# Patient Record
Sex: Female | Born: 1935 | Race: White | Hispanic: No | Smoking: Current every day smoker
Health system: Southern US, Community
[De-identification: ages and names within clinical notes are randomized; demographics above are authoritative.]

## PROBLEM LIST (undated history)

## (undated) DIAGNOSIS — I739 Peripheral vascular disease, unspecified: Secondary | ICD-10-CM

## (undated) DIAGNOSIS — Z8709 Personal history of other diseases of the respiratory system: Secondary | ICD-10-CM

## (undated) DIAGNOSIS — S069X9A Unspecified intracranial injury with loss of consciousness of unspecified duration, initial encounter: Secondary | ICD-10-CM

## (undated) DIAGNOSIS — J189 Pneumonia, unspecified organism: Secondary | ICD-10-CM

## (undated) DIAGNOSIS — Z950 Presence of cardiac pacemaker: Secondary | ICD-10-CM

## (undated) DIAGNOSIS — F419 Anxiety disorder, unspecified: Secondary | ICD-10-CM

## (undated) DIAGNOSIS — H9190 Unspecified hearing loss, unspecified ear: Secondary | ICD-10-CM

## (undated) DIAGNOSIS — I2510 Atherosclerotic heart disease of native coronary artery without angina pectoris: Secondary | ICD-10-CM

## (undated) DIAGNOSIS — N183 Chronic kidney disease, stage 3 unspecified: Secondary | ICD-10-CM

## (undated) DIAGNOSIS — K219 Gastro-esophageal reflux disease without esophagitis: Secondary | ICD-10-CM

## (undated) DIAGNOSIS — F329 Major depressive disorder, single episode, unspecified: Secondary | ICD-10-CM

## (undated) DIAGNOSIS — E1129 Type 2 diabetes mellitus with other diabetic kidney complication: Secondary | ICD-10-CM

## (undated) DIAGNOSIS — I779 Disorder of arteries and arterioles, unspecified: Secondary | ICD-10-CM

## (undated) DIAGNOSIS — Z8719 Personal history of other diseases of the digestive system: Secondary | ICD-10-CM

## (undated) DIAGNOSIS — I10 Essential (primary) hypertension: Secondary | ICD-10-CM

## (undated) DIAGNOSIS — J449 Chronic obstructive pulmonary disease, unspecified: Secondary | ICD-10-CM

## (undated) DIAGNOSIS — M1990 Unspecified osteoarthritis, unspecified site: Secondary | ICD-10-CM

## (undated) DIAGNOSIS — E785 Hyperlipidemia, unspecified: Secondary | ICD-10-CM

## (undated) DIAGNOSIS — F32A Depression, unspecified: Secondary | ICD-10-CM

## (undated) DIAGNOSIS — R0600 Dyspnea, unspecified: Secondary | ICD-10-CM

## (undated) DIAGNOSIS — I4819 Other persistent atrial fibrillation: Secondary | ICD-10-CM

## (undated) DIAGNOSIS — I5032 Chronic diastolic (congestive) heart failure: Secondary | ICD-10-CM

## (undated) DIAGNOSIS — K5900 Constipation, unspecified: Secondary | ICD-10-CM

## (undated) HISTORY — PX: OTHER SURGICAL HISTORY: SHX169

## (undated) HISTORY — PX: APPENDECTOMY: SHX54

## (undated) HISTORY — PX: JOINT REPLACEMENT: SHX530

## (undated) HISTORY — PX: CAROTID ENDARTERECTOMY: SUR193

## (undated) HISTORY — PX: KNEE SURGERY: SHX244

## (undated) HISTORY — PX: CORONARY ARTERY BYPASS GRAFT: SHX141

## (undated) HISTORY — DX: Other persistent atrial fibrillation: I48.19

## (undated) HISTORY — PX: CHOLECYSTECTOMY: SHX55

## (undated) HISTORY — PX: INSERT / REPLACE / REMOVE PACEMAKER: SUR710

## (undated) HISTORY — DX: Type 2 diabetes mellitus with other diabetic kidney complication: E11.29

## (undated) HISTORY — DX: Chronic diastolic (congestive) heart failure: I50.32

## (undated) HISTORY — PX: ABDOMINAL HYSTERECTOMY: SHX81

## (undated) HISTORY — PX: BREAST SURGERY: SHX581

## (undated) HISTORY — PX: HIP SURGERY: SHX245

## (undated) HISTORY — PX: CARDIAC CATHETERIZATION: SHX172

---

## 2003-03-03 DIAGNOSIS — S069X1A Unspecified intracranial injury with loss of consciousness of 30 minutes or less, initial encounter: Secondary | ICD-10-CM

## 2003-03-03 DIAGNOSIS — S069X9A Unspecified intracranial injury with loss of consciousness of unspecified duration, initial encounter: Secondary | ICD-10-CM

## 2003-03-03 HISTORY — PX: ORIF WRIST FRACTURE: SHX2133

## 2003-03-03 HISTORY — DX: Unspecified intracranial injury with loss of consciousness of 30 minutes or less, initial encounter: S06.9X1A

## 2003-03-03 HISTORY — PX: TOTAL HIP ARTHROPLASTY: SHX124

## 2003-03-03 HISTORY — DX: Unspecified intracranial injury with loss of consciousness of unspecified duration, initial encounter: S06.9X9A

## 2004-03-02 HISTORY — PX: OTHER SURGICAL HISTORY: SHX169

## 2004-03-02 HISTORY — PX: HIP ARTHROPLASTY: SHX981

## 2004-04-30 ENCOUNTER — Emergency Department: Payer: Self-pay | Admitting: Emergency Medicine

## 2013-03-02 HISTORY — PX: EYE SURGERY: SHX253

## 2014-03-15 DIAGNOSIS — Z79891 Long term (current) use of opiate analgesic: Secondary | ICD-10-CM | POA: Diagnosis not present

## 2014-03-15 DIAGNOSIS — I129 Hypertensive chronic kidney disease with stage 1 through stage 4 chronic kidney disease, or unspecified chronic kidney disease: Secondary | ICD-10-CM | POA: Diagnosis not present

## 2014-03-15 DIAGNOSIS — M47816 Spondylosis without myelopathy or radiculopathy, lumbar region: Secondary | ICD-10-CM | POA: Diagnosis not present

## 2014-03-15 DIAGNOSIS — Z1231 Encounter for screening mammogram for malignant neoplasm of breast: Secondary | ICD-10-CM | POA: Diagnosis not present

## 2014-03-15 DIAGNOSIS — E119 Type 2 diabetes mellitus without complications: Secondary | ICD-10-CM | POA: Diagnosis not present

## 2014-03-21 DIAGNOSIS — Z1231 Encounter for screening mammogram for malignant neoplasm of breast: Secondary | ICD-10-CM | POA: Diagnosis not present

## 2014-04-12 DIAGNOSIS — I480 Paroxysmal atrial fibrillation: Secondary | ICD-10-CM | POA: Diagnosis not present

## 2014-04-12 DIAGNOSIS — N183 Chronic kidney disease, stage 3 (moderate): Secondary | ICD-10-CM | POA: Diagnosis not present

## 2014-04-12 DIAGNOSIS — Z950 Presence of cardiac pacemaker: Secondary | ICD-10-CM | POA: Diagnosis not present

## 2014-06-14 DIAGNOSIS — N183 Chronic kidney disease, stage 3 (moderate): Secondary | ICD-10-CM | POA: Diagnosis not present

## 2014-06-14 DIAGNOSIS — I10 Essential (primary) hypertension: Secondary | ICD-10-CM | POA: Diagnosis not present

## 2014-06-14 DIAGNOSIS — R809 Proteinuria, unspecified: Secondary | ICD-10-CM | POA: Diagnosis not present

## 2014-06-25 DIAGNOSIS — M47816 Spondylosis without myelopathy or radiculopathy, lumbar region: Secondary | ICD-10-CM | POA: Diagnosis not present

## 2014-06-25 DIAGNOSIS — I10 Essential (primary) hypertension: Secondary | ICD-10-CM | POA: Diagnosis not present

## 2014-06-25 DIAGNOSIS — I739 Peripheral vascular disease, unspecified: Secondary | ICD-10-CM | POA: Diagnosis not present

## 2014-06-25 DIAGNOSIS — I2510 Atherosclerotic heart disease of native coronary artery without angina pectoris: Secondary | ICD-10-CM | POA: Diagnosis not present

## 2014-06-25 DIAGNOSIS — F419 Anxiety disorder, unspecified: Secondary | ICD-10-CM | POA: Diagnosis not present

## 2014-06-25 DIAGNOSIS — N183 Chronic kidney disease, stage 3 (moderate): Secondary | ICD-10-CM | POA: Diagnosis not present

## 2014-06-25 DIAGNOSIS — I129 Hypertensive chronic kidney disease with stage 1 through stage 4 chronic kidney disease, or unspecified chronic kidney disease: Secondary | ICD-10-CM | POA: Diagnosis not present

## 2014-06-25 DIAGNOSIS — E119 Type 2 diabetes mellitus without complications: Secondary | ICD-10-CM | POA: Diagnosis not present

## 2014-06-25 DIAGNOSIS — K219 Gastro-esophageal reflux disease without esophagitis: Secondary | ICD-10-CM | POA: Diagnosis not present

## 2014-06-25 DIAGNOSIS — I480 Paroxysmal atrial fibrillation: Secondary | ICD-10-CM | POA: Diagnosis not present

## 2014-06-25 DIAGNOSIS — Z79891 Long term (current) use of opiate analgesic: Secondary | ICD-10-CM | POA: Diagnosis not present

## 2014-06-25 DIAGNOSIS — E785 Hyperlipidemia, unspecified: Secondary | ICD-10-CM | POA: Diagnosis not present

## 2014-07-12 DIAGNOSIS — Z951 Presence of aortocoronary bypass graft: Secondary | ICD-10-CM | POA: Diagnosis not present

## 2014-07-12 DIAGNOSIS — I2510 Atherosclerotic heart disease of native coronary artery without angina pectoris: Secondary | ICD-10-CM | POA: Diagnosis not present

## 2014-07-12 DIAGNOSIS — I779 Disorder of arteries and arterioles, unspecified: Secondary | ICD-10-CM | POA: Diagnosis not present

## 2014-07-12 DIAGNOSIS — I480 Paroxysmal atrial fibrillation: Secondary | ICD-10-CM | POA: Diagnosis not present

## 2014-07-12 DIAGNOSIS — I10 Essential (primary) hypertension: Secondary | ICD-10-CM | POA: Diagnosis not present

## 2014-07-12 DIAGNOSIS — E785 Hyperlipidemia, unspecified: Secondary | ICD-10-CM | POA: Diagnosis not present

## 2014-07-26 DIAGNOSIS — I480 Paroxysmal atrial fibrillation: Secondary | ICD-10-CM | POA: Diagnosis not present

## 2014-07-26 DIAGNOSIS — Z950 Presence of cardiac pacemaker: Secondary | ICD-10-CM | POA: Diagnosis not present

## 2014-07-27 DIAGNOSIS — H1859 Other hereditary corneal dystrophies: Secondary | ICD-10-CM | POA: Diagnosis not present

## 2014-08-30 DIAGNOSIS — I10 Essential (primary) hypertension: Secondary | ICD-10-CM | POA: Diagnosis not present

## 2014-08-30 DIAGNOSIS — I480 Paroxysmal atrial fibrillation: Secondary | ICD-10-CM | POA: Diagnosis not present

## 2014-08-30 DIAGNOSIS — Z951 Presence of aortocoronary bypass graft: Secondary | ICD-10-CM | POA: Diagnosis not present

## 2014-08-30 DIAGNOSIS — I2510 Atherosclerotic heart disease of native coronary artery without angina pectoris: Secondary | ICD-10-CM | POA: Diagnosis not present

## 2014-08-30 DIAGNOSIS — Z950 Presence of cardiac pacemaker: Secondary | ICD-10-CM | POA: Diagnosis not present

## 2014-08-30 DIAGNOSIS — I779 Disorder of arteries and arterioles, unspecified: Secondary | ICD-10-CM | POA: Diagnosis not present

## 2014-08-30 DIAGNOSIS — E785 Hyperlipidemia, unspecified: Secondary | ICD-10-CM | POA: Diagnosis not present

## 2014-08-30 DIAGNOSIS — T887XXA Unspecified adverse effect of drug or medicament, initial encounter: Secondary | ICD-10-CM | POA: Diagnosis not present

## 2014-08-30 DIAGNOSIS — Z79899 Other long term (current) drug therapy: Secondary | ICD-10-CM | POA: Diagnosis not present

## 2014-09-24 DIAGNOSIS — M47816 Spondylosis without myelopathy or radiculopathy, lumbar region: Secondary | ICD-10-CM | POA: Diagnosis not present

## 2014-09-24 DIAGNOSIS — N3281 Overactive bladder: Secondary | ICD-10-CM | POA: Diagnosis not present

## 2014-09-24 DIAGNOSIS — Z79891 Long term (current) use of opiate analgesic: Secondary | ICD-10-CM | POA: Diagnosis not present

## 2014-09-24 DIAGNOSIS — R809 Proteinuria, unspecified: Secondary | ICD-10-CM | POA: Diagnosis not present

## 2014-09-24 DIAGNOSIS — I10 Essential (primary) hypertension: Secondary | ICD-10-CM | POA: Diagnosis not present

## 2014-09-24 DIAGNOSIS — I129 Hypertensive chronic kidney disease with stage 1 through stage 4 chronic kidney disease, or unspecified chronic kidney disease: Secondary | ICD-10-CM | POA: Diagnosis not present

## 2014-09-27 DIAGNOSIS — R809 Proteinuria, unspecified: Secondary | ICD-10-CM | POA: Diagnosis not present

## 2014-10-20 DIAGNOSIS — I10 Essential (primary) hypertension: Secondary | ICD-10-CM | POA: Diagnosis not present

## 2014-10-20 DIAGNOSIS — S52515A Nondisplaced fracture of left radial styloid process, initial encounter for closed fracture: Secondary | ICD-10-CM | POA: Diagnosis not present

## 2014-10-20 DIAGNOSIS — Z950 Presence of cardiac pacemaker: Secondary | ICD-10-CM | POA: Diagnosis not present

## 2014-10-20 DIAGNOSIS — Z951 Presence of aortocoronary bypass graft: Secondary | ICD-10-CM | POA: Diagnosis not present

## 2014-10-20 DIAGNOSIS — S52615A Nondisplaced fracture of left ulna styloid process, initial encounter for closed fracture: Secondary | ICD-10-CM | POA: Diagnosis not present

## 2014-10-20 DIAGNOSIS — E785 Hyperlipidemia, unspecified: Secondary | ICD-10-CM | POA: Diagnosis not present

## 2014-10-20 DIAGNOSIS — S52609A Unspecified fracture of lower end of unspecified ulna, initial encounter for closed fracture: Secondary | ICD-10-CM | POA: Diagnosis not present

## 2014-10-20 DIAGNOSIS — R531 Weakness: Secondary | ICD-10-CM | POA: Diagnosis not present

## 2014-10-20 DIAGNOSIS — S52602A Unspecified fracture of lower end of left ulna, initial encounter for closed fracture: Secondary | ICD-10-CM | POA: Diagnosis not present

## 2014-10-26 DIAGNOSIS — S52532A Colles' fracture of left radius, initial encounter for closed fracture: Secondary | ICD-10-CM | POA: Diagnosis not present

## 2014-10-30 ENCOUNTER — Encounter (HOSPITAL_COMMUNITY): Payer: Self-pay | Admitting: *Deleted

## 2014-10-30 MED ORDER — CEFAZOLIN SODIUM-DEXTROSE 2-3 GM-% IV SOLR
2.0000 g | INTRAVENOUS | Status: AC
  Administered 2014-10-31: 2 g via INTRAVENOUS
  Filled 2014-10-30: qty 50

## 2014-10-30 MED ORDER — CHLORHEXIDINE GLUCONATE 4 % EX LIQD: 60.0000 mL | Freq: Once | CUTANEOUS | Status: DC

## 2014-10-30 NOTE — Progress Notes (Signed)
Notified patient of time change and informed patient that she need to arrive at 1300.  Patient verbalized understanding and stated she would inform her daughter.

## 2014-10-30 NOTE — Progress Notes (Signed)
Patient's daughter [name], reported that patient was not given any instructions on stopping Eliquis.  I called Dr Lequita Asal office and was instructed that patient does not need to stop Eliquis , "having light sedation and needs to stay on medication for pacemaker."

## 2014-10-30 NOTE — Progress Notes (Signed)
Patient's daughter [name], reported that patient was not given any instructions on stopping Eliquis.  I called Dr Lequita Asal office and was instructed that patient does not need to stop Eliquis , "having light sedation and needs to stay on medication for pacemaker."  I left a message on Jackie's voice mail with the information from Dr Apolonio Schneiders concerning Eliquis.

## 2014-10-30 NOTE — Progress Notes (Signed)
Anesthesia Chart Review: SAME DAY WORK-UP.  Patient is a 79 year old female scheduled for ORIF left distal radial fracture on 10/31/14 by Dr. Caralyn Guile. Anesthesia requested as: Axillary block/IV sedation.  Patient is from [place], [place], but was brought to [place] for surgery to be near her daughter [name].   PCP is Dr. Tommy Rainwater. Cardiologist is Dr. Kellie Simmering ([place]), last visit 08/30/14. Both of [place], [place].  History includes smoking, CAD/MI s/p PCI to RCA '01 and CABG (LIMA to LAD, SVG to OM1 with modified MAZE procedure) 10/09/04, SSS s/p dual chamber Medtronic PPM 07/29/11, PAF, CHF, chronic chest pain, carotid artery disease s/p left CEA ~ '02 and right CEA '13, HTN, COPD, "borderline" DM, CKD, anxiety, GERD, hard of hearing, HLD, right THA ~ '05 complicated by infection s/p removal.   Meds include amlodipine, Eliquis (not to stop perioperatively per Dr. Caralyn Guile), fosinopril, Norco, Imdur, Ativan, Toprol XL. Multaq was discontinued by Dr. Bunnie Philips at her 08/30/14 appointment due to patient intolerance (diarrhea, insurance, and conversion to NSR after two weeks on Multaq).   07/12/14 EKG: V-paced rhythm. Left BBB pattern.   According to Dr. Miki Kins 08/30/14 last office note, he would consider admission for sotalol initiation in the future if she developed symptomatic afib.   04/04/13 Nuclear stress test [facility]): Probably normal Lexiscan Myoview study. Fixed anterior defect is most likely breast attenuation. Preserved systolic function, EF [id number]. Considering the significant hypotension and ST changes, balance ischemia is likely. Please correlate clinically. (Referred for LHC, see below.)   04/19/13 LHC [facility]):  1. 3V CAD. (50% ostial LM. Severe proximal LAD disease. After a very small D1, there is 90% stenosis. Distal filling by the LIMA graft. Proximal 50% CX stenosis at the origin of a very small OM1. At the origin of the OM2 there is 50% stenosis in the native  CX and an ostial 80% stenosis of the OM2. Competitive filling in the OM is noted via the venous graft. CX then proceeds as a moderate size vessel with mild luminal irregularities and generates a fairly large OM3. 60-70% ostial RCA and diffuse luminal irregularities in the mid vessel but no focal obstructive lesions beyond the ostium. Distal filling is normal. The previously deployed stent is not visualized but it is patent.)  2. Two out of two grafts patent. SVG to OM is widely patent with normal distal filling. LIMA to LAD is patent with normal distal flow. There is 50% stenosis in the native LAD just at the anastomosis site.  3. Patent RCA stent. 4. Moderate lesions in the ostium of the left main, ostium of the RCA, and ostium of the OM2. Comments: On the basis of the above findings, on-going medical therapy is likely optimal. The images will be forwarded to an interventionalist for review and consideration for potential stent deployment to the LM and ostium of the RCA.   05/04/13 Cardiac Cath with FFR [facility]): Right dominant. 50% ostial LM. Based on flow reserve of 0/97, the lesion was judged to not be significant. 60% ostial RCA. Based of low reserve of 0.89 lesion was judged to be insignificant. Recommendations: Medical therapy recommended per Dr. Baird Lyons and Dr. Matthew Saras.   03/31/13 Echo [facility]): No LV wall motion abnormalities. Preserved LV systolic function. LVEF 60-70%. Moderate concentric LVH. Normal RV size and function. Dilated LA. AV sclerosis. Mild TR. Normal right heart pressure. Compared to prior 06/15/11 study, LV diastolic function has worsened.   03/30/13 Carotid duplex (  Dr. Bunnie Philips): < 50% BICA stenosis.  Records received from Dr. Tommy Rainwater. She was last seen on 09/24/14. A1C 6.0. Cr 1.11. H/H 12.6/36.4.  Discussed above with anesthesiologist Dr. [place] Callery. Patient cannot have an axillary block while still on Eliquis. Patient would either need to undergo general anesthesia or  delay surgery in order to hold her Eliquis for three days prior to surgery date. I have communicated this with Dr. Angus Palms staff. She will notify him for recommendations. I am leaving for the day, but I gave her Dr. Everlene Other and Willeen Cass, FNP-BC phone number if Dr. Caralyn Guile needs to discuss further. She will need labs prior to surgery, and a repeat EKG to evaluate her underlying rhythm.  George Hugh [facility] Short Stay Center/Anesthesiology Phone [phone] 10/30/2014 2:27 PM

## 2014-10-30 NOTE — Progress Notes (Signed)
Wanda Brooks is a [place] resident, she suffered a fall and fractured left radius.  Wanda Brooks' daughter brought her to [facility] to stay with her. Patient will be d/ced to her daughter, Wanda Brooks home.  Wanda Brooks has a history of CABG, Afib< CHF, has a pacemaker.  Patient has a history of chest pressure for years and takes NTG and it goes away.  Patient 's cardiologist is Dr Bunnie Philips in [facility] and PCP is Dr Tommy Rainwater also in [place].  I faxed request to their offices requesting records.

## 2014-10-31 ENCOUNTER — Encounter (HOSPITAL_COMMUNITY): Payer: Self-pay | Admitting: General Practice

## 2014-10-31 ENCOUNTER — Encounter (HOSPITAL_COMMUNITY)
Admission: RE | Disposition: A | Discharge status: Home / Self Care | Payer: Self-pay | Source: Ambulatory Visit | Attending: Orthopedic Surgery

## 2014-10-31 ENCOUNTER — Ambulatory Visit (HOSPITAL_COMMUNITY)
Admission: RE | Admit: 2014-10-31 | Discharge: 2014-11-01 | Disposition: A | Discharge status: Home / Self Care | Payer: Medicare Other | Source: Ambulatory Visit | Attending: Orthopedic Surgery | Admitting: Orthopedic Surgery

## 2014-10-31 ENCOUNTER — Ambulatory Visit (HOSPITAL_COMMUNITY): Payer: Medicare Other | Admitting: Vascular Surgery

## 2014-10-31 DIAGNOSIS — I129 Hypertensive chronic kidney disease with stage 1 through stage 4 chronic kidney disease, or unspecified chronic kidney disease: Secondary | ICD-10-CM | POA: Diagnosis not present

## 2014-10-31 DIAGNOSIS — S52572A Other intraarticular fracture of lower end of left radius, initial encounter for closed fracture: Secondary | ICD-10-CM | POA: Insufficient documentation

## 2014-10-31 DIAGNOSIS — Z950 Presence of cardiac pacemaker: Secondary | ICD-10-CM | POA: Insufficient documentation

## 2014-10-31 DIAGNOSIS — F17210 Nicotine dependence, cigarettes, uncomplicated: Secondary | ICD-10-CM | POA: Diagnosis not present

## 2014-10-31 DIAGNOSIS — N189 Chronic kidney disease, unspecified: Secondary | ICD-10-CM | POA: Diagnosis not present

## 2014-10-31 DIAGNOSIS — I509 Heart failure, unspecified: Secondary | ICD-10-CM | POA: Diagnosis not present

## 2014-10-31 DIAGNOSIS — S52502A Unspecified fracture of the lower end of left radius, initial encounter for closed fracture: Secondary | ICD-10-CM | POA: Diagnosis present

## 2014-10-31 DIAGNOSIS — S52532A Colles' fracture of left radius, initial encounter for closed fracture: Secondary | ICD-10-CM | POA: Diagnosis not present

## 2014-10-31 DIAGNOSIS — Z7901 Long term (current) use of anticoagulants: Secondary | ICD-10-CM | POA: Insufficient documentation

## 2014-10-31 DIAGNOSIS — E785 Hyperlipidemia, unspecified: Secondary | ICD-10-CM | POA: Insufficient documentation

## 2014-10-31 DIAGNOSIS — W19XXXA Unspecified fall, initial encounter: Secondary | ICD-10-CM | POA: Insufficient documentation

## 2014-10-31 DIAGNOSIS — I2510 Atherosclerotic heart disease of native coronary artery without angina pectoris: Secondary | ICD-10-CM | POA: Diagnosis not present

## 2014-10-31 DIAGNOSIS — I252 Old myocardial infarction: Secondary | ICD-10-CM | POA: Insufficient documentation

## 2014-10-31 DIAGNOSIS — J449 Chronic obstructive pulmonary disease, unspecified: Secondary | ICD-10-CM | POA: Diagnosis not present

## 2014-10-31 DIAGNOSIS — E119 Type 2 diabetes mellitus without complications: Secondary | ICD-10-CM | POA: Insufficient documentation

## 2014-10-31 DIAGNOSIS — I4891 Unspecified atrial fibrillation: Secondary | ICD-10-CM | POA: Insufficient documentation

## 2014-10-31 HISTORY — DX: Unspecified hearing loss, unspecified ear: H91.90

## 2014-10-31 HISTORY — DX: Gastro-esophageal reflux disease without esophagitis: K21.9

## 2014-10-31 HISTORY — DX: Chronic obstructive pulmonary disease, unspecified: J44.9

## 2014-10-31 HISTORY — DX: Hyperlipidemia, unspecified: E78.5

## 2014-10-31 HISTORY — DX: Unspecified intracranial injury with loss of consciousness of unspecified duration, initial encounter: S06.9X9A

## 2014-10-31 HISTORY — DX: Constipation, unspecified: K59.00

## 2014-10-31 HISTORY — DX: Presence of cardiac pacemaker: Z95.0

## 2014-10-31 HISTORY — DX: Unspecified osteoarthritis, unspecified site: M19.90

## 2014-10-31 HISTORY — DX: Anxiety disorder, unspecified: F41.9

## 2014-10-31 HISTORY — DX: Essential (primary) hypertension: I10

## 2014-10-31 HISTORY — PX: OPEN REDUCTION INTERNAL FIXATION (ORIF) DISTAL RADIAL FRACTURE: SHX5989

## 2014-10-31 LAB — GLUCOSE, CAPILLARY: GLUCOSE-CAPILLARY: 110 mg/dL — AB (ref 65–99)

## 2014-10-31 LAB — BASIC METABOLIC PANEL
ANION GAP: 9 (ref 5–15)
BUN: 29 mg/dL — ABNORMAL HIGH (ref 6–20)
CALCIUM: 9.8 mg/dL (ref 8.9–10.3)
CO2: 25 mmol/L (ref 22–32)
Chloride: 106 mmol/L (ref 101–111)
Creatinine, Ser: 1.25 mg/dL — ABNORMAL HIGH (ref 0.44–1.00)
GFR, EST AFRICAN AMERICAN: 47 mL/min — AB (ref >60)
GFR, EST NON AFRICAN AMERICAN: 40 mL/min — AB (ref >60)
GLUCOSE: 116 mg/dL — AB (ref 65–99)
Potassium: 4.8 mmol/L (ref 3.5–5.1)
Sodium: 140 mmol/L (ref 135–145)

## 2014-10-31 LAB — CBC
HCT: 39.1 % (ref 36.0–46.0)
Hemoglobin: 13.3 g/dL (ref 12.0–15.0)
MCH: 31.6 pg (ref 26.0–34.0)
MCHC: 34 g/dL (ref 30.0–36.0)
MCV: 92.9 fL (ref 78.0–100.0)
PLATELETS: 236 10*3/uL (ref 150–400)
RBC: 4.21 MIL/uL (ref 3.87–5.11)
RDW: 13.5 % (ref 11.5–15.5)
WBC: 10.6 10*3/uL — AB (ref 4.0–10.5)

## 2014-10-31 LAB — PROTIME-INR
INR: 1.18 (ref 0.00–1.49)
Prothrombin Time: 15.2 seconds (ref 11.6–15.2)

## 2014-10-31 SURGERY — OPEN REDUCTION INTERNAL FIXATION (ORIF) DISTAL RADIUS FRACTURE
Anesthesia: General | Site: Arm Lower | Laterality: Left

## 2014-10-31 MED ORDER — PSYLLIUM 95 % PO PACK
1.0000 | PACK | Freq: Two times a day (BID) | ORAL | Status: DC
  Administered 2014-11-01: 1 via ORAL
  Filled 2014-10-31 (×4): qty 1

## 2014-10-31 MED ORDER — FENTANYL CITRATE (PF) 100 MCG/2ML IJ SOLN
INTRAMUSCULAR | Status: AC
  Administered 2014-10-31: 50 ug via INTRAVENOUS
  Filled 2014-10-31: qty 2

## 2014-10-31 MED ORDER — METHOCARBAMOL 1000 MG/10ML IJ SOLN
500.0000 mg | Freq: Four times a day (QID) | INTRAVENOUS | Status: DC | PRN
  Filled 2014-10-31: qty 5

## 2014-10-31 MED ORDER — PSYLLIUM 0.52 G PO CAPS: 0.5200 g | ORAL_CAPSULE | Freq: Two times a day (BID) | ORAL | Status: DC

## 2014-10-31 MED ORDER — SENNOSIDES-DOCUSATE SODIUM 8.6-50 MG PO TABS: 1.0000 | ORAL_TABLET | Freq: Every evening | ORAL | Status: DC | PRN

## 2014-10-31 MED ORDER — METHOCARBAMOL 500 MG PO TABS
500.0000 mg | ORAL_TABLET | Freq: Four times a day (QID) | ORAL | Status: DC | PRN
  Administered 2014-10-31: 500 mg via ORAL

## 2014-10-31 MED ORDER — MIDAZOLAM HCL 2 MG/2ML IJ SOLN
INTRAMUSCULAR | Status: AC
  Filled 2014-10-31: qty 4

## 2014-10-31 MED ORDER — CEFAZOLIN SODIUM 1-5 GM-% IV SOLN
1.0000 g | INTRAVENOUS | Status: AC
  Administered 2014-10-31: 1 g via INTRAVENOUS
  Filled 2014-10-31: qty 50

## 2014-10-31 MED ORDER — PROPOFOL 10 MG/ML IV BOLUS
INTRAVENOUS | Status: DC | PRN
  Administered 2014-10-31: 110 mg via INTRAVENOUS

## 2014-10-31 MED ORDER — DOCUSATE SODIUM 100 MG PO CAPS: 100.0000 mg | ORAL_CAPSULE | Freq: Two times a day (BID) | ORAL | Status: DC

## 2014-10-31 MED ORDER — DIPHENHYDRAMINE HCL 25 MG PO CAPS: 25.0000 mg | ORAL_CAPSULE | Freq: Four times a day (QID) | ORAL | Status: DC | PRN

## 2014-10-31 MED ORDER — METOPROLOL SUCCINATE ER 50 MG PO TB24
100.0000 mg | ORAL_TABLET | Freq: Every day | ORAL | Status: DC
  Administered 2014-11-01: 100 mg via ORAL
  Filled 2014-10-31: qty 2

## 2014-10-31 MED ORDER — METOPROLOL SUCCINATE ER 50 MG PO TB24
50.0000 mg | ORAL_TABLET | Freq: Every day | ORAL | Status: DC
  Administered 2014-10-31: 50 mg via ORAL
  Filled 2014-10-31: qty 1

## 2014-10-31 MED ORDER — OXYCODONE-ACETAMINOPHEN 10-325 MG PO TABS: 1.0000 | ORAL_TABLET | ORAL | Status: DC | PRN

## 2014-10-31 MED ORDER — CEFAZOLIN SODIUM 1-5 GM-% IV SOLN
1.0000 g | Freq: Three times a day (TID) | INTRAVENOUS | Status: DC
  Administered 2014-11-01 (×2): 1 g via INTRAVENOUS
  Filled 2014-10-31 (×4): qty 50

## 2014-10-31 MED ORDER — BUPIVACAINE HCL (PF) 0.25 % IJ SOLN
INTRAMUSCULAR | Status: AC
  Filled 2014-10-31: qty 30

## 2014-10-31 MED ORDER — ONDANSETRON HCL 4 MG/2ML IJ SOLN: 4.0000 mg | Freq: Four times a day (QID) | INTRAMUSCULAR | Status: DC | PRN

## 2014-10-31 MED ORDER — PROPOFOL 10 MG/ML IV BOLUS
INTRAVENOUS | Status: AC
  Filled 2014-10-31: qty 20

## 2014-10-31 MED ORDER — LISINOPRIL 40 MG PO TABS
40.0000 mg | ORAL_TABLET | Freq: Every day | ORAL | Status: DC
  Administered 2014-11-01: 40 mg via ORAL
  Filled 2014-10-31: qty 1

## 2014-10-31 MED ORDER — FENTANYL CITRATE (PF) 100 MCG/2ML IJ SOLN
25.0000 ug | INTRAMUSCULAR | Status: DC | PRN
  Administered 2014-10-31 (×3): 50 ug via INTRAVENOUS

## 2014-10-31 MED ORDER — FENTANYL CITRATE (PF) 100 MCG/2ML IJ SOLN
INTRAMUSCULAR | Status: DC | PRN
  Administered 2014-10-31 (×5): 50 ug via INTRAVENOUS

## 2014-10-31 MED ORDER — LORAZEPAM 0.5 MG PO TABS
0.5000 mg | ORAL_TABLET | Freq: Three times a day (TID) | ORAL | Status: DC | PRN
  Administered 2014-10-31 – 2014-11-01 (×2): 0.5 mg via ORAL
  Filled 2014-10-31 (×2): qty 1

## 2014-10-31 MED ORDER — METHOCARBAMOL 500 MG PO TABS
ORAL_TABLET | ORAL | Status: AC
  Administered 2014-10-31: 500 mg via ORAL
  Filled 2014-10-31: qty 1

## 2014-10-31 MED ORDER — ISOSORBIDE MONONITRATE ER 60 MG PO TB24
120.0000 mg | ORAL_TABLET | Freq: Every day | ORAL | Status: DC
  Administered 2014-11-01: 120 mg via ORAL
  Filled 2014-10-31: qty 2

## 2014-10-31 MED ORDER — METHOCARBAMOL 500 MG PO TABS: 500.0000 mg | ORAL_TABLET | Freq: Four times a day (QID) | ORAL | Status: DC

## 2014-10-31 MED ORDER — PROMETHAZINE HCL 25 MG/ML IJ SOLN: 6.2500 mg | INTRAMUSCULAR | Status: DC | PRN

## 2014-10-31 MED ORDER — LIDOCAINE HCL (CARDIAC) 20 MG/ML IV SOLN
INTRAVENOUS | Status: DC | PRN
  Administered 2014-10-31: 70 mg via INTRAVENOUS

## 2014-10-31 MED ORDER — ADULT MULTIVITAMIN W/MINERALS CH
1.0000 | ORAL_TABLET | Freq: Every day | ORAL | Status: DC
  Administered 2014-10-31 – 2014-11-01 (×2): 1 via ORAL
  Filled 2014-10-31 (×2): qty 1

## 2014-10-31 MED ORDER — ISOSORBIDE MONONITRATE ER 60 MG PO TB24: 120.0000 mg | ORAL_TABLET | Freq: Every day | ORAL | Status: DC

## 2014-10-31 MED ORDER — VITAMIN C 500 MG PO TABS: 500.0000 mg | ORAL_TABLET | Freq: Every day | ORAL | Status: DC

## 2014-10-31 MED ORDER — DOCUSATE SODIUM 100 MG PO CAPS
100.0000 mg | ORAL_CAPSULE | Freq: Two times a day (BID) | ORAL | Status: DC
Start: 2014-10-31 — End: 2015-10-28

## 2014-10-31 MED ORDER — MORPHINE SULFATE (PF) 2 MG/ML IV SOLN
1.0000 mg | INTRAVENOUS | Status: DC | PRN
  Administered 2014-10-31 – 2014-11-01 (×3): 1 mg via INTRAVENOUS
  Filled 2014-10-31 (×3): qty 1

## 2014-10-31 MED ORDER — LIDOCAINE HCL (CARDIAC) 20 MG/ML IV SOLN
INTRAVENOUS | Status: AC
  Filled 2014-10-31: qty 25

## 2014-10-31 MED ORDER — ONDANSETRON HCL 4 MG PO TABS: 4.0000 mg | ORAL_TABLET | Freq: Four times a day (QID) | ORAL | Status: DC | PRN

## 2014-10-31 MED ORDER — PHENYLEPHRINE 40 MCG/ML (10ML) SYRINGE FOR IV PUSH (FOR BLOOD PRESSURE SUPPORT)
PREFILLED_SYRINGE | INTRAVENOUS | Status: AC
  Filled 2014-10-31: qty 20

## 2014-10-31 MED ORDER — SUCCINYLCHOLINE CHLORIDE 20 MG/ML IJ SOLN
INTRAMUSCULAR | Status: AC
  Filled 2014-10-31: qty 1

## 2014-10-31 MED ORDER — VITAMIN C 500 MG PO TABS
1000.0000 mg | ORAL_TABLET | Freq: Every day | ORAL | Status: DC
  Administered 2014-10-31 – 2014-11-01 (×2): 1000 mg via ORAL
  Filled 2014-10-31 (×2): qty 2

## 2014-10-31 MED ORDER — AMLODIPINE BESYLATE 10 MG PO TABS
10.0000 mg | ORAL_TABLET | Freq: Every day | ORAL | Status: DC
  Administered 2014-11-01: 10 mg via ORAL
  Filled 2014-10-31: qty 1

## 2014-10-31 MED ORDER — DOCUSATE SODIUM 100 MG PO CAPS
100.0000 mg | ORAL_CAPSULE | Freq: Two times a day (BID) | ORAL | Status: DC
  Administered 2014-10-31 – 2014-11-01 (×2): 100 mg via ORAL
  Filled 2014-10-31 (×2): qty 1

## 2014-10-31 MED ORDER — LACTATED RINGERS IV SOLN
INTRAVENOUS | Status: DC
  Administered 2014-10-31: 14:00:00 via INTRAVENOUS

## 2014-10-31 MED ORDER — MIDAZOLAM HCL 5 MG/5ML IJ SOLN
INTRAMUSCULAR | Status: DC | PRN
  Administered 2014-10-31: 2 mg via INTRAVENOUS

## 2014-10-31 MED ORDER — HYDROCODONE-ACETAMINOPHEN 5-325 MG PO TABS
1.0000 | ORAL_TABLET | ORAL | Status: DC | PRN
  Administered 2014-10-31 – 2014-11-01 (×5): 2 via ORAL
  Filled 2014-10-31 (×5): qty 2

## 2014-10-31 MED ORDER — ONDANSETRON HCL 4 MG/2ML IJ SOLN
INTRAMUSCULAR | Status: AC
  Filled 2014-10-31: qty 10

## 2014-10-31 MED ORDER — OXYCODONE-ACETAMINOPHEN 5-325 MG PO TABS
ORAL_TABLET | ORAL | Status: AC
  Administered 2014-10-31: 2 via ORAL
  Filled 2014-10-31: qty 2

## 2014-10-31 MED ORDER — ATORVASTATIN CALCIUM 20 MG PO TABS
20.0000 mg | ORAL_TABLET | Freq: Every evening | ORAL | Status: DC
  Administered 2014-10-31 – 2014-11-01 (×2): 20 mg via ORAL
  Filled 2014-10-31: qty 2
  Filled 2014-10-31 (×2): qty 1

## 2014-10-31 MED ORDER — KCL IN DEXTROSE-NACL 20-5-0.45 MEQ/L-%-% IV SOLN
INTRAVENOUS | Status: DC
  Administered 2014-10-31: 20:00:00 via INTRAVENOUS
  Filled 2014-10-31: qty 1000

## 2014-10-31 MED ORDER — APIXABAN 5 MG PO TABS
5.0000 mg | ORAL_TABLET | Freq: Two times a day (BID) | ORAL | Status: DC
  Administered 2014-10-31 – 2014-11-01 (×2): 5 mg via ORAL
  Filled 2014-10-31 (×2): qty 1

## 2014-10-31 MED ORDER — 0.9 % SODIUM CHLORIDE (POUR BTL) OPTIME
TOPICAL | Status: DC | PRN
  Administered 2014-10-31: 1000 mL

## 2014-10-31 MED ORDER — FOSINOPRIL SODIUM 20 MG PO TABS: 40.0000 mg | ORAL_TABLET | Freq: Every day | ORAL | Status: DC

## 2014-10-31 MED ORDER — OXYCODONE-ACETAMINOPHEN 5-325 MG PO TABS
1.0000 | ORAL_TABLET | ORAL | Status: DC | PRN
  Administered 2014-10-31: 2 via ORAL
  Filled 2014-10-31: qty 2

## 2014-10-31 MED ORDER — FENTANYL CITRATE (PF) 250 MCG/5ML IJ SOLN
INTRAMUSCULAR | Status: AC
  Filled 2014-10-31: qty 5

## 2014-10-31 SURGICAL SUPPLY — 64 items
BANDAGE ELASTIC 3 VELCRO ST LF (GAUZE/BANDAGES/DRESSINGS) IMPLANT
BANDAGE ELASTIC 4 VELCRO ST LF (GAUZE/BANDAGES/DRESSINGS) ×6 IMPLANT
BIT DRILL 2.2 SS TIBIAL (BIT) ×3 IMPLANT
BLADE SURG ROTATE 9660 (MISCELLANEOUS) IMPLANT
BNDG ESMARK 4X9 LF (GAUZE/BANDAGES/DRESSINGS) ×3 IMPLANT
BNDG GAUZE ELAST 4 BULKY (GAUZE/BANDAGES/DRESSINGS) ×3 IMPLANT
CANISTER SUCTION 2500CC (MISCELLANEOUS) ×6 IMPLANT
CLOSURE WOUND 1/2 X4 (GAUZE/BANDAGES/DRESSINGS)
CORDS BIPOLAR (ELECTRODE) ×3 IMPLANT
COVER SURGICAL LIGHT HANDLE (MISCELLANEOUS) ×3 IMPLANT
CUFF TOURNIQUET SINGLE 18IN (TOURNIQUET CUFF) ×3 IMPLANT
CUFF TOURNIQUET SINGLE 24IN (TOURNIQUET CUFF) IMPLANT
DRAIN TLS ROUND 10FR (DRAIN) IMPLANT
DRAPE OEC MINIVIEW 54X84 (DRAPES) ×3 IMPLANT
DRAPE SURG 17X11 SM STRL (DRAPES) ×3 IMPLANT
DRSG ADAPTIC 3X8 NADH LF (GAUZE/BANDAGES/DRESSINGS) ×3 IMPLANT
ELECT REM PT RETURN 9FT ADLT (ELECTROSURGICAL)
ELECTRODE REM PT RTRN 9FT ADLT (ELECTROSURGICAL) IMPLANT
GAUZE SPONGE 4X4 12PLY STRL (GAUZE/BANDAGES/DRESSINGS) ×3 IMPLANT
GAUZE SPONGE 4X4 16PLY XRAY LF (GAUZE/BANDAGES/DRESSINGS) ×3 IMPLANT
GLOVE BIOGEL PI IND STRL 8.5 (GLOVE) ×1 IMPLANT
GLOVE BIOGEL PI INDICATOR 8.5 (GLOVE) ×2
GLOVE SURG ORTHO 8.0 STRL STRW (GLOVE) ×3 IMPLANT
GOWN STRL REUS W/ TWL LRG LVL3 (GOWN DISPOSABLE) ×1 IMPLANT
GOWN STRL REUS W/ TWL XL LVL3 (GOWN DISPOSABLE) ×1 IMPLANT
GOWN STRL REUS W/TWL LRG LVL3 (GOWN DISPOSABLE) ×2
GOWN STRL REUS W/TWL XL LVL3 (GOWN DISPOSABLE) ×2
K-WIRE 1.6 (WIRE) ×2
K-WIRE FX5X1.6XNS BN SS (WIRE) ×1
KIT BASIN OR (CUSTOM PROCEDURE TRAY) ×3 IMPLANT
KIT ROOM TURNOVER OR (KITS) ×3 IMPLANT
KWIRE FX5X1.6XNS BN SS (WIRE) ×1 IMPLANT
MANIFOLD NEPTUNE II (INSTRUMENTS) IMPLANT
NEEDLE HYPO 25X1 1.5 SAFETY (NEEDLE) IMPLANT
NS IRRIG 1000ML POUR BTL (IV SOLUTION) ×3 IMPLANT
PACK ORTHO EXTREMITY (CUSTOM PROCEDURE TRAY) ×3 IMPLANT
PAD ARMBOARD 7.5X6 YLW CONV (MISCELLANEOUS) ×6 IMPLANT
PAD CAST 4YDX4 CTTN HI CHSV (CAST SUPPLIES) ×1 IMPLANT
PADDING CAST COTTON 4X4 STRL (CAST SUPPLIES) ×2
PEG LOCKING SMOOTH 2.2X18 (Peg) ×12 IMPLANT
PEG LOCKING SMOOTH 2.2X20 (Screw) ×9 IMPLANT
PLATE STANDARD DVR LEFT (Plate) ×3 IMPLANT
PLATE STD DVR LT 24X51 (Plate) ×1 IMPLANT
SCREW LOCK 14X2.7X 3 LD TPR (Screw) ×4 IMPLANT
SCREW LOCKING 2.7X13MM (Screw) ×3 IMPLANT
SCREW LOCKING 2.7X14 (Screw) ×8 IMPLANT
SOAP 2 % CHG 4 OZ (WOUND CARE) ×3 IMPLANT
SPLINT FIBERGLASS 3X35 (CAST SUPPLIES) ×3 IMPLANT
SPONGE LAP 4X18 X RAY DECT (DISPOSABLE) IMPLANT
STRIP CLOSURE SKIN 1/2X4 (GAUZE/BANDAGES/DRESSINGS) IMPLANT
SUT ETHILON 4 0 PS 2 18 (SUTURE) ×3 IMPLANT
SUT MNCRL AB 4-0 PS2 18 (SUTURE) IMPLANT
SUT VIC AB 2-0 CT1 27 (SUTURE) ×2
SUT VIC AB 2-0 CT1 TAPERPNT 27 (SUTURE) ×1 IMPLANT
SUT VIC AB 2-0 FS1 27 (SUTURE) IMPLANT
SUT VICRYL 4-0 PS2 18IN ABS (SUTURE) ×3 IMPLANT
SYR CONTROL 10ML LL (SYRINGE) IMPLANT
SYSTEM CHEST DRAIN TLS 7FR (DRAIN) IMPLANT
TOWEL OR 17X24 6PK STRL BLUE (TOWEL DISPOSABLE) ×3 IMPLANT
TOWEL OR 17X26 10 PK STRL BLUE (TOWEL DISPOSABLE) ×3 IMPLANT
TUBE CONNECTING 12'X1/4 (SUCTIONS) ×1
TUBE CONNECTING 12X1/4 (SUCTIONS) ×2 IMPLANT
WATER STERILE IRR 1000ML POUR (IV SOLUTION) IMPLANT
YANKAUER SUCT BULB TIP NO VENT (SUCTIONS) IMPLANT

## 2014-10-31 NOTE — Op Note (Signed)
PREOPERATIVE DIAGNOSIS: Left wrist intra-articular distal radius  fracture, 3 or more fragments.   POSTOPERATIVE DIAGNOSIS: Left wrist intra-articular distal radius  fracture, 3 or more fragments.   ATTENDING PHYSICIAN: Linna Hoff IV, MD who scrubbed and present  entire procedure.   ASSISTANT SURGEON: None.   ANESTHESIA:General via LMA  SURGICAL IMPLANTS: BIOMET CROSS LOCK DVR STANDARD  SURGICAL PROCEDURE:  1. Open treatment of left wrist intra-articular distal radius  fracture, 3 or more fragments.  2. Left wrist brachioradialis tenotomy and release.  3. Radiographs, stress radiographs, left wrist.   SURGICAL INDICATIONS: Ms. Guyse is a right-hand-dominant female  sustained an intra-articular distal radius fracture after a fall. The  patient was seen and evaluated in the office based on degree of  displacement and the volar displacement, recommended that she undergo  the above procedure. Risks, benefits, and alternatives were discussed  in detail with the patient. Signed informed consent was obtained.  Risks include, but not limited to bleeding, infection, damage to nearby  nerves, arteries, or tendons, nonunion, malunion, hardware failure, loss  of motion of the elbow, wrist, and digits, and need for further surgical  intervention.   PROCEDURE: The patient was properly identified in the preop holding  area. A mark with a permanent marker was made on the left wrist to  indicate correct operative site. The patient was then brought back to the  operating room. The patient received preoperative antibiotics. General  anesthesia was induced. Left upper extremity was prepped and draped in  normal sterile fashion. Time-out was called. Correct site was  identified, and procedure then begun. Attention was then turned to the  left wrist. The limb was then elevated using Esmarch exsanguination and  tourniquet insufflated. A longitudinal incision was made directly over  the FCR  sheath. Dissection was then carried down through the skin and  subcutaneous tissue. The FCR sheath was then opened proximally and  distally. Careful dissection was done going through the floor of the  FCR sheath where the FPL was identified. An L-shaped pronator quadratus  flap was then elevated. The fracture site was then opened and the  patient did have several fracture fragments and fracture lines that were,  extending into the joint through intra-articular fragment.   The Brachioradialis was carefully released and elevated off the radial Styloid. Protection of the first dorsal compartment tendons was done.  Following this the DVR plate was applied with distal locking pegs and  Bicortical screws proximally.  Final stress radiography was then carried out. Stress radiographs were then  obtained under live fluoro showing no widening of the SL interval. I  did not see any carpal dissociation with good fixation, without any  evidence of penetration in the articular margin with the locking pegs.   Postop, the pronator quadratus was then closed with 2-0 Vicryl.  Tourniquet was then deflated. Hemostasis was then obtained. The  subcutaneous tissues closed with 4-0 Vicryl and skin closed with simple  nylon sutures. Adaptic dressing and sterile compressive bandage was  then applied. The patient was then placed in a well-padded sugar-tong  splint. Extubated and taken to recovery room in good condition  POST OPERATIVE RADIOGRAPHS: 3 views of the wrist do show  the volar plate fixation in place. There is good position in both  planes.   POSTOPERATIVE PLAN: The patient will be admitted for IV antibiotics and  pain control; discharged in the morning. Seen back in the office for  approximately 10-14 days for wound check,  suture removal, and then x-  rays, short-arm cast for total 4 weeks, and then begin a therapy regimen  around a 4-week mark. Radiographs at each visit.   Melrose Nakayama,  MD
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
## 2014-10-31 NOTE — Anesthesia Preprocedure Evaluation (Addendum)
Anesthesia Evaluation  Patient identified by MRN, date of birth, ID band Patient awake    Reviewed: Allergy & Precautions, NPO status , Patient's Chart, lab work & pertinent test results  Airway Mallampati: II  TM Distance: >3 FB Neck ROM: Full    Dental no notable dental hx.    Pulmonary COPDCurrent Smoker,  breath sounds clear to auscultation  Pulmonary exam normal       Cardiovascular hypertension, + CAD, + Past MI and +CHF Normal cardiovascular exam+ dysrhythmias Atrial Fibrillation + pacemaker Rhythm:Regular Rate:Normal     Neuro/Psych negative neurological ROS  negative psych ROS   GI/Hepatic negative GI ROS, Neg liver ROS,   Endo/Other  diabetes  Renal/GU Renal InsufficiencyRenal disease  negative genitourinary   Musculoskeletal negative musculoskeletal ROS (+)   Abdominal   Peds negative pediatric ROS (+)  Hematology negative hematology ROS (+)   Anesthesia Other Findings   Reproductive/Obstetrics negative OB ROS                            Anesthesia Physical Anesthesia Plan  ASA: III  Anesthesia Plan: General   Post-op Pain Management:    Induction: Intravenous  Airway Management Planned: LMA  Additional Equipment:   Intra-op Plan:   Post-operative Plan: Extubation in OR  Informed Consent: I have reviewed the patients History and Physical, chart, labs and discussed the procedure including the risks, benefits and alternatives for the proposed anesthesia with the patient or authorized representative who has indicated his/her understanding and acceptance.   Dental advisory given  Plan Discussed with: CRNA and Surgeon  Anesthesia Plan Comments: (GA and LMA as backup)       Anesthesia Quick Evaluation

## 2014-10-31 NOTE — Progress Notes (Signed)
Dr. Caralyn Guile made aware that patient has remained on Eliquis.  Patient's daughter states that she was told that patient needed to continue taking Eliquis prior to surgery.

## 2014-10-31 NOTE — Transfer of Care (Signed)
Immediate Anesthesia Transfer of Care Note  Patient: Wanda Brooks  Procedure(s) Performed: Procedure(s) with comments: OPEN REDUCTION INTERNAL FIXATION (ORIF) LEFT DISTAL RADIAL FRACTURE (Left) - ANESTHESIA: AXILLARY BLOCK/IV SEDATION  Patient Location: PACU  Anesthesia Type:General  Level of Consciousness: awake and patient cooperative  Airway & Oxygen Therapy: Patient Spontanous Breathing and Patient connected to nasal cannula oxygen  Post-op Assessment: Report given to RN, Post -op Vital signs reviewed and stable and Patient moving all extremities  Post vital signs: Reviewed and stable  Last Vitals:  Filed Vitals:   10/31/14 1339  BP: 187/48  Pulse: 62  Temp: 36.8 C  Resp: 16    Complications: No apparent anesthesia complications

## 2014-10-31 NOTE — Anesthesia Postprocedure Evaluation (Signed)
  Anesthesia Post-op Note  Patient: Wanda Brooks  Procedure(s) Performed: Procedure(s) with comments: OPEN REDUCTION INTERNAL FIXATION (ORIF) LEFT DISTAL RADIAL FRACTURE (Left) - ANESTHESIA: AXILLARY BLOCK/IV SEDATION  Patient Location: PACU  Anesthesia Type:General  Level of Consciousness: awake, alert , oriented and patient cooperative  Airway and Oxygen Therapy: Patient Spontanous Breathing and Patient connected to nasal cannula oxygen  Post-op Pain: none  Post-op Assessment: Post-op Vital signs reviewed, Patient's Cardiovascular Status Stable, Respiratory Function Stable, Patent Airway, No signs of Nausea or vomiting and Pain level controlled              Post-op Vital Signs: Reviewed and stable  Last Vitals:  Filed Vitals:   10/31/14 1830  BP: 162/61  Pulse: 59  Temp: 36.8 C  Resp: 15    Complications: No apparent anesthesia complications

## 2014-10-31 NOTE — Anesthesia Procedure Notes (Signed)
Procedure Name: LMA Insertion Date/Time: 10/31/2014 4:01 PM Performed by: Melina Copa, Avanell Banwart R Pre-anesthesia Checklist: Patient identified, Emergency Drugs available, Suction available, Patient being monitored and Timeout performed Patient Re-evaluated:Patient Re-evaluated prior to inductionOxygen Delivery Method: Circle system utilized Preoxygenation: Pre-oxygenation with 100% oxygen Intubation Type: IV induction Ventilation: Mask ventilation without difficulty LMA: LMA inserted LMA Size: 4.0 Number of attempts: 1 Placement Confirmation: positive ETCO2 Tube secured with: Tape Dental Injury: Teeth and Oropharynx as per pre-operative assessment

## 2014-11-01 ENCOUNTER — Encounter (HOSPITAL_COMMUNITY): Payer: Self-pay | Admitting: Orthopedic Surgery

## 2014-11-01 DIAGNOSIS — S52572A Other intraarticular fracture of lower end of left radius, initial encounter for closed fracture: Secondary | ICD-10-CM | POA: Diagnosis not present

## 2014-11-01 DIAGNOSIS — F17210 Nicotine dependence, cigarettes, uncomplicated: Secondary | ICD-10-CM | POA: Diagnosis not present

## 2014-11-01 DIAGNOSIS — Z950 Presence of cardiac pacemaker: Secondary | ICD-10-CM | POA: Diagnosis not present

## 2014-11-01 DIAGNOSIS — I2510 Atherosclerotic heart disease of native coronary artery without angina pectoris: Secondary | ICD-10-CM | POA: Diagnosis not present

## 2014-11-01 DIAGNOSIS — I252 Old myocardial infarction: Secondary | ICD-10-CM | POA: Diagnosis not present

## 2014-11-01 DIAGNOSIS — I4891 Unspecified atrial fibrillation: Secondary | ICD-10-CM | POA: Diagnosis not present

## 2014-11-01 NOTE — Progress Notes (Signed)
Called Dr. Apolonio Schneiders to clarify if pt would discharge today. I also addressed the patient BP (185/55 @ 1pm, 184/55@ 3pm) with Dr.Ortman on the phone. He gave the order to discharge patient. Order carried out this time.

## 2014-11-01 NOTE — Discharge Instructions (Signed)
KEEP BANDAGE CLEAN AND DRY CALL OFFICE FOR F/U APPT [phone] IN 13 DAYS DR Caralyn Guile CELL [phone] KEEP HAND ELEVATED ABOVE HEART OK TO APPLY ICE TO OPERATIVE AREA CONTACT OFFICE IF ANY WORSENING PAIN OR CONCERNS.  Information on my medicine - ELIQUIS (apixaban)  This medication education was reviewed with me or my healthcare representative as part of my discharge preparation.  The pharmacist that spoke with me during my hospital stay was:  Tad Moore, [facility]  Why was Eliquis prescribed for you? Eliquis was prescribed for you to reduce the risk of forming blood clots that can cause a stroke if you have a medical condition called atrial fibrillation (a type of irregular heartbeat) OR to reduce the risk of a blood clots forming after orthopedic surgery.  What do You need to know about Eliquis ? Take your Eliquis TWICE DAILY - one tablet in the morning and one tablet in the evening with or without food.  It would be best to take the doses about the same time each day.  If you have difficulty swallowing the tablet whole please discuss with your pharmacist how to take the medication safely.  Take Eliquis exactly as prescribed by your doctor and DO NOT stop taking Eliquis without talking to the doctor who prescribed the medication.  Stopping may increase your risk of developing a new clot or stroke.  Refill your prescription before you run out.  After discharge, you should have regular check-up appointments with your healthcare provider that is prescribing your Eliquis.  In the future your dose may need to be changed if your kidney function or weight changes by a significant amount or as you get older.  What do you do if you miss a dose? If you miss a dose, take it as soon as you remember on the same day and resume taking twice daily.  Do not take more than one dose of ELIQUIS at the same time.  Important Safety Information A possible side effect of Eliquis is bleeding. You should  call your healthcare provider right away if you experience any of the following: ? Bleeding from an injury or your nose that does not stop. ? Unusual colored urine (red or dark brown) or unusual colored stools (red or black). ? Unusual bruising for unknown reasons. ? A serious fall or if you hit your head (even if there is no bleeding).  Some medicines may interact with Eliquis and might increase your risk of bleeding or clotting while on Eliquis. To help avoid this, consult your healthcare provider or pharmacist prior to using any new prescription or non-prescription medications, including herbals, vitamins, non-steroidal anti-inflammatory drugs (NSAIDs) and supplements.  This website has more information on Eliquis (apixaban): [email or web address].

## 2014-11-01 NOTE — Progress Notes (Signed)
Gave patient discharge instructions and all questions answered. IV pulled out and patient tolerated well. Patient is ready to discharge.

## 2014-11-01 NOTE — Discharge Summary (Signed)
Physician Discharge Summary  Patient ID: Wanda Brooks MRN: [id number] DOB/AGE: 1935-08-21 79 y.o.  Admit date: 10/31/2014 Discharge date: 11/01/2014  Admission Diagnoses: LEFT DISTAL RADIUS FRACTURE Past Medical History  Diagnosis Date  . Myocardial infarction 2006  . Hypertension   . COPD (chronic obstructive pulmonary disease)   . Osteoarthritis   . Diabetes mellitus without complication     Borderline  . Head injury, closed, with brief LOC 2005  . CHF (congestive heart failure)   . Shortness of breath dyspnea   . Anxiety   . GERD (gastroesophageal reflux disease)   . Constipation   . HOH (hard of hearing)   . Hyperlipemia   . Chest pressure     "for years" per daughter  . Dysrhythmia     afib noted on 07/26/14 PPM interrogation; converted to SR after 2 weeks Multaq which was d/c'd due to GI side effects  . Presence of permanent cardiac pacemaker     dual chamber Medtronic PPM 07/29/11 [facility], [place], [place])  . Chronic kidney disease     CRF    Discharge Diagnoses:  Active Problems:   Distal radius fracture, left   Surgeries: Procedure(s): OPEN REDUCTION INTERNAL FIXATION (ORIF) LEFT DISTAL RADIAL FRACTURE on 10/31/2014    Consultants:  none  Discharged Condition: Improved  Hospital Course: Wanda Brooks is an 79 y.o. female who was admitted 10/31/2014 with a chief complaint of No chief complaint on file. , and found to have a diagnosis of LEFT DISTAL RADIUS FRACTURE.  They were brought to the operating room on 10/31/2014 and underwent Procedure(s): OPEN REDUCTION INTERNAL FIXATION (ORIF) LEFT DISTAL RADIAL FRACTURE.    They were given perioperative antibiotics: Anti-infectives    Start     Dose/Rate Route Frequency Ordered Stop   11/01/14 0400  ceFAZolin (ANCEF) IVPB 1 g/50 mL premix  Status:  Discontinued     1 g 100 mL/hr over 30 Minutes Intravenous Every 8 hours 10/31/14 1855 11/01/14 1458   10/31/14 1900  ceFAZolin (ANCEF) IVPB 1 g/50 mL premix     1 g 100  mL/hr over 30 Minutes Intravenous NOW 10/31/14 1855 10/31/14 2052   10/31/14 1530  ceFAZolin (ANCEF) IVPB 2 g/50 mL premix     2 g 100 mL/hr over 30 Minutes Intravenous To ShortStay Surgical 10/30/14 1421 10/31/14 1551    .  They were given sequential compression devices, early ambulation, and Other (comment) for DVT prophylaxis.  Recent vital signs: Patient Vitals for the past 24 hrs:  BP Temp Temp src Pulse Resp SpO2  11/01/14 1544 (!) 184/55 mmHg - - 60 - -  11/01/14 1300 (!) 185/55 mmHg 98.4 F (36.9 C) Oral 66 (!) 22 96 %  11/01/14 1116 - - - - - 95 %  11/01/14 1000 (!) 203/61 mmHg 99 F (37.2 C) Oral 61 (!) 24 96 %  11/01/14 0420 (!) 196/72 mmHg 98.2 F (36.8 C) Oral 61 17 96 %  10/31/14 2036 (!) 169/57 mmHg 98 F (36.7 C) Oral 63 18 96 %  10/31/14 1858 (!) 187/65 mmHg 97.7 F (36.5 C) - 65 16 99 %  .  Recent laboratory studies: No results found.  Discharge Medications:     Medication List    TAKE these medications        amLODipine 10 MG tablet  Commonly known as:  NORVASC  Take 10 mg by mouth daily.     atorvastatin 20 MG tablet  Commonly known as:  LIPITOR  Take  20 mg by mouth every evening.     docusate sodium 100 MG capsule  Commonly known as:  COLACE  Take 100 mg by mouth 2 (two) times daily.     docusate sodium 100 MG capsule  Commonly known as:  COLACE  Take 1 capsule (100 mg total) by mouth 2 (two) times daily.     ELIQUIS 5 MG Tabs tablet  Generic drug:  apixaban  Take 5 mg by mouth 2 (two) times daily.     fosinopril 40 MG tablet  Commonly known as:  MONOPRIL  Take 40 mg by mouth daily.     HYDROcodone-acetaminophen 5-325 MG per tablet  Commonly known as:  NORCO/VICODIN  Take 1 tablet by mouth 3 (three) times daily.     isosorbide mononitrate 120 MG 24 hr tablet  Commonly known as:  IMDUR  Take 120 mg by mouth daily.     LORazepam 0.5 MG tablet  Commonly known as:  ATIVAN  Take 0.5 mg by mouth 3 (three) times daily as needed.      METAMUCIL 0.52 G capsule  Generic drug:  psyllium  Take 0.52 g by mouth 2 (two) times daily.     methocarbamol 500 MG tablet  Commonly known as:  ROBAXIN  Take 1 tablet (500 mg total) by mouth 4 (four) times daily.     metoprolol succinate 100 MG 24 hr tablet  Commonly known as:  TOPROL-XL  Take 50-100 mg by mouth 2 (two) times daily. Take 1 tablet (100mg ) in the morning, and 1/2 tablet (50mg ) in the afternoon     oxyCODONE-acetaminophen 10-325 MG per tablet  Commonly known as:  PERCOCET  Take 1 tablet by mouth every 4 (four) hours as needed for pain.     vitamin C 500 MG tablet  Commonly known as:  ASCORBIC ACID  Take 1 tablet (500 mg total) by mouth daily.        Diagnostic Studies: No results found.  They benefited maximally from their hospital stay and there were no complications.     Disposition: Final discharge disposition not confirmed      Follow-up Information    Follow up with Linna Hoff, MD.   Specialty:  Orthopedic Surgery   Contact information:   [address] [place] [address] [id number]        Signed: Linna Hoff 11/01/2014, 6:34 PM

## 2014-11-09 DIAGNOSIS — S52532D Colles' fracture of left radius, subsequent encounter for closed fracture with routine healing: Secondary | ICD-10-CM | POA: Diagnosis not present

## 2014-11-09 DIAGNOSIS — Z4789 Encounter for other orthopedic aftercare: Secondary | ICD-10-CM | POA: Diagnosis not present

## 2014-11-23 DIAGNOSIS — S52532D Colles' fracture of left radius, subsequent encounter for closed fracture with routine healing: Secondary | ICD-10-CM | POA: Diagnosis not present

## 2014-11-23 DIAGNOSIS — Z4789 Encounter for other orthopedic aftercare: Secondary | ICD-10-CM | POA: Diagnosis not present

## 2014-11-26 DIAGNOSIS — S52532D Colles' fracture of left radius, subsequent encounter for closed fracture with routine healing: Secondary | ICD-10-CM | POA: Diagnosis not present

## 2014-12-03 DIAGNOSIS — S52532D Colles' fracture of left radius, subsequent encounter for closed fracture with routine healing: Secondary | ICD-10-CM | POA: Diagnosis not present

## 2014-12-07 DIAGNOSIS — S52532D Colles' fracture of left radius, subsequent encounter for closed fracture with routine healing: Secondary | ICD-10-CM | POA: Diagnosis not present

## 2014-12-10 DIAGNOSIS — S52532D Colles' fracture of left radius, subsequent encounter for closed fracture with routine healing: Secondary | ICD-10-CM | POA: Diagnosis not present

## 2014-12-14 DIAGNOSIS — S52532D Colles' fracture of left radius, subsequent encounter for closed fracture with routine healing: Secondary | ICD-10-CM | POA: Diagnosis not present

## 2014-12-14 DIAGNOSIS — Z4789 Encounter for other orthopedic aftercare: Secondary | ICD-10-CM | POA: Diagnosis not present

## 2014-12-27 DIAGNOSIS — Z45010 Encounter for checking and testing of cardiac pacemaker pulse generator [battery]: Secondary | ICD-10-CM | POA: Diagnosis not present

## 2014-12-27 DIAGNOSIS — Z950 Presence of cardiac pacemaker: Secondary | ICD-10-CM | POA: Diagnosis not present

## 2014-12-28 DIAGNOSIS — N183 Chronic kidney disease, stage 3 (moderate): Secondary | ICD-10-CM | POA: Diagnosis not present

## 2014-12-28 DIAGNOSIS — E785 Hyperlipidemia, unspecified: Secondary | ICD-10-CM | POA: Diagnosis not present

## 2014-12-28 DIAGNOSIS — I10 Essential (primary) hypertension: Secondary | ICD-10-CM | POA: Diagnosis not present

## 2014-12-28 DIAGNOSIS — Z23 Encounter for immunization: Secondary | ICD-10-CM | POA: Diagnosis not present

## 2014-12-28 DIAGNOSIS — E119 Type 2 diabetes mellitus without complications: Secondary | ICD-10-CM | POA: Diagnosis not present

## 2014-12-28 DIAGNOSIS — I129 Hypertensive chronic kidney disease with stage 1 through stage 4 chronic kidney disease, or unspecified chronic kidney disease: Secondary | ICD-10-CM | POA: Diagnosis not present

## 2014-12-28 DIAGNOSIS — Z79891 Long term (current) use of opiate analgesic: Secondary | ICD-10-CM | POA: Diagnosis not present

## 2014-12-28 DIAGNOSIS — R809 Proteinuria, unspecified: Secondary | ICD-10-CM | POA: Diagnosis not present

## 2014-12-28 DIAGNOSIS — E559 Vitamin D deficiency, unspecified: Secondary | ICD-10-CM | POA: Diagnosis not present

## 2014-12-28 DIAGNOSIS — J449 Chronic obstructive pulmonary disease, unspecified: Secondary | ICD-10-CM | POA: Diagnosis not present

## 2014-12-28 DIAGNOSIS — I2510 Atherosclerotic heart disease of native coronary artery without angina pectoris: Secondary | ICD-10-CM | POA: Diagnosis not present

## 2014-12-28 DIAGNOSIS — K219 Gastro-esophageal reflux disease without esophagitis: Secondary | ICD-10-CM | POA: Diagnosis not present

## 2015-01-22 DIAGNOSIS — R002 Palpitations: Secondary | ICD-10-CM | POA: Diagnosis not present

## 2015-01-22 DIAGNOSIS — I2510 Atherosclerotic heart disease of native coronary artery without angina pectoris: Secondary | ICD-10-CM | POA: Diagnosis not present

## 2015-01-22 DIAGNOSIS — I480 Paroxysmal atrial fibrillation: Secondary | ICD-10-CM | POA: Diagnosis not present

## 2015-01-22 DIAGNOSIS — Z951 Presence of aortocoronary bypass graft: Secondary | ICD-10-CM | POA: Diagnosis not present

## 2015-01-22 DIAGNOSIS — E785 Hyperlipidemia, unspecified: Secondary | ICD-10-CM | POA: Diagnosis not present

## 2015-01-22 DIAGNOSIS — I779 Disorder of arteries and arterioles, unspecified: Secondary | ICD-10-CM | POA: Diagnosis not present

## 2015-01-22 DIAGNOSIS — I10 Essential (primary) hypertension: Secondary | ICD-10-CM | POA: Diagnosis not present

## 2015-03-14 DIAGNOSIS — H1131 Conjunctival hemorrhage, right eye: Secondary | ICD-10-CM | POA: Diagnosis not present

## 2015-03-28 DIAGNOSIS — Z950 Presence of cardiac pacemaker: Secondary | ICD-10-CM | POA: Diagnosis not present

## 2015-03-28 DIAGNOSIS — I480 Paroxysmal atrial fibrillation: Secondary | ICD-10-CM | POA: Diagnosis not present

## 2015-04-05 DIAGNOSIS — E785 Hyperlipidemia, unspecified: Secondary | ICD-10-CM | POA: Diagnosis not present

## 2015-04-05 DIAGNOSIS — Z79891 Long term (current) use of opiate analgesic: Secondary | ICD-10-CM | POA: Diagnosis not present

## 2015-04-05 DIAGNOSIS — I129 Hypertensive chronic kidney disease with stage 1 through stage 4 chronic kidney disease, or unspecified chronic kidney disease: Secondary | ICD-10-CM | POA: Diagnosis not present

## 2015-04-05 DIAGNOSIS — I2510 Atherosclerotic heart disease of native coronary artery without angina pectoris: Secondary | ICD-10-CM | POA: Diagnosis not present

## 2015-04-05 DIAGNOSIS — F419 Anxiety disorder, unspecified: Secondary | ICD-10-CM | POA: Diagnosis not present

## 2015-04-05 DIAGNOSIS — I779 Disorder of arteries and arterioles, unspecified: Secondary | ICD-10-CM | POA: Diagnosis not present

## 2015-04-05 DIAGNOSIS — I10 Essential (primary) hypertension: Secondary | ICD-10-CM | POA: Diagnosis not present

## 2015-04-05 DIAGNOSIS — M47816 Spondylosis without myelopathy or radiculopathy, lumbar region: Secondary | ICD-10-CM | POA: Diagnosis not present

## 2015-04-05 DIAGNOSIS — E119 Type 2 diabetes mellitus without complications: Secondary | ICD-10-CM | POA: Diagnosis not present

## 2015-06-10 DIAGNOSIS — I10 Essential (primary) hypertension: Secondary | ICD-10-CM | POA: Diagnosis not present

## 2015-06-10 DIAGNOSIS — R801 Persistent proteinuria, unspecified: Secondary | ICD-10-CM | POA: Diagnosis not present

## 2015-06-10 DIAGNOSIS — N183 Chronic kidney disease, stage 3 (moderate): Secondary | ICD-10-CM | POA: Diagnosis not present

## 2015-06-27 DIAGNOSIS — Z950 Presence of cardiac pacemaker: Secondary | ICD-10-CM | POA: Diagnosis not present

## 2015-06-27 DIAGNOSIS — I480 Paroxysmal atrial fibrillation: Secondary | ICD-10-CM | POA: Diagnosis not present

## 2015-07-19 DIAGNOSIS — I10 Essential (primary) hypertension: Secondary | ICD-10-CM | POA: Diagnosis not present

## 2015-07-19 DIAGNOSIS — I129 Hypertensive chronic kidney disease with stage 1 through stage 4 chronic kidney disease, or unspecified chronic kidney disease: Secondary | ICD-10-CM | POA: Diagnosis not present

## 2015-07-19 DIAGNOSIS — E785 Hyperlipidemia, unspecified: Secondary | ICD-10-CM | POA: Diagnosis not present

## 2015-07-19 DIAGNOSIS — I739 Peripheral vascular disease, unspecified: Secondary | ICD-10-CM | POA: Diagnosis not present

## 2015-07-19 DIAGNOSIS — N183 Chronic kidney disease, stage 3 (moderate): Secondary | ICD-10-CM | POA: Diagnosis not present

## 2015-07-19 DIAGNOSIS — M47816 Spondylosis without myelopathy or radiculopathy, lumbar region: Secondary | ICD-10-CM | POA: Diagnosis not present

## 2015-07-19 DIAGNOSIS — Z79891 Long term (current) use of opiate analgesic: Secondary | ICD-10-CM | POA: Diagnosis not present

## 2015-07-19 DIAGNOSIS — J449 Chronic obstructive pulmonary disease, unspecified: Secondary | ICD-10-CM | POA: Diagnosis not present

## 2015-07-19 DIAGNOSIS — I6529 Occlusion and stenosis of unspecified carotid artery: Secondary | ICD-10-CM | POA: Diagnosis not present

## 2015-07-19 DIAGNOSIS — I2510 Atherosclerotic heart disease of native coronary artery without angina pectoris: Secondary | ICD-10-CM | POA: Diagnosis not present

## 2015-07-19 DIAGNOSIS — K219 Gastro-esophageal reflux disease without esophagitis: Secondary | ICD-10-CM | POA: Diagnosis not present

## 2015-07-19 DIAGNOSIS — E119 Type 2 diabetes mellitus without complications: Secondary | ICD-10-CM | POA: Diagnosis not present

## 2015-07-19 DIAGNOSIS — I779 Disorder of arteries and arterioles, unspecified: Secondary | ICD-10-CM | POA: Diagnosis not present

## 2015-07-30 DIAGNOSIS — I779 Disorder of arteries and arterioles, unspecified: Secondary | ICD-10-CM | POA: Diagnosis not present

## 2015-07-30 DIAGNOSIS — Z951 Presence of aortocoronary bypass graft: Secondary | ICD-10-CM | POA: Diagnosis not present

## 2015-07-30 DIAGNOSIS — I480 Paroxysmal atrial fibrillation: Secondary | ICD-10-CM | POA: Diagnosis not present

## 2015-07-30 DIAGNOSIS — Z950 Presence of cardiac pacemaker: Secondary | ICD-10-CM | POA: Diagnosis not present

## 2015-07-30 DIAGNOSIS — I2510 Atherosclerotic heart disease of native coronary artery without angina pectoris: Secondary | ICD-10-CM | POA: Diagnosis not present

## 2015-07-30 DIAGNOSIS — I10 Essential (primary) hypertension: Secondary | ICD-10-CM | POA: Diagnosis not present

## 2015-07-30 DIAGNOSIS — E785 Hyperlipidemia, unspecified: Secondary | ICD-10-CM | POA: Diagnosis not present

## 2015-09-14 DIAGNOSIS — J90 Pleural effusion, not elsewhere classified: Secondary | ICD-10-CM | POA: Diagnosis not present

## 2015-09-14 DIAGNOSIS — E876 Hypokalemia: Secondary | ICD-10-CM | POA: Diagnosis present

## 2015-09-14 DIAGNOSIS — T84020A Dislocation of internal right hip prosthesis, initial encounter: Secondary | ICD-10-CM | POA: Diagnosis not present

## 2015-09-14 DIAGNOSIS — Z951 Presence of aortocoronary bypass graft: Secondary | ICD-10-CM | POA: Diagnosis not present

## 2015-09-14 DIAGNOSIS — D72829 Elevated white blood cell count, unspecified: Secondary | ICD-10-CM | POA: Diagnosis present

## 2015-09-14 DIAGNOSIS — G629 Polyneuropathy, unspecified: Secondary | ICD-10-CM | POA: Diagnosis not present

## 2015-09-14 DIAGNOSIS — R740 Nonspecific elevation of levels of transaminase and lactic acid dehydrogenase [LDH]: Secondary | ICD-10-CM | POA: Diagnosis present

## 2015-09-14 DIAGNOSIS — J209 Acute bronchitis, unspecified: Secondary | ICD-10-CM | POA: Diagnosis present

## 2015-09-14 DIAGNOSIS — J440 Chronic obstructive pulmonary disease with acute lower respiratory infection: Secondary | ICD-10-CM | POA: Diagnosis present

## 2015-09-14 DIAGNOSIS — I5033 Acute on chronic diastolic (congestive) heart failure: Secondary | ICD-10-CM | POA: Diagnosis not present

## 2015-09-14 DIAGNOSIS — R270 Ataxia, unspecified: Secondary | ICD-10-CM | POA: Diagnosis not present

## 2015-09-14 DIAGNOSIS — N179 Acute kidney failure, unspecified: Secondary | ICD-10-CM | POA: Diagnosis not present

## 2015-09-14 DIAGNOSIS — I361 Nonrheumatic tricuspid (valve) insufficiency: Secondary | ICD-10-CM | POA: Diagnosis not present

## 2015-09-14 DIAGNOSIS — I130 Hypertensive heart and chronic kidney disease with heart failure and stage 1 through stage 4 chronic kidney disease, or unspecified chronic kidney disease: Secondary | ICD-10-CM | POA: Diagnosis present

## 2015-09-14 DIAGNOSIS — I452 Bifascicular block: Secondary | ICD-10-CM | POA: Diagnosis present

## 2015-09-14 DIAGNOSIS — J9601 Acute respiratory failure with hypoxia: Secondary | ICD-10-CM | POA: Diagnosis not present

## 2015-09-14 DIAGNOSIS — M25551 Pain in right hip: Secondary | ICD-10-CM | POA: Diagnosis not present

## 2015-09-14 DIAGNOSIS — I2510 Atherosclerotic heart disease of native coronary artery without angina pectoris: Secondary | ICD-10-CM | POA: Diagnosis not present

## 2015-09-14 DIAGNOSIS — N183 Chronic kidney disease, stage 3 (moderate): Secondary | ICD-10-CM | POA: Diagnosis present

## 2015-09-14 DIAGNOSIS — F419 Anxiety disorder, unspecified: Secondary | ICD-10-CM | POA: Diagnosis present

## 2015-09-14 DIAGNOSIS — J449 Chronic obstructive pulmonary disease, unspecified: Secondary | ICD-10-CM | POA: Diagnosis not present

## 2015-09-14 DIAGNOSIS — J189 Pneumonia, unspecified organism: Secondary | ICD-10-CM | POA: Diagnosis not present

## 2015-09-14 DIAGNOSIS — I495 Sick sinus syndrome: Secondary | ICD-10-CM | POA: Diagnosis not present

## 2015-09-14 DIAGNOSIS — T380X5A Adverse effect of glucocorticoids and synthetic analogues, initial encounter: Secondary | ICD-10-CM | POA: Diagnosis present

## 2015-09-14 DIAGNOSIS — J441 Chronic obstructive pulmonary disease with (acute) exacerbation: Secondary | ICD-10-CM | POA: Diagnosis not present

## 2015-09-14 DIAGNOSIS — Z471 Aftercare following joint replacement surgery: Secondary | ICD-10-CM | POA: Diagnosis not present

## 2015-09-14 DIAGNOSIS — S73004A Unspecified dislocation of right hip, initial encounter: Secondary | ICD-10-CM | POA: Diagnosis not present

## 2015-09-14 DIAGNOSIS — Z96641 Presence of right artificial hip joint: Secondary | ICD-10-CM | POA: Diagnosis not present

## 2015-09-14 DIAGNOSIS — E785 Hyperlipidemia, unspecified: Secondary | ICD-10-CM | POA: Diagnosis not present

## 2015-09-14 DIAGNOSIS — I517 Cardiomegaly: Secondary | ICD-10-CM | POA: Diagnosis not present

## 2015-09-14 DIAGNOSIS — I480 Paroxysmal atrial fibrillation: Secondary | ICD-10-CM | POA: Diagnosis not present

## 2015-09-14 DIAGNOSIS — Z955 Presence of coronary angioplasty implant and graft: Secondary | ICD-10-CM | POA: Diagnosis not present

## 2015-09-14 DIAGNOSIS — I340 Nonrheumatic mitral (valve) insufficiency: Secondary | ICD-10-CM | POA: Diagnosis not present

## 2015-09-14 DIAGNOSIS — I5189 Other ill-defined heart diseases: Secondary | ICD-10-CM | POA: Diagnosis not present

## 2015-09-14 DIAGNOSIS — Z950 Presence of cardiac pacemaker: Secondary | ICD-10-CM | POA: Diagnosis not present

## 2015-09-14 DIAGNOSIS — I509 Heart failure, unspecified: Secondary | ICD-10-CM | POA: Diagnosis not present

## 2015-09-14 DIAGNOSIS — R0602 Shortness of breath: Secondary | ICD-10-CM | POA: Diagnosis not present

## 2015-09-14 DIAGNOSIS — J9600 Acute respiratory failure, unspecified whether with hypoxia or hypercapnia: Secondary | ICD-10-CM | POA: Diagnosis not present

## 2015-09-14 DIAGNOSIS — R7303 Prediabetes: Secondary | ICD-10-CM | POA: Diagnosis present

## 2015-09-14 DIAGNOSIS — I10 Essential (primary) hypertension: Secondary | ICD-10-CM | POA: Diagnosis not present

## 2015-09-14 DIAGNOSIS — I272 Other secondary pulmonary hypertension: Secondary | ICD-10-CM | POA: Diagnosis present

## 2015-09-14 DIAGNOSIS — J40 Bronchitis, not specified as acute or chronic: Secondary | ICD-10-CM | POA: Diagnosis not present

## 2015-09-14 DIAGNOSIS — I5030 Unspecified diastolic (congestive) heart failure: Secondary | ICD-10-CM | POA: Diagnosis not present

## 2015-09-22 DIAGNOSIS — I130 Hypertensive heart and chronic kidney disease with heart failure and stage 1 through stage 4 chronic kidney disease, or unspecified chronic kidney disease: Secondary | ICD-10-CM | POA: Diagnosis not present

## 2015-09-22 DIAGNOSIS — I5031 Acute diastolic (congestive) heart failure: Secondary | ICD-10-CM | POA: Diagnosis not present

## 2015-09-22 DIAGNOSIS — N183 Chronic kidney disease, stage 3 (moderate): Secondary | ICD-10-CM | POA: Diagnosis not present

## 2015-09-22 DIAGNOSIS — T84020D Dislocation of internal right hip prosthesis, subsequent encounter: Secondary | ICD-10-CM | POA: Diagnosis not present

## 2015-09-22 DIAGNOSIS — J441 Chronic obstructive pulmonary disease with (acute) exacerbation: Secondary | ICD-10-CM | POA: Diagnosis not present

## 2015-09-22 DIAGNOSIS — I2510 Atherosclerotic heart disease of native coronary artery without angina pectoris: Secondary | ICD-10-CM | POA: Diagnosis not present

## 2015-09-26 DIAGNOSIS — J441 Chronic obstructive pulmonary disease with (acute) exacerbation: Secondary | ICD-10-CM | POA: Diagnosis not present

## 2015-09-26 DIAGNOSIS — I130 Hypertensive heart and chronic kidney disease with heart failure and stage 1 through stage 4 chronic kidney disease, or unspecified chronic kidney disease: Secondary | ICD-10-CM | POA: Diagnosis not present

## 2015-09-26 DIAGNOSIS — I2510 Atherosclerotic heart disease of native coronary artery without angina pectoris: Secondary | ICD-10-CM | POA: Diagnosis not present

## 2015-09-26 DIAGNOSIS — I5031 Acute diastolic (congestive) heart failure: Secondary | ICD-10-CM | POA: Diagnosis not present

## 2015-09-26 DIAGNOSIS — T84020D Dislocation of internal right hip prosthesis, subsequent encounter: Secondary | ICD-10-CM | POA: Diagnosis not present

## 2015-09-26 DIAGNOSIS — N183 Chronic kidney disease, stage 3 (moderate): Secondary | ICD-10-CM | POA: Diagnosis not present

## 2015-10-01 DIAGNOSIS — I129 Hypertensive chronic kidney disease with stage 1 through stage 4 chronic kidney disease, or unspecified chronic kidney disease: Secondary | ICD-10-CM | POA: Diagnosis not present

## 2015-10-01 DIAGNOSIS — J449 Chronic obstructive pulmonary disease, unspecified: Secondary | ICD-10-CM | POA: Diagnosis not present

## 2015-10-02 DIAGNOSIS — Z96641 Presence of right artificial hip joint: Secondary | ICD-10-CM | POA: Diagnosis not present

## 2015-10-02 DIAGNOSIS — M25551 Pain in right hip: Secondary | ICD-10-CM | POA: Diagnosis not present

## 2015-10-02 DIAGNOSIS — S73004A Unspecified dislocation of right hip, initial encounter: Secondary | ICD-10-CM | POA: Diagnosis not present

## 2015-10-03 DIAGNOSIS — Z7901 Long term (current) use of anticoagulants: Secondary | ICD-10-CM | POA: Diagnosis not present

## 2015-10-03 DIAGNOSIS — I480 Paroxysmal atrial fibrillation: Secondary | ICD-10-CM | POA: Diagnosis not present

## 2015-10-03 DIAGNOSIS — I110 Hypertensive heart disease with heart failure: Secondary | ICD-10-CM | POA: Diagnosis not present

## 2015-10-03 DIAGNOSIS — I2510 Atherosclerotic heart disease of native coronary artery without angina pectoris: Secondary | ICD-10-CM | POA: Diagnosis not present

## 2015-10-03 DIAGNOSIS — I5030 Unspecified diastolic (congestive) heart failure: Secondary | ICD-10-CM | POA: Diagnosis not present

## 2015-10-03 DIAGNOSIS — Z950 Presence of cardiac pacemaker: Secondary | ICD-10-CM | POA: Diagnosis not present

## 2015-10-04 DIAGNOSIS — N183 Chronic kidney disease, stage 3 (moderate): Secondary | ICD-10-CM | POA: Diagnosis not present

## 2015-10-04 DIAGNOSIS — I130 Hypertensive heart and chronic kidney disease with heart failure and stage 1 through stage 4 chronic kidney disease, or unspecified chronic kidney disease: Secondary | ICD-10-CM | POA: Diagnosis not present

## 2015-10-04 DIAGNOSIS — I5031 Acute diastolic (congestive) heart failure: Secondary | ICD-10-CM | POA: Diagnosis not present

## 2015-10-04 DIAGNOSIS — T84020D Dislocation of internal right hip prosthesis, subsequent encounter: Secondary | ICD-10-CM | POA: Diagnosis not present

## 2015-10-04 DIAGNOSIS — I2510 Atherosclerotic heart disease of native coronary artery without angina pectoris: Secondary | ICD-10-CM | POA: Diagnosis not present

## 2015-10-04 DIAGNOSIS — J441 Chronic obstructive pulmonary disease with (acute) exacerbation: Secondary | ICD-10-CM | POA: Diagnosis not present

## 2015-10-09 DIAGNOSIS — I2510 Atherosclerotic heart disease of native coronary artery without angina pectoris: Secondary | ICD-10-CM | POA: Diagnosis not present

## 2015-10-09 DIAGNOSIS — I5031 Acute diastolic (congestive) heart failure: Secondary | ICD-10-CM | POA: Diagnosis not present

## 2015-10-09 DIAGNOSIS — N183 Chronic kidney disease, stage 3 (moderate): Secondary | ICD-10-CM | POA: Diagnosis not present

## 2015-10-09 DIAGNOSIS — T84020D Dislocation of internal right hip prosthesis, subsequent encounter: Secondary | ICD-10-CM | POA: Diagnosis not present

## 2015-10-09 DIAGNOSIS — I130 Hypertensive heart and chronic kidney disease with heart failure and stage 1 through stage 4 chronic kidney disease, or unspecified chronic kidney disease: Secondary | ICD-10-CM | POA: Diagnosis not present

## 2015-10-09 DIAGNOSIS — J441 Chronic obstructive pulmonary disease with (acute) exacerbation: Secondary | ICD-10-CM | POA: Diagnosis not present

## 2015-10-11 DIAGNOSIS — I5031 Acute diastolic (congestive) heart failure: Secondary | ICD-10-CM | POA: Diagnosis not present

## 2015-10-11 DIAGNOSIS — I130 Hypertensive heart and chronic kidney disease with heart failure and stage 1 through stage 4 chronic kidney disease, or unspecified chronic kidney disease: Secondary | ICD-10-CM | POA: Diagnosis not present

## 2015-10-11 DIAGNOSIS — N183 Chronic kidney disease, stage 3 (moderate): Secondary | ICD-10-CM | POA: Diagnosis not present

## 2015-10-11 DIAGNOSIS — I2510 Atherosclerotic heart disease of native coronary artery without angina pectoris: Secondary | ICD-10-CM | POA: Diagnosis not present

## 2015-10-11 DIAGNOSIS — T84020D Dislocation of internal right hip prosthesis, subsequent encounter: Secondary | ICD-10-CM | POA: Diagnosis not present

## 2015-10-11 DIAGNOSIS — J441 Chronic obstructive pulmonary disease with (acute) exacerbation: Secondary | ICD-10-CM | POA: Diagnosis not present

## 2015-10-15 DIAGNOSIS — N183 Chronic kidney disease, stage 3 (moderate): Secondary | ICD-10-CM | POA: Diagnosis not present

## 2015-10-15 DIAGNOSIS — T84020D Dislocation of internal right hip prosthesis, subsequent encounter: Secondary | ICD-10-CM | POA: Diagnosis not present

## 2015-10-15 DIAGNOSIS — I5031 Acute diastolic (congestive) heart failure: Secondary | ICD-10-CM | POA: Diagnosis not present

## 2015-10-15 DIAGNOSIS — J441 Chronic obstructive pulmonary disease with (acute) exacerbation: Secondary | ICD-10-CM | POA: Diagnosis not present

## 2015-10-15 DIAGNOSIS — I130 Hypertensive heart and chronic kidney disease with heart failure and stage 1 through stage 4 chronic kidney disease, or unspecified chronic kidney disease: Secondary | ICD-10-CM | POA: Diagnosis not present

## 2015-10-15 DIAGNOSIS — I2510 Atherosclerotic heart disease of native coronary artery without angina pectoris: Secondary | ICD-10-CM | POA: Diagnosis not present

## 2015-10-17 DIAGNOSIS — I130 Hypertensive heart and chronic kidney disease with heart failure and stage 1 through stage 4 chronic kidney disease, or unspecified chronic kidney disease: Secondary | ICD-10-CM | POA: Diagnosis not present

## 2015-10-17 DIAGNOSIS — J441 Chronic obstructive pulmonary disease with (acute) exacerbation: Secondary | ICD-10-CM | POA: Diagnosis not present

## 2015-10-17 DIAGNOSIS — I2510 Atherosclerotic heart disease of native coronary artery without angina pectoris: Secondary | ICD-10-CM | POA: Diagnosis not present

## 2015-10-17 DIAGNOSIS — T84020D Dislocation of internal right hip prosthesis, subsequent encounter: Secondary | ICD-10-CM | POA: Diagnosis not present

## 2015-10-17 DIAGNOSIS — I5031 Acute diastolic (congestive) heart failure: Secondary | ICD-10-CM | POA: Diagnosis not present

## 2015-10-17 DIAGNOSIS — N183 Chronic kidney disease, stage 3 (moderate): Secondary | ICD-10-CM | POA: Diagnosis not present

## 2015-10-18 DIAGNOSIS — I5030 Unspecified diastolic (congestive) heart failure: Secondary | ICD-10-CM | POA: Diagnosis not present

## 2015-10-18 DIAGNOSIS — J449 Chronic obstructive pulmonary disease, unspecified: Secondary | ICD-10-CM | POA: Diagnosis not present

## 2015-10-18 DIAGNOSIS — S73004D Unspecified dislocation of right hip, subsequent encounter: Secondary | ICD-10-CM | POA: Diagnosis not present

## 2015-10-18 DIAGNOSIS — I272 Other secondary pulmonary hypertension: Secondary | ICD-10-CM | POA: Diagnosis not present

## 2015-10-18 DIAGNOSIS — R5383 Other fatigue: Secondary | ICD-10-CM | POA: Diagnosis not present

## 2015-10-18 DIAGNOSIS — I340 Nonrheumatic mitral (valve) insufficiency: Secondary | ICD-10-CM | POA: Diagnosis not present

## 2015-10-18 DIAGNOSIS — I129 Hypertensive chronic kidney disease with stage 1 through stage 4 chronic kidney disease, or unspecified chronic kidney disease: Secondary | ICD-10-CM | POA: Diagnosis not present

## 2015-10-18 DIAGNOSIS — I480 Paroxysmal atrial fibrillation: Secondary | ICD-10-CM | POA: Diagnosis not present

## 2015-10-22 DIAGNOSIS — I130 Hypertensive heart and chronic kidney disease with heart failure and stage 1 through stage 4 chronic kidney disease, or unspecified chronic kidney disease: Secondary | ICD-10-CM | POA: Diagnosis not present

## 2015-10-22 DIAGNOSIS — I2510 Atherosclerotic heart disease of native coronary artery without angina pectoris: Secondary | ICD-10-CM | POA: Diagnosis not present

## 2015-10-22 DIAGNOSIS — J441 Chronic obstructive pulmonary disease with (acute) exacerbation: Secondary | ICD-10-CM | POA: Diagnosis not present

## 2015-10-22 DIAGNOSIS — T84020D Dislocation of internal right hip prosthesis, subsequent encounter: Secondary | ICD-10-CM | POA: Diagnosis not present

## 2015-10-22 DIAGNOSIS — I5031 Acute diastolic (congestive) heart failure: Secondary | ICD-10-CM | POA: Diagnosis not present

## 2015-10-22 DIAGNOSIS — N183 Chronic kidney disease, stage 3 (moderate): Secondary | ICD-10-CM | POA: Diagnosis not present

## 2015-10-24 DIAGNOSIS — T84020D Dislocation of internal right hip prosthesis, subsequent encounter: Secondary | ICD-10-CM | POA: Diagnosis not present

## 2015-10-24 DIAGNOSIS — I2510 Atherosclerotic heart disease of native coronary artery without angina pectoris: Secondary | ICD-10-CM | POA: Diagnosis not present

## 2015-10-24 DIAGNOSIS — I130 Hypertensive heart and chronic kidney disease with heart failure and stage 1 through stage 4 chronic kidney disease, or unspecified chronic kidney disease: Secondary | ICD-10-CM | POA: Diagnosis not present

## 2015-10-24 DIAGNOSIS — J441 Chronic obstructive pulmonary disease with (acute) exacerbation: Secondary | ICD-10-CM | POA: Diagnosis not present

## 2015-10-24 DIAGNOSIS — N183 Chronic kidney disease, stage 3 (moderate): Secondary | ICD-10-CM | POA: Diagnosis not present

## 2015-10-24 DIAGNOSIS — I5031 Acute diastolic (congestive) heart failure: Secondary | ICD-10-CM | POA: Diagnosis not present

## 2015-10-28 ENCOUNTER — Emergency Department (HOSPITAL_COMMUNITY): Payer: Medicare Other

## 2015-10-28 ENCOUNTER — Encounter (HOSPITAL_COMMUNITY): Payer: Self-pay | Admitting: Emergency Medicine

## 2015-10-28 ENCOUNTER — Emergency Department (HOSPITAL_COMMUNITY)
Admission: EM | Admit: 2015-10-28 | Discharge: 2015-10-28 | Disposition: A | Discharge status: Home / Self Care | Payer: Medicare Other | Attending: Emergency Medicine | Admitting: Emergency Medicine

## 2015-10-28 DIAGNOSIS — J449 Chronic obstructive pulmonary disease, unspecified: Secondary | ICD-10-CM | POA: Insufficient documentation

## 2015-10-28 DIAGNOSIS — I509 Heart failure, unspecified: Secondary | ICD-10-CM | POA: Insufficient documentation

## 2015-10-28 DIAGNOSIS — X501XXA Overexertion from prolonged static or awkward postures, initial encounter: Secondary | ICD-10-CM | POA: Insufficient documentation

## 2015-10-28 DIAGNOSIS — N189 Chronic kidney disease, unspecified: Secondary | ICD-10-CM | POA: Insufficient documentation

## 2015-10-28 DIAGNOSIS — I130 Hypertensive heart and chronic kidney disease with heart failure and stage 1 through stage 4 chronic kidney disease, or unspecified chronic kidney disease: Secondary | ICD-10-CM | POA: Diagnosis not present

## 2015-10-28 DIAGNOSIS — S79911A Unspecified injury of right hip, initial encounter: Secondary | ICD-10-CM | POA: Diagnosis present

## 2015-10-28 DIAGNOSIS — T84020A Dislocation of internal right hip prosthesis, initial encounter: Secondary | ICD-10-CM | POA: Insufficient documentation

## 2015-10-28 DIAGNOSIS — Y9389 Activity, other specified: Secondary | ICD-10-CM | POA: Insufficient documentation

## 2015-10-28 DIAGNOSIS — Z96649 Presence of unspecified artificial hip joint: Secondary | ICD-10-CM

## 2015-10-28 DIAGNOSIS — Z79899 Other long term (current) drug therapy: Secondary | ICD-10-CM | POA: Diagnosis not present

## 2015-10-28 DIAGNOSIS — Z950 Presence of cardiac pacemaker: Secondary | ICD-10-CM | POA: Insufficient documentation

## 2015-10-28 DIAGNOSIS — Y999 Unspecified external cause status: Secondary | ICD-10-CM | POA: Insufficient documentation

## 2015-10-28 DIAGNOSIS — E1122 Type 2 diabetes mellitus with diabetic chronic kidney disease: Secondary | ICD-10-CM | POA: Insufficient documentation

## 2015-10-28 DIAGNOSIS — Y929 Unspecified place or not applicable: Secondary | ICD-10-CM | POA: Diagnosis not present

## 2015-10-28 DIAGNOSIS — F17200 Nicotine dependence, unspecified, uncomplicated: Secondary | ICD-10-CM | POA: Diagnosis not present

## 2015-10-28 DIAGNOSIS — R52 Pain, unspecified: Secondary | ICD-10-CM

## 2015-10-28 DIAGNOSIS — T84029A Dislocation of unspecified internal joint prosthesis, initial encounter: Secondary | ICD-10-CM

## 2015-10-28 DIAGNOSIS — Z96641 Presence of right artificial hip joint: Secondary | ICD-10-CM | POA: Diagnosis not present

## 2015-10-28 DIAGNOSIS — Y828 Other medical devices associated with adverse incidents: Secondary | ICD-10-CM | POA: Insufficient documentation

## 2015-10-28 DIAGNOSIS — Z471 Aftercare following joint replacement surgery: Secondary | ICD-10-CM | POA: Diagnosis not present

## 2015-10-28 MED ORDER — FENTANYL CITRATE (PF) 100 MCG/2ML IJ SOLN
25.0000 ug | Freq: Once | INTRAMUSCULAR | Status: AC
  Administered 2015-10-28: 25 ug via INTRAVENOUS
  Filled 2015-10-28: qty 2

## 2015-10-28 MED ORDER — PROPOFOL 10 MG/ML IV BOLUS
0.5000 mg/kg | Freq: Once | INTRAVENOUS | Status: AC
  Administered 2015-10-28: 30.2 mg via INTRAVENOUS
  Filled 2015-10-28: qty 20
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
## 2015-10-28 NOTE — ED Notes (Signed)
Bed: WA19 Expected date:  Expected time:  Means of arrival:  Comments: 

## 2015-10-28 NOTE — ED Triage Notes (Signed)
Patient had multiple right hip replacements and when patient went to sit down today right hip pop out of place.

## 2015-10-28 NOTE — ED Notes (Signed)
Patient transported to X-ray 

## 2015-10-28 NOTE — Progress Notes (Signed)
Wanda Brooks is currently engaged with skilled nursing for this pt .  This patient is part of our Cardiopulmonary Program.

## 2015-10-28 NOTE — Sedation Documentation (Signed)
Patient transferred to Xray

## 2015-10-28 NOTE — ED Notes (Signed)
MD at bedside. 

## 2015-10-29 DIAGNOSIS — J441 Chronic obstructive pulmonary disease with (acute) exacerbation: Secondary | ICD-10-CM | POA: Diagnosis not present

## 2015-10-29 DIAGNOSIS — I130 Hypertensive heart and chronic kidney disease with heart failure and stage 1 through stage 4 chronic kidney disease, or unspecified chronic kidney disease: Secondary | ICD-10-CM | POA: Diagnosis not present

## 2015-10-29 DIAGNOSIS — I5031 Acute diastolic (congestive) heart failure: Secondary | ICD-10-CM | POA: Diagnosis not present

## 2015-10-29 DIAGNOSIS — I2510 Atherosclerotic heart disease of native coronary artery without angina pectoris: Secondary | ICD-10-CM | POA: Diagnosis not present

## 2015-10-29 DIAGNOSIS — N183 Chronic kidney disease, stage 3 (moderate): Secondary | ICD-10-CM | POA: Diagnosis not present

## 2015-10-29 DIAGNOSIS — T84020D Dislocation of internal right hip prosthesis, subsequent encounter: Secondary | ICD-10-CM | POA: Diagnosis not present

## 2015-10-31 DIAGNOSIS — I130 Hypertensive heart and chronic kidney disease with heart failure and stage 1 through stage 4 chronic kidney disease, or unspecified chronic kidney disease: Secondary | ICD-10-CM | POA: Diagnosis not present

## 2015-10-31 DIAGNOSIS — I5031 Acute diastolic (congestive) heart failure: Secondary | ICD-10-CM | POA: Diagnosis not present

## 2015-10-31 DIAGNOSIS — N183 Chronic kidney disease, stage 3 (moderate): Secondary | ICD-10-CM | POA: Diagnosis not present

## 2015-10-31 DIAGNOSIS — J441 Chronic obstructive pulmonary disease with (acute) exacerbation: Secondary | ICD-10-CM | POA: Diagnosis not present

## 2015-10-31 DIAGNOSIS — I2510 Atherosclerotic heart disease of native coronary artery without angina pectoris: Secondary | ICD-10-CM | POA: Diagnosis not present

## 2015-10-31 DIAGNOSIS — T84020D Dislocation of internal right hip prosthesis, subsequent encounter: Secondary | ICD-10-CM | POA: Diagnosis not present

## 2015-11-04 ENCOUNTER — Emergency Department (HOSPITAL_COMMUNITY): Payer: Medicare Other

## 2015-11-04 ENCOUNTER — Encounter (HOSPITAL_COMMUNITY): Payer: Self-pay | Admitting: Emergency Medicine

## 2015-11-04 ENCOUNTER — Emergency Department (HOSPITAL_COMMUNITY)
Admission: EM | Admit: 2015-11-04 | Discharge: 2015-11-04 | Disposition: A | Discharge status: Home / Self Care | Payer: Medicare Other | Attending: Emergency Medicine | Admitting: Emergency Medicine

## 2015-11-04 DIAGNOSIS — Y999 Unspecified external cause status: Secondary | ICD-10-CM | POA: Insufficient documentation

## 2015-11-04 DIAGNOSIS — X501XXA Overexertion from prolonged static or awkward postures, initial encounter: Secondary | ICD-10-CM | POA: Diagnosis not present

## 2015-11-04 DIAGNOSIS — Y929 Unspecified place or not applicable: Secondary | ICD-10-CM | POA: Insufficient documentation

## 2015-11-04 DIAGNOSIS — J449 Chronic obstructive pulmonary disease, unspecified: Secondary | ICD-10-CM | POA: Insufficient documentation

## 2015-11-04 DIAGNOSIS — T84020A Dislocation of internal right hip prosthesis, initial encounter: Secondary | ICD-10-CM | POA: Diagnosis not present

## 2015-11-04 DIAGNOSIS — Z950 Presence of cardiac pacemaker: Secondary | ICD-10-CM | POA: Diagnosis not present

## 2015-11-04 DIAGNOSIS — N189 Chronic kidney disease, unspecified: Secondary | ICD-10-CM | POA: Diagnosis not present

## 2015-11-04 DIAGNOSIS — Z79899 Other long term (current) drug therapy: Secondary | ICD-10-CM | POA: Diagnosis not present

## 2015-11-04 DIAGNOSIS — Y9389 Activity, other specified: Secondary | ICD-10-CM | POA: Insufficient documentation

## 2015-11-04 DIAGNOSIS — Y793 Surgical instruments, materials and orthopedic devices (including sutures) associated with adverse incidents: Secondary | ICD-10-CM | POA: Insufficient documentation

## 2015-11-04 DIAGNOSIS — I509 Heart failure, unspecified: Secondary | ICD-10-CM | POA: Insufficient documentation

## 2015-11-04 DIAGNOSIS — F17200 Nicotine dependence, unspecified, uncomplicated: Secondary | ICD-10-CM | POA: Diagnosis not present

## 2015-11-04 DIAGNOSIS — S79911A Unspecified injury of right hip, initial encounter: Secondary | ICD-10-CM | POA: Diagnosis present

## 2015-11-04 DIAGNOSIS — I130 Hypertensive heart and chronic kidney disease with heart failure and stage 1 through stage 4 chronic kidney disease, or unspecified chronic kidney disease: Secondary | ICD-10-CM | POA: Insufficient documentation

## 2015-11-04 DIAGNOSIS — E1122 Type 2 diabetes mellitus with diabetic chronic kidney disease: Secondary | ICD-10-CM | POA: Diagnosis not present

## 2015-11-04 DIAGNOSIS — S73004A Unspecified dislocation of right hip, initial encounter: Secondary | ICD-10-CM

## 2015-11-04 LAB — BASIC METABOLIC PANEL
ANION GAP: 6 (ref 5–15)
BUN: 27 mg/dL — ABNORMAL HIGH (ref 6–20)
CALCIUM: 9.4 mg/dL (ref 8.9–10.3)
CO2: 24 mmol/L (ref 22–32)
Chloride: 110 mmol/L (ref 101–111)
Creatinine, Ser: 1.3 mg/dL — ABNORMAL HIGH (ref 0.44–1.00)
GFR, EST AFRICAN AMERICAN: 44 mL/min — AB (ref >60)
GFR, EST NON AFRICAN AMERICAN: 38 mL/min — AB (ref >60)
GLUCOSE: 120 mg/dL — AB (ref 65–99)
POTASSIUM: 3.8 mmol/L (ref 3.5–5.1)
Sodium: 140 mmol/L (ref 135–145)

## 2015-11-04 LAB — CBC WITH DIFFERENTIAL/PLATELET
BASOS ABS: 0.1 10*3/uL (ref 0.0–0.1)
BASOS PCT: 1 %
Eosinophils Absolute: 0.4 10*3/uL (ref 0.0–0.7)
Eosinophils Relative: 3 %
HEMATOCRIT: 39.5 % (ref 36.0–46.0)
Hemoglobin: 13.5 g/dL (ref 12.0–15.0)
LYMPHS PCT: 23 %
Lymphs Abs: 3.1 10*3/uL (ref 0.7–4.0)
MCH: 30.7 pg (ref 26.0–34.0)
MCHC: 34.2 g/dL (ref 30.0–36.0)
MCV: 89.8 fL (ref 78.0–100.0)
MONO ABS: 1 10*3/uL (ref 0.1–1.0)
Monocytes Relative: 7 %
NEUTROS ABS: 8.8 10*3/uL — AB (ref 1.7–7.7)
Neutrophils Relative %: 66 %
PLATELETS: 252 10*3/uL (ref 150–400)
RBC: 4.4 MIL/uL (ref 3.87–5.11)
RDW: 13.9 % (ref 11.5–15.5)
WBC: 13.4 10*3/uL — AB (ref 4.0–10.5)

## 2015-11-04 MED ORDER — PROPOFOL 10 MG/ML IV BOLUS
INTRAVENOUS | Status: AC | PRN
  Administered 2015-11-04: 40 mg via INTRAVENOUS

## 2015-11-04 MED ORDER — PROPOFOL 10 MG/ML IV BOLUS
80.0000 mg | Freq: Once | INTRAVENOUS | Status: AC
  Administered 2015-11-04: 40 mg via INTRAVENOUS
  Filled 2015-11-04: qty 20

## 2015-11-04 NOTE — ED Triage Notes (Addendum)
Pt complaint of RIGHT hip dislocation last night; hx of same. Pt has strong RIGHT pedal pulse; shortening noted. Denies injury.

## 2015-11-04 NOTE — ED Notes (Signed)
Patient transported to X-ray 
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
## 2015-11-04 NOTE — Sedation Documentation (Signed)
R Hip reduced by  Dr. Laneta Simmers w/o complication

## 2015-11-04 NOTE — ED Notes (Signed)
Bed: WA08 Expected date:  Expected time:  Means of arrival:  Comments: Triage 

## 2015-11-04 NOTE — ED Notes (Signed)
Delay in labs, pt transported to xray

## 2015-11-05 DIAGNOSIS — N183 Chronic kidney disease, stage 3 (moderate): Secondary | ICD-10-CM | POA: Diagnosis not present

## 2015-11-05 DIAGNOSIS — I5031 Acute diastolic (congestive) heart failure: Secondary | ICD-10-CM | POA: Diagnosis not present

## 2015-11-05 DIAGNOSIS — I130 Hypertensive heart and chronic kidney disease with heart failure and stage 1 through stage 4 chronic kidney disease, or unspecified chronic kidney disease: Secondary | ICD-10-CM | POA: Diagnosis not present

## 2015-11-05 DIAGNOSIS — J441 Chronic obstructive pulmonary disease with (acute) exacerbation: Secondary | ICD-10-CM | POA: Diagnosis not present

## 2015-11-05 DIAGNOSIS — I2510 Atherosclerotic heart disease of native coronary artery without angina pectoris: Secondary | ICD-10-CM | POA: Diagnosis not present

## 2015-11-05 DIAGNOSIS — T84020D Dislocation of internal right hip prosthesis, subsequent encounter: Secondary | ICD-10-CM | POA: Diagnosis not present

## 2015-11-06 DIAGNOSIS — Z96641 Presence of right artificial hip joint: Secondary | ICD-10-CM | POA: Diagnosis not present

## 2015-11-06 DIAGNOSIS — Z471 Aftercare following joint replacement surgery: Secondary | ICD-10-CM | POA: Diagnosis not present

## 2015-11-07 ENCOUNTER — Other Ambulatory Visit: Payer: Self-pay | Admitting: Orthopedic Surgery

## 2015-11-07 DIAGNOSIS — I5032 Chronic diastolic (congestive) heart failure: Secondary | ICD-10-CM | POA: Diagnosis not present

## 2015-11-07 DIAGNOSIS — Z96641 Presence of right artificial hip joint: Secondary | ICD-10-CM

## 2015-11-07 DIAGNOSIS — I10 Essential (primary) hypertension: Secondary | ICD-10-CM | POA: Diagnosis not present

## 2015-11-07 DIAGNOSIS — I779 Disorder of arteries and arterioles, unspecified: Secondary | ICD-10-CM | POA: Diagnosis not present

## 2015-11-07 DIAGNOSIS — Z950 Presence of cardiac pacemaker: Secondary | ICD-10-CM | POA: Diagnosis not present

## 2015-11-07 DIAGNOSIS — E785 Hyperlipidemia, unspecified: Secondary | ICD-10-CM | POA: Diagnosis not present

## 2015-11-07 DIAGNOSIS — I2510 Atherosclerotic heart disease of native coronary artery without angina pectoris: Secondary | ICD-10-CM | POA: Diagnosis not present

## 2015-11-07 DIAGNOSIS — I480 Paroxysmal atrial fibrillation: Secondary | ICD-10-CM | POA: Diagnosis not present

## 2015-11-07 DIAGNOSIS — Z951 Presence of aortocoronary bypass graft: Secondary | ICD-10-CM | POA: Diagnosis not present

## 2015-11-08 DIAGNOSIS — Z96641 Presence of right artificial hip joint: Secondary | ICD-10-CM | POA: Diagnosis not present

## 2015-11-08 DIAGNOSIS — Z23 Encounter for immunization: Secondary | ICD-10-CM | POA: Diagnosis not present

## 2015-11-08 DIAGNOSIS — J449 Chronic obstructive pulmonary disease, unspecified: Secondary | ICD-10-CM | POA: Diagnosis not present

## 2015-11-08 DIAGNOSIS — I2510 Atherosclerotic heart disease of native coronary artery without angina pectoris: Secondary | ICD-10-CM | POA: Diagnosis not present

## 2015-11-08 DIAGNOSIS — E785 Hyperlipidemia, unspecified: Secondary | ICD-10-CM | POA: Diagnosis not present

## 2015-11-08 DIAGNOSIS — Z79891 Long term (current) use of opiate analgesic: Secondary | ICD-10-CM | POA: Diagnosis not present

## 2015-11-08 DIAGNOSIS — I10 Essential (primary) hypertension: Secondary | ICD-10-CM | POA: Diagnosis not present

## 2015-11-08 DIAGNOSIS — F419 Anxiety disorder, unspecified: Secondary | ICD-10-CM | POA: Diagnosis not present

## 2015-11-08 DIAGNOSIS — N183 Chronic kidney disease, stage 3 (moderate): Secondary | ICD-10-CM | POA: Diagnosis not present

## 2015-11-08 DIAGNOSIS — I129 Hypertensive chronic kidney disease with stage 1 through stage 4 chronic kidney disease, or unspecified chronic kidney disease: Secondary | ICD-10-CM | POA: Diagnosis not present

## 2015-11-10 DIAGNOSIS — N183 Chronic kidney disease, stage 3 (moderate): Secondary | ICD-10-CM | POA: Diagnosis not present

## 2015-11-10 DIAGNOSIS — I5031 Acute diastolic (congestive) heart failure: Secondary | ICD-10-CM | POA: Diagnosis not present

## 2015-11-10 DIAGNOSIS — I2510 Atherosclerotic heart disease of native coronary artery without angina pectoris: Secondary | ICD-10-CM | POA: Diagnosis not present

## 2015-11-10 DIAGNOSIS — I130 Hypertensive heart and chronic kidney disease with heart failure and stage 1 through stage 4 chronic kidney disease, or unspecified chronic kidney disease: Secondary | ICD-10-CM | POA: Diagnosis not present

## 2015-11-10 DIAGNOSIS — J441 Chronic obstructive pulmonary disease with (acute) exacerbation: Secondary | ICD-10-CM | POA: Diagnosis not present

## 2015-11-10 DIAGNOSIS — T84020D Dislocation of internal right hip prosthesis, subsequent encounter: Secondary | ICD-10-CM | POA: Diagnosis not present

## 2015-11-11 DIAGNOSIS — I5031 Acute diastolic (congestive) heart failure: Secondary | ICD-10-CM | POA: Diagnosis not present

## 2015-11-11 DIAGNOSIS — J441 Chronic obstructive pulmonary disease with (acute) exacerbation: Secondary | ICD-10-CM | POA: Diagnosis not present

## 2015-11-11 DIAGNOSIS — I130 Hypertensive heart and chronic kidney disease with heart failure and stage 1 through stage 4 chronic kidney disease, or unspecified chronic kidney disease: Secondary | ICD-10-CM | POA: Diagnosis not present

## 2015-11-11 DIAGNOSIS — T84020D Dislocation of internal right hip prosthesis, subsequent encounter: Secondary | ICD-10-CM | POA: Diagnosis not present

## 2015-11-11 DIAGNOSIS — I2510 Atherosclerotic heart disease of native coronary artery without angina pectoris: Secondary | ICD-10-CM | POA: Diagnosis not present

## 2015-11-11 DIAGNOSIS — N183 Chronic kidney disease, stage 3 (moderate): Secondary | ICD-10-CM | POA: Diagnosis not present

## 2015-11-12 DIAGNOSIS — T84020D Dislocation of internal right hip prosthesis, subsequent encounter: Secondary | ICD-10-CM | POA: Diagnosis not present

## 2015-11-12 DIAGNOSIS — I5031 Acute diastolic (congestive) heart failure: Secondary | ICD-10-CM | POA: Diagnosis not present

## 2015-11-12 DIAGNOSIS — I2510 Atherosclerotic heart disease of native coronary artery without angina pectoris: Secondary | ICD-10-CM | POA: Diagnosis not present

## 2015-11-12 DIAGNOSIS — I130 Hypertensive heart and chronic kidney disease with heart failure and stage 1 through stage 4 chronic kidney disease, or unspecified chronic kidney disease: Secondary | ICD-10-CM | POA: Diagnosis not present

## 2015-11-12 DIAGNOSIS — J441 Chronic obstructive pulmonary disease with (acute) exacerbation: Secondary | ICD-10-CM | POA: Diagnosis not present

## 2015-11-12 DIAGNOSIS — N183 Chronic kidney disease, stage 3 (moderate): Secondary | ICD-10-CM | POA: Diagnosis not present

## 2015-11-14 DIAGNOSIS — I5031 Acute diastolic (congestive) heart failure: Secondary | ICD-10-CM | POA: Diagnosis not present

## 2015-11-14 DIAGNOSIS — I2510 Atherosclerotic heart disease of native coronary artery without angina pectoris: Secondary | ICD-10-CM | POA: Diagnosis not present

## 2015-11-14 DIAGNOSIS — N183 Chronic kidney disease, stage 3 (moderate): Secondary | ICD-10-CM | POA: Diagnosis not present

## 2015-11-14 DIAGNOSIS — J441 Chronic obstructive pulmonary disease with (acute) exacerbation: Secondary | ICD-10-CM | POA: Diagnosis not present

## 2015-11-14 DIAGNOSIS — T84020D Dislocation of internal right hip prosthesis, subsequent encounter: Secondary | ICD-10-CM | POA: Diagnosis not present

## 2015-11-14 DIAGNOSIS — I130 Hypertensive heart and chronic kidney disease with heart failure and stage 1 through stage 4 chronic kidney disease, or unspecified chronic kidney disease: Secondary | ICD-10-CM | POA: Diagnosis not present

## 2015-11-19 DIAGNOSIS — I5031 Acute diastolic (congestive) heart failure: Secondary | ICD-10-CM | POA: Diagnosis not present

## 2015-11-19 DIAGNOSIS — I2510 Atherosclerotic heart disease of native coronary artery without angina pectoris: Secondary | ICD-10-CM | POA: Diagnosis not present

## 2015-11-19 DIAGNOSIS — I130 Hypertensive heart and chronic kidney disease with heart failure and stage 1 through stage 4 chronic kidney disease, or unspecified chronic kidney disease: Secondary | ICD-10-CM | POA: Diagnosis not present

## 2015-11-19 DIAGNOSIS — T84020D Dislocation of internal right hip prosthesis, subsequent encounter: Secondary | ICD-10-CM | POA: Diagnosis not present

## 2015-11-19 DIAGNOSIS — J441 Chronic obstructive pulmonary disease with (acute) exacerbation: Secondary | ICD-10-CM | POA: Diagnosis not present

## 2015-11-19 DIAGNOSIS — N183 Chronic kidney disease, stage 3 (moderate): Secondary | ICD-10-CM | POA: Diagnosis not present

## 2015-11-20 ENCOUNTER — Ambulatory Visit: Admission: RE | Admit: 2015-11-20 | Payer: Medicare Other | Source: Ambulatory Visit

## 2015-11-20 ENCOUNTER — Ambulatory Visit
Admission: RE | Admit: 2015-11-20 | Discharge: 2015-11-20 | Disposition: A | Discharge status: Home / Self Care | Payer: Medicare Other | Source: Ambulatory Visit | Attending: Orthopedic Surgery | Admitting: Orthopedic Surgery

## 2015-11-20 DIAGNOSIS — Z96641 Presence of right artificial hip joint: Secondary | ICD-10-CM | POA: Diagnosis present

## 2015-11-20 DIAGNOSIS — M247 Protrusio acetabuli: Secondary | ICD-10-CM | POA: Insufficient documentation

## 2015-11-20 DIAGNOSIS — M8588 Other specified disorders of bone density and structure, other site: Secondary | ICD-10-CM | POA: Diagnosis not present

## 2015-11-20 DIAGNOSIS — R937 Abnormal findings on diagnostic imaging of other parts of musculoskeletal system: Secondary | ICD-10-CM | POA: Insufficient documentation

## 2015-11-20 DIAGNOSIS — Z9889 Other specified postprocedural states: Secondary | ICD-10-CM | POA: Diagnosis not present

## 2015-11-20 DIAGNOSIS — T84020A Dislocation of internal right hip prosthesis, initial encounter: Secondary | ICD-10-CM | POA: Diagnosis not present

## 2015-11-21 DIAGNOSIS — I2510 Atherosclerotic heart disease of native coronary artery without angina pectoris: Secondary | ICD-10-CM | POA: Diagnosis not present

## 2015-11-21 DIAGNOSIS — J441 Chronic obstructive pulmonary disease with (acute) exacerbation: Secondary | ICD-10-CM | POA: Diagnosis not present

## 2015-11-21 DIAGNOSIS — I130 Hypertensive heart and chronic kidney disease with heart failure and stage 1 through stage 4 chronic kidney disease, or unspecified chronic kidney disease: Secondary | ICD-10-CM | POA: Diagnosis not present

## 2015-11-21 DIAGNOSIS — N183 Chronic kidney disease, stage 3 (moderate): Secondary | ICD-10-CM | POA: Diagnosis not present

## 2015-11-21 DIAGNOSIS — I5031 Acute diastolic (congestive) heart failure: Secondary | ICD-10-CM | POA: Diagnosis not present

## 2015-11-21 DIAGNOSIS — T84020D Dislocation of internal right hip prosthesis, subsequent encounter: Secondary | ICD-10-CM | POA: Diagnosis not present

## 2015-11-26 ENCOUNTER — Encounter: Payer: Self-pay | Admitting: Emergency Medicine

## 2015-11-26 ENCOUNTER — Emergency Department: Payer: Medicare Other

## 2015-11-26 ENCOUNTER — Emergency Department
Admission: EM | Admit: 2015-11-26 | Discharge: 2015-11-26 | Disposition: A | Discharge status: Home / Self Care | Payer: Medicare Other | Attending: Emergency Medicine | Admitting: Emergency Medicine

## 2015-11-26 DIAGNOSIS — Z96641 Presence of right artificial hip joint: Secondary | ICD-10-CM | POA: Diagnosis not present

## 2015-11-26 DIAGNOSIS — Z950 Presence of cardiac pacemaker: Secondary | ICD-10-CM | POA: Insufficient documentation

## 2015-11-26 DIAGNOSIS — I2510 Atherosclerotic heart disease of native coronary artery without angina pectoris: Secondary | ICD-10-CM | POA: Diagnosis not present

## 2015-11-26 DIAGNOSIS — E1122 Type 2 diabetes mellitus with diabetic chronic kidney disease: Secondary | ICD-10-CM | POA: Diagnosis not present

## 2015-11-26 DIAGNOSIS — X501XXA Overexertion from prolonged static or awkward postures, initial encounter: Secondary | ICD-10-CM | POA: Insufficient documentation

## 2015-11-26 DIAGNOSIS — F17200 Nicotine dependence, unspecified, uncomplicated: Secondary | ICD-10-CM | POA: Diagnosis not present

## 2015-11-26 DIAGNOSIS — T84020A Dislocation of internal right hip prosthesis, initial encounter: Secondary | ICD-10-CM | POA: Diagnosis not present

## 2015-11-26 DIAGNOSIS — I5031 Acute diastolic (congestive) heart failure: Secondary | ICD-10-CM | POA: Diagnosis not present

## 2015-11-26 DIAGNOSIS — Y9389 Activity, other specified: Secondary | ICD-10-CM | POA: Diagnosis not present

## 2015-11-26 DIAGNOSIS — N189 Chronic kidney disease, unspecified: Secondary | ICD-10-CM | POA: Diagnosis not present

## 2015-11-26 DIAGNOSIS — J449 Chronic obstructive pulmonary disease, unspecified: Secondary | ICD-10-CM | POA: Insufficient documentation

## 2015-11-26 DIAGNOSIS — I252 Old myocardial infarction: Secondary | ICD-10-CM | POA: Insufficient documentation

## 2015-11-26 DIAGNOSIS — J441 Chronic obstructive pulmonary disease with (acute) exacerbation: Secondary | ICD-10-CM | POA: Diagnosis not present

## 2015-11-26 DIAGNOSIS — Y929 Unspecified place or not applicable: Secondary | ICD-10-CM | POA: Diagnosis not present

## 2015-11-26 DIAGNOSIS — I509 Heart failure, unspecified: Secondary | ICD-10-CM | POA: Insufficient documentation

## 2015-11-26 DIAGNOSIS — S73034A Other anterior dislocation of right hip, initial encounter: Secondary | ICD-10-CM | POA: Diagnosis not present

## 2015-11-26 DIAGNOSIS — S73004S Unspecified dislocation of right hip, sequela: Secondary | ICD-10-CM | POA: Diagnosis not present

## 2015-11-26 DIAGNOSIS — N183 Chronic kidney disease, stage 3 (moderate): Secondary | ICD-10-CM | POA: Diagnosis not present

## 2015-11-26 DIAGNOSIS — I130 Hypertensive heart and chronic kidney disease with heart failure and stage 1 through stage 4 chronic kidney disease, or unspecified chronic kidney disease: Secondary | ICD-10-CM | POA: Diagnosis not present

## 2015-11-26 DIAGNOSIS — Z79899 Other long term (current) drug therapy: Secondary | ICD-10-CM | POA: Diagnosis not present

## 2015-11-26 DIAGNOSIS — Z96649 Presence of unspecified artificial hip joint: Secondary | ICD-10-CM | POA: Diagnosis not present

## 2015-11-26 DIAGNOSIS — Y999 Unspecified external cause status: Secondary | ICD-10-CM | POA: Diagnosis not present

## 2015-11-26 DIAGNOSIS — Z4789 Encounter for other orthopedic aftercare: Secondary | ICD-10-CM | POA: Diagnosis not present

## 2015-11-26 DIAGNOSIS — S79911A Unspecified injury of right hip, initial encounter: Secondary | ICD-10-CM | POA: Diagnosis present

## 2015-11-26 DIAGNOSIS — R52 Pain, unspecified: Secondary | ICD-10-CM

## 2015-11-26 DIAGNOSIS — Z471 Aftercare following joint replacement surgery: Secondary | ICD-10-CM | POA: Diagnosis not present

## 2015-11-26 DIAGNOSIS — T84020D Dislocation of internal right hip prosthesis, subsequent encounter: Secondary | ICD-10-CM | POA: Diagnosis not present

## 2015-11-26 MED ORDER — ETOMIDATE 2 MG/ML IV SOLN
INTRAVENOUS | Status: AC | PRN
  Administered 2015-11-26: 6 mg via INTRAVENOUS

## 2015-11-26 MED ORDER — ETOMIDATE 2 MG/ML IV SOLN
INTRAVENOUS | Status: AC
  Filled 2015-11-26: qty 10

## 2015-11-26 NOTE — ED Triage Notes (Signed)
Pt arrived via with daughter, was taken out of [facility] by ED staff and placed into room 11. Possible dislocation to  right hip. Pt also complains of right knee pain, knee is currently in brace.

## 2015-11-26 NOTE — Sedation Documentation (Signed)
IV flushed, MD at bedside.

## 2015-11-26 NOTE — Sedation Documentation (Signed)
Pt alert, rates pain at a 2 on a 0-10 scale

## 2015-11-26 NOTE — ED Notes (Signed)
X RAY at bedside 

## 2015-11-26 NOTE — ED Notes (Signed)
Pt alert and oriented. Family at bedside. Call light within reach. No needs expressed.

## 2015-11-26 NOTE — ED Notes (Signed)
Pt alert and oriented.
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
## 2015-11-28 ENCOUNTER — Emergency Department: Payer: Medicare Other

## 2015-11-28 ENCOUNTER — Encounter: Payer: Self-pay | Admitting: Emergency Medicine

## 2015-11-28 ENCOUNTER — Emergency Department
Admission: EM | Admit: 2015-11-28 | Discharge: 2015-11-28 | Disposition: A | Discharge status: Home / Self Care | Payer: Medicare Other | Attending: Emergency Medicine | Admitting: Emergency Medicine

## 2015-11-28 DIAGNOSIS — I130 Hypertensive heart and chronic kidney disease with heart failure and stage 1 through stage 4 chronic kidney disease, or unspecified chronic kidney disease: Secondary | ICD-10-CM | POA: Diagnosis not present

## 2015-11-28 DIAGNOSIS — Z96643 Presence of artificial hip joint, bilateral: Secondary | ICD-10-CM | POA: Insufficient documentation

## 2015-11-28 DIAGNOSIS — J449 Chronic obstructive pulmonary disease, unspecified: Secondary | ICD-10-CM | POA: Insufficient documentation

## 2015-11-28 DIAGNOSIS — Y999 Unspecified external cause status: Secondary | ICD-10-CM | POA: Insufficient documentation

## 2015-11-28 DIAGNOSIS — S73011A Posterior subluxation of right hip, initial encounter: Secondary | ICD-10-CM | POA: Diagnosis not present

## 2015-11-28 DIAGNOSIS — Y9389 Activity, other specified: Secondary | ICD-10-CM | POA: Insufficient documentation

## 2015-11-28 DIAGNOSIS — X500XXA Overexertion from strenuous movement or load, initial encounter: Secondary | ICD-10-CM | POA: Insufficient documentation

## 2015-11-28 DIAGNOSIS — S73014A Posterior dislocation of right hip, initial encounter: Secondary | ICD-10-CM | POA: Insufficient documentation

## 2015-11-28 DIAGNOSIS — E1122 Type 2 diabetes mellitus with diabetic chronic kidney disease: Secondary | ICD-10-CM | POA: Insufficient documentation

## 2015-11-28 DIAGNOSIS — M25551 Pain in right hip: Secondary | ICD-10-CM | POA: Diagnosis not present

## 2015-11-28 DIAGNOSIS — F17200 Nicotine dependence, unspecified, uncomplicated: Secondary | ICD-10-CM | POA: Diagnosis not present

## 2015-11-28 DIAGNOSIS — I509 Heart failure, unspecified: Secondary | ICD-10-CM | POA: Diagnosis not present

## 2015-11-28 DIAGNOSIS — S79911A Unspecified injury of right hip, initial encounter: Secondary | ICD-10-CM | POA: Diagnosis present

## 2015-11-28 DIAGNOSIS — Z79899 Other long term (current) drug therapy: Secondary | ICD-10-CM | POA: Diagnosis not present

## 2015-11-28 DIAGNOSIS — Z96641 Presence of right artificial hip joint: Secondary | ICD-10-CM | POA: Diagnosis not present

## 2015-11-28 DIAGNOSIS — Y92009 Unspecified place in unspecified non-institutional (private) residence as the place of occurrence of the external cause: Secondary | ICD-10-CM | POA: Diagnosis not present

## 2015-11-28 DIAGNOSIS — Z4789 Encounter for other orthopedic aftercare: Secondary | ICD-10-CM | POA: Diagnosis not present

## 2015-11-28 DIAGNOSIS — N189 Chronic kidney disease, unspecified: Secondary | ICD-10-CM | POA: Diagnosis not present

## 2015-11-28 DIAGNOSIS — T84020A Dislocation of internal right hip prosthesis, initial encounter: Secondary | ICD-10-CM | POA: Diagnosis not present

## 2015-11-28 DIAGNOSIS — T84018A Broken internal joint prosthesis, other site, initial encounter: Secondary | ICD-10-CM | POA: Diagnosis not present

## 2015-11-28 MED ORDER — MORPHINE SULFATE (PF) 2 MG/ML IV SOLN
INTRAVENOUS | Status: AC
  Administered 2015-11-28: 2 mg via INTRAVENOUS
  Filled 2015-11-28: qty 1

## 2015-11-28 MED ORDER — MORPHINE SULFATE (PF) 2 MG/ML IV SOLN
2.0000 mg | Freq: Once | INTRAVENOUS | Status: AC
  Administered 2015-11-28: 2 mg via INTRAVENOUS

## 2015-11-28 MED ORDER — ETOMIDATE 2 MG/ML IV SOLN
INTRAVENOUS | Status: AC
  Administered 2015-11-28: 10 mg via INTRAVENOUS
  Filled 2015-11-28: qty 10

## 2015-11-28 MED ORDER — ETOMIDATE 2 MG/ML IV SOLN
10.0000 mg | Freq: Once | INTRAVENOUS | Status: AC
  Administered 2015-11-28: 10 mg via INTRAVENOUS

## 2015-11-28 NOTE — ED Triage Notes (Signed)
Pt to ED with right hip pain/dislocation today.  Pt here on Monday with same.  Family states patient getting out of bed when it happened.  States hx of multiple hip surgeries and hip replacement 2007.  Pt seen at ortho today around 1330.  Pt A&Ox4, speaking in complete and coherent sentences.
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
## 2015-11-28 NOTE — Sedation Documentation (Signed)
Reduction complete

## 2015-11-28 NOTE — ED Provider Notes (Signed)
IMPRESSION: Status post reduction of right femoral head prosthesis dislocation. Mild protrusio acetabuli again noted, with mild fragmentation at the medial aspect of the right ischium.  Hip reduction was satisfactory. She'll be placed in a knee immobilizer. She is stable for outpatient follow-up with her orthopedist.   Earleen Newport, MD 11/28/15 2158

## 2015-11-29 DIAGNOSIS — I2510 Atherosclerotic heart disease of native coronary artery without angina pectoris: Secondary | ICD-10-CM | POA: Diagnosis not present

## 2015-11-29 DIAGNOSIS — I130 Hypertensive heart and chronic kidney disease with heart failure and stage 1 through stage 4 chronic kidney disease, or unspecified chronic kidney disease: Secondary | ICD-10-CM | POA: Diagnosis not present

## 2015-11-29 DIAGNOSIS — T84020D Dislocation of internal right hip prosthesis, subsequent encounter: Secondary | ICD-10-CM | POA: Diagnosis not present

## 2015-11-29 DIAGNOSIS — J441 Chronic obstructive pulmonary disease with (acute) exacerbation: Secondary | ICD-10-CM | POA: Diagnosis not present

## 2015-11-29 DIAGNOSIS — N183 Chronic kidney disease, stage 3 (moderate): Secondary | ICD-10-CM | POA: Diagnosis not present

## 2015-11-29 DIAGNOSIS — I5031 Acute diastolic (congestive) heart failure: Secondary | ICD-10-CM | POA: Diagnosis not present

## 2015-11-29 MED ORDER — HYDROMORPHONE HCL 1 MG/ML IJ SOLN
INTRAMUSCULAR | Status: DC
Start: 2015-11-29 — End: 2015-11-29
  Filled 2015-11-29: qty 1

## 2015-11-29 MED ORDER — ONDANSETRON HCL 4 MG/2ML IJ SOLN
INTRAMUSCULAR | Status: AC
  Filled 2015-11-29: qty 2

## 2015-12-02 DIAGNOSIS — N183 Chronic kidney disease, stage 3 (moderate): Secondary | ICD-10-CM | POA: Diagnosis not present

## 2015-12-02 DIAGNOSIS — I5031 Acute diastolic (congestive) heart failure: Secondary | ICD-10-CM | POA: Diagnosis not present

## 2015-12-02 DIAGNOSIS — I2510 Atherosclerotic heart disease of native coronary artery without angina pectoris: Secondary | ICD-10-CM | POA: Diagnosis not present

## 2015-12-02 DIAGNOSIS — J441 Chronic obstructive pulmonary disease with (acute) exacerbation: Secondary | ICD-10-CM | POA: Diagnosis not present

## 2015-12-02 DIAGNOSIS — T84020D Dislocation of internal right hip prosthesis, subsequent encounter: Secondary | ICD-10-CM | POA: Diagnosis not present

## 2015-12-02 DIAGNOSIS — I130 Hypertensive heart and chronic kidney disease with heart failure and stage 1 through stage 4 chronic kidney disease, or unspecified chronic kidney disease: Secondary | ICD-10-CM | POA: Diagnosis not present

## 2015-12-03 DIAGNOSIS — J441 Chronic obstructive pulmonary disease with (acute) exacerbation: Secondary | ICD-10-CM | POA: Diagnosis not present

## 2015-12-03 DIAGNOSIS — N183 Chronic kidney disease, stage 3 (moderate): Secondary | ICD-10-CM | POA: Diagnosis not present

## 2015-12-03 DIAGNOSIS — I2510 Atherosclerotic heart disease of native coronary artery without angina pectoris: Secondary | ICD-10-CM | POA: Diagnosis not present

## 2015-12-03 DIAGNOSIS — T84020D Dislocation of internal right hip prosthesis, subsequent encounter: Secondary | ICD-10-CM | POA: Diagnosis not present

## 2015-12-03 DIAGNOSIS — I130 Hypertensive heart and chronic kidney disease with heart failure and stage 1 through stage 4 chronic kidney disease, or unspecified chronic kidney disease: Secondary | ICD-10-CM | POA: Diagnosis not present

## 2015-12-03 DIAGNOSIS — I5031 Acute diastolic (congestive) heart failure: Secondary | ICD-10-CM | POA: Diagnosis not present

## 2015-12-04 DIAGNOSIS — I130 Hypertensive heart and chronic kidney disease with heart failure and stage 1 through stage 4 chronic kidney disease, or unspecified chronic kidney disease: Secondary | ICD-10-CM | POA: Diagnosis not present

## 2015-12-05 DIAGNOSIS — I2510 Atherosclerotic heart disease of native coronary artery without angina pectoris: Secondary | ICD-10-CM | POA: Diagnosis not present

## 2015-12-05 DIAGNOSIS — J441 Chronic obstructive pulmonary disease with (acute) exacerbation: Secondary | ICD-10-CM | POA: Diagnosis not present

## 2015-12-05 DIAGNOSIS — I130 Hypertensive heart and chronic kidney disease with heart failure and stage 1 through stage 4 chronic kidney disease, or unspecified chronic kidney disease: Secondary | ICD-10-CM | POA: Diagnosis not present

## 2015-12-05 DIAGNOSIS — N183 Chronic kidney disease, stage 3 (moderate): Secondary | ICD-10-CM | POA: Diagnosis not present

## 2015-12-05 DIAGNOSIS — I5031 Acute diastolic (congestive) heart failure: Secondary | ICD-10-CM | POA: Diagnosis not present

## 2015-12-05 DIAGNOSIS — T84020D Dislocation of internal right hip prosthesis, subsequent encounter: Secondary | ICD-10-CM | POA: Diagnosis not present

## 2015-12-09 DIAGNOSIS — J441 Chronic obstructive pulmonary disease with (acute) exacerbation: Secondary | ICD-10-CM | POA: Diagnosis not present

## 2015-12-09 DIAGNOSIS — N183 Chronic kidney disease, stage 3 (moderate): Secondary | ICD-10-CM | POA: Diagnosis not present

## 2015-12-09 DIAGNOSIS — I5031 Acute diastolic (congestive) heart failure: Secondary | ICD-10-CM | POA: Diagnosis not present

## 2015-12-09 DIAGNOSIS — I2510 Atherosclerotic heart disease of native coronary artery without angina pectoris: Secondary | ICD-10-CM | POA: Diagnosis not present

## 2015-12-09 DIAGNOSIS — I130 Hypertensive heart and chronic kidney disease with heart failure and stage 1 through stage 4 chronic kidney disease, or unspecified chronic kidney disease: Secondary | ICD-10-CM | POA: Diagnosis not present

## 2015-12-09 DIAGNOSIS — T84020D Dislocation of internal right hip prosthesis, subsequent encounter: Secondary | ICD-10-CM | POA: Diagnosis not present

## 2015-12-10 DIAGNOSIS — N183 Chronic kidney disease, stage 3 (moderate): Secondary | ICD-10-CM | POA: Diagnosis not present

## 2015-12-10 DIAGNOSIS — I130 Hypertensive heart and chronic kidney disease with heart failure and stage 1 through stage 4 chronic kidney disease, or unspecified chronic kidney disease: Secondary | ICD-10-CM | POA: Diagnosis not present

## 2015-12-10 DIAGNOSIS — I5031 Acute diastolic (congestive) heart failure: Secondary | ICD-10-CM | POA: Diagnosis not present

## 2015-12-10 DIAGNOSIS — T84020D Dislocation of internal right hip prosthesis, subsequent encounter: Secondary | ICD-10-CM | POA: Diagnosis not present

## 2015-12-10 DIAGNOSIS — I2510 Atherosclerotic heart disease of native coronary artery without angina pectoris: Secondary | ICD-10-CM | POA: Diagnosis not present

## 2015-12-10 DIAGNOSIS — J441 Chronic obstructive pulmonary disease with (acute) exacerbation: Secondary | ICD-10-CM | POA: Diagnosis not present

## 2015-12-11 DIAGNOSIS — I130 Hypertensive heart and chronic kidney disease with heart failure and stage 1 through stage 4 chronic kidney disease, or unspecified chronic kidney disease: Secondary | ICD-10-CM | POA: Diagnosis not present

## 2015-12-11 DIAGNOSIS — J441 Chronic obstructive pulmonary disease with (acute) exacerbation: Secondary | ICD-10-CM | POA: Diagnosis not present

## 2015-12-11 DIAGNOSIS — N183 Chronic kidney disease, stage 3 (moderate): Secondary | ICD-10-CM | POA: Diagnosis not present

## 2015-12-11 DIAGNOSIS — T84020D Dislocation of internal right hip prosthesis, subsequent encounter: Secondary | ICD-10-CM | POA: Diagnosis not present

## 2015-12-11 DIAGNOSIS — I5031 Acute diastolic (congestive) heart failure: Secondary | ICD-10-CM | POA: Diagnosis not present

## 2015-12-11 DIAGNOSIS — I2510 Atherosclerotic heart disease of native coronary artery without angina pectoris: Secondary | ICD-10-CM | POA: Diagnosis not present

## 2015-12-12 DIAGNOSIS — T84020D Dislocation of internal right hip prosthesis, subsequent encounter: Secondary | ICD-10-CM | POA: Diagnosis not present

## 2015-12-12 DIAGNOSIS — I5031 Acute diastolic (congestive) heart failure: Secondary | ICD-10-CM | POA: Diagnosis not present

## 2015-12-12 DIAGNOSIS — I2510 Atherosclerotic heart disease of native coronary artery without angina pectoris: Secondary | ICD-10-CM | POA: Diagnosis not present

## 2015-12-12 DIAGNOSIS — I130 Hypertensive heart and chronic kidney disease with heart failure and stage 1 through stage 4 chronic kidney disease, or unspecified chronic kidney disease: Secondary | ICD-10-CM | POA: Diagnosis not present

## 2015-12-12 DIAGNOSIS — N183 Chronic kidney disease, stage 3 (moderate): Secondary | ICD-10-CM | POA: Diagnosis not present

## 2015-12-12 DIAGNOSIS — J441 Chronic obstructive pulmonary disease with (acute) exacerbation: Secondary | ICD-10-CM | POA: Diagnosis not present

## 2015-12-16 ENCOUNTER — Ambulatory Visit (INDEPENDENT_AMBULATORY_CARE_PROVIDER_SITE_OTHER): Payer: Medicare Other | Admitting: Internal Medicine

## 2015-12-16 ENCOUNTER — Encounter: Payer: Self-pay | Admitting: Internal Medicine

## 2015-12-16 VITALS — BP 150/62 | HR 66 | Temp 98.1°F | Ht 64.0 in | Wt 137.5 lb

## 2015-12-16 DIAGNOSIS — I25810 Atherosclerosis of coronary artery bypass graft(s) without angina pectoris: Secondary | ICD-10-CM

## 2015-12-16 DIAGNOSIS — T84020D Dislocation of internal right hip prosthesis, subsequent encounter: Secondary | ICD-10-CM | POA: Diagnosis not present

## 2015-12-16 DIAGNOSIS — N189 Chronic kidney disease, unspecified: Secondary | ICD-10-CM

## 2015-12-16 DIAGNOSIS — N183 Chronic kidney disease, stage 3 (moderate): Secondary | ICD-10-CM | POA: Diagnosis not present

## 2015-12-16 DIAGNOSIS — K5901 Slow transit constipation: Secondary | ICD-10-CM

## 2015-12-16 DIAGNOSIS — I482 Chronic atrial fibrillation, unspecified: Secondary | ICD-10-CM

## 2015-12-16 DIAGNOSIS — I509 Heart failure, unspecified: Secondary | ICD-10-CM

## 2015-12-16 DIAGNOSIS — E119 Type 2 diabetes mellitus without complications: Secondary | ICD-10-CM

## 2015-12-16 DIAGNOSIS — E7800 Pure hypercholesterolemia, unspecified: Secondary | ICD-10-CM

## 2015-12-16 DIAGNOSIS — I2510 Atherosclerotic heart disease of native coronary artery without angina pectoris: Secondary | ICD-10-CM | POA: Diagnosis not present

## 2015-12-16 DIAGNOSIS — K219 Gastro-esophageal reflux disease without esophagitis: Secondary | ICD-10-CM

## 2015-12-16 DIAGNOSIS — I10 Essential (primary) hypertension: Secondary | ICD-10-CM

## 2015-12-16 DIAGNOSIS — F329 Major depressive disorder, single episode, unspecified: Secondary | ICD-10-CM | POA: Insufficient documentation

## 2015-12-16 DIAGNOSIS — I5031 Acute diastolic (congestive) heart failure: Secondary | ICD-10-CM | POA: Diagnosis not present

## 2015-12-16 DIAGNOSIS — M25551 Pain in right hip: Secondary | ICD-10-CM

## 2015-12-16 DIAGNOSIS — I130 Hypertensive heart and chronic kidney disease with heart failure and stage 1 through stage 4 chronic kidney disease, or unspecified chronic kidney disease: Secondary | ICD-10-CM | POA: Diagnosis not present

## 2015-12-16 DIAGNOSIS — F32A Depression, unspecified: Secondary | ICD-10-CM | POA: Insufficient documentation

## 2015-12-16 DIAGNOSIS — E1122 Type 2 diabetes mellitus with diabetic chronic kidney disease: Secondary | ICD-10-CM | POA: Insufficient documentation

## 2015-12-16 DIAGNOSIS — F411 Generalized anxiety disorder: Secondary | ICD-10-CM

## 2015-12-16 DIAGNOSIS — F419 Anxiety disorder, unspecified: Secondary | ICD-10-CM

## 2015-12-16 DIAGNOSIS — J441 Chronic obstructive pulmonary disease with (acute) exacerbation: Secondary | ICD-10-CM | POA: Diagnosis not present

## 2015-12-16 DIAGNOSIS — J449 Chronic obstructive pulmonary disease, unspecified: Secondary | ICD-10-CM

## 2015-12-16 NOTE — Assessment & Plan Note (Signed)
Will request CMET, BNP, ECG and echo from previous provider Continue Amlodipine, Monopril, HCTZ and Metoprolol for now She plans to establish care with Dr. Fletcher Anon

## 2015-12-16 NOTE — Assessment & Plan Note (Signed)
Will request A1C from previous provider No microalbumin needed due to ACEI therapy Encouraged her to get yearly eye exams Will check immunizations status from previous PCP No medications at this time Foot exam today

## 2015-12-16 NOTE — Assessment & Plan Note (Signed)
Intermittent Try to avoid triggers Continue Maalox as needed

## 2015-12-16 NOTE — Assessment & Plan Note (Signed)
Will request Lipid profile from previous provider Continue Imdur and Lipitor at this time

## 2015-12-16 NOTE — Assessment & Plan Note (Signed)
Controlled with Metamucil and Colace

## 2015-12-16 NOTE — Assessment & Plan Note (Signed)
I would like to wean her off the Ativan and switch her to Zoloft Will discuss this at the next visit

## 2015-12-16 NOTE — Assessment & Plan Note (Signed)
Encouraged her to stop smoking No need for inhalers at this time

## 2015-12-16 NOTE — Assessment & Plan Note (Signed)
Will request lipid profile and CMET from previous provider Encouraged her to consume a low fat diet Continue Lipitor for now

## 2015-12-16 NOTE — Assessment & Plan Note (Signed)
She will continue to follow with Dr. Alvan Dame Surgery scheduled for 01/13/16

## 2015-12-16 NOTE — Assessment & Plan Note (Signed)
Controlled on Amlodipine and Monopril Will request CMET and ECG from previous provider

## 2015-12-16 NOTE — Progress Notes (Signed)
HPI  Pt presents to the clinic today to establish care and for management of the conditions listed below. She is transferring care from Dr. Tommy Rainwater, in [place], [place].  Anxiety: Triggered by death of a child, general life stress. She takes the Ativan 1-2 times a day. She has never taken anything else for her anxiety. She does feel down at times but denies depression, SI/HI.  CHF: She takes Amlodipine, Monopril, Metoprolol and HCTZ as prescribed. She intermittently has swelling in her legs, this improves with elevation. She denies shortness of breath. She will be establishing care with Dr. Fletcher Anon.  CKD: She is not sure what her last creatinine was. She last had her labs drawn in September 2017.  Constipation: Her bowels are moving normally with Metamucil and Colace.  COPD: She is still smoking < 1/2 ppd. She reports she is going to quit. She denies shortness of breath. She is not taking any inhalers.  DM 2: She does not check her sugars. She is not sure what her last A1C was, but reports it was drawn September 2017. She does not take any medication for her diabetes. She sees an eye doctor annually. She reports her immunizations are UTD. She does not check her feet daily.  Afib: She reports she has a pacemaker now. She denies palpitations. She takes Metoprolol and Eliquis as prescribed. She will be establishing care with Dr. Fletcher Anon.  GERD: She is not sure what triggers this. She will take some Maalox OTC when needed with good relief.   HTN: Her BP is well controlled on Amlodipine and Monopril. Her BP today is 150/62.  HLD with CAD: Her cholesterol was last checked in September 2017. She takes Imdur and Lipitor as prescribed. She denies chest pain. She tries to consume a low fat diet.  Right hip pain: s/p right THR x 2. She is working with Dr. Alvan Dame to have this replaced again secondary to multiple dislocations.  Flu: 11/2015 Tetanus: < 10 years ago Pneumovax: UTD Prevnar: unsure Zostovax;  unsure Mammogram: 2016 at [place]: unsure Colon Screening: She thinks it was in 2013 Vision Screening: 08/2014 Dentist: as needed  Past Medical History:  Diagnosis Date  . Anxiety   . Chest pressure    "for years" per daughter  . CHF (congestive heart failure) ([place])   . Chronic kidney disease    CRF  . Constipation   . COPD (chronic obstructive pulmonary disease) ([place])   . Diabetes mellitus without complication (HCC)    Borderline  . Dysrhythmia    afib noted on 07/26/14 PPM interrogation; converted to SR after 2 weeks Multaq which was d/c'd due to GI side effects  . GERD (gastroesophageal reflux disease)   . Head injury, closed, with brief LOC ([place]) 2005  . HOH (hard of hearing)   . Hyperlipemia   . Hypertension   . Myocardial infarction 2006  . Osteoarthritis   . Presence of permanent cardiac pacemaker    dual chamber Medtronic PPM 07/29/11 [facility], [place], [place])  . Shortness of breath dyspnea     Current Outpatient Prescriptions  Medication Sig Dispense Refill  . amLODipine (NORVASC) 10 MG tablet Take 10 mg by mouth daily with breakfast.   2  . apixaban (ELIQUIS) 2.5 MG TABS tablet Take 2.5 mg by mouth 2 (two) times daily.    Marland Kitchen atorvastatin (LIPITOR) 20 MG tablet Take 20 mg by mouth every evening.  6  . cholecalciferol (VITAMIN D) 1000 units tablet Take 1,000  Units by mouth daily.    Marland Kitchen docusate sodium (COLACE) 100 MG capsule Take 100 mg by mouth 2 (two) times daily.    . fosinopril (MONOPRIL) 40 MG tablet Take 40 mg by mouth 2 (two) times daily.   1  . hydrochlorothiazide (HYDRODIURIL) 25 MG tablet Take 25 mg by mouth daily.    Marland Kitchen HYDROcodone-acetaminophen (NORCO/VICODIN) 5-325 MG per tablet Take 1 tablet by mouth 3 (three) times daily as needed for moderate pain.   0  . isosorbide mononitrate (IMDUR) 120 MG 24 hr tablet Take 120 mg by mouth daily with breakfast.   11  . LORazepam (ATIVAN) 0.5 MG tablet Take 0.5 mg by mouth 3 (three) times daily as  needed for anxiety.   5  . metoprolol succinate (TOPROL-XL) 100 MG 24 hr tablet Take 50-100 mg by mouth 2 (two) times daily. Take 1 tablet (100mg ) in the morning, and 1/2 tablet (50mg ) in the afternoon  6  . psyllium (METAMUCIL) 0.52 G capsule Take 0.52 g by mouth daily with breakfast.     . psyllium (METAMUCIL) 58.6 % packet Take 1 packet by mouth at bedtime.    . vitamin C (ASCORBIC ACID) 500 MG tablet Take 1 tablet (500 mg total) by mouth daily. 50 tablet 0   No current facility-administered medications for this visit.     Allergies  Allergen Reactions  . Aspirin Other (See Comments)    Stomach burning  Can only take coated  . Daypro [Oxaprozin] Other (See Comments)    Dizziness Affects driving  . Multaq [Dronedarone] Diarrhea    Family History  Problem Relation Age of Onset  . Stroke Mother   . Hypertension Mother   . Hyperlipidemia Mother   . Heart disease Mother   . Hyperlipidemia Father   . Kidney disease Father   . Colon cancer Sister   . Hyperlipidemia Sister   . Heart disease Sister   . Hypertension Sister   . Kidney disease Sister   . Diabetes Sister   . Hyperlipidemia Brother   . Hypertension Brother   . Kidney disease Brother   . Diabetes Brother   . Hyperlipidemia Sister   . Heart disease Sister   . Hypertension Sister   . Diabetes Sister   . Hyperlipidemia Brother   . Heart disease Brother   . Hypertension Brother   . Kidney disease Brother   . Diabetes Brother   . Hyperlipidemia Brother   . Hypertension Brother     Social History   Social History  . Marital status: Single    Spouse name: N/A  . Number of children: N/A  . Years of education: N/A   Occupational History  . Not on file.   Social History Main Topics  . Smoking status: Current Every Day Smoker    Packs/day: 0.50    Years: 67.00  . Smokeless tobacco: Never Used  . Alcohol use No  . Drug use: No  . Sexual activity: Not on file   Other Topics Concern  . Not on file    Social History Narrative  . No narrative on file    ROS:  Constitutional: Denies fever, malaise, fatigue, headache or abrupt weight changes.  HEENT: Denies eye pain, eye redness, ear pain, ringing in the ears, wax buildup, runny nose, nasal congestion, bloody nose, or sore throat. Respiratory: Denies difficulty breathing, shortness of breath, cough or sputum production.   Cardiovascular: Pt reports intermittent swelling in her legs. Denies chest pain, chest tightness,  palpitations or swelling in the hands.  Gastrointestinal: Pt reports occasional reflux. Denies abdominal pain, bloating, constipation, diarrhea or blood in the stool.  GU: Denies frequency, urgency, pain with urination, blood in urine, odor or discharge. Musculoskeletal: Pt reports right hip pain. Denies decrease in range of motion, difficulty with gait, muscle pain or joint swelling.  Skin: Denies redness, rashes, lesions or ulcercations.  Neurological: Pt reports difficulty with balance. Denies dizziness, difficulty with memory, difficulty with speech or problems with coordination.  Psych: Pt reports anxiety. Denies depression, SI/HI.  No other specific complaints in a complete review of systems (except as listed in HPI above).  PE:  BP (!) 150/62   Pulse 66   Temp 98.1 F (36.7 C) (Oral)   Ht 5\' 4"  (1.626 m)   Wt 137 lb 8 oz (62.4 kg)   SpO2 97%   BMI 23.60 kg/m  Wt Readings from Last 3 Encounters:  12/16/15 137 lb 8 oz (62.4 kg)  11/28/15 133 lb (60.3 kg)  11/26/15 134 lb 14.7 oz (61.2 kg)    General: Appears her stated age, chronically ill appearing, in NAD. Skin: Dry and intact. HEENT: Head: normal shape and size; Eyes: sclera white, no icterus, conjunctiva pink, PERRLA and EOMs intact;  Neck: Neck supple, trachea midline. No masses, lumps or thyromegaly present.  Cardiovascular: Normal rate and rhythm. S1,S2 noted.  No murmur, rubs or gallops noted. No JVD or BLE edema. No carotid bruits  noted. Pulmonary/Chest: Normal effort and positive vesicular breath sounds. No respiratory distress. No wheezes, rales or ronchi noted.  Abdomen: Soft and nontender. Normal bowel sounds. Musculoskeletal: Walking with rollator for assistance with gait.  Neurological: Alert and oriented.  Psychiatric: Mood and affect normal. Behavior is normal. Judgment and thought content normal.     BMET    Component Value Date/Time   NA 140 11/04/2015 1600   K 3.8 11/04/2015 1600   CL 110 11/04/2015 1600   CO2 24 11/04/2015 1600   GLUCOSE 120 (H) 11/04/2015 1600   BUN 27 (H) 11/04/2015 1600   CREATININE 1.30 (H) 11/04/2015 1600   CALCIUM 9.4 11/04/2015 1600   GFRNONAA 38 (L) 11/04/2015 1600   GFRAA 44 (L) 11/04/2015 1600    Lipid Panel  No results found for: CHOL, TRIG, HDL, CHOLHDL, VLDL, LDLCALC  CBC    Component Value Date/Time   WBC 13.4 (H) 11/04/2015 1600   RBC 4.40 11/04/2015 1600   HGB 13.5 11/04/2015 1600   HCT 39.5 11/04/2015 1600   PLT 252 11/04/2015 1600   MCV 89.8 11/04/2015 1600   MCH 30.7 11/04/2015 1600   MCHC 34.2 11/04/2015 1600   RDW 13.9 11/04/2015 1600   LYMPHSABS 3.1 11/04/2015 1600   MONOABS 1.0 11/04/2015 1600   EOSABS 0.4 11/04/2015 1600   BASOSABS 0.1 11/04/2015 1600    Hgb A1C No results found for: HGBA1C   Assessment and Plan:

## 2015-12-16 NOTE — Assessment & Plan Note (Signed)
Will get CMET from previous provider

## 2015-12-16 NOTE — Assessment & Plan Note (Signed)
S/p pacemaker Continue Metoprolol and Eliquis She will establish care with Dr. Fletcher Anon

## 2015-12-16 NOTE — Patient Instructions (Signed)

## 2015-12-17 DIAGNOSIS — I2510 Atherosclerotic heart disease of native coronary artery without angina pectoris: Secondary | ICD-10-CM | POA: Diagnosis not present

## 2015-12-17 DIAGNOSIS — T84020D Dislocation of internal right hip prosthesis, subsequent encounter: Secondary | ICD-10-CM | POA: Diagnosis not present

## 2015-12-17 DIAGNOSIS — N183 Chronic kidney disease, stage 3 (moderate): Secondary | ICD-10-CM | POA: Diagnosis not present

## 2015-12-17 DIAGNOSIS — J441 Chronic obstructive pulmonary disease with (acute) exacerbation: Secondary | ICD-10-CM | POA: Diagnosis not present

## 2015-12-17 DIAGNOSIS — I5031 Acute diastolic (congestive) heart failure: Secondary | ICD-10-CM | POA: Diagnosis not present

## 2015-12-17 DIAGNOSIS — I130 Hypertensive heart and chronic kidney disease with heart failure and stage 1 through stage 4 chronic kidney disease, or unspecified chronic kidney disease: Secondary | ICD-10-CM | POA: Diagnosis not present

## 2015-12-19 DIAGNOSIS — N183 Chronic kidney disease, stage 3 (moderate): Secondary | ICD-10-CM | POA: Diagnosis not present

## 2015-12-19 DIAGNOSIS — I2510 Atherosclerotic heart disease of native coronary artery without angina pectoris: Secondary | ICD-10-CM | POA: Diagnosis not present

## 2015-12-19 DIAGNOSIS — I130 Hypertensive heart and chronic kidney disease with heart failure and stage 1 through stage 4 chronic kidney disease, or unspecified chronic kidney disease: Secondary | ICD-10-CM | POA: Diagnosis not present

## 2015-12-19 DIAGNOSIS — I5031 Acute diastolic (congestive) heart failure: Secondary | ICD-10-CM | POA: Diagnosis not present

## 2015-12-19 DIAGNOSIS — J441 Chronic obstructive pulmonary disease with (acute) exacerbation: Secondary | ICD-10-CM | POA: Diagnosis not present

## 2015-12-19 DIAGNOSIS — T84020D Dislocation of internal right hip prosthesis, subsequent encounter: Secondary | ICD-10-CM | POA: Diagnosis not present

## 2015-12-20 ENCOUNTER — Encounter: Payer: Self-pay | Admitting: Emergency Medicine

## 2015-12-20 ENCOUNTER — Emergency Department: Payer: Medicare Other

## 2015-12-20 ENCOUNTER — Emergency Department
Admission: EM | Admit: 2015-12-20 | Discharge: 2015-12-20 | Disposition: A | Discharge status: Home / Self Care | Payer: Medicare Other | Attending: Emergency Medicine | Admitting: Emergency Medicine

## 2015-12-20 DIAGNOSIS — I130 Hypertensive heart and chronic kidney disease with heart failure and stage 1 through stage 4 chronic kidney disease, or unspecified chronic kidney disease: Secondary | ICD-10-CM | POA: Insufficient documentation

## 2015-12-20 DIAGNOSIS — J449 Chronic obstructive pulmonary disease, unspecified: Secondary | ICD-10-CM | POA: Insufficient documentation

## 2015-12-20 DIAGNOSIS — T84020A Dislocation of internal right hip prosthesis, initial encounter: Secondary | ICD-10-CM | POA: Diagnosis not present

## 2015-12-20 DIAGNOSIS — T84020D Dislocation of internal right hip prosthesis, subsequent encounter: Secondary | ICD-10-CM | POA: Diagnosis not present

## 2015-12-20 DIAGNOSIS — Y999 Unspecified external cause status: Secondary | ICD-10-CM | POA: Diagnosis not present

## 2015-12-20 DIAGNOSIS — I2510 Atherosclerotic heart disease of native coronary artery without angina pectoris: Secondary | ICD-10-CM | POA: Diagnosis not present

## 2015-12-20 DIAGNOSIS — N189 Chronic kidney disease, unspecified: Secondary | ICD-10-CM | POA: Diagnosis not present

## 2015-12-20 DIAGNOSIS — N183 Chronic kidney disease, stage 3 (moderate): Secondary | ICD-10-CM | POA: Diagnosis not present

## 2015-12-20 DIAGNOSIS — S73004A Unspecified dislocation of right hip, initial encounter: Secondary | ICD-10-CM | POA: Diagnosis not present

## 2015-12-20 DIAGNOSIS — F17200 Nicotine dependence, unspecified, uncomplicated: Secondary | ICD-10-CM | POA: Insufficient documentation

## 2015-12-20 DIAGNOSIS — Y939 Activity, unspecified: Secondary | ICD-10-CM | POA: Insufficient documentation

## 2015-12-20 DIAGNOSIS — J441 Chronic obstructive pulmonary disease with (acute) exacerbation: Secondary | ICD-10-CM | POA: Diagnosis not present

## 2015-12-20 DIAGNOSIS — S79911A Unspecified injury of right hip, initial encounter: Secondary | ICD-10-CM | POA: Diagnosis present

## 2015-12-20 DIAGNOSIS — I509 Heart failure, unspecified: Secondary | ICD-10-CM | POA: Insufficient documentation

## 2015-12-20 DIAGNOSIS — Z79899 Other long term (current) drug therapy: Secondary | ICD-10-CM | POA: Diagnosis not present

## 2015-12-20 DIAGNOSIS — X58XXXA Exposure to other specified factors, initial encounter: Secondary | ICD-10-CM | POA: Insufficient documentation

## 2015-12-20 DIAGNOSIS — I5031 Acute diastolic (congestive) heart failure: Secondary | ICD-10-CM | POA: Diagnosis not present

## 2015-12-20 DIAGNOSIS — Y92481 Parking lot as the place of occurrence of the external cause: Secondary | ICD-10-CM | POA: Insufficient documentation

## 2015-12-20 MED ORDER — ETOMIDATE 2 MG/ML IV SOLN
4.0000 mg | Freq: Once | INTRAVENOUS | Status: AC
  Administered 2015-12-20: 4 mg via INTRAVENOUS
  Filled 2015-12-20: qty 10
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
## 2015-12-20 NOTE — ED Triage Notes (Signed)
Pt to rm 10 directly from parking lot, report dislocated R hip, has happened in past.  Shortening and internal rotation noted.  CSM intact.  Pt NAD at this time.

## 2015-12-20 NOTE — Sedation Documentation (Signed)
Per Dr. Owens Shark, hip reduced,, procedure end

## 2015-12-20 NOTE — ED Notes (Signed)
X-ray at bedside

## 2015-12-24 ENCOUNTER — Encounter: Payer: Self-pay | Admitting: Internal Medicine

## 2015-12-26 DIAGNOSIS — T84020D Dislocation of internal right hip prosthesis, subsequent encounter: Secondary | ICD-10-CM | POA: Diagnosis not present

## 2015-12-26 DIAGNOSIS — J441 Chronic obstructive pulmonary disease with (acute) exacerbation: Secondary | ICD-10-CM | POA: Diagnosis not present

## 2015-12-26 DIAGNOSIS — I130 Hypertensive heart and chronic kidney disease with heart failure and stage 1 through stage 4 chronic kidney disease, or unspecified chronic kidney disease: Secondary | ICD-10-CM | POA: Diagnosis not present

## 2015-12-26 DIAGNOSIS — N183 Chronic kidney disease, stage 3 (moderate): Secondary | ICD-10-CM | POA: Diagnosis not present

## 2015-12-26 DIAGNOSIS — I5031 Acute diastolic (congestive) heart failure: Secondary | ICD-10-CM | POA: Diagnosis not present

## 2015-12-26 DIAGNOSIS — I2510 Atherosclerotic heart disease of native coronary artery without angina pectoris: Secondary | ICD-10-CM | POA: Diagnosis not present

## 2015-12-30 ENCOUNTER — Inpatient Hospital Stay (HOSPITAL_COMMUNITY)
Admission: RE | Admit: 2015-12-30 | Discharge: 2016-01-03 | DRG: 291 | Disposition: A | Discharge status: Home Health | Payer: Medicare Other | Source: Other Acute Inpatient Hospital | Attending: Family Medicine | Admitting: Family Medicine

## 2015-12-30 ENCOUNTER — Encounter: Payer: Self-pay | Admitting: Internal Medicine

## 2015-12-30 ENCOUNTER — Inpatient Hospital Stay (HOSPITAL_COMMUNITY)
Admission: AD | Admit: 2015-12-30 | Payer: Self-pay | Source: Other Acute Inpatient Hospital | Admitting: Internal Medicine

## 2015-12-30 ENCOUNTER — Encounter (HOSPITAL_COMMUNITY): Payer: Self-pay | Admitting: Internal Medicine

## 2015-12-30 DIAGNOSIS — Z886 Allergy status to analgesic agent status: Secondary | ICD-10-CM | POA: Diagnosis not present

## 2015-12-30 DIAGNOSIS — H9190 Unspecified hearing loss, unspecified ear: Secondary | ICD-10-CM | POA: Diagnosis present

## 2015-12-30 DIAGNOSIS — Z862 Personal history of diseases of the blood and blood-forming organs and certain disorders involving the immune mechanism: Secondary | ICD-10-CM | POA: Diagnosis present

## 2015-12-30 DIAGNOSIS — M25559 Pain in unspecified hip: Secondary | ICD-10-CM | POA: Diagnosis not present

## 2015-12-30 DIAGNOSIS — Z888 Allergy status to other drugs, medicaments and biological substances status: Secondary | ICD-10-CM

## 2015-12-30 DIAGNOSIS — Z96649 Presence of unspecified artificial hip joint: Secondary | ICD-10-CM | POA: Diagnosis not present

## 2015-12-30 DIAGNOSIS — Z8249 Family history of ischemic heart disease and other diseases of the circulatory system: Secondary | ICD-10-CM | POA: Diagnosis not present

## 2015-12-30 DIAGNOSIS — R34 Anuria and oliguria: Secondary | ICD-10-CM

## 2015-12-30 DIAGNOSIS — E119 Type 2 diabetes mellitus without complications: Secondary | ICD-10-CM

## 2015-12-30 DIAGNOSIS — I482 Chronic atrial fibrillation, unspecified: Secondary | ICD-10-CM | POA: Diagnosis present

## 2015-12-30 DIAGNOSIS — I083 Combined rheumatic disorders of mitral, aortic and tricuspid valves: Secondary | ICD-10-CM | POA: Diagnosis present

## 2015-12-30 DIAGNOSIS — I130 Hypertensive heart and chronic kidney disease with heart failure and stage 1 through stage 4 chronic kidney disease, or unspecified chronic kidney disease: Secondary | ICD-10-CM | POA: Diagnosis present

## 2015-12-30 DIAGNOSIS — N183 Chronic kidney disease, stage 3 unspecified: Secondary | ICD-10-CM | POA: Diagnosis present

## 2015-12-30 DIAGNOSIS — S73004A Unspecified dislocation of right hip, initial encounter: Secondary | ICD-10-CM | POA: Insufficient documentation

## 2015-12-30 DIAGNOSIS — Z01810 Encounter for preprocedural cardiovascular examination: Secondary | ICD-10-CM | POA: Diagnosis not present

## 2015-12-30 DIAGNOSIS — N179 Acute kidney failure, unspecified: Secondary | ICD-10-CM | POA: Diagnosis not present

## 2015-12-30 DIAGNOSIS — I5033 Acute on chronic diastolic (congestive) heart failure: Secondary | ICD-10-CM | POA: Diagnosis present

## 2015-12-30 DIAGNOSIS — E7800 Pure hypercholesterolemia, unspecified: Secondary | ICD-10-CM | POA: Diagnosis present

## 2015-12-30 DIAGNOSIS — M24451 Recurrent dislocation, right hip: Secondary | ICD-10-CM | POA: Diagnosis present

## 2015-12-30 DIAGNOSIS — Z7901 Long term (current) use of anticoagulants: Secondary | ICD-10-CM | POA: Diagnosis not present

## 2015-12-30 DIAGNOSIS — Z833 Family history of diabetes mellitus: Secondary | ICD-10-CM | POA: Diagnosis not present

## 2015-12-30 DIAGNOSIS — S73006A Unspecified dislocation of unspecified hip, initial encounter: Secondary | ICD-10-CM | POA: Diagnosis not present

## 2015-12-30 DIAGNOSIS — R0602 Shortness of breath: Secondary | ICD-10-CM | POA: Diagnosis not present

## 2015-12-30 DIAGNOSIS — I252 Old myocardial infarction: Secondary | ICD-10-CM | POA: Diagnosis not present

## 2015-12-30 DIAGNOSIS — T84021A Dislocation of internal left hip prosthesis, initial encounter: Secondary | ICD-10-CM | POA: Diagnosis not present

## 2015-12-30 DIAGNOSIS — Z951 Presence of aortocoronary bypass graft: Secondary | ICD-10-CM | POA: Diagnosis not present

## 2015-12-30 DIAGNOSIS — M1990 Unspecified osteoarthritis, unspecified site: Secondary | ICD-10-CM | POA: Diagnosis present

## 2015-12-30 DIAGNOSIS — I25810 Atherosclerosis of coronary artery bypass graft(s) without angina pectoris: Secondary | ICD-10-CM | POA: Diagnosis present

## 2015-12-30 DIAGNOSIS — T84020A Dislocation of internal right hip prosthesis, initial encounter: Secondary | ICD-10-CM | POA: Diagnosis not present

## 2015-12-30 DIAGNOSIS — Z950 Presence of cardiac pacemaker: Secondary | ICD-10-CM

## 2015-12-30 DIAGNOSIS — I272 Pulmonary hypertension, unspecified: Secondary | ICD-10-CM | POA: Diagnosis present

## 2015-12-30 DIAGNOSIS — I10 Essential (primary) hypertension: Secondary | ICD-10-CM | POA: Diagnosis not present

## 2015-12-30 DIAGNOSIS — E1122 Type 2 diabetes mellitus with diabetic chronic kidney disease: Secondary | ICD-10-CM | POA: Diagnosis present

## 2015-12-30 DIAGNOSIS — M25551 Pain in right hip: Secondary | ICD-10-CM | POA: Diagnosis not present

## 2015-12-30 DIAGNOSIS — I509 Heart failure, unspecified: Secondary | ICD-10-CM

## 2015-12-30 DIAGNOSIS — F17210 Nicotine dependence, cigarettes, uncomplicated: Secondary | ICD-10-CM | POA: Diagnosis present

## 2015-12-30 DIAGNOSIS — Z841 Family history of disorders of kidney and ureter: Secondary | ICD-10-CM

## 2015-12-30 DIAGNOSIS — J449 Chronic obstructive pulmonary disease, unspecified: Secondary | ICD-10-CM | POA: Diagnosis not present

## 2015-12-30 DIAGNOSIS — S79911A Unspecified injury of right hip, initial encounter: Secondary | ICD-10-CM | POA: Diagnosis not present

## 2015-12-30 DIAGNOSIS — K219 Gastro-esophageal reflux disease without esophagitis: Secondary | ICD-10-CM | POA: Diagnosis present

## 2015-12-30 DIAGNOSIS — Z96641 Presence of right artificial hip joint: Secondary | ICD-10-CM | POA: Diagnosis present

## 2015-12-30 DIAGNOSIS — N189 Chronic kidney disease, unspecified: Secondary | ICD-10-CM | POA: Diagnosis present

## 2015-12-30 HISTORY — DX: Chronic kidney disease, stage 3 (moderate): N18.3

## 2015-12-30 HISTORY — DX: Chronic kidney disease, stage 3 unspecified: N18.30

## 2015-12-30 MED ORDER — DM-GUAIFENESIN ER 30-600 MG PO TB12
1.0000 | ORAL_TABLET | Freq: Two times a day (BID) | ORAL | Status: DC
  Administered 2015-12-31 – 2016-01-03 (×8): 1 via ORAL
  Filled 2015-12-30 (×8): qty 1

## 2015-12-30 MED ORDER — MORPHINE SULFATE (PF) 2 MG/ML IV SOLN: 1.0000 mg | INTRAVENOUS | Status: DC | PRN

## 2015-12-30 MED ORDER — VITAMIN D3 25 MCG (1000 UNIT) PO TABS
1000.0000 [IU] | ORAL_TABLET | Freq: Every day | ORAL | Status: DC
  Administered 2015-12-31 – 2016-01-03 (×4): 1000 [IU] via ORAL
  Filled 2015-12-30 (×4): qty 1

## 2015-12-30 MED ORDER — ZOLPIDEM TARTRATE 5 MG PO TABS: 5.0000 mg | ORAL_TABLET | Freq: Every evening | ORAL | Status: DC | PRN

## 2015-12-30 MED ORDER — ATORVASTATIN CALCIUM 10 MG PO TABS
20.0000 mg | ORAL_TABLET | Freq: Every evening | ORAL | Status: DC
  Administered 2015-12-31 – 2016-01-02 (×3): 20 mg via ORAL
  Filled 2015-12-30 (×3): qty 2

## 2015-12-30 MED ORDER — ALBUTEROL SULFATE (2.5 MG/3ML) 0.083% IN NEBU: 2.5000 mg | INHALATION_SOLUTION | RESPIRATORY_TRACT | Status: DC | PRN

## 2015-12-30 MED ORDER — LORAZEPAM 0.5 MG PO TABS
0.5000 mg | ORAL_TABLET | Freq: Three times a day (TID) | ORAL | Status: DC | PRN
  Administered 2015-12-31 – 2016-01-02 (×2): 0.5 mg via ORAL
  Filled 2015-12-30 (×2): qty 1

## 2015-12-30 MED ORDER — LISINOPRIL 20 MG PO TABS
40.0000 mg | ORAL_TABLET | Freq: Every day | ORAL | Status: DC
  Administered 2015-12-31 – 2016-01-01 (×3): 40 mg via ORAL
  Filled 2015-12-30 (×4): qty 2

## 2015-12-30 MED ORDER — ISOSORBIDE MONONITRATE ER 30 MG PO TB24
120.0000 mg | ORAL_TABLET | Freq: Every day | ORAL | Status: DC
  Administered 2015-12-31 – 2016-01-03 (×4): 120 mg via ORAL
  Filled 2015-12-30 (×4): qty 4

## 2015-12-30 MED ORDER — METOPROLOL SUCCINATE ER 100 MG PO TB24
100.0000 mg | ORAL_TABLET | Freq: Two times a day (BID) | ORAL | Status: DC
  Administered 2015-12-31 – 2016-01-03 (×8): 100 mg via ORAL
  Filled 2015-12-30 (×8): qty 1

## 2015-12-30 MED ORDER — HYDRALAZINE HCL 20 MG/ML IJ SOLN: 5.0000 mg | INTRAMUSCULAR | Status: DC | PRN

## 2015-12-30 MED ORDER — IPRATROPIUM-ALBUTEROL 0.5-2.5 (3) MG/3ML IN SOLN: 3.0000 mL | RESPIRATORY_TRACT | Status: DC

## 2015-12-30 MED ORDER — METHOCARBAMOL 500 MG PO TABS: 500.0000 mg | ORAL_TABLET | Freq: Three times a day (TID) | ORAL | Status: DC | PRN

## 2015-12-30 MED ORDER — DOCUSATE SODIUM 100 MG PO CAPS
100.0000 mg | ORAL_CAPSULE | Freq: Two times a day (BID) | ORAL | Status: DC
  Administered 2015-12-31 – 2016-01-03 (×8): 100 mg via ORAL
  Filled 2015-12-30 (×8): qty 1

## 2015-12-30 MED ORDER — FOSINOPRIL SODIUM 20 MG PO TABS: 40.0000 mg | ORAL_TABLET | Freq: Two times a day (BID) | ORAL | Status: DC

## 2015-12-30 MED ORDER — AMLODIPINE BESYLATE 10 MG PO TABS
10.0000 mg | ORAL_TABLET | Freq: Every day | ORAL | Status: DC
  Administered 2015-12-31 – 2016-01-03 (×5): 10 mg via ORAL
  Filled 2015-12-30 (×5): qty 1

## 2015-12-30 MED ORDER — PSYLLIUM 95 % PO PACK
0.5000 | PACK | Freq: Every day | ORAL | Status: DC
  Administered 2016-01-01 – 2016-01-03 (×3): 0.5 via ORAL
  Filled 2015-12-30 (×3): qty 1

## 2015-12-30 MED ORDER — ONDANSETRON HCL 4 MG/2ML IJ SOLN: 4.0000 mg | Freq: Three times a day (TID) | INTRAMUSCULAR | Status: DC | PRN

## 2015-12-30 MED ORDER — HYDROCODONE-ACETAMINOPHEN 5-325 MG PO TABS
1.0000 | ORAL_TABLET | ORAL | Status: DC | PRN
  Administered 2015-12-31 – 2016-01-03 (×5): 1 via ORAL
  Filled 2015-12-30 (×5): qty 1

## 2015-12-30 MED ORDER — FUROSEMIDE 10 MG/ML IJ SOLN
20.0000 mg | Freq: Two times a day (BID) | INTRAMUSCULAR | Status: DC
  Administered 2015-12-31 (×3): 20 mg via INTRAVENOUS
  Filled 2015-12-30 (×3): qty 2

## 2015-12-30 MED ORDER — PSYLLIUM 95 % PO PACK
1.0000 | PACK | Freq: Every day | ORAL | Status: DC
  Administered 2015-12-31 – 2016-01-01 (×2): 1 via ORAL
  Filled 2015-12-30: qty 1

## 2015-12-30 NOTE — Progress Notes (Signed)
Request for transfer by Dr. Alvan Dame. Pt at [facility] medical center. Needs transfer to [facility].Needs hip repair. Please notify [facility] and Dr. Alvan Dame when pt arrives. Call [phone].  Faye Ramsay, MD  [facility] Pager [phone]  If 7PM-7AM, please contact night-coverage [email or web address] Password [email or web address]
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
## 2015-12-31 ENCOUNTER — Inpatient Hospital Stay (HOSPITAL_COMMUNITY): Payer: Medicare Other

## 2015-12-31 ENCOUNTER — Encounter (HOSPITAL_COMMUNITY): Payer: Self-pay | Admitting: General Practice

## 2015-12-31 DIAGNOSIS — M24451 Recurrent dislocation, right hip: Secondary | ICD-10-CM | POA: Diagnosis not present

## 2015-12-31 DIAGNOSIS — Z01810 Encounter for preprocedural cardiovascular examination: Secondary | ICD-10-CM

## 2015-12-31 DIAGNOSIS — I5033 Acute on chronic diastolic (congestive) heart failure: Secondary | ICD-10-CM

## 2015-12-31 DIAGNOSIS — N189 Chronic kidney disease, unspecified: Secondary | ICD-10-CM | POA: Diagnosis present

## 2015-12-31 DIAGNOSIS — I482 Chronic atrial fibrillation: Secondary | ICD-10-CM

## 2015-12-31 DIAGNOSIS — I509 Heart failure, unspecified: Secondary | ICD-10-CM

## 2015-12-31 LAB — CBC
HEMATOCRIT: 38.6 % (ref 36.0–46.0)
Hemoglobin: 13.1 g/dL (ref 12.0–15.0)
MCH: 30.3 pg (ref 26.0–34.0)
MCHC: 33.9 g/dL (ref 30.0–36.0)
MCV: 89.4 fL (ref 78.0–100.0)
Platelets: 244 10*3/uL (ref 150–400)
RBC: 4.32 MIL/uL (ref 3.87–5.11)
RDW: 13.4 % (ref 11.5–15.5)
WBC: 13.1 10*3/uL — ABNORMAL HIGH (ref 4.0–10.5)

## 2015-12-31 LAB — ECHOCARDIOGRAM COMPLETE
HEIGHTINCHES: 65 in
Weight: 2208 oz

## 2015-12-31 LAB — TROPONIN I
TROPONIN I: 0.04 ng/mL — AB (ref <0.03)
Troponin I: 0.04 ng/mL (ref <0.03)

## 2015-12-31 LAB — BASIC METABOLIC PANEL
Anion gap: 8 (ref 5–15)
BUN: 32 mg/dL — AB (ref 6–20)
CALCIUM: 9 mg/dL (ref 8.9–10.3)
CO2: 27 mmol/L (ref 22–32)
CREATININE: 1.51 mg/dL — AB (ref 0.44–1.00)
Chloride: 105 mmol/L (ref 101–111)
GFR calc non Af Amer: 32 mL/min — ABNORMAL LOW (ref >60)
GFR, EST AFRICAN AMERICAN: 37 mL/min — AB (ref >60)
GLUCOSE: 112 mg/dL — AB (ref 65–99)
Potassium: 3.9 mmol/L (ref 3.5–5.1)
Sodium: 140 mmol/L (ref 135–145)

## 2015-12-31 LAB — BRAIN NATRIURETIC PEPTIDE: B Natriuretic Peptide: 493.1 pg/mL — ABNORMAL HIGH (ref 0.0–100.0)

## 2015-12-31 LAB — PROTIME-INR
INR: 1.05
Prothrombin Time: 13.7 seconds (ref 11.4–15.2)

## 2015-12-31 LAB — LIPID PANEL
CHOL/HDL RATIO: 3.9 ratio
CHOLESTEROL: 130 mg/dL (ref 0–200)
HDL: 33 mg/dL — AB (ref >40)
LDL Cholesterol: 65 mg/dL (ref 0–99)
TRIGLYCERIDES: 161 mg/dL — AB (ref <150)
VLDL: 32 mg/dL (ref 0–40)

## 2015-12-31 LAB — SURGICAL PCR SCREEN
MRSA, PCR: NEGATIVE
STAPHYLOCOCCUS AUREUS: NEGATIVE

## 2015-12-31 LAB — TYPE AND SCREEN
ABO/RH(D): A NEG
Antibody Screen: NEGATIVE

## 2015-12-31 LAB — ABO/RH: ABO/RH(D): A NEG

## 2015-12-31 LAB — APTT: APTT: 34 s (ref 24–36)

## 2015-12-31 MED ORDER — FAMOTIDINE 20 MG PO TABS
10.0000 mg | ORAL_TABLET | Freq: Four times a day (QID) | ORAL | Status: DC | PRN
  Administered 2015-12-31: 10 mg via ORAL
  Filled 2015-12-31: qty 1

## 2015-12-31 MED ORDER — SALINE SPRAY 0.65 % NA SOLN
1.0000 | NASAL | Status: DC | PRN
  Filled 2015-12-31: qty 44

## 2015-12-31 MED ORDER — APIXABAN 2.5 MG PO TABS
2.5000 mg | ORAL_TABLET | Freq: Two times a day (BID) | ORAL | Status: DC
  Administered 2015-12-31: 2.5 mg via ORAL
  Filled 2015-12-31: qty 1

## 2015-12-31 MED ORDER — ENSURE ENLIVE PO LIQD
237.0000 mL | ORAL | Status: DC
  Administered 2015-12-31 – 2016-01-02 (×3): 237 mL via ORAL

## 2015-12-31 MED ORDER — PHENOL 1.4 % MT LIQD
1.0000 | OROMUCOSAL | Status: DC | PRN
  Filled 2015-12-31: qty 177

## 2015-12-31 MED ORDER — GUAIFENESIN-DM 100-10 MG/5ML PO SYRP
5.0000 mL | ORAL_SOLUTION | ORAL | Status: DC | PRN
  Administered 2015-12-31: 5 mL via ORAL
  Filled 2015-12-31: qty 10

## 2015-12-31 MED ORDER — ORAL CARE MOUTH RINSE
15.0000 mL | Freq: Two times a day (BID) | OROMUCOSAL | Status: DC
  Administered 2015-12-31 – 2016-01-02 (×6): 15 mL via OROMUCOSAL

## 2015-12-31 MED ORDER — NICOTINE 21 MG/24HR TD PT24
21.0000 mg | MEDICATED_PATCH | Freq: Every day | TRANSDERMAL | Status: DC
  Administered 2015-12-31 – 2016-01-03 (×4): 21 mg via TRANSDERMAL
  Filled 2015-12-31 (×4): qty 1

## 2015-12-31 MED ORDER — ALUM & MAG HYDROXIDE-SIMETH 200-200-20 MG/5ML PO SUSP: 30.0000 mL | ORAL | Status: DC | PRN

## 2015-12-31 MED ORDER — IPRATROPIUM-ALBUTEROL 0.5-2.5 (3) MG/3ML IN SOLN: 3.0000 mL | RESPIRATORY_TRACT | Status: DC | PRN

## 2015-12-31 MED ORDER — POLYVINYL ALCOHOL 1.4 % OP SOLN
2.0000 [drp] | OPHTHALMIC | Status: DC | PRN
  Filled 2015-12-31: qty 15
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
## 2015-12-31 NOTE — Progress Notes (Signed)
CRITICAL VALUE ALERT  Critical value received:  Troponin 0.04  Date of notification:  12/31/15  Time of notification:  0147  Critical value read back:Yes.    Nurse who received alert:  Reynold Bowen, RN  MD notified (1st page): Baltazar Najjar  Time of first page:  0149  MD notified (2nd page):  Time of second page:  Responding MD: n/a  Time MD responded:  N/a  No new orders given

## 2015-12-31 NOTE — Plan of Care (Signed)
Problem: Pain Managment: Goal: General experience of comfort will improve Outcome: Progressing No pain without movement.  Will continue to monitor.  Problem: Skin Integrity: Goal: Risk for impaired skin integrity will decrease Outcome: Progressing Skin intact.   Problem: Nutrition: Goal: Adequate nutrition will be maintained Outcome: Not Progressing NPO at this time.

## 2015-12-31 NOTE — Progress Notes (Signed)
PT Cancellation/Screen Note  Patient Details Name: Wanda Brooks MRN: [id number] DOB: 1935/08/12   Cancelled Treatment:    Reason Eval/Treat Not Completed: Patient not medically ready. Will sign off. Pt is not medically appropriate for PT at this time. Ortho MD please reorder PT eval (indicating WB status and activity level) once appropriate. Thanks.    Weston Anna, MPT Pager: [phone]

## 2015-12-31 NOTE — Progress Notes (Signed)
Spoke with pt and pt's daughter at bedside. Daughter selected [facility] for HHRN/PT.  Referral given to in house rep.

## 2015-12-31 NOTE — Progress Notes (Signed)
OT Cancellation Note  Patient Details Name: Walta [place] MRN: [id number] DOB: 08-Aug-1935   Cancelled Treatment:    Reason Eval/Treat Not Completed: Medical issues which prohibited therapy.  Awaiting medical management.    Leland Staszewski 12/31/2015, 7:16 AM  Lesle Chris, OTR/L [phone] 12/31/2015

## 2015-12-31 NOTE — Progress Notes (Signed)
Initial Nutrition Assessment  DOCUMENTATION CODES:   Not applicable  INTERVENTION:  Provide Ensure Enlive po once daily, each supplement provides 350 kcal and 20 grams of protein.  Discussed how after surgery patient will have increased protein and calorie needs.   NUTRITION DIAGNOSIS:   Increased nutrient needs related to wound healing, other (see comment) (s/p surgery for hip dislocation) as evidenced by estimated needs.  GOAL:   Patient will meet greater than or equal to 90% of their needs  MONITOR:   PO intake, Supplement acceptance, Weight trends, Labs, I & O's  REASON FOR ASSESSMENT:   Consult Assessment of nutrition requirement/status  ASSESSMENT:   80 y.o. female with medical history significant of hypertension, hyperlipidemia, GERD, anxiety, pacemaker placement, CAD, poor hearing, CKD-III, dCHF, pulmonary hypertension, vavular regurgitation (MR, AR and TR), tobacco abuse, AIF on Eliquis, s/p of bilateral CAE, who presented with right hip pain. She had right hip dislocation for 8 time since July on 2017. She was scheduled for surgery on 01/13/16 but her pain was getting worse and she went to ED.    Patient's daughter present at time of assessment. Patient reports her appetite is "fair" and that she always eats at least 3 meals daily and occasionally snacks. She reports eating balanced meals with adequate protein. She monitors carbohydrate intake (has been told is borderline diabetic) and also follows low sodium diet at home. Endorses nausea and heartburn currently. Denies abdominal pain or difficulty chewing/swallowing. Patient reports her weight is stable between 137-138 lbs (she checks daily due to CHF). She used to be 145 lbs 10-12 years ago.   Medications reviewed and include: Vitamin D 1000 units daily, Colace, Lasix 20 mg Q12hrs, psyllium 0.5 packet daily at breakfast and 1 packed daily at bedtime.   Labs reviewed: BUN, Creatinine, Glucose 112.  Nutrition-Focused  physical exam completed. Findings are no fat depletion, mild muscle depletion, and no edema.   Discussed with RN. Surgery will be scheduled pending clearance from cardiac standpoint.  Diet Order:  Diet regular Room service appropriate? Yes; Fluid consistency: Thin Diet NPO time specified Except for: Sips with Meds  Skin:  Reviewed, no issues  Last BM:  Unknown  Height:   Ht Readings from Last 1 Encounters:  12/31/15 5\' 5"  (1.651 m)    Weight:   Wt Readings from Last 1 Encounters:  12/31/15 138 lb (62.6 kg)    Ideal Body Weight:  56.82 kg  BMI:  Body mass index is 22.96 kg/m.  Estimated Nutritional Needs:   Kcal:  1445-1670 (MSJ x 1.3-1.5)  Protein:  63-81 grams (1-1.3 grams/kg)  Fluid:  1.4-1.6 L/day  EDUCATION NEEDS:   Education needs addressed  Willey Blade, MS, RD, LDN Pager: [phone] After Hours Pager: [phone]

## 2015-12-31 NOTE — Progress Notes (Signed)
  Echocardiogram 2D Echocardiogram has been performed.  Tresa Res 12/31/2015, 1:55 PM

## 2015-12-31 NOTE — Progress Notes (Signed)
PT Cancellation Note  Patient Details Name: Dasiah Hooley MRN: [id number] DOB: 06-30-1935   Cancelled Treatment:    Reason Eval/Treat Not Completed: Patient not medically ready (hass dislocated hip, Ortho consulted. Bedrest. )   Claretha Cooper 12/31/2015, 7:17 AM Tresa Endo PT [phone]

## 2015-12-31 NOTE — Progress Notes (Signed)
Patient ID: [name], female   DOB: 1936-02-25, 80 y.o.   MRN: [id number]    PROGRESS NOTE    Wanda Brooks  LFY:[id number] DOB: Apr 04, 1935 DOA: 12/30/2015  PCP: [name], NP   Brief Narrative:  80 y.o. female with medical history significant of hypertension, hyperlipidemia, GERD, anxiety, pacemaker placement, CAD, poor hearing, CKD-III, dCHF, pulmonary hypertension, vavular regurgitation (MR, AR and TR), tobacco abuse, AIF on Eliquis, s/p of bilateral CAE, who presented with right hip pain. She had right hip dislocation for 8 time since July on 2017. She was scheduled for surgery on 01/13/16 but her pain was getting worse and she went to ED.   Assessment & Plan:   Recurrent dislocation of right hip - pt still in pain this AM but pain meds are helping  - Pain control: morphine prn and percocet - When necessary Zofran for nausea - Robaxin for muscle spasm - cardio consulted for clearance  - appreciate ortho team assistance   Leukocytosis - Likely due to stress-induced demargination. Patient does not have signs of infection. - CBC in AM  Acute on Chronic diastolic CHF (congestive heart failure) ([place]) - 2-D echo on 09/14/15 showed EF is 50-55 percent with grade 3 diastolic dysfunction.  - Patient responded to IV Lasix in ED, currently no shortness of breath - will continue present dose of Lasix 20 mg bid by IV - no need to cycle CE as pt with no chest pain  - appreciate cardiology team assistance  Atrial Fibrillation - CHA2DS2-VASc Score is 7, needs oral anticoagulation.  - Patient is on Eliquis at home. INR is 0.97 on admission. Heart rate is well controlled. - continue metoprolol - Hold eliquis for surgery   Essential hypertension - IV Lasix as above - Hold HCTZ since patient is on IV Lasix - Continue metoprolol, amlodipine, lisinopril, imdur  HLD, mixed - Last LDL was not recorded - Continue home medications: Lipitor  CAD - currently, No CP. - continue,  lipiotor, metoprolol and Imdur  Chronic obstructive pulmonary disease ([place]):  - DuoNeb nebulizer as needed can be used  - Mucinex for cough  CKD (chronic kidney disease), stage III - Baseline creatinine 1.3. - Follow-up renal function by the metal.  Tobacco abuse -Did counseling about importance of quitting smoking -Nicotine patch   DVT prophylaxis: SCD's Code Status: Full  Family Communication: Patient and daughter at bedside  Disposition Plan: To be determined   Consultants:   Ortho   Cardiology   Procedures:   None  Antimicrobials:   None   Subjective: No events overnight, still with right hip pain.   Objective: Vitals:   12/30/15 2230 12/31/15 0026 12/31/15 0643  BP: (!) 187/57 (!) 163/52 (!) 132/55  Pulse: 62 61 64  Resp: 18  18  Temp: 98.6 F (37 C)  98.7 F (37.1 C)  TempSrc: Oral  Oral  SpO2: 95% 95% 95%  Weight:  62.6 kg (138 lb)   Height:  5\' 5"  (1.651 m)     Intake/Output Summary (Last 24 hours) at 12/31/15 0950 Last data filed at 12/31/15 0900  Gross per 24 hour  Intake                0 ml  Output                0 ml  Net                0 ml   Filed Weights   12/31/15  0026  Weight: 62.6 kg (138 lb)    Examination:  General exam: Appears calm and comfortable  Respiratory system: Respiratory effort normal. No wheezing, diminished breath sounds at bases  Cardiovascular system: S1 & S2 heard, RRR. No JVD, 2/6 SEM Gastrointestinal system: Abdomen is nondistended, soft and nontender. No organomegaly or masses felt. Normal bowel sounds heard. Central nervous system: Alert and oriented. No focal neurological deficits. Extremities: Symmetric 5 x 5 power.  Data Reviewed: I have personally reviewed following labs and imaging studies  CBC:  Recent Labs Lab 12/31/15 0643  WBC 13.1*  HGB 13.1  HCT 38.6  MCV 89.4  PLT 801   Basic Metabolic Panel:  Recent Labs Lab 12/31/15 0643  NA 140  K 3.9  CL 105  CO2 27  GLUCOSE 112*   BUN 32*  CREATININE 1.51*  CALCIUM 9.0   Coagulation Profile:  Recent Labs Lab 12/31/15 0643  INR 1.05   Cardiac Enzymes:  Recent Labs Lab 12/31/15 0052 12/31/15 0643  TROPONINI 0.04* 0.04*   Lipid Profile:  Recent Labs  12/31/15 0643  CHOL 130  HDL 33*  LDLCALC 65  TRIG 161*  CHOLHDL 3.9   Recent Results (from the past 240 hour(s))  Surgical PCR screen     Status: None   Collection Time: 12/31/15 12:56 AM  Result Value Ref Range Status   MRSA, PCR NEGATIVE NEGATIVE Final   Staphylococcus aureus NEGATIVE NEGATIVE Final      Radiology Studies: No results found.    Scheduled Meds: . amLODipine  10 mg Oral Q breakfast  . atorvastatin  20 mg Oral QPM  . cholecalciferol  1,000 Units Oral Daily  . dextromethorphan-guaiFENesin  1 tablet Oral BID  . docusate sodium  100 mg Oral BID  . furosemide  20 mg Intravenous Q12H  . isosorbide mononitrate  120 mg Oral Q breakfast  . lisinopril  40 mg Oral Daily  . mouth rinse  15 mL Mouth Rinse BID  . metoprolol succinate  100 mg Oral BID  . nicotine  21 mg Transdermal Daily  . psyllium  0.5 packet Oral Q breakfast  . psyllium  1 packet Oral QHS   Continuous Infusions:    LOS: 1 day    Time spent: 20 minutes    Faye Ramsay, MD [facility] Pager [phone]  If 7PM-7AM, please contact night-coverage [email or web address] Password [email or web address] 12/31/2015, 9:50 AM

## 2015-12-31 NOTE — Progress Notes (Signed)
[facility] made aware that patient is at [facility] now.

## 2016-01-01 DIAGNOSIS — N183 Chronic kidney disease, stage 3 (moderate): Secondary | ICD-10-CM

## 2016-01-01 DIAGNOSIS — I482 Chronic atrial fibrillation: Secondary | ICD-10-CM | POA: Diagnosis not present

## 2016-01-01 DIAGNOSIS — M24451 Recurrent dislocation, right hip: Secondary | ICD-10-CM

## 2016-01-01 DIAGNOSIS — I509 Heart failure, unspecified: Secondary | ICD-10-CM

## 2016-01-01 DIAGNOSIS — J449 Chronic obstructive pulmonary disease, unspecified: Secondary | ICD-10-CM | POA: Diagnosis not present

## 2016-01-01 DIAGNOSIS — I5033 Acute on chronic diastolic (congestive) heart failure: Secondary | ICD-10-CM | POA: Diagnosis not present

## 2016-01-01 DIAGNOSIS — I10 Essential (primary) hypertension: Secondary | ICD-10-CM

## 2016-01-01 LAB — CBC
HCT: 40 % (ref 36.0–46.0)
Hemoglobin: 13.1 g/dL (ref 12.0–15.0)
MCH: 29.8 pg (ref 26.0–34.0)
MCHC: 32.8 g/dL (ref 30.0–36.0)
MCV: 91.1 fL (ref 78.0–100.0)
PLATELETS: 265 10*3/uL (ref 150–400)
RBC: 4.39 MIL/uL (ref 3.87–5.11)
RDW: 13.7 % (ref 11.5–15.5)
WBC: 12.2 10*3/uL — ABNORMAL HIGH (ref 4.0–10.5)

## 2016-01-01 LAB — BASIC METABOLIC PANEL
Anion gap: 10 (ref 5–15)
BUN: 48 mg/dL — AB (ref 6–20)
CHLORIDE: 104 mmol/L (ref 101–111)
CO2: 26 mmol/L (ref 22–32)
CREATININE: 1.72 mg/dL — AB (ref 0.44–1.00)
Calcium: 9.2 mg/dL (ref 8.9–10.3)
GFR calc Af Amer: 32 mL/min — ABNORMAL LOW (ref >60)
GFR, EST NON AFRICAN AMERICAN: 27 mL/min — AB (ref >60)
GLUCOSE: 120 mg/dL — AB (ref 65–99)
Potassium: 3.8 mmol/L (ref 3.5–5.1)
SODIUM: 140 mmol/L (ref 135–145)

## 2016-01-01 LAB — HEMOGLOBIN A1C
Hgb A1c MFr Bld: 5.9 % — ABNORMAL HIGH (ref 4.8–5.6)
MEAN PLASMA GLUCOSE: 123 mg/dL

## 2016-01-01 MED ORDER — APIXABAN 2.5 MG PO TABS
2.5000 mg | ORAL_TABLET | Freq: Two times a day (BID) | ORAL | Status: DC
  Administered 2016-01-01 – 2016-01-03 (×4): 2.5 mg via ORAL
  Filled 2016-01-01 (×4): qty 1

## 2016-01-01 NOTE — Care Management CC44 (Signed)
Condition Code 44 Documentation Completed  Patient Details  Name: Wanda Brooks MRN: [id number] Date of Birth: [birth year]   Condition Code 44 given:  Yes Patient signature on Condition Code 44 notice:  Yes Documentation of 2 MD's agreement:    Code 44 added to claim:       Purcell Mouton, RN 01/01/2016, 4:57 PM

## 2016-01-01 NOTE — Care Management Obs Status (Signed)
[place] NOTIFICATION   Patient Details  Name: Wanda Brooks MRN: [id number] Date of Birth: 1935/12/09   Medicare Observation Status Notification Given:  Yes    Purcell Mouton, RN 01/01/2016, 4:57 PM

## 2016-01-01 NOTE — Progress Notes (Signed)
OT Cancellation Note  Patient Details Name: Wanda Brooks MRN: [id number] DOB: 07-04-1935   Cancelled Treatment:    Reason Eval/Treat Not Completed: Other (comment).  Noted sx is planned. Please reorder OT after surgery. Thank you.  Shardee Dieu 01/01/2016, 3:36 PM  Lesle Chris, OTR/L [phone] 01/01/2016

## 2016-01-01 NOTE — Progress Notes (Signed)
    Subjective:  Denies CP or dyspnea   Objective:  Vitals:   12/31/15 1418 12/31/15 1945 01/01/16 0456 01/01/16 0955  BP: (!) 128/51 (!) 137/53 (!) 133/55 (!) 135/52  Pulse: 61 62 64 61  Resp: 20 18 18 16   Temp: 98.5 F (36.9 C) 98.7 F (37.1 C) 98.7 F (37.1 C) 98.5 F (36.9 C)  TempSrc: Oral Oral Oral Oral  SpO2: 96% 95% 96% 95%  Weight:      Height:        Intake/Output from previous day:  Intake/Output Summary (Last 24 hours) at 01/01/16 1035 Last data filed at 01/01/16 0800  Gross per 24 hour  Intake              480 ml  Output              325 ml  Net              155 ml    Physical Exam: Physical exam: Well-developed frail in no acute distress.  Skin is warm and dry.  HEENT is normal.  Neck is supple.  Chest is clear to auscultation with normal expansion.  Cardiovascular exam is regular rate and rhythm. 2/6 systolic murmur apex Abdominal exam nontender or distended. No masses palpated. Extremities dislocation of right hip. No edema. Diminished distal pulses neuro grossly intact    Lab Results: Basic Metabolic Panel:  Recent Labs  12/31/15 0643 01/01/16 0340  NA 140 140  K 3.9 3.8  CL 105 104  CO2 27 26  GLUCOSE 112* 120*  BUN 32* 48*  CREATININE 1.51* 1.72*  CALCIUM 9.0 9.2   CBC:  Recent Labs  12/31/15 0643 01/01/16 0340  WBC 13.1* 12.2*  HGB 13.1 13.1  HCT 38.6 40.0  MCV 89.4 91.1  PLT 244 265   Cardiac Enzymes:  Recent Labs  12/31/15 0052 12/31/15 0643  TROPONINI 0.04* 0.04*     Assessment/Plan:  1. Preoperative evaluation prior to hip surgery-patient has multiple medical problems including COPD and prior coronary artery bypass graft. She is not having chest pain but functional status is poor because of hip pain. However she did have a nuclear study in July at outside hospital that showed EF 62 with no ischemia. She may proceed with her surgery.  2 chronic diastolic congestive heart failure-the patient has grade 3  diastolic dysfunction on recent echocardiogram. She developed pulmonary edema with 1 L of fluid at outside emergency room which is now improved. We need to be careful with hydration postoperatively. BUN Cr increased today; hold lasix and recheck in AM.  3 prior pacemaker  4 permanent atrial fibrillation-continue beta blocker for rate control. Resume anticoagulation postoperatively.  5 coronary artery disease-continue statin. No aspirin given need for anticoagulation.  6 chronic stage III kidney disease-follow renal function closely.   7 tobacco abuse-patient counseled on discontinuing.  8 moderate mitral regurgitation-she will need follow-up echoes in the future.  9 Hypertension-continue present blood pressure medications.  Kirk Ruths 01/01/2016, 10:35 AM

## 2016-01-01 NOTE — Progress Notes (Signed)
PROGRESS NOTE    Wanda Brooks  [id number]:[id number] DOB: 02-23-1936 DOA: 12/30/2015 PCP: [name], NP    Brief Narrative:  80 y.o.femalewith medical history significant of hypertension, hyperlipidemia, GERD, anxiety, pacemaker placement, CAD, poor hearing, CKD-III, dCHF, pulmonary hypertension, vavularregurgitation (MR, AR and TR), tobacco abuse, AIF on Eliquis, s/p of bilateral CAE, who presented with right hip pain. She had right hip dislocation for 8 time since July on 2017. She was scheduled for surgery on 01/13/16 but her pain was getting worse and she went to ED.  Cardiology consulted for patient as with comorbities needed clearance for surgery.   Assessment & Plan:   Principal Problem:   Recurrent dislocation of right hip Active Problems:   Chronic atrial fibrillation (HCC)   Essential hypertension   Pure hypercholesterolemia   Right hip pain   Coronary artery disease involving coronary bypass graft of native heart without angina pectoris   Chronic obstructive pulmonary disease (HCC)   CKD (chronic kidney disease), stage III   Acute on chronic diastolic CHF (congestive heart failure) (HCC)   Chronic kidney disease   Recurrent dislocation of right hip - patient not in pain this morning  - Pain control: morphine prn and percocet - When necessary Zofran for nausea - Robaxin for muscle spasm - cardio consulted for clearance- patient cleared for orthopaedic surgery - appreciate ortho team assistance - plan for patient to go to OR on Monday, 01/06/16   Leukocytosis - Likely due to stress-induced demargination. Patient does not have signs of infection. - CBC in AM showed slight decrease in WBC - no signs of infection- continue to monitor  Acute on Chronic diastolic CHF (congestive heart failure) ([place]) - 2-D echo on 09/14/15 showed EF is 50-55 percent with grade 3 diastolic dysfunction.  - Patient responded to IV Lasix in ED, currently no shortness of breath - Lasix held  2/2 increase in creatinine today - appreciate cardiology team assistance  Atrial Fibrillation - CHA2DS2-VASc Scoreis 7, needs oral anticoagulation.  - Patient is on Eliquis at home. INR is 0.97 on admission. Heart rate is well controlled. - continue metoprolol - Will continue elliquis until Saturday then hold  Essential hypertension - IV Lasix as above - Hold HCTZ since patient is on IV Lasix - Continue metoprolol, amlodipine, lisinopril, imdur  HLD, mixed -Last LDL was not recorded - Continue home medications: Lipitor  CAD - currently, No CP. - continue, lipiotor, metoprolol and Imdur  Chronic obstructive pulmonary disease ([place]):  - DuoNeb nebulizer as needed can be used  - Mucinex for cough  CKD (chronic kidney disease), stage III - Baseline creatinine 1.3. - repeat BMP in am  Tobacco abuse -Did counseling about importance of quitting smoking -Nicotine patch   DVT prophylaxis: SCD's Code Status: Full  Family Communication: no family at bedside  Disposition Plan: Pending patient's status after surgery      Consultants:   Manson PasseyAlvan Dame  Cardiology  Procedures:   None  Antimicrobials:   none    Subjective: Patient seen with treatment team.  She is doing well.  She voices she would like her daughter to be updated on plan as her daughter is her primary medical liason.  Patient states her hip is currently dislocated and that she cannot move due to pain.  Discussed case with Dr. Alvan Dame- patient hip socket is extremely unstable.  Objective: Vitals:   12/31/15 1945 01/01/16 0456 01/01/16 0955 01/01/16 1512  BP: (!) 137/53 (!) 133/55 (!) 135/52 (!) 139/57  Pulse:  62 64 61 61  Resp: 18 18 16 16   Temp: 98.7 F (37.1 C) 98.7 F (37.1 C) 98.5 F (36.9 C) 98.7 F (37.1 C)  TempSrc: Oral Oral Oral Oral  SpO2: 95% 96% 95% 95%  Weight:      Height:        Intake/Output Summary (Last 24 hours) at 01/01/16 1528 Last data filed at 01/01/16 1200   Gross per 24 hour  Intake              720 ml  Output              325 ml  Net              395 ml   Filed Weights   12/31/15 0026  Weight: 62.6 kg (138 lb)    Examination:  General exam: Appears calm and comfortable  Respiratory system: Clear to auscultation. Respiratory effort normal. Cardiovascular system: S1 & S2 heard, RRR. No JVD, murmurs, rubs, gallops or clicks. No pedal edema. Gastrointestinal system: Abdomen is nondistended, soft and nontender. No organomegaly or masses felt. Normal bowel sounds heard. Central nervous system: Alert and oriented. No focal neurological deficits. Extremities: Symmetric 5 x 5 power but could not assess function of the right hip or power at the hip joint Skin: No rashes, lesions or ulcers Psychiatry: Judgement and insight appear normal. Mood & affect appropriate.     Data Reviewed: I have personally reviewed following labs and imaging studies  CBC:  Recent Labs Lab 12/31/15 0643 01/01/16 0340  WBC 13.1* 12.2*  HGB 13.1 13.1  HCT 38.6 40.0  MCV 89.4 91.1  PLT 244 161   Basic Metabolic Panel:  Recent Labs Lab 12/31/15 0643 01/01/16 0340  NA 140 140  K 3.9 3.8  CL 105 104  CO2 27 26  GLUCOSE 112* 120*  BUN 32* 48*  CREATININE 1.51* 1.72*  CALCIUM 9.0 9.2   GFR: Estimated Creatinine Clearance: 24.3 mL/min (by C-G formula based on SCr of 1.72 mg/dL (H)). Liver Function Tests: No results for input(s): AST, ALT, ALKPHOS, BILITOT, PROT, ALBUMIN in the last 168 hours. No results for input(s): LIPASE, AMYLASE in the last 168 hours. No results for input(s): AMMONIA in the last 168 hours. Coagulation Profile:  Recent Labs Lab 12/31/15 0643  INR 1.05   Cardiac Enzymes:  Recent Labs Lab 12/31/15 0052 12/31/15 0643  TROPONINI 0.04* 0.04*   BNP (last 3 results) No results for input(s): PROBNP in the last 8760 hours. HbA1C:  Recent Labs  12/31/15 0643  HGBA1C 5.9*   CBG: No results for input(s): GLUCAP in the  last 168 hours. Lipid Profile:  Recent Labs  12/31/15 0643  CHOL 130  HDL 33*  LDLCALC 65  TRIG 161*  CHOLHDL 3.9   Thyroid Function Tests: No results for input(s): TSH, T4TOTAL, FREET4, T3FREE, THYROIDAB in the last 72 hours. Anemia Panel: No results for input(s): VITAMINB12, FOLATE, FERRITIN, TIBC, IRON, RETICCTPCT in the last 72 hours. Sepsis Labs: No results for input(s): PROCALCITON, LATICACIDVEN in the last 168 hours.  Recent Results (from the past 240 hour(s))  Surgical PCR screen     Status: None   Collection Time: 12/31/15 12:56 AM  Result Value Ref Range Status   MRSA, PCR NEGATIVE NEGATIVE Final   Staphylococcus aureus NEGATIVE NEGATIVE Final    Comment:        The Xpert SA Assay (FDA approved for NASAL specimens in patients over 54 years of age), is  one component of a comprehensive surveillance program.  Test performance has been validated by [facility] for patients greater than or equal to 82 year old. It is not intended to diagnose infection nor to guide or monitor treatment.          Radiology Studies: No results found.      Scheduled Meds: . amLODipine  10 mg Oral Q breakfast  . apixaban  2.5 mg Oral BID  . atorvastatin  20 mg Oral QPM  . cholecalciferol  1,000 Units Oral Daily  . dextromethorphan-guaiFENesin  1 tablet Oral BID  . docusate sodium  100 mg Oral BID  . feeding supplement (ENSURE ENLIVE)  237 mL Oral Q24H  . isosorbide mononitrate  120 mg Oral Q breakfast  . lisinopril  40 mg Oral Daily  . mouth rinse  15 mL Mouth Rinse BID  . metoprolol succinate  100 mg Oral BID  . nicotine  21 mg Transdermal Daily  . psyllium  0.5 packet Oral Q breakfast  . psyllium  1 packet Oral QHS   Continuous Infusions:    LOS: 2 days    Time spent: 35 minutes    Newman Pies, MD [facility] Pager [phone]  If 7PM-7AM, please contact night-coverage [email or web address] Password [email or web address] 01/01/2016, 3:28 PM

## 2016-01-02 ENCOUNTER — Observation Stay (HOSPITAL_COMMUNITY): Payer: Medicare Other

## 2016-01-02 DIAGNOSIS — N183 Chronic kidney disease, stage 3 (moderate): Secondary | ICD-10-CM | POA: Diagnosis not present

## 2016-01-02 DIAGNOSIS — N189 Chronic kidney disease, unspecified: Secondary | ICD-10-CM

## 2016-01-02 DIAGNOSIS — M24451 Recurrent dislocation, right hip: Secondary | ICD-10-CM | POA: Diagnosis not present

## 2016-01-02 DIAGNOSIS — E7800 Pure hypercholesterolemia, unspecified: Secondary | ICD-10-CM

## 2016-01-02 DIAGNOSIS — I482 Chronic atrial fibrillation: Secondary | ICD-10-CM | POA: Diagnosis not present

## 2016-01-02 DIAGNOSIS — N179 Acute kidney failure, unspecified: Secondary | ICD-10-CM | POA: Diagnosis not present

## 2016-01-02 DIAGNOSIS — I5033 Acute on chronic diastolic (congestive) heart failure: Secondary | ICD-10-CM | POA: Diagnosis not present

## 2016-01-02 LAB — BASIC METABOLIC PANEL
ANION GAP: 8 (ref 5–15)
BUN: 63 mg/dL — ABNORMAL HIGH (ref 6–20)
CHLORIDE: 103 mmol/L (ref 101–111)
CO2: 27 mmol/L (ref 22–32)
Calcium: 8.9 mg/dL (ref 8.9–10.3)
Creatinine, Ser: 2.02 mg/dL — ABNORMAL HIGH (ref 0.44–1.00)
GFR calc non Af Amer: 22 mL/min — ABNORMAL LOW (ref >60)
GFR, EST AFRICAN AMERICAN: 26 mL/min — AB (ref >60)
Glucose, Bld: 146 mg/dL — ABNORMAL HIGH (ref 65–99)
POTASSIUM: 3.5 mmol/L (ref 3.5–5.1)
SODIUM: 138 mmol/L (ref 135–145)

## 2016-01-02 NOTE — Progress Notes (Signed)
PROGRESS NOTE    Wanda Brooks  [id number]:[id number] DOB: 08/13/1935 DOA: 12/30/2015 PCP: [name], NP    Brief Narrative:  80 y.o.femalewith medical history significant of hypertension, hyperlipidemia, GERD, anxiety, pacemaker placement, CAD, poor hearing, CKD-III, dCHF, pulmonary hypertension, vavularregurgitation (MR, AR and TR), tobacco abuse, AIF on Eliquis, s/p of bilateral CAE, who presented with right hip pain. She had right hip dislocation for 8 time since July on 2017. She was scheduled for surgery on 01/13/16 but her pain was getting worse and she went to ED.  Cardiology consulted for patient as with comorbities needed clearance for surgery.   Assessment & Plan:   Principal Problem:   Recurrent dislocation of right hip Active Problems:   Chronic atrial fibrillation (HCC)   Essential hypertension   Pure hypercholesterolemia   Right hip pain   Coronary artery disease involving coronary bypass graft of native heart without angina pectoris   Chronic obstructive pulmonary disease (HCC)   CKD (chronic kidney disease), stage III   Acute on chronic diastolic CHF (congestive heart failure) (HCC)   Chronic kidney disease   Acute CHF (congestive heart failure) (HCC)   Recurrent dislocation of right hip - patient not in pain this morning  - Pain control: morphine prn and percocet - When necessary Zofran for nausea - Robaxin for muscle spasm - cardio consulted for clearance- patient cleared for orthopaedic surgery - appreciate ortho team assistance - plan for patient to go to OR on Monday, 01/06/16   Leukocytosis - Likely due to stress-induced demargination. Patient does not have signs of infection. - CBC in AM showed slight decrease in WBC - no signs of infection- continue to monitor - vital signs WNL at this time  Acute on Chronic diastolic CHF (congestive heart failure) ([place]) - 2-D echo on 09/14/15 showed EF is 50-55 percent with grade 3 diastolic dysfunction.  -  Patient responded to IV Lasix in ED, currently no shortness of breath - Lasix held 2/2 increase in creatinine yesterday and today - appreciate cardiology team assistance   Atrial Fibrillation - CHA2DS2-VASc Scoreis 7, needs oral anticoagulation.  - Patient is on Eliquis at home. INR is 0.97 on admission. Heart rate is well controlled. - continue metoprolol - Will continue elliquis until Saturday then hold  Essential hypertension - Hold HCTZ and Lasix 2/2 increase in creatinine - Continue metoprolol, amlodipine, lisinopril, imdur  HLD, mixed -Last LDL was not recorded - Continue home medications: Lipitor  CAD - currently, No CP. - continue, lipiotor, metoprolol and Imdur  Chronic obstructive pulmonary disease ([place]):  - DuoNeb nebulizer as needed can be used  - Mucinex for cough  Acute on CKD (chronic kidney disease), stage III - Baseline creatinine 1.3 - repeat BMP in am - creatinine >2 today - will order strict I/O's - per record patient only had 256ml output yesterday - will order kidney ultrasound if output continues to be low  Tobacco abuse -Did counseling about importance of quitting smoking -Nicotine patch   DVT prophylaxis: SCD's Code Status: Full  Family Communication: patient's daughter is bedside  Disposition Plan: Pending patient's status after surgery      Consultants:   OrthoAlvan Dame  Cardiology  Procedures:   None  Antimicrobials:   none    Subjective: Patient sitting up in bed watching television.  No events overnight.  Patient's daughter is bedside. Discussed that lasix will be held today secondary to her increase in creatinine.  Patient denies pain, chills, fever.  Understands plan of care;  all questions answered.  Objective: Vitals:   01/01/16 1512 01/01/16 2101 01/02/16 0050 01/02/16 0405  BP: (!) 139/57 (!) 133/59 (!) 142/50 140/61  Pulse: 61 63 (!) 59 60  Resp: 16 17 16 16   Temp: 98.7 F (37.1 C) 98.4 F (36.9 C) 99  F (37.2 C) 98.4 F (36.9 C)  TempSrc: Oral Oral Oral Oral  SpO2: 95% 95% 95% 95%  Weight:      Height:        Intake/Output Summary (Last 24 hours) at 01/02/16 0854 Last data filed at 01/02/16 0708  Gross per 24 hour  Intake              480 ml  Output              350 ml  Net              130 ml   Filed Weights   12/31/15 0026  Weight: 62.6 kg (138 lb)    Examination:  General exam: Appears calm and comfortable  Respiratory system: Clear to auscultation. Respiratory effort normal. Cardiovascular system: S1 & S2 heard, RRR. No JVD, murmurs, rubs, gallops or clicks. No pedal edema. Gastrointestinal system: Abdomen is nondistended, soft and nontender. No organomegaly or masses felt. Normal bowel sounds heard. Central nervous system: Alert and oriented. No focal neurological deficits. Extremities: Symmetric 5 x 5 power but could not assess function of the right hip or power at the hip joint Skin: No rashes, lesions or ulcers Psychiatry: Judgement and insight appear normal. Mood & affect appropriate.     Data Reviewed: I have personally reviewed following labs and imaging studies  CBC:  Recent Labs Lab 12/31/15 0643 01/01/16 0340  WBC 13.1* 12.2*  HGB 13.1 13.1  HCT 38.6 40.0  MCV 89.4 91.1  PLT 244 638   Basic Metabolic Panel:  Recent Labs Lab 12/31/15 0643 01/01/16 0340 01/02/16 0329  NA 140 140 138  K 3.9 3.8 3.5  CL 105 104 103  CO2 27 26 27   GLUCOSE 112* 120* 146*  BUN 32* 48* 63*  CREATININE 1.51* 1.72* 2.02*  CALCIUM 9.0 9.2 8.9   GFR: Estimated Creatinine Clearance: 20.7 mL/min (by C-G formula based on SCr of 2.02 mg/dL (H)). Liver Function Tests: No results for input(s): AST, ALT, ALKPHOS, BILITOT, PROT, ALBUMIN in the last 168 hours. No results for input(s): LIPASE, AMYLASE in the last 168 hours. No results for input(s): AMMONIA in the last 168 hours. Coagulation Profile:  Recent Labs Lab 12/31/15 0643  INR 1.05   Cardiac  Enzymes:  Recent Labs Lab 12/31/15 0052 12/31/15 0643  TROPONINI 0.04* 0.04*   BNP (last 3 results) No results for input(s): PROBNP in the last 8760 hours. HbA1C:  Recent Labs  12/31/15 0643  HGBA1C 5.9*   CBG: No results for input(s): GLUCAP in the last 168 hours. Lipid Profile:  Recent Labs  12/31/15 0643  CHOL 130  HDL 33*  LDLCALC 65  TRIG 161*  CHOLHDL 3.9   Thyroid Function Tests: No results for input(s): TSH, T4TOTAL, FREET4, T3FREE, THYROIDAB in the last 72 hours. Anemia Panel: No results for input(s): VITAMINB12, FOLATE, FERRITIN, TIBC, IRON, RETICCTPCT in the last 72 hours. Sepsis Labs: No results for input(s): PROCALCITON, LATICACIDVEN in the last 168 hours.  Recent Results (from the past 240 hour(s))  Surgical PCR screen     Status: None   Collection Time: 12/31/15 12:56 AM  Result Value Ref Range Status   MRSA, PCR  NEGATIVE NEGATIVE Final   Staphylococcus aureus NEGATIVE NEGATIVE Final    Comment:        The Xpert SA Assay (FDA approved for NASAL specimens in patients over 24 years of age), is one component of a comprehensive surveillance program.  Test performance has been validated by [facility] for patients greater than or equal to 36 year old. It is not intended to diagnose infection nor to guide or monitor treatment.          Radiology Studies: No results found.      Scheduled Meds: . amLODipine  10 mg Oral Q breakfast  . apixaban  2.5 mg Oral BID  . atorvastatin  20 mg Oral QPM  . cholecalciferol  1,000 Units Oral Daily  . dextromethorphan-guaiFENesin  1 tablet Oral BID  . docusate sodium  100 mg Oral BID  . feeding supplement (ENSURE ENLIVE)  237 mL Oral Q24H  . isosorbide mononitrate  120 mg Oral Q breakfast  . lisinopril  40 mg Oral Daily  . mouth rinse  15 mL Mouth Rinse BID  . metoprolol succinate  100 mg Oral BID  . nicotine  21 mg Transdermal Daily  . psyllium  0.5 packet Oral Q breakfast  . psyllium  1  packet Oral QHS   Continuous Infusions:    LOS: 2 days    Time spent: 25 minutes    Newman Pies, MD [facility] Pager [phone]  If 7PM-7AM, please contact night-coverage [email or web address] Password [email or web address] 01/02/2016, 8:54 AM

## 2016-01-02 NOTE — Progress Notes (Signed)
Patient Name: Wanda Brooks Date of Encounter: 01/02/2016  Primary Cardiologist: Lives in [place]. Will establish care with Dr. Fletcher Anon in [place]. Will also need an EP doctor to follow her PM. Will need follow up arranged with our BLT office when she leaves after surgery  Hospital Problem List     Principal Problem:   Recurrent dislocation of right hip Active Problems:   Chronic atrial fibrillation ([place])   Essential hypertension   Pure hypercholesterolemia   Right hip pain   Coronary artery disease involving coronary bypass graft of native heart without angina pectoris   Chronic obstructive pulmonary disease (HCC)   CKD (chronic kidney disease), stage III   Acute on chronic diastolic CHF (congestive heart failure) ([place])   Chronic kidney disease   Acute CHF (congestive heart failure) ([place])     Subjective   Feeling well. Surgery planned for Monday. No chest pain or SOB.  Inpatient Medications    Scheduled Meds: . amLODipine  10 mg Oral Q breakfast  . apixaban  2.5 mg Oral BID  . atorvastatin  20 mg Oral QPM  . cholecalciferol  1,000 Units Oral Daily  . dextromethorphan-guaiFENesin  1 tablet Oral BID  . docusate sodium  100 mg Oral BID  . feeding supplement (ENSURE ENLIVE)  237 mL Oral Q24H  . isosorbide mononitrate  120 mg Oral Q breakfast  . lisinopril  40 mg Oral Daily  . mouth rinse  15 mL Mouth Rinse BID  . metoprolol succinate  100 mg Oral BID  . nicotine  21 mg Transdermal Daily  . psyllium  0.5 packet Oral Q breakfast  . psyllium  1 packet Oral QHS   Continuous Infusions:   PRN Meds: albuterol, alum & mag hydroxide-simeth, famotidine, guaiFENesin-dextromethorphan, hydrALAZINE, HYDROcodone-acetaminophen, ipratropium-albuterol, LORazepam, methocarbamol, morphine injection, ondansetron, phenol, polyvinyl alcohol, sodium chloride, zolpidem   Vital Signs    Vitals:   01/01/16 1512 01/01/16 2101 01/02/16 0050 01/02/16 0405  BP: (!) 139/57 (!) 133/59 (!) 142/50 140/61    Pulse: 61 63 (!) 59 60  Resp: 16 17 16 16   Temp: 98.7 F (37.1 C) 98.4 F (36.9 C) 99 F (37.2 C) 98.4 F (36.9 C)  TempSrc: Oral Oral Oral Oral  SpO2: 95% 95% 95% 95%  Weight:      Height:        Intake/Output Summary (Last 24 hours) at 01/02/16 0705 Last data filed at 01/02/16 0220  Gross per 24 hour  Intake              720 ml  Output              200 ml  Net              520 ml   Filed Weights   12/31/15 0026  Weight: 138 lb (62.6 kg)    Physical Exam   GEN: Well nourished, well developed, in no acute distress. Elderly and frail HEENT: Grossly normal.  Neck: Supple, no JVD, carotid bruits, or masses. Cardiac: RRR, +murmurs @ apex, rubs, or gallops. No clubbing, cyanosis, edema.  Radials/DP/PT 2+ and equal bilaterally.  Respiratory:  Respirations regular and unlabored, clear to auscultation bilaterally. GI: Soft, nontender, nondistended, BS + x 4. MS: no deformity or atrophy. Skin: warm and dry, no rash. Neuro:  Strength and sensation are intact. Psych: AAOx3.  Normal affect.  Labs    CBC  Recent Labs  12/31/15 0643 01/01/16 0340  WBC 13.1* 12.2*  HGB 13.1 13.1  HCT 38.6 40.0  MCV 89.4 91.1  PLT 244 196   Basic Metabolic Panel  Recent Labs  01/01/16 0340 01/02/16 0329  NA 140 138  K 3.8 3.5  CL 104 103  CO2 26 27  GLUCOSE 120* 146*  BUN 48* 63*  CREATININE 1.72* 2.02*  CALCIUM 9.2 8.9   Liver Function Tests No results for input(s): AST, ALT, ALKPHOS, BILITOT, PROT, ALBUMIN in the last 72 hours. No results for input(s): LIPASE, AMYLASE in the last 72 hours. Cardiac Enzymes  Recent Labs  12/31/15 0052 12/31/15 0643  TROPONINI 0.04* 0.04*   BNP Invalid input(s): POCBNP D-Dimer No results for input(s): DDIMER in the last 72 hours. Hemoglobin A1C  Recent Labs  12/31/15 0643  HGBA1C 5.9*   Fasting Lipid Panel  Recent Labs  12/31/15 0643  CHOL 130  HDL 33*  LDLCALC 65  TRIG 161*  CHOLHDL 3.9   Thyroid Function Tests No  results for input(s): TSH, T4TOTAL, T3FREE, THYROIDAB in the last 72 hours.  Invalid input(s): FREET3  Telemetry    V paced, looks like underlying flutter - Personally Reviewed  ECG    Ventricular-paced rhythm HR 63 - Personally Reviewed  Radiology    No results found.  Cardiac Studies   Echocardiogram July 2017 showed normal LV systolic function, grade 3 diastolic dysfunction, mild aortic insufficiency, moderate to severe mitral regurgitation, moderate left atrial enlargement, mild tricuspid regurgitation and moderately elevated pulmonary pressures.  Patient Profile     Wanda Brooks is a 80 y.o. female with a history of CAD s/p CABG (2006), permanent AF on Eliquis, carotid artery disease s/p previous CEA, DMT2, valvular heart disease, pulmonary HTN, previous PPM, and tobacco abuse who presented to [facility] on 12/30/15 with hip dislocation. Cardiology asked for pre-op clearance.  Assessment & Plan    1. Preoperative evaluation prior to hip surgery- patient has multiple medical problems including COPD and prior coronary artery bypass graft. She is not having chest pain but functional status is poor because of hip pain. However, she did have a nuclear study in July at outside hospital that showed EF 62 with no ischemia. She may proceed with her surgery.  2. Chronic diastolic congestive heart failure- the patient has grade 3 diastolic dysfunction on recent echocardiogram. She developed pulmonary edema with 1 L of fluid at outside emergency room which is now improved. We need to be careful with hydration postoperatively. BUN Cr increased to 48/1.72 yesterday and lasix held. BUN/Creat actually worse today 2.02/63. Would continue to hold diuretic  3. Prior pacemaker  4. Permanent atrial fibrillation-continue beta blocker for rate control. Resume anticoagulation postoperatively. Was on Eliquis for CHADSVASC of at least 7 (CHF, HTN, Age, DM, Vasc 60, F sex)  50. Coronary artery disease-  continue statin and BB. No aspirin given need for anticoagulation.  6. Chronic stage III kidney disease- follow renal function closely.   7. Tobacco abuse- patient counseled on discontinuing.  8. Moderate mitral regurgitation- mod-severe MR on 08/2015 echo. She will need follow-up echoes in the future.  9. Hypertension- continue present blood pressure medications.  Signed, Angelena Form, PA-C  01/02/2016, 7:05 AM   As above, patient seen and examined. She denies dyspnea or chest pain. As outlined previously she may proceed with her surgery without further cardiac evaluation. Her BUN and creatinine have increased. Hold diuretics and follow. If renal function continues to deteriorate she may need gentle hydration.  Kirk Ruths, MD

## 2016-01-03 ENCOUNTER — Other Ambulatory Visit (HOSPITAL_COMMUNITY): Payer: Medicare Other

## 2016-01-03 DIAGNOSIS — N183 Chronic kidney disease, stage 3 (moderate): Secondary | ICD-10-CM | POA: Diagnosis not present

## 2016-01-03 DIAGNOSIS — M25551 Pain in right hip: Secondary | ICD-10-CM

## 2016-01-03 DIAGNOSIS — I482 Chronic atrial fibrillation: Secondary | ICD-10-CM | POA: Diagnosis not present

## 2016-01-03 DIAGNOSIS — M24451 Recurrent dislocation, right hip: Secondary | ICD-10-CM | POA: Diagnosis not present

## 2016-01-03 DIAGNOSIS — I5033 Acute on chronic diastolic (congestive) heart failure: Secondary | ICD-10-CM | POA: Diagnosis not present

## 2016-01-03 LAB — URINALYSIS, ROUTINE W REFLEX MICROSCOPIC
Bilirubin Urine: NEGATIVE
Glucose, UA: NEGATIVE mg/dL
Hgb urine dipstick: NEGATIVE
KETONES UR: NEGATIVE mg/dL
LEUKOCYTES UA: NEGATIVE
NITRITE: NEGATIVE
PROTEIN: 100 mg/dL — AB
Specific Gravity, Urine: 1.02 (ref 1.005–1.030)
pH: 6 (ref 5.0–8.0)

## 2016-01-03 LAB — BASIC METABOLIC PANEL
ANION GAP: 8 (ref 5–15)
BUN: 60 mg/dL — ABNORMAL HIGH (ref 6–20)
CALCIUM: 9.1 mg/dL (ref 8.9–10.3)
CO2: 29 mmol/L (ref 22–32)
Chloride: 102 mmol/L (ref 101–111)
Creatinine, Ser: 1.63 mg/dL — ABNORMAL HIGH (ref 0.44–1.00)
GFR, EST AFRICAN AMERICAN: 34 mL/min — AB (ref >60)
GFR, EST NON AFRICAN AMERICAN: 29 mL/min — AB (ref >60)
Glucose, Bld: 125 mg/dL — ABNORMAL HIGH (ref 65–99)
POTASSIUM: 3.7 mmol/L (ref 3.5–5.1)
Sodium: 139 mmol/L (ref 135–145)

## 2016-01-03 LAB — URINE MICROSCOPIC-ADD ON

## 2016-01-03 MED ORDER — APIXABAN 2.5 MG PO TABS: 2.5000 mg | ORAL_TABLET | Freq: Two times a day (BID) | ORAL | Status: DC

## 2016-01-03 MED ORDER — POLYETHYLENE GLYCOL 3350 17 G PO PACK
17.0000 g | PACK | Freq: Every day | ORAL | Status: DC
  Administered 2016-01-03: 17 g via ORAL
  Filled 2016-01-03: qty 1

## 2016-01-03 MED ORDER — GLYCERIN (LAXATIVE) 2.1 G RE SUPP
1.0000 | Freq: Every day | RECTAL | Status: DC | PRN
  Filled 2016-01-03: qty 1

## 2016-01-03 MED ORDER — SENNA 8.6 MG PO TABS
1.0000 | ORAL_TABLET | Freq: Every day | ORAL | Status: DC
  Administered 2016-01-03: 8.6 mg via ORAL
  Filled 2016-01-03: qty 1
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
## 2016-01-03 NOTE — Progress Notes (Signed)
Completed D/C teaching with patient. Answered questions. Pt refused PTAR and will be going via family in stable condition.

## 2016-01-03 NOTE — Progress Notes (Addendum)
Patient Name: Wanda Brooks Date of Encounter: 01/03/2016  Primary Cardiologist: Lives in [place]. Will establish care with Dr. Fletcher Anon in [place]. Will also need an EP doctor to follow her PM. Will need follow up arranged with our BLT office when she leaves after surgery  Hospital Problem List     Principal Problem:   Recurrent dislocation of right hip Active Problems:   Chronic atrial fibrillation ([place])   Essential hypertension   Pure hypercholesterolemia   Right hip pain   Coronary artery disease involving coronary bypass graft of native heart without angina pectoris   Chronic obstructive pulmonary disease (HCC)   CKD (chronic kidney disease), stage III   Acute on chronic diastolic CHF (congestive heart failure) ([place])   Chronic kidney disease   Acute CHF (congestive heart failure) ([place])     Subjective   Feeling well. Surgery planned for Monday. No chest pain or SOB.  Inpatient Medications    Scheduled Meds: . amLODipine  10 mg Oral Q breakfast  . apixaban  2.5 mg Oral BID  . atorvastatin  20 mg Oral QPM  . cholecalciferol  1,000 Units Oral Daily  . dextromethorphan-guaiFENesin  1 tablet Oral BID  . docusate sodium  100 mg Oral BID  . feeding supplement (ENSURE ENLIVE)  237 mL Oral Q24H  . isosorbide mononitrate  120 mg Oral Q breakfast  . mouth rinse  15 mL Mouth Rinse BID  . metoprolol succinate  100 mg Oral BID  . nicotine  21 mg Transdermal Daily  . psyllium  0.5 packet Oral Q breakfast  . psyllium  1 packet Oral QHS   Continuous Infusions:   PRN Meds: albuterol, alum & mag hydroxide-simeth, famotidine, guaiFENesin-dextromethorphan, hydrALAZINE, HYDROcodone-acetaminophen, ipratropium-albuterol, LORazepam, methocarbamol, morphine injection, ondansetron, phenol, polyvinyl alcohol, sodium chloride, zolpidem   Vital Signs    Vitals:   01/02/16 0405 01/02/16 1418 01/02/16 2108 01/03/16 0611  BP: 140/61 (!) 139/50 (!) 145/57 (!) 138/57  Pulse: 60 60 64 62  Resp: 16 16  16 16   Temp: 98.4 F (36.9 C) 98.3 F (36.8 C) 98.2 F (36.8 C) 98 F (36.7 C)  TempSrc: Oral Oral Oral Oral  SpO2: 95% 94% 94% 94%  Weight:      Height:        Intake/Output Summary (Last 24 hours) at 01/03/16 0738 Last data filed at 01/02/16 2115  Gross per 24 hour  Intake              360 ml  Output              700 ml  Net             -340 ml   Filed Weights   12/31/15 0026  Weight: 138 lb (62.6 kg)    Physical Exam   GEN: Well nourished, well developed, in no acute distress. Elderly and frail HEENT: Grossly normal.  Neck: Supple, no JVD, carotid bruits, or masses. Cardiac: RRR, +murmurs @ apex, rubs, or gallops. No clubbing, cyanosis, edema.  Radials/DP/PT 2+ and equal bilaterally.  Respiratory:  Respirations regular and unlabored, clear to auscultation bilaterally. GI: Soft, nontender, nondistended, BS + x 4. MS: no deformity or atrophy. Skin: warm and dry, no rash. Neuro:  Strength and sensation are intact. Psych: AAOx3.  Normal affect.  Labs    CBC  Recent Labs  01/01/16 0340  WBC 12.2*  HGB 13.1  HCT 40.0  MCV 91.1  PLT 621   Basic Metabolic Panel  Recent Labs  01/02/16 0329 01/03/16 0341  NA 138 139  K 3.5 3.7  CL 103 102  CO2 27 29  GLUCOSE 146* 125*  BUN 63* 60*  CREATININE 2.02* 1.63*  CALCIUM 8.9 9.1     Telemetry    V paced, looks like underlying flutter - Personally Reviewed  ECG    Ventricular-paced rhythm HR 63 - Personally Reviewed  Radiology    US Renal  Result Date: 01/02/2016 CLINICAL DATA:  Low urine output. Chronic kidney disease stage III. History of hypertension. EXAM: RENAL / URINARY TRACT ULTRASOUND COMPLETE COMPARISON:  None. FINDINGS: Right Kidney: Length: 10.3 cm. Normal renal echogenicity. Probable intrarenal calculus measures 5 mm in the mid to lower pole region. No solid or cystic mass identified. No hydronephrosis. Left Kidney: Length: 10.4 cm. Echogenicity is normal. No hydronephrosis. Upper pole cyst  is 3.4 x 3.3 x 3.6 cm. Lower pole cyst is 2.0 x 2.0 x 2.0 cm. No solid mass. Bladder: Appears normal for degree of bladder distention. IMPRESSION: 1. No hydronephrosis. 2. Nonobstructing intrarenal calculus on the right measuring 5 mm. 3. Left renal cyst. Electronically Signed   By: Nolon Nations M.D.   On: 01/02/2016 15:24    Cardiac Studies   Echocardiogram July 2017 showed normal LV systolic function, grade 3 diastolic dysfunction, mild aortic insufficiency, moderate to severe mitral regurgitation, moderate left atrial enlargement, mild tricuspid regurgitation and moderately elevated pulmonary pressures.  Patient Profile     Wanda Brooks is a 80 y.o. female with a history of CAD s/p CABG (2006), permanent AF on Eliquis, carotid artery disease s/p previous CEA, DMT2, valvular heart disease, pulmonary HTN, previous PPM, and tobacco abuse who presented to [facility] on 12/30/15 with hip dislocation. Cardiology asked for pre-op clearance.  Assessment & Plan    1. Preoperative evaluation prior to hip surgery- patient has multiple medical problems including COPD and prior coronary artery bypass graft. She is not having chest pain but functional status is poor because of hip pain. However, she did have a nuclear study in July at outside hospital that showed EF 62 with no ischemia. She may proceed with her surgery which is [planned for monday  2. Chronic diastolic congestive heart failure- the patient has grade 3 diastolic dysfunction on recent echocardiogram. She developed pulmonary edema with 1 L of fluid at outside emergency room which is now improved. We need to be careful with hydration postoperatively. BUN Cr increased to 2.02/63 and diuretics held. Now BUN/Cr improved to 60/1.63. Looks like baseline creat ~1.3 ( 11/2015). Once she gets closer to her baseline, we can resume her home HCTZ 25mg  daily.   3. Prior pacemaker  4. Permanent atrial fibrillation-continue beta blocker for rate control. Resume  anticoagulation postoperatively. Was on Eliquis for CHADSVASC of at least 7 (CHF, HTN, Age, DM, Vasc 59, F sex)  69. Coronary artery disease- continue statin and BB. No aspirin given need for anticoagulation.  6. Chronic stage III kidney disease- follow renal function closely.   7. Tobacco abuse- patient counseled on discontinuing.  8. Moderate mitral regurgitation- mod-severe MR on 08/2015 echo. She will need follow-up echoes in the future.  9. Hypertension- continue present blood pressure medications.  Signed, Angelena Form, PA-C  01/03/2016, 7:38 AM  As above, patient seen and examined. She denies chest pain or dyspnea. BUN and creatinine mildly improved. Continue to hold diuretics. Okay to proceed with surgery on Monday from a cardiac standpoint. Would hold apixaban 3 days prior to surgery. Aaron Edelman  Stanford [place], MD

## 2016-01-03 NOTE — Discharge Summary (Signed)
Physician Discharge Summary  Wanda Brooks WCB:[id number] DOB: [birth year] DOA: 12/30/2015  PCP: [name], NP  Admit date: 12/30/2015 Discharge date: 01/03/2016  Admitted From: Home Disposition:  Home   Recommendations for Outpatient Follow-up:  1. Complete bedrest 2. Follow up with PCP in 1 week 3. Follow up with BLT office after surgery 4. Call [facility] Monday to get pre- op instructions 5. Hold Eliquis starting Saturday PM 6. NPO Monday after midnight  Home Health: Yes  Equipment/Devices: None   Discharge Condition: Guarded  CODE STATUS:Full code Diet recommendation: Heart Healthy / Carb Modified  Brief/Interim Summary: 80 y.o.femalewith medical history significant of hypertension, hyperlipidemia, GERD, anxiety, pacemaker placement, CAD, poor hearing, CKD-III, dCHF, pulmonary hypertension, vavularregurgitation (MR, AR and TR), tobacco abuse, AIF on Eliquis, s/p of bilateral CAE, who presentedwith right hip pain. She had right hip dislocation for 8 time since July on 2017. She was scheduled for surgery on 01/13/16 but her pain was getting worse and she went to ED.  Cardiology consulted for patient as with comorbities needed clearance for surgery. Cardiology cleared patient for her surgery.  Surgery scheduled for 01/07/16.  Patient unable to ambulate due to her dislocation.  At time of discharge SNF were not available unless patient's family paid out of pocket and thus patient was to discharge to patient's daughter's home.    Discharge Diagnoses:  Principal Problem:   Recurrent dislocation of right hip Active Problems:   Chronic atrial fibrillation (HCC)   Essential hypertension   Pure hypercholesterolemia   Right hip pain   Coronary artery disease involving coronary bypass graft of native heart without angina pectoris   Chronic obstructive pulmonary disease (HCC)   CKD (chronic kidney disease), stage III   Acute on chronic diastolic CHF (congestive heart failure)  (HCC)   Chronic kidney disease   Acute CHF (congestive heart failure) (HCC)    Discharge Instructions  Discharge Instructions    Bed rest    Complete by:  As directed    Call MD for:  difficulty breathing, headache or visual disturbances    Complete by:  As directed    Call MD for:  extreme fatigue    Complete by:  As directed    Call MD for:  persistant dizziness or light-headedness    Complete by:  As directed    Call MD for:  persistant nausea and vomiting    Complete by:  As directed    Call MD for:  severe uncontrolled pain    Complete by:  As directed    Call MD for:  temperature >100.4    Complete by:  As directed    Diet - low sodium heart healthy    Complete by:  As directed    Diet Carb Modified    Complete by:  As directed    Discharge instructions    Complete by:  As directed    Complete bedrest       Medication List    STOP taking these medications   hydrochlorothiazide 25 MG tablet Commonly known as:  HYDRODIURIL     TAKE these medications   amLODipine 10 MG tablet Commonly known as:  NORVASC Take 10 mg by mouth daily with breakfast.   apixaban 2.5 MG Tabs tablet Commonly known as:  ELIQUIS Take 1 tablet (2.5 mg total) by mouth 2 (two) times daily.   atorvastatin 20 MG tablet Commonly known as:  LIPITOR Take 20 mg by mouth every evening.   cholecalciferol 1000 units  tablet Commonly known as:  VITAMIN D Take 1,000 Units by mouth daily.   docusate sodium 100 MG capsule Commonly known as:  COLACE Take 100 mg by mouth 2 (two) times daily.   fosinopril 40 MG tablet Commonly known as:  MONOPRIL Take 40 mg by mouth 2 (two) times daily.   HYDROcodone-acetaminophen 5-325 MG tablet Commonly known as:  NORCO/VICODIN Take 1 tablet by mouth 3 (three) times daily as needed for moderate pain.   isosorbide mononitrate 120 MG 24 hr tablet Commonly known as:  IMDUR Take 120 mg by mouth daily with breakfast.   LORazepam 0.5 MG tablet Commonly known  as:  ATIVAN Take 0.5 mg by mouth 3 (three) times daily as needed for anxiety.   METAMUCIL 0.52 g capsule Generic drug:  psyllium Take 0.52 g by mouth daily with breakfast.   psyllium 58.6 % packet Commonly known as:  METAMUCIL Take 1 packet by mouth at bedtime.   metoprolol succinate 100 MG 24 hr tablet Commonly known as:  TOPROL-XL Take 100 mg by mouth 2 (two) times daily.   vitamin C 500 MG tablet Commonly known as:  ASCORBIC ACID Take 1 tablet (500 mg total) by mouth daily.       Allergies  Allergen Reactions  . Aspirin Other (See Comments)    Stomach burning  Can only take coated  . Daypro [Oxaprozin] Other (See Comments)    Dizziness Affects driving  . Multaq [Dronedarone] Diarrhea    Consultations:  Orthopaedics  Cardiology  Procedures/Studies: US Renal  Result Date: 01/02/2016 CLINICAL DATA:  Low urine output. Chronic kidney disease stage III. History of hypertension. EXAM: RENAL / URINARY TRACT ULTRASOUND COMPLETE COMPARISON:  None. FINDINGS: Right Kidney: Length: 10.3 cm. Normal renal echogenicity. Probable intrarenal calculus measures 5 mm in the mid to lower pole region. No solid or cystic mass identified. No hydronephrosis. Left Kidney: Length: 10.4 cm. Echogenicity is normal. No hydronephrosis. Upper pole cyst is 3.4 x 3.3 x 3.6 cm. Lower pole cyst is 2.0 x 2.0 x 2.0 cm. No solid mass. Bladder: Appears normal for degree of bladder distention. IMPRESSION: 1. No hydronephrosis. 2. Nonobstructing intrarenal calculus on the right measuring 5 mm. 3. Left renal cyst. Electronically Signed   By: Nolon Nations M.D.   On: 01/02/2016 15:24   Dg Pelvis Portable  Result Date: 12/20/2015 CLINICAL DATA:  80 year old female status post reduction of the right hip dislocation. EXAM: PORTABLE PELVIS 1-2 VIEWS COMPARISON:  Earlier radiograph 12/20/2015 FINDINGS: Interval reduction of the previously seen dislocated right hip arthroplasty now in anatomic alignment. No acute  fracture. 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 Osteopenia with degenerative changes of the lower lumbar spine. IMPRESSION: Interval reduction of the previously seen dislocated right hip arthroplasty, now in anatomic alignment. No acute fracture. Electronically Signed   By: Anner Crete M.D.   On: 12/20/2015 02:27   Dg Hip Unilat  With Pelvis 2-3 Views Right  Result Date: 12/20/2015 CLINICAL DATA:  80 year old female with right hip dislocation. EXAM: DG HIP (WITH OR WITHOUT PELVIS) 2-3V RIGHT COMPARISON:  Radiograph dated 11/28/2015 FINDINGS: There is a total right hip arthroplasty. There is superior dislocation of the femoral component with the femoral head positioned superior to the acetabular cup. No acute fracture identified. There are chronic changes of the medial wall of the right acetabulum with protrusio acetabula and fragmentation similar to prior study. The bones are osteopenic. The soft tissues appear unremarkable. IMPRESSION: Superiorly dislocated right hip arthroplasty.  No acute fracture. Electronically Signed   By: Anner Crete M.D.   On: 12/20/2015 02:04       Subjective: Patient seen and evaluated.  She is laying in bed and in no pain.  Patient's daughter is a Marine scientist at [employer] and voices she would be able to care for patient at home.  Will offer community resources so patient can get assistance her daughter's house.  Will plan to discharge with instructions on patient's elliquis as well as instructions for patient to contact [facility] for surgical plans on Monday.   Discharge Exam: Vitals:   01/03/16 0933 01/03/16 1354  BP: (!) 148/50 (!) 153/55  Pulse:    Resp: 16 16  Temp: 98.6 F (37 C) 98 F (36.7 C)   Vitals:   01/02/16 2108 01/03/16 0611 01/03/16 0933 01/03/16 1354  BP: (!) 145/57 (!) 138/57 (!) 148/50 (!) 153/55  Pulse: 64 62    Resp: 16 16 16 16   Temp: 98.2 F (36.8 C) 98 F (36.7 C) 98.6 F (37 C)  98 F (36.7 C)  TempSrc: Oral Oral Oral Oral  SpO2: 94% 94% 95% 97%  Weight:      Height:        General: Pt is alert, awake, not in acute distress Cardiovascular: RRR, S1/S2 +, no rubs, no gallops Respiratory: CTA bilaterally, no wheezing, no rhonchi Abdominal: Soft, NT, ND, bowel sounds + Extremities: no edema, no cyanosis    The results of significant diagnostics from this hospitalization (including imaging, microbiology, ancillary and laboratory) are listed below for reference.     Microbiology: Recent Results (from the past 240 hour(s))  Surgical PCR screen     Status: None   Collection Time: 12/31/15 12:56 AM  Result Value Ref Range Status   MRSA, PCR NEGATIVE NEGATIVE Final   Staphylococcus aureus NEGATIVE NEGATIVE Final    Comment:        The Xpert SA Assay (FDA approved for NASAL specimens in patients over 52 years of age), is one component of a comprehensive surveillance program.  Test performance has been validated by [facility] for patients greater than or equal to 11 year old. It is not intended to diagnose infection nor to guide or monitor treatment.      Labs: BNP (last 3 results)  Recent Labs  12/31/15 0643  BNP 270.3*   Basic Metabolic Panel:  Recent Labs Lab 12/31/15 0643 01/01/16 0340 01/02/16 0329 01/03/16 0341  NA 140 140 138 139  K 3.9 3.8 3.5 3.7  CL 105 104 103 102  CO2 27 26 27 29   GLUCOSE 112* 120* 146* 125*  BUN 32* 48* 63* 60*  CREATININE 1.51* 1.72* 2.02* 1.63*  CALCIUM 9.0 9.2 8.9 9.1   Liver Function Tests: No results for input(s): AST, ALT, ALKPHOS, BILITOT, PROT, ALBUMIN in the last 168 hours. No results for input(s): LIPASE, AMYLASE in the last 168 hours. No results for input(s): AMMONIA in the last 168 hours. CBC:  Recent Labs Lab 12/31/15 0643 01/01/16 0340  WBC 13.1* 12.2*  HGB 13.1 13.1  HCT 38.6 40.0  MCV 89.4 91.1  PLT 244 265   Cardiac Enzymes:  Recent Labs Lab 12/31/15 0052 12/31/15 0643   TROPONINI 0.04* 0.04*   BNP: Invalid input(s): POCBNP CBG: No results for input(s): GLUCAP in the last 168 hours. D-Dimer No results for input(s): DDIMER in the last 72 hours. Hgb A1c No results for input(s): HGBA1C in the last 72 hours. Lipid  Profile No results for input(s): CHOL, HDL, LDLCALC, TRIG, CHOLHDL, LDLDIRECT in the last 72 hours. Thyroid function studies No results for input(s): TSH, T4TOTAL, T3FREE, THYROIDAB in the last 72 hours.  Invalid input(s): FREET3 Anemia work up No results for input(s): VITAMINB12, FOLATE, FERRITIN, TIBC, IRON, RETICCTPCT in the last 72 hours. Urinalysis    Component Value Date/Time   COLORURINE YELLOW 01/03/2016 1028   APPEARANCEUR CLEAR 01/03/2016 1028   LABSPEC 1.020 01/03/2016 1028   PHURINE 6.0 01/03/2016 1028   GLUCOSEU NEGATIVE 01/03/2016 1028   HGBUR NEGATIVE 01/03/2016 1028   BILIRUBINUR NEGATIVE 01/03/2016 1028   KETONESUR NEGATIVE 01/03/2016 1028   PROTEINUR 100 (A) 01/03/2016 1028   NITRITE NEGATIVE 01/03/2016 1028   LEUKOCYTESUR NEGATIVE 01/03/2016 1028   Sepsis Labs Invalid input(s): PROCALCITONIN,  WBC,  LACTICIDVEN Microbiology Recent Results (from the past 240 hour(s))  Surgical PCR screen     Status: None   Collection Time: 12/31/15 12:56 AM  Result Value Ref Range Status   MRSA, PCR NEGATIVE NEGATIVE Final   Staphylococcus aureus NEGATIVE NEGATIVE Final    Comment:        The Xpert SA Assay (FDA approved for NASAL specimens in patients over 74 years of age), is one component of a comprehensive surveillance program.  Test performance has been validated by [facility] for patients greater than or equal to 50 year old. It is not intended to diagnose infection nor to guide or monitor treatment.      Time coordinating discharge: More than 30 minutes  SIGNED:   Newman Pies, MD  [facility] 01/03/2016, 3:50 PM Pager [phone] If 7PM-7AM, please contact  night-coverage [email or web address] Password [email or web address]

## 2016-01-03 NOTE — Progress Notes (Signed)
Spoke with daughter [name] concerning pt's discharge plan. [name] do not want [facility] at present time and selected to take pt home related to Medicare guidelines.

## 2016-01-04 LAB — URINE CULTURE

## 2016-01-06 ENCOUNTER — Encounter (HOSPITAL_COMMUNITY): Payer: Self-pay | Admitting: *Deleted

## 2016-01-06 ENCOUNTER — Telehealth: Payer: Self-pay | Admitting: *Deleted

## 2016-01-06 NOTE — Telephone Encounter (Signed)
Transition Care Management Follow-up Telephone Call  Per Discharge Summary: PCP: [name], NP  Admit date: 12/30/2015 Discharge date: 01/03/2016  Admitted From: Home Disposition:  Home   Recommendations for Outpatient Follow-up:  1. Complete bedrest 2. Follow up with PCP in 1 week 3. Follow up with BLT office after surgery 4. Call [facility] Monday to get pre- op instructions 5. Hold Eliquis starting Saturday PM 6. NPO Monday after midnight  Home Health: Yes  Equipment/Devices: None   Discharge Condition: Guarded  CODE STATUS:Full code Diet recommendation: Heart Healthy / Carb Modified  --   How have you been since you were released from the hospital? "Not good, they sent her home with a dislocated hip."   Do you understand why you were in the hospital? yes   Do you understand the discharge instructions? yes   Where were you discharged to? Home    Items Reviewed:  Medications reviewed: no, unable to access medications. In use by another provider at time of call.  Allergies reviewed: yes  Dietary changes reviewed: yes, heart healthy/carb mod  Referrals reviewed: no, none made   Functional Questionnaire:   Activities of Daily Living (ADLs):   She states they are independent in the following: none, pt is on total bedrest d/t dislocated hip and pending surgery States they require assistance with the following: ambulation, bathing and hygiene, feeding, continence, grooming, toileting and dressing   Any transportation issues/concerns?: no   Any patient concerns? Yes, pt's creatinine was elevated during admission, but trending down on discharge. Pt's daughter would like to know if she should hold fosinopril d/t pt's renal function.  Lab Results  Component Value Date   CREATININE 1.63 (H) 01/03/2016      Confirmed importance and date/time of follow-up visits scheduled no, pt is having hip surgery tomorrow (01/07/16). Pt's daughter is unsure  how long she will be in the hospital and feels that she may go to rehab on discharge, so did not schedule hospital follow-up appt at this time.  Confirmed with patient if condition begins to worsen call PCP or go to the ER.  Patient was given the office number and encouraged to call back with question or concerns.  : yes
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
## 2016-01-07 ENCOUNTER — Inpatient Hospital Stay (HOSPITAL_COMMUNITY)
Admission: RE | Admit: 2016-01-07 | Discharge: 2016-01-08 | DRG: 467 | Disposition: A | Discharge status: Home Health | Payer: Medicare Other | Source: Ambulatory Visit | Attending: Orthopedic Surgery | Admitting: Orthopedic Surgery

## 2016-01-07 ENCOUNTER — Inpatient Hospital Stay (HOSPITAL_COMMUNITY): Payer: Medicare Other | Admitting: Anesthesiology

## 2016-01-07 ENCOUNTER — Inpatient Hospital Stay (HOSPITAL_COMMUNITY): Payer: Medicare Other

## 2016-01-07 ENCOUNTER — Encounter (HOSPITAL_COMMUNITY): Payer: Self-pay | Admitting: *Deleted

## 2016-01-07 ENCOUNTER — Encounter (HOSPITAL_COMMUNITY)
Admission: RE | Disposition: A | Discharge status: Home Health | Payer: Self-pay | Source: Ambulatory Visit | Attending: Orthopedic Surgery

## 2016-01-07 DIAGNOSIS — Z471 Aftercare following joint replacement surgery: Secondary | ICD-10-CM | POA: Diagnosis not present

## 2016-01-07 DIAGNOSIS — Z01811 Encounter for preprocedural respiratory examination: Secondary | ICD-10-CM

## 2016-01-07 DIAGNOSIS — Z79899 Other long term (current) drug therapy: Secondary | ICD-10-CM | POA: Diagnosis not present

## 2016-01-07 DIAGNOSIS — Z833 Family history of diabetes mellitus: Secondary | ICD-10-CM | POA: Diagnosis not present

## 2016-01-07 DIAGNOSIS — Z841 Family history of disorders of kidney and ureter: Secondary | ICD-10-CM

## 2016-01-07 DIAGNOSIS — H9190 Unspecified hearing loss, unspecified ear: Secondary | ICD-10-CM | POA: Diagnosis present

## 2016-01-07 DIAGNOSIS — I5032 Chronic diastolic (congestive) heart failure: Secondary | ICD-10-CM | POA: Diagnosis present

## 2016-01-07 DIAGNOSIS — T84030A Mechanical loosening of internal right hip prosthetic joint, initial encounter: Secondary | ICD-10-CM | POA: Diagnosis present

## 2016-01-07 DIAGNOSIS — I482 Chronic atrial fibrillation: Secondary | ICD-10-CM | POA: Diagnosis present

## 2016-01-07 DIAGNOSIS — K219 Gastro-esophageal reflux disease without esophagitis: Secondary | ICD-10-CM | POA: Diagnosis present

## 2016-01-07 DIAGNOSIS — J449 Chronic obstructive pulmonary disease, unspecified: Secondary | ICD-10-CM | POA: Diagnosis present

## 2016-01-07 DIAGNOSIS — Z96649 Presence of unspecified artificial hip joint: Secondary | ICD-10-CM

## 2016-01-07 DIAGNOSIS — E7800 Pure hypercholesterolemia, unspecified: Secondary | ICD-10-CM | POA: Diagnosis present

## 2016-01-07 DIAGNOSIS — Z823 Family history of stroke: Secondary | ICD-10-CM | POA: Diagnosis not present

## 2016-01-07 DIAGNOSIS — T84020A Dislocation of internal right hip prosthesis, initial encounter: Secondary | ICD-10-CM | POA: Diagnosis not present

## 2016-01-07 DIAGNOSIS — I130 Hypertensive heart and chronic kidney disease with heart failure and stage 1 through stage 4 chronic kidney disease, or unspecified chronic kidney disease: Secondary | ICD-10-CM | POA: Diagnosis present

## 2016-01-07 DIAGNOSIS — Z8249 Family history of ischemic heart disease and other diseases of the circulatory system: Secondary | ICD-10-CM

## 2016-01-07 DIAGNOSIS — Z950 Presence of cardiac pacemaker: Secondary | ICD-10-CM | POA: Diagnosis not present

## 2016-01-07 DIAGNOSIS — I2510 Atherosclerotic heart disease of native coronary artery without angina pectoris: Secondary | ICD-10-CM | POA: Diagnosis present

## 2016-01-07 DIAGNOSIS — N183 Chronic kidney disease, stage 3 (moderate): Secondary | ICD-10-CM | POA: Diagnosis present

## 2016-01-07 DIAGNOSIS — Z951 Presence of aortocoronary bypass graft: Secondary | ICD-10-CM

## 2016-01-07 DIAGNOSIS — F411 Generalized anxiety disorder: Secondary | ICD-10-CM | POA: Diagnosis present

## 2016-01-07 DIAGNOSIS — T84090A Other mechanical complication of internal right hip prosthesis, initial encounter: Secondary | ICD-10-CM | POA: Diagnosis not present

## 2016-01-07 DIAGNOSIS — Z800 Family history of malignant neoplasm of digestive organs: Secondary | ICD-10-CM | POA: Diagnosis not present

## 2016-01-07 DIAGNOSIS — F17210 Nicotine dependence, cigarettes, uncomplicated: Secondary | ICD-10-CM | POA: Diagnosis present

## 2016-01-07 DIAGNOSIS — E1122 Type 2 diabetes mellitus with diabetic chronic kidney disease: Secondary | ICD-10-CM | POA: Diagnosis present

## 2016-01-07 DIAGNOSIS — Y792 Prosthetic and other implants, materials and accessory orthopedic devices associated with adverse incidents: Secondary | ICD-10-CM | POA: Diagnosis present

## 2016-01-07 DIAGNOSIS — I252 Old myocardial infarction: Secondary | ICD-10-CM | POA: Diagnosis not present

## 2016-01-07 DIAGNOSIS — I509 Heart failure, unspecified: Secondary | ICD-10-CM | POA: Diagnosis not present

## 2016-01-07 DIAGNOSIS — I517 Cardiomegaly: Secondary | ICD-10-CM | POA: Diagnosis not present

## 2016-01-07 DIAGNOSIS — T8451XA Infection and inflammatory reaction due to internal right hip prosthesis, initial encounter: Secondary | ICD-10-CM | POA: Diagnosis not present

## 2016-01-07 DIAGNOSIS — Z96641 Presence of right artificial hip joint: Secondary | ICD-10-CM | POA: Diagnosis not present

## 2016-01-07 HISTORY — DX: Depression, unspecified: F32.A

## 2016-01-07 HISTORY — PX: TOTAL HIP REVISION: SHX763

## 2016-01-07 HISTORY — DX: Atherosclerotic heart disease of native coronary artery without angina pectoris: I25.10

## 2016-01-07 HISTORY — DX: Personal history of other diseases of the respiratory system: Z87.09

## 2016-01-07 HISTORY — DX: Personal history of other diseases of the digestive system: Z87.19

## 2016-01-07 HISTORY — DX: Major depressive disorder, single episode, unspecified: F32.9

## 2016-01-07 LAB — BASIC METABOLIC PANEL
ANION GAP: 6 (ref 5–15)
BUN: 41 mg/dL — ABNORMAL HIGH (ref 6–20)
CALCIUM: 9.6 mg/dL (ref 8.9–10.3)
CO2: 25 mmol/L (ref 22–32)
CREATININE: 1.44 mg/dL — AB (ref 0.44–1.00)
Chloride: 108 mmol/L (ref 101–111)
GFR, EST AFRICAN AMERICAN: 39 mL/min — AB (ref >60)
GFR, EST NON AFRICAN AMERICAN: 34 mL/min — AB (ref >60)
Glucose, Bld: 128 mg/dL — ABNORMAL HIGH (ref 65–99)
Potassium: 5.3 mmol/L — ABNORMAL HIGH (ref 3.5–5.1)
SODIUM: 139 mmol/L (ref 135–145)

## 2016-01-07 LAB — GLUCOSE, CAPILLARY: GLUCOSE-CAPILLARY: 131 mg/dL — AB (ref 65–99)

## 2016-01-07 LAB — TYPE AND SCREEN
ABO/RH(D): A NEG
ANTIBODY SCREEN: NEGATIVE

## 2016-01-07 SURGERY — TOTAL HIP REVISION
Anesthesia: Spinal | Site: Hip | Laterality: Right

## 2016-01-07 MED ORDER — HYDROMORPHONE HCL 1 MG/ML IJ SOLN: 0.5000 mg | INTRAMUSCULAR | Status: DC | PRN

## 2016-01-07 MED ORDER — SODIUM CHLORIDE 0.9 % IR SOLN
Status: DC | PRN
  Administered 2016-01-07: 1000 mL

## 2016-01-07 MED ORDER — FERROUS SULFATE 325 (65 FE) MG PO TABS
325.0000 mg | ORAL_TABLET | Freq: Three times a day (TID) | ORAL | Status: DC
  Administered 2016-01-08 (×2): 325 mg via ORAL
  Filled 2016-01-07 (×2): qty 1

## 2016-01-07 MED ORDER — ONDANSETRON HCL 4 MG/2ML IJ SOLN
INTRAMUSCULAR | Status: DC | PRN
  Administered 2016-01-07: 4 mg via INTRAVENOUS

## 2016-01-07 MED ORDER — CEFAZOLIN SODIUM-DEXTROSE 2-4 GM/100ML-% IV SOLN
2.0000 g | Freq: Four times a day (QID) | INTRAVENOUS | Status: AC
  Administered 2016-01-07 (×2): 2 g via INTRAVENOUS
  Filled 2016-01-07 (×2): qty 100

## 2016-01-07 MED ORDER — BISACODYL 10 MG RE SUPP: 10.0000 mg | Freq: Every day | RECTAL | Status: DC | PRN

## 2016-01-07 MED ORDER — CEFAZOLIN SODIUM-DEXTROSE 2-4 GM/100ML-% IV SOLN
INTRAVENOUS | Status: AC
  Filled 2016-01-07: qty 100

## 2016-01-07 MED ORDER — ALUM & MAG HYDROXIDE-SIMETH 200-200-20 MG/5ML PO SUSP: 30.0000 mL | ORAL | Status: DC | PRN

## 2016-01-07 MED ORDER — CHLORHEXIDINE GLUCONATE 4 % EX LIQD: 60.0000 mL | Freq: Once | CUTANEOUS | Status: DC

## 2016-01-07 MED ORDER — EPHEDRINE 5 MG/ML INJ
INTRAVENOUS | Status: AC
  Filled 2016-01-07: qty 20

## 2016-01-07 MED ORDER — SODIUM CHLORIDE 0.9 % IV SOLN
INTRAVENOUS | Status: DC
  Administered 2016-01-07 (×2): via INTRAVENOUS

## 2016-01-07 MED ORDER — 0.9 % SODIUM CHLORIDE (POUR BTL) OPTIME
TOPICAL | Status: DC | PRN
  Administered 2016-01-07: 1000 mL

## 2016-01-07 MED ORDER — HYDROCODONE-ACETAMINOPHEN 7.5-325 MG PO TABS
1.0000 | ORAL_TABLET | ORAL | Status: DC
  Administered 2016-01-07 (×2): 2 via ORAL
  Administered 2016-01-07: 1 via ORAL
  Administered 2016-01-08 (×3): 2 via ORAL
  Filled 2016-01-07 (×4): qty 2
  Filled 2016-01-07: qty 1
  Filled 2016-01-07: qty 2

## 2016-01-07 MED ORDER — PROMETHAZINE HCL 25 MG/ML IJ SOLN: 6.2500 mg | INTRAMUSCULAR | Status: DC | PRN

## 2016-01-07 MED ORDER — HYDROMORPHONE HCL 1 MG/ML IJ SOLN
0.2500 mg | INTRAMUSCULAR | Status: DC | PRN
  Administered 2016-01-07: 0.5 mg via INTRAVENOUS

## 2016-01-07 MED ORDER — METOCLOPRAMIDE HCL 5 MG PO TABS
5.0000 mg | ORAL_TABLET | Freq: Three times a day (TID) | ORAL | Status: DC | PRN
Start: 2016-01-07 — End: 2016-01-08

## 2016-01-07 MED ORDER — METOCLOPRAMIDE HCL 5 MG/ML IJ SOLN: 5.0000 mg | Freq: Three times a day (TID) | INTRAMUSCULAR | Status: DC | PRN

## 2016-01-07 MED ORDER — PROPOFOL 10 MG/ML IV BOLUS
INTRAVENOUS | Status: AC
  Filled 2016-01-07: qty 20

## 2016-01-07 MED ORDER — MENTHOL 3 MG MT LOZG: 1.0000 | LOZENGE | OROMUCOSAL | Status: DC | PRN

## 2016-01-07 MED ORDER — DEXAMETHASONE SODIUM PHOSPHATE 10 MG/ML IJ SOLN
10.0000 mg | Freq: Once | INTRAMUSCULAR | Status: AC
  Administered 2016-01-07: 10 mg via INTRAVENOUS

## 2016-01-07 MED ORDER — HYDROMORPHONE HCL 1 MG/ML IJ SOLN
INTRAMUSCULAR | Status: AC
  Filled 2016-01-07: qty 1

## 2016-01-07 MED ORDER — PROPOFOL 500 MG/50ML IV EMUL
INTRAVENOUS | Status: DC | PRN
  Administered 2016-01-07: 25 ug/kg/min via INTRAVENOUS

## 2016-01-07 MED ORDER — METOPROLOL SUCCINATE ER 50 MG PO TB24
100.0000 mg | ORAL_TABLET | Freq: Two times a day (BID) | ORAL | Status: DC
  Administered 2016-01-07 – 2016-01-08 (×2): 100 mg via ORAL
  Filled 2016-01-07 (×2): qty 2

## 2016-01-07 MED ORDER — ATORVASTATIN CALCIUM 20 MG PO TABS
20.0000 mg | ORAL_TABLET | Freq: Every evening | ORAL | Status: DC
  Administered 2016-01-07: 20 mg via ORAL
  Filled 2016-01-07: qty 1

## 2016-01-07 MED ORDER — ISOSORBIDE MONONITRATE ER 30 MG PO TB24
120.0000 mg | ORAL_TABLET | Freq: Every day | ORAL | Status: DC
  Administered 2016-01-08: 120 mg via ORAL
  Filled 2016-01-07: qty 4

## 2016-01-07 MED ORDER — AMLODIPINE BESYLATE 10 MG PO TABS
10.0000 mg | ORAL_TABLET | Freq: Every day | ORAL | Status: DC
  Administered 2016-01-08: 10 mg via ORAL
  Filled 2016-01-07: qty 1

## 2016-01-07 MED ORDER — CEFAZOLIN SODIUM-DEXTROSE 2-4 GM/100ML-% IV SOLN
2.0000 g | INTRAVENOUS | Status: AC
  Administered 2016-01-07: 2 g via INTRAVENOUS
  Filled 2016-01-07: qty 100

## 2016-01-07 MED ORDER — DOCUSATE SODIUM 100 MG PO CAPS
100.0000 mg | ORAL_CAPSULE | Freq: Two times a day (BID) | ORAL | Status: DC
  Administered 2016-01-07 – 2016-01-08 (×2): 100 mg via ORAL
  Filled 2016-01-07 (×2): qty 1

## 2016-01-07 MED ORDER — PROPOFOL 10 MG/ML IV BOLUS
INTRAVENOUS | Status: DC | PRN
  Administered 2016-01-07 (×3): 20 mg via INTRAVENOUS

## 2016-01-07 MED ORDER — PHENYLEPHRINE 40 MCG/ML (10ML) SYRINGE FOR IV PUSH (FOR BLOOD PRESSURE SUPPORT)
PREFILLED_SYRINGE | INTRAVENOUS | Status: DC | PRN
  Administered 2016-01-07: 80 ug via INTRAVENOUS
  Administered 2016-01-07: 120 ug via INTRAVENOUS

## 2016-01-07 MED ORDER — FENTANYL CITRATE (PF) 100 MCG/2ML IJ SOLN
INTRAMUSCULAR | Status: AC
  Filled 2016-01-07: qty 2

## 2016-01-07 MED ORDER — DIPHENHYDRAMINE HCL 25 MG PO CAPS: 25.0000 mg | ORAL_CAPSULE | Freq: Four times a day (QID) | ORAL | Status: DC | PRN

## 2016-01-07 MED ORDER — LORAZEPAM 0.5 MG PO TABS: 0.5000 mg | ORAL_TABLET | Freq: Three times a day (TID) | ORAL | Status: DC | PRN

## 2016-01-07 MED ORDER — POLYETHYLENE GLYCOL 3350 17 G PO PACK
17.0000 g | PACK | Freq: Two times a day (BID) | ORAL | Status: DC
  Administered 2016-01-07 – 2016-01-08 (×2): 17 g via ORAL
  Filled 2016-01-07 (×2): qty 1

## 2016-01-07 MED ORDER — BUPIVACAINE IN DEXTROSE 0.75-8.25 % IT SOLN
INTRATHECAL | Status: DC | PRN
  Administered 2016-01-07: 1.8 mL via INTRATHECAL

## 2016-01-07 MED ORDER — ALBUMIN HUMAN 5 % IV SOLN
INTRAVENOUS | Status: AC
  Filled 2016-01-07: qty 250

## 2016-01-07 MED ORDER — MIDAZOLAM HCL 2 MG/2ML IJ SOLN
INTRAMUSCULAR | Status: DC | PRN
  Administered 2016-01-07: 0.5 mg via INTRAVENOUS

## 2016-01-07 MED ORDER — ALBUMIN HUMAN 5 % IV SOLN
INTRAVENOUS | Status: DC | PRN
  Administered 2016-01-07: 14:00:00 via INTRAVENOUS

## 2016-01-07 MED ORDER — MAGNESIUM CITRATE PO SOLN: 1.0000 | Freq: Once | ORAL | Status: DC | PRN

## 2016-01-07 MED ORDER — APIXABAN 2.5 MG PO TABS
2.5000 mg | ORAL_TABLET | Freq: Two times a day (BID) | ORAL | Status: DC
  Administered 2016-01-08: 2.5 mg via ORAL
  Filled 2016-01-07: qty 1

## 2016-01-07 MED ORDER — ONDANSETRON HCL 4 MG PO TABS: 4.0000 mg | ORAL_TABLET | Freq: Four times a day (QID) | ORAL | Status: DC | PRN

## 2016-01-07 MED ORDER — PHENOL 1.4 % MT LIQD
1.0000 | OROMUCOSAL | Status: DC | PRN
  Filled 2016-01-07: qty 177

## 2016-01-07 MED ORDER — FENTANYL CITRATE (PF) 100 MCG/2ML IJ SOLN
INTRAMUSCULAR | Status: DC | PRN
  Administered 2016-01-07 (×2): 25 ug via INTRAVENOUS

## 2016-01-07 MED ORDER — LORATADINE 10 MG PO TABS
10.0000 mg | ORAL_TABLET | Freq: Every day | ORAL | Status: DC
  Administered 2016-01-08: 10 mg via ORAL
  Filled 2016-01-07: qty 1

## 2016-01-07 MED ORDER — LACTATED RINGERS IV SOLN
INTRAVENOUS | Status: DC
  Administered 2016-01-07: 1000 mL via INTRAVENOUS

## 2016-01-07 MED ORDER — MIDAZOLAM HCL 2 MG/2ML IJ SOLN
INTRAMUSCULAR | Status: AC
  Filled 2016-01-07: qty 2

## 2016-01-07 MED ORDER — TRANEXAMIC ACID 1000 MG/10ML IV SOLN
1000.0000 mg | INTRAVENOUS | Status: AC
  Administered 2016-01-07: 1000 mg via INTRAVENOUS
  Filled 2016-01-07: qty 1100

## 2016-01-07 MED ORDER — EPHEDRINE SULFATE-NACL 50-0.9 MG/10ML-% IV SOSY
PREFILLED_SYRINGE | INTRAVENOUS | Status: DC | PRN
Start: 2016-01-07 — End: 2016-01-07
  Administered 2016-01-07: 10 mg via INTRAVENOUS

## 2016-01-07 MED ORDER — SODIUM CHLORIDE 0.9 % IV SOLN
INTRAVENOUS | Status: DC
  Administered 2016-01-07: 1000 mL via INTRAVENOUS
  Administered 2016-01-07: 12:00:00 via INTRAVENOUS

## 2016-01-07 MED ORDER — METHOCARBAMOL 1000 MG/10ML IJ SOLN
500.0000 mg | Freq: Four times a day (QID) | INTRAVENOUS | Status: DC | PRN
  Administered 2016-01-07: 500 mg via INTRAVENOUS
  Filled 2016-01-07: qty 550
  Filled 2016-01-07: qty 5

## 2016-01-07 MED ORDER — DEXAMETHASONE SODIUM PHOSPHATE 10 MG/ML IJ SOLN
10.0000 mg | Freq: Once | INTRAMUSCULAR | Status: AC
  Administered 2016-01-08: 10 mg via INTRAVENOUS
  Filled 2016-01-07: qty 1

## 2016-01-07 MED ORDER — METHOCARBAMOL 500 MG PO TABS: 500.0000 mg | ORAL_TABLET | Freq: Four times a day (QID) | ORAL | Status: DC | PRN

## 2016-01-07 MED ORDER — ONDANSETRON HCL 4 MG/2ML IJ SOLN: 4.0000 mg | Freq: Four times a day (QID) | INTRAMUSCULAR | Status: DC | PRN

## 2016-01-07 MED ORDER — PHENYLEPHRINE HCL 10 MG/ML IJ SOLN
INTRAVENOUS | Status: DC | PRN
  Administered 2016-01-07: 50 ug/min via INTRAVENOUS

## 2016-01-07 MED ORDER — LIDOCAINE 2% (20 MG/ML) 5 ML SYRINGE
INTRAMUSCULAR | Status: DC | PRN
  Administered 2016-01-07: 20 mg via INTRAVENOUS

## 2016-01-07 SURGICAL SUPPLY — 72 items
ACE LINER DUAL G7 54 HIP (Liner) ×1 IMPLANT
ACE LINER DUAL G7 54MM HIP (Liner) ×1 IMPLANT
ACE SHELL G7 68 (Shell) ×2 IMPLANT
BAG DECANTER FOR FLEXI CONT (MISCELLANEOUS) ×3 IMPLANT
BAG ZIPLOCK 12X15 (MISCELLANEOUS) ×3 IMPLANT
BEARING DUAL 28 54 HIP (Miscellaneous) ×2 IMPLANT
BIT DRILL RINGLOC QUICK CONN (BIT) ×1 IMPLANT
BLADE SAW SGTL 18X1.27X75 (BLADE) ×2 IMPLANT
BLADE SAW SGTL 18X1.27X75MM (BLADE) ×1
BRUSH FEMORAL CANAL (MISCELLANEOUS) IMPLANT
DERMABOND ADVANCED (GAUZE/BANDAGES/DRESSINGS) ×2
DERMABOND ADVANCED .7 DNX12 (GAUZE/BANDAGES/DRESSINGS) ×1 IMPLANT
DRAPE INCISE IOBAN 85X60 (DRAPES) ×3 IMPLANT
DRAPE ORTHO SPLIT 77X108 STRL (DRAPES) ×4
DRAPE POUCH INSTRU U-SHP 10X18 (DRAPES) ×3 IMPLANT
DRAPE SURG 17X11 SM STRL (DRAPES) ×3 IMPLANT
DRAPE SURG ORHT 6 SPLT 77X108 (DRAPES) ×2 IMPLANT
DRAPE U-SHAPE 47X51 STRL (DRAPES) ×3 IMPLANT
DRESSING AQUACEL AG SP 3.5X10 (GAUZE/BANDAGES/DRESSINGS) IMPLANT
DRILL BIT RINGLOC QUICK CONN (BIT) ×2
DRSG AQUACEL AG ADV 3.5X10 (GAUZE/BANDAGES/DRESSINGS) IMPLANT
DRSG AQUACEL AG ADV 3.5X14 (GAUZE/BANDAGES/DRESSINGS) ×3 IMPLANT
DRSG AQUACEL AG SP 3.5X10 (GAUZE/BANDAGES/DRESSINGS)
DURAPREP 26ML APPLICATOR (WOUND CARE) ×3 IMPLANT
ELECT BLADE TIP CTD 4 INCH (ELECTRODE) ×3 IMPLANT
ELECT REM PT RETURN 9FT ADLT (ELECTROSURGICAL) ×3
ELECTRODE REM PT RTRN 9FT ADLT (ELECTROSURGICAL) ×1 IMPLANT
FACESHIELD WRAPAROUND (MASK) ×12 IMPLANT
GAUZE SPONGE 2X2 8PLY STRL LF (GAUZE/BANDAGES/DRESSINGS) ×1 IMPLANT
GLOVE BIOGEL M 7.0 STRL (GLOVE) IMPLANT
GLOVE BIOGEL PI IND STRL 7.5 (GLOVE) ×1 IMPLANT
GLOVE BIOGEL PI IND STRL 8.5 (GLOVE) IMPLANT
GLOVE BIOGEL PI INDICATOR 7.5 (GLOVE) ×2
GLOVE BIOGEL PI INDICATOR 8.5 (GLOVE)
GLOVE ECLIPSE 8.0 STRL XLNG CF (GLOVE) ×6 IMPLANT
GOWN STRL REUS W/TWL LRG LVL3 (GOWN DISPOSABLE) ×3 IMPLANT
GOWN STRL REUS W/TWL XL LVL3 (GOWN DISPOSABLE) ×3 IMPLANT
GRAFT IC CHAMBER MED (Bone Implant) ×3 IMPLANT
HANDPIECE INTERPULSE COAX TIP (DISPOSABLE)
HEAD MOD COCR 28MM +6 NO SKIRT (Orthopedic Implant) ×3 IMPLANT
HIP ACETABULAR SCREW 6.5X20MM (Hips) ×3 IMPLANT
LINER ACE DUAL G7 54 HIP (Liner) ×1 IMPLANT
MANIFOLD NEPTUNE II (INSTRUMENTS) ×3 IMPLANT
MARKER SKIN DUAL TIP RULER LAB (MISCELLANEOUS) ×3 IMPLANT
NDL SAFETY ECLIPSE 18X1.5 (NEEDLE) ×1 IMPLANT
NEEDLE HYPO 18GX1.5 SHARP (NEEDLE) ×2
NS IRRIG 1000ML POUR BTL (IV SOLUTION) ×6 IMPLANT
PADDING CAST COTTON 6X4 STRL (CAST SUPPLIES) IMPLANT
POSITIONER SURGICAL ARM (MISCELLANEOUS) ×3 IMPLANT
PRESSURIZER FEMORAL UNIV (MISCELLANEOUS) IMPLANT
SCREW ACETABULAR G7 6.5X30 (Screw) ×2 IMPLANT
SCREW ACETABULAR G7 6.5X35MM (Hips) ×3 IMPLANT
SCREW ACETABULAR HIP 6.5X20MM (Hips) ×1 IMPLANT
SET HNDPC FAN SPRY TIP SCT (DISPOSABLE) IMPLANT
SHELL ACETAB G7 68 (Shell) ×1 IMPLANT
SPONGE GAUZE 2X2 STER 10/PKG (GAUZE/BANDAGES/DRESSINGS) ×2
SPONGE LAP 18X18 X RAY DECT (DISPOSABLE) IMPLANT
SPONGE LAP 4X18 X RAY DECT (DISPOSABLE) IMPLANT
STAPLER VISISTAT 35W (STAPLE) IMPLANT
SUCTION FRAZIER HANDLE 10FR (MISCELLANEOUS) ×2
SUCTION TUBE FRAZIER 10FR DISP (MISCELLANEOUS) ×1 IMPLANT
SUT MNCRL AB 3-0 PS2 18 (SUTURE) ×3 IMPLANT
SUT VIC AB 1 CT1 36 (SUTURE) ×3 IMPLANT
SUT VIC AB 2-0 CT1 27 (SUTURE) ×4
SUT VIC AB 2-0 CT1 TAPERPNT 27 (SUTURE) ×2 IMPLANT
SUT VLOC 180 0 24IN GS25 (SUTURE) ×3 IMPLANT
TOWEL OR 17X26 10 PK STRL BLUE (TOWEL DISPOSABLE) ×6 IMPLANT
TOWER CARTRIDGE SMART MIX (DISPOSABLE) IMPLANT
TRAY FOLEY W/METER SILVER 16FR (SET/KITS/TRAYS/PACK) IMPLANT
TUBE KAMVAC SUCTION (TUBING) IMPLANT
WATER STERILE IRR 1500ML POUR (IV SOLUTION) ×3 IMPLANT
YANKAUER SUCT BULB TIP 10FT TU (MISCELLANEOUS) ×3 IMPLANT

## 2016-01-07 NOTE — Anesthesia Procedure Notes (Signed)
Spinal  Patient location during procedure: OR End time: 01/07/2016 12:25 PM Staffing Resident/CRNA: Cynda Familia Performed: resident/CRNA  Preanesthetic Checklist Completed: patient identified, site marked, surgical consent, pre-op evaluation, timeout performed, IV checked, risks and benefits discussed and monitors and equipment checked Spinal Block Patient position: sitting Prep: ChloraPrep and site prepped and draped Patient monitoring: heart rate, continuous pulse ox and blood pressure Approach: midline Location: L3-4 Injection technique: single-shot Needle Needle type: Sprotte  Needle gauge: 24 G Assessment Sensory level: T4 Additional Notes Expiration date of tray noted and within date.   Patient tolerated procedure well. Spinal as per Domingo Madeira physically present to supervise procedure

## 2016-01-07 NOTE — Interval H&P Note (Signed)
History and Physical Interval Note:  01/07/2016 12:59 PM  Wanda Brooks  has presented today for surgery, with the diagnosis of FAILED RIGHT TOTAL HIP  The various methods of treatment have been discussed with the patient and family. After consideration of risks, benefits and other options for treatment, the patient has consented to  Procedure(s): RIGHT TOTAL HIP REVISION (Right) as a surgical intervention .  The patient's history has been reviewed, patient examined, no change in status, stable for surgery.  I have reviewed the patient's chart and labs.  Questions were answered to the patient's satisfaction.     [name]

## 2016-01-07 NOTE — Anesthesia Postprocedure Evaluation (Signed)
Anesthesia Post Note  Patient: Wanda Brooks  Procedure(s) Performed: Procedure(s) (LRB): RIGHT TOTAL HIP REVISION (Right)  Patient location during evaluation: PACU Anesthesia Type: Spinal Level of consciousness: oriented and awake and alert Pain management: pain level controlled Vital Signs Assessment: post-procedure vital signs reviewed and stable Respiratory status: spontaneous breathing, respiratory function stable and patient connected to nasal cannula oxygen Cardiovascular status: blood pressure returned to baseline and stable Postop Assessment: no headache and no backache Anesthetic complications: no    Last Vitals:  Vitals:   01/07/16 1427 01/07/16 1430  BP: (!) 152/58 (!) 151/60  Pulse: 65 62  Resp: 19 16  Temp: 36.5 C     Last Pain:  Vitals:   01/07/16 1445  TempSrc:   PainSc: 5                  Tenee Wish S

## 2016-01-07 NOTE — Anesthesia Preprocedure Evaluation (Signed)
Anesthesia Evaluation  Patient identified by MRN, date of birth, ID band Patient awake    Reviewed: Allergy & Precautions, NPO status , Patient's Chart, lab work & pertinent test results  Airway Mallampati: II  TM Distance: >3 FB Neck ROM: Full    Dental no notable dental hx.    Pulmonary neg pulmonary ROS, Current Smoker,    Pulmonary exam normal breath sounds clear to auscultation       Cardiovascular hypertension, + CAD, + Past MI, + Cardiac Stents and + CABG  Normal cardiovascular exam+ dysrhythmias + pacemaker  Rhythm:Regular Rate:Normal  EF 55%   Neuro/Psych negative neurological ROS  negative psych ROS   GI/Hepatic negative GI ROS, Neg liver ROS,   Endo/Other  diabetes  Renal/GU Renal InsufficiencyRenal disease  negative genitourinary   Musculoskeletal negative musculoskeletal ROS (+)   Abdominal   Peds negative pediatric ROS (+)  Hematology negative hematology ROS (+)   Anesthesia Other Findings   Reproductive/Obstetrics negative OB ROS                             Anesthesia Physical Anesthesia Plan  ASA: III  Anesthesia Plan: Spinal   Post-op Pain Management:    Induction: Intravenous  Airway Management Planned: Simple Face Mask  Additional Equipment:   Intra-op Plan:   Post-operative Plan:   Informed Consent: I have reviewed the patients History and Physical, chart, labs and discussed the procedure including the risks, benefits and alternatives for the proposed anesthesia with the patient or authorized representative who has indicated his/her understanding and acceptance.   Dental advisory given  Plan Discussed with: CRNA and Surgeon  Anesthesia Plan Comments:         Anesthesia Quick Evaluation

## 2016-01-07 NOTE — Transfer of Care (Signed)
Immediate Anesthesia Transfer of Care Note  Patient: Wanda Brooks  Procedure(s) Performed: Procedure(s): RIGHT TOTAL HIP REVISION (Right)  Patient Location: PACU  Anesthesia Type:Spinal  Level of Consciousness: awake and alert   Airway & Oxygen Therapy: Patient Spontanous Breathing and Patient connected to face mask oxygen  Post-op Assessment: Report given to RN and Post -op Vital signs reviewed and stable  Post vital signs: Reviewed  Last Vitals:  Vitals:   01/07/16 1427 01/07/16 1430  BP: (!) 152/58 (!) 151/60  Pulse: 65 62  Resp: (P) 19 16  Temp: (P) 36.5 C     Last Pain:  Vitals:   01/07/16 1132  TempSrc:   PainSc: 0-No pain      Patients Stated Pain Goal: 4 (97/58/83 2549)  Complications: No apparent anesthesia complications

## 2016-01-07 NOTE — Progress Notes (Signed)
Called [facility] in [place] will take patient to Radiology and then to OR with daughter to go with her.

## 2016-01-07 NOTE — Brief Op Note (Signed)
01/07/2016  2:09 PM  PATIENT:  Wanda Brooks  80 y.o. female  PRE-OPERATIVE DIAGNOSIS:  FAILED RIGHT TOTAL HIP  POST-OPERATIVE DIAGNOSIS:  FAILED RIGHT TOTAL HIP  PROCEDURE:  Procedure(s): RIGHT TOTAL HIP REVISION (Right)  SURGEON:  Surgeon(s) and Role:    * Paralee Cancel, MD - Primary  PHYSICIAN ASSISTANT: Molli Barrows, PA-C  ANESTHESIA:   spinal  EBL:  Total I/O In: -  Out: 400 [Urine:200; Blood:200]  BLOOD ADMINISTERED:none  DRAINS: none   LOCAL MEDICATIONS USED:  NONE  SPECIMEN:  No Specimen  DISPOSITION OF SPECIMEN:  N/A  COUNTS:  YES  TOURNIQUET:  * No tourniquets in log *  DICTATION: .Other Dictation: Dictation Number [id number]  PLAN OF CARE: Admit to inpatient   PATIENT DISPOSITION:  PACU - hemodynamically stable.   Delay start of Pharmacological VTE agent (>24hrs) due to surgical blood loss or risk of bleeding: no

## 2016-01-08 LAB — CBC
HCT: 30 % — ABNORMAL LOW (ref 36.0–46.0)
Hemoglobin: 10.2 g/dL — ABNORMAL LOW (ref 12.0–15.0)
MCH: 30.7 pg (ref 26.0–34.0)
MCHC: 34 g/dL (ref 30.0–36.0)
MCV: 90.4 fL (ref 78.0–100.0)
PLATELETS: 227 10*3/uL (ref 150–400)
RBC: 3.32 MIL/uL — AB (ref 3.87–5.11)
RDW: 13.7 % (ref 11.5–15.5)
WBC: 13.3 10*3/uL — ABNORMAL HIGH (ref 4.0–10.5)

## 2016-01-08 LAB — BASIC METABOLIC PANEL
Anion gap: 6 (ref 5–15)
BUN: 43 mg/dL — AB (ref 6–20)
CO2: 23 mmol/L (ref 22–32)
Calcium: 8.5 mg/dL — ABNORMAL LOW (ref 8.9–10.3)
Chloride: 107 mmol/L (ref 101–111)
Creatinine, Ser: 1.51 mg/dL — ABNORMAL HIGH (ref 0.44–1.00)
GFR calc Af Amer: 37 mL/min — ABNORMAL LOW (ref >60)
GFR, EST NON AFRICAN AMERICAN: 32 mL/min — AB (ref >60)
GLUCOSE: 150 mg/dL — AB (ref 65–99)
POTASSIUM: 4.9 mmol/L (ref 3.5–5.1)
Sodium: 136 mmol/L (ref 135–145)

## 2016-01-08 MED ORDER — METHOCARBAMOL 500 MG PO TABS: 500.0000 mg | ORAL_TABLET | Freq: Four times a day (QID) | ORAL | 0 refills | Status: DC | PRN

## 2016-01-08 MED ORDER — HYDROCODONE-ACETAMINOPHEN 7.5-325 MG PO TABS
1.0000 | ORAL_TABLET | ORAL | 0 refills | Status: DC | PRN
Start: 2016-01-08 — End: 2016-02-07

## 2016-01-08 MED ORDER — FERROUS SULFATE 325 (65 FE) MG PO TABS: 325.0000 mg | ORAL_TABLET | Freq: Three times a day (TID) | ORAL | 3 refills | Status: DC

## 2016-01-08 MED ORDER — POLYETHYLENE GLYCOL 3350 17 G PO PACK: 17.0000 g | PACK | Freq: Two times a day (BID) | ORAL | 0 refills | Status: DC

## 2016-01-08 NOTE — Progress Notes (Signed)
CSW consulted for SNF placement. PN reviewed. Pt plans to d/c to daughter's home when stable. RNCM will assist with d/c planning needs.  Werner Lean LCSW [phone]

## 2016-01-08 NOTE — Evaluation (Signed)
Occupational Therapy Evaluation Patient Details Name: Wanda Brooks MRN: [id number] DOB: 1936/02/17 Today's Date: 01/08/2016    History of Present Illness Pt s/p revision of R THR with hx of multiple dislocations as well as CAD, COPD, CHF, CABG and MI   Clinical Impression   This 80 year old female was admitted for the above. All education was completed.  No further OT is needed at this time    Follow Up Recommendations  Supervision/Assistance - 24 hour    Equipment Recommendations  3 in 1 bedside comode    Recommendations for Other Services       Precautions / Restrictions Precautions Precautions: Fall;Posterior Hip Precaution Booklet Issued: Yes (comment) Restrictions Weight Bearing Restrictions: Yes RLE Weight Bearing: Partial weight bearing RLE Partial Weight Bearing Percentage or Pounds: 50%      Mobility Bed Mobility by PT Overal bed mobility: Needs Assistance Bed Mobility: Supine to Sit     Supine to sit: Min assist     General bed mobility comments: oob  Transfers Overall transfer level: Needs assistance Equipment used: Rolling walker (2 wheeled) Transfers: Sit to/from Stand Sit to Stand: Min guard         General transfer comment: cues for LE placement    Balance                                            ADL Overall ADL's : Needs assistance/impaired     Grooming: Min guard;Standing;Wash/dry hands               Lower Body Dressing: Minimal assistance;Sit to/from stand;With adaptive equipment (pants only; overall max A--daughter will assist)   Toilet Transfer: Min guard;Ambulation;BSC;RW   Toileting- Water quality scientist and Hygiene: Min guard;Sit to/from stand         General ADL Comments: ambulated to bathroom with min guard. Pt was following THPs without cues.  Talked to her and daughter about sponge bathing initially as she is PWB and she has a tub.  Daughter will assist with adls, but pt does have a  reacher.  She practiced donning underwear with this     Vision     Perception     Praxis      Pertinent Vitals/Pain Pain Assessment: 0-10 Pain Score: 2  Pain Location: R hip Pain Descriptors / Indicators: Sore Pain Intervention(s): Limited activity within patient's tolerance;Monitored during session;Premedicated before session;Repositioned;Ice applied     Hand Dominance     Extremity/Trunk Assessment Upper Extremity Assessment Upper Extremity Assessment: Overall WFL for tasks assessed      Cervical / Trunk Assessment Cervical / Trunk Assessment: Kyphotic   Communication Communication Communication: HOH   Cognition Arousal/Alertness: Awake/alert Behavior During Therapy: WFL for tasks assessed/performed Overall Cognitive Status: Within Functional Limits for tasks assessed                     General Comments       Exercises      Shoulder Instructions      Home Living Family/patient expects to be discharged to:: Private residence Living Arrangements: Children Available Help at Discharge: Family Type of Home: House Home Access: Ramped entrance     Home Layout: One level     Bathroom Shower/Tub: Tub/shower unit (grab bars and seat)   Bathroom Toilet: Standard     Home Equipment: Environmental consultant - 2 wheels;Cane -  single point;Wheelchair - manual   Additional Comments: needs 3:1, had borrowed one before      Prior Functioning/Environment Level of Independence: Independent with assistive device(s)        Comments: Pt used RW and since last dislocation has been doing stand pvts only        OT Problem List:     OT Treatment/Interventions:      OT Goals(Current goals can be found in the care plan section) Acute Rehab OT Goals Patient Stated Goal: Walk again OT Goal Formulation: All assessment and education complete, DC therapy  OT Frequency:     Barriers to D/C:            Co-evaluation              End of Session    Activity  Tolerance: Patient tolerated treatment well Patient left: in chair;with call bell/phone within reach;with family/visitor present   Time: 1222-1247 OT Time Calculation (min): 25 min Charges:  OT General Charges $OT Visit: 1 Procedure OT Evaluation $OT Eval Low Complexity: 1 Procedure G-Codes:    Ronold Hardgrove January 23, 2016, 12:58 PM  Lesle Chris, OTR/L [phone] 01/23/16

## 2016-01-08 NOTE — Progress Notes (Signed)
Physical Therapy Treatment Patient Details Name: Wanda Brooks MRN: [id number] DOB: 1935-09-15 Today's Date: 01/08/2016    History of Present Illness Pt s/p revision of R THR with hx of multiple dislocations as well as CAD, COPD, CHF, CABG and MI    PT Comments    Pt very motivated and progressing well with mobility.  Dtr present and reviewed bed mobility, THP and car transfers.  Follow Up Recommendations  Home health PT     Equipment Recommendations  3in1 (PT)    Recommendations for Other Services OT consult     Precautions / Restrictions Precautions Precautions: Fall;Posterior Hip Precaution Booklet Issued: Yes (comment) Precaution Comments: Pt recalls 3/3 THp with min cues Restrictions Weight Bearing Restrictions: Yes RLE Weight Bearing: Partial weight bearing RLE Partial Weight Bearing Percentage or Pounds: 50%    Mobility  Bed Mobility Overal bed mobility: Needs Assistance Bed Mobility: Supine to Sit;Sit to Supine     Supine to sit: Min assist Sit to supine: Min assist   General bed mobility comments: cues for sequence, use of L LE to self assist and adherence to THP  Transfers Overall transfer level: Needs assistance Equipment used: Rolling walker (2 wheeled) Transfers: Sit to/from Stand Sit to Stand: Min guard         General transfer comment: cues for LE placement and use of UEs to self assist  Ambulation/Gait Ambulation/Gait assistance: Min guard Ambulation Distance (Feet): 50 Feet Assistive device: Rolling walker (2 wheeled) Gait Pattern/deviations: Step-to pattern;Decreased step length - right;Decreased step length - left;Shuffle;Trunk flexed Gait velocity: decr Gait velocity interpretation: Below normal speed for age/gender General Gait Details: cues for sequence, posture and position from [employer]            Wheelchair Mobility    Modified Rankin (Stroke Patients Only)       Balance                                    Cognition Arousal/Alertness: Awake/alert Behavior During Therapy: WFL for tasks assessed/performed Overall Cognitive Status: Within Functional Limits for tasks assessed                      Exercises      General Comments        Pertinent Vitals/Pain Pain Assessment: 0-10 Pain Score: 2  Pain Location: R hip Pain Descriptors / Indicators: Aching;Sore Pain Intervention(s): Limited activity within patient's tolerance;Monitored during session;Premedicated before session;Ice applied    Home Living                      Prior Function            PT Goals (current goals can now be found in the care plan section) Acute Rehab PT Goals Patient Stated Goal: Walk again PT Goal Formulation: With patient Time For Goal Achievement: 01/11/16 Potential to Achieve Goals: Good Progress towards PT goals: Progressing toward goals    Frequency    7X/week      PT Plan Current plan remains appropriate    Co-evaluation             End of Session Equipment Utilized During Treatment: Gait belt Activity Tolerance: Patient tolerated treatment well Patient left: in bed;with call bell/phone within reach;with family/visitor present     Time: 4098-1191 PT Time Calculation (min) (ACUTE ONLY): 31 min  Charges:  $Gait  Training: 8-22 mins $Therapeutic Activity: 8-22 mins                    G Codes:      Mahum Betten 01-11-16, 5:09 PM

## 2016-01-08 NOTE — Progress Notes (Signed)
     Subjective: 1 Day Post-Op Procedure(s) (LRB): RIGHT TOTAL HIP REVISION (Right)   Patient reports pain as mild, pain controlled well. No reported events throughout the night. Patient's daughter feels that she is doing well. Patient states that the hip feels significantly better as compared to prior to surgery. Discussed the PWB status, patient's daughter states that she has been walking with a walker already for many years.  Patient's plan is to be discharged to the daughter's house. Patient ready be discharged, if she does well with therapy.  Objective:   VITALS:   Vitals:   01/08/16 0137 01/08/16 0605  BP: (!) 159/45 (!) 153/64  Pulse: 60 75  Resp: 16 16  Temp: 98.6 F (37 C) 98.4 F (36.9 C)    Dorsiflexion/Plantar flexion intact Incision: dressing C/Brooks/I No cellulitis present Compartment soft  LABS  Recent Labs  01/08/16 0440  HGB 10.2*  HCT 30.0*  WBC 13.3*  PLT 227     Recent Labs  01/07/16 1027 01/08/16 0440  NA 139 136  K 5.3* 4.9  BUN 41* 43*  CREATININE 1.44* 1.51*  GLUCOSE 128* 150*     Assessment/Plan: 1 Day Post-Op Procedure(s) (LRB): RIGHT TOTAL HIP REVISION (Right) Foley cath Brooks/c'ed Advance diet Up with therapy Brooks/C IV fluids Discharge home Follow up in 2 weeks at [facility]. Follow up with OLIN,Wanda Brooks in 2 weeks.  Contact information:  [facility] [address] [place] Wanda Brooks   PAC  01/08/2016, 8:52 AM

## 2016-01-08 NOTE — Discharge Instructions (Signed)
INSTRUCTIONS AFTER JOINT REPLACEMENT  ° °o Remove items at home which could result in a fall. This includes throw rugs or furniture in walking pathways °o ICE to the affected joint every three hours while awake for 30 minutes at a time, for at least the first 3-5 days, and then as needed for pain and swelling.  Continue to use ice for pain and swelling. You may notice swelling that will progress down to the foot and ankle.  This is normal after surgery.  Elevate your leg when you are not up walking on it.   °o Continue to use the breathing machine you got in the hospital (incentive spirometer) which will help keep your temperature down.  It is common for your temperature to cycle up and down following surgery, especially at night when you are not up moving around and exerting yourself.  The breathing machine keeps your lungs expanded and your temperature down. ° ° °DIET:  As you were doing prior to hospitalization, we recommend a well-balanced diet. ° °DRESSING / WOUND CARE / SHOWERING ° °Keep the surgical dressing until follow up.  The dressing is water proof, so you can shower without any extra covering.  IF THE DRESSING FALLS OFF or the wound gets wet inside, change the dressing with sterile gauze.  Please use good hand washing techniques before changing the dressing.  Do not use any lotions or creams on the incision until instructed by your surgeon.   ° °ACTIVITY ° °o Increase activity slowly as tolerated, but follow the weight bearing instructions below.   °o No driving for 6 weeks or until further direction given by your physician.  You cannot drive while taking narcotics.  °o No lifting or carrying greater than 10 lbs. until further directed by your surgeon. °o Avoid periods of inactivity such as sitting longer than an hour when not asleep. This helps prevent blood clots.  °o You may return to work once you are authorized by your doctor.  ° ° ° °WEIGHT BEARING  ° °Partial weight bearing with assist device as  directed.  50% right lower extremity ° ° °EXERCISES ° °Results after joint replacement surgery are often greatly improved when you follow the exercise, range of motion and muscle strengthening exercises prescribed by your doctor. Safety measures are also important to protect the joint from further injury. Any time any of these exercises cause you to have increased pain or swelling, decrease what you are doing until you are comfortable again and then slowly increase them. If you have problems or questions, call your caregiver or physical therapist for advice.  ° °Rehabilitation is important following a joint replacement. After just a few days of immobilization, the muscles of the leg can become weakened and shrink (atrophy).  These exercises are designed to build up the tone and strength of the thigh and leg muscles and to improve motion. Often times heat used for twenty to thirty minutes before working out will loosen up your tissues and help with improving the range of motion but do not use heat for the first two weeks following surgery (sometimes heat can increase post-operative swelling).  ° °These exercises can be done on a training (exercise) mat, on the floor, on a table or on a bed. Use whatever works the best and is most comfortable for you.    Use music or television while you are exercising so that the exercises are a pleasant break in your day. This will make your life better   with the exercises acting as a break in your routine that you can look forward to.   Perform all exercises about fifteen times, three times per day or as directed.  You should exercise both the operative leg and the other leg as well.  Exercises include:    Quad Sets - Tighten up the muscle on the front of the thigh (Quad) and hold for 5-10 seconds.    Straight Leg Raises - With your knee straight (if you were given a brace, keep it on), lift the leg to 60 degrees, hold for 3 seconds, and slowly lower the leg.  Perform this  exercise against resistance later as your leg gets stronger.   Leg Slides: Lying on your back, slowly slide your foot toward your buttocks, bending your knee up off the floor (only go as far as is comfortable). Then slowly slide your foot back down until your leg is flat on the floor again.   Angel Wings: Lying on your back spread your legs to the side as far apart as you can without causing discomfort.   Hamstring Strength:  Lying on your back, push your heel against the floor with your leg straight by tightening up the muscles of your buttocks.  Repeat, but this time bend your knee to a comfortable angle, and push your heel against the floor.  You may put a pillow under the heel to make it more comfortable if necessary.   A rehabilitation program following joint replacement surgery can speed recovery and prevent re-injury in the future due to weakened muscles. Contact your doctor or a physical therapist for more information on knee rehabilitation.    CONSTIPATION  Constipation is defined medically as fewer than three stools per week and severe constipation as less than one stool per week.  Even if you have a regular bowel pattern at home, your normal regimen is likely to be disrupted due to multiple reasons following surgery.  Combination of anesthesia, postoperative narcotics, change in appetite and fluid intake all can affect your bowels.   YOU MUST use at least one of the following options; they are listed in order of increasing strength to get the job done.  They are all available over the counter, and you may need to use some, POSSIBLY even all of these options:    Drink plenty of fluids (prune juice may be helpful) and high fiber foods Colace 100 mg by mouth twice a day  Senokot for constipation as directed and as needed Dulcolax (bisacodyl), take with full glass of water  Miralax (polyethylene glycol) once or twice a day as needed.  If you have tried all these things and are unable to  have a bowel movement in the first 3-4 days after surgery call either your surgeon or your primary doctor.    If you experience loose stools or diarrhea, hold the medications until you stool forms back up.  If your symptoms do not get better within 1 week or if they get worse, check with your doctor.  If you experience "the worst abdominal pain ever" or develop nausea or vomiting, please contact the office immediately for further recommendations for treatment.   ITCHING:  If you experience itching with your medications, try taking only a single pain pill, or even half a pain pill at a time.  You can also use Benadryl over the counter for itching or also to help with sleep.   TED HOSE STOCKINGS:  Use stockings on both legs until  for at least 2 weeks or as directed by physician office. They may be removed at night for sleeping. ° °MEDICATIONS:  See your medication summary on the “After Visit Summary” that nursing will review with you.  You may have some home medications which will be placed on hold until you complete the course of blood thinner medication.  It is important for you to complete the blood thinner medication as prescribed. ° °PRECAUTIONS:  If you experience chest pain or shortness of breath - call 911 immediately for transfer to the hospital emergency department.  ° °If you develop a fever greater that 101 F, purulent drainage from wound, increased redness or drainage from wound, foul odor from the wound/dressing, or calf pain - CONTACT YOUR SURGEON.   °                                                °FOLLOW-UP APPOINTMENTS:  If you do not already have a post-op appointment, please call the office for an appointment to be seen by your surgeon.  Guidelines for how soon to be seen are listed in your “After Visit Summary”, but are typically between 1-4 weeks after surgery. ° °OTHER INSTRUCTIONS:  ° °Knee Replacement:  Do not place pillow under knee, focus on keeping the knee straight while resting.   ° °MAKE SURE YOU:  °• Understand these instructions.  °• Get help right away if you are not doing well or get worse.  ° ° °Thank you for letting us be a part of your medical care team.  It is a privilege we respect greatly.  We hope these instructions will help you stay on track for a fast and full recovery!  °  °

## 2016-01-08 NOTE — Care Management Note (Signed)
Case Management Note  Patient Details  Name: Cherri Yera MRN: [id number] Date of Birth: 1935/08/20  Subjective/Objective:                  RIGHT TOTAL HIP REVISION (Right)  Action/Plan: Discharge planning Expected Discharge Date:  01/08/16               Expected Discharge Plan:  [place]  In-House Referral:     Discharge planning Services  CM Consult  Post Acute Care Choice:  Home Health Choice offered to:  Patient  DME Arranged:  3-N-1 DME Agency:  [place]:  PT [place]:  [facility]  Status of Service:  Completed, signed off  If discussed at [employer] of Stay Meetings, dates discussed:    Additional Comments: Cm met with pt and pt's daughter, [name] who the pt will be going home with [address] [place] [address] [phone].  Family choose [facility].  Referral called to [facility] rep, Tim.  CM notified [facility] [employer] rep, Jeneen Rinks to please deliver the 3n1 to room prior to discharge.  NO other CM needs were communicated. Dellie Catholic, RN 01/08/2016, 2:42 PM

## 2016-01-08 NOTE — Op Note (Signed)
Wanda Brooks, Wanda Brooks NO.:  [id number]  MEDICAL RECORD NO.:  [id number]  LOCATION:  7408                         FACILITY:  [facility]  PHYSICIAN:  Pietro Cassis. Alvan Dame, M.D.  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  No signs of infection.  POSTOPERATIVE DIAGNOSES: 1. Failed right total hip arthroplasty with related instability and     acetabular failure. 2. 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  PROCEDURE:  Revision right total hip arthroplasty including an excisional and nonexcisional debridement of the right hip involving the entire surface of the hip joint from an 8-inch incision to particularly pericapsular tissues that was metal stained as well as nonviable tissue in the acetabular region.  Revision right total hip replacement with components utilized from [employer].  She had a previously placed [employer] stem.  We otherwise used a size 68 G7 OsseoTi acetabular shell multi- hole with a G7 dual mobility acetabular metal liner bearing size 54 to match the 68 cup, size 28 x 48 mm E1 antioxidant infused dual mobility bearing and a 28+ 6 modular Cobalt [employer]ium head.  In addition, I used 10 mL of cancellous bone graft and 3 acetabular screws.  SURGEON:  Pietro Cassis. Alvan Dame, M.D.  ASSISTANT:  Judith Part. Chabon, PA-C.  Note that, Mr. Margit Banda was present for the entirety of the case from preoperative positioning to perioperative management of operative extremity, general facilitation of the case, and primary wound closure.  ANESTHESIA:  Spinal.  SPECIMENS:  None.  COMPLICATIONS:  None apparent.  BLOOD LOSS:  200 mL.  DRAINS:  None.  INDICATIONS FOR PROCEDURE:   Ms. Wanda Brooks is a 80 year old female who is self-referred by her daughter for evaluation of her right hip.  She had a previous right total hip arthroplasty with subsequent revision surgery performed at an outside facility.  She subsequently developed multiple episodes of instability.  They were told by the outside surgeon that there was nothing more that could be done.  Her daughter sought second opinion.  She was seen and evaluated in the office and noted to have significant acetabular failure with medialization of the cup as well as retroversion.  This was associated with multiple episodes of dislocation.  She was seen and evaluated with her daughter and I had a lengthy discussion regarding surgical options including Girdlestone type procedure for further better evaluation of the acetabulum with CT scan in bone healing as opposed to the chance for possible primary revision surgery.  She understood the risks of infection, DVT, recurrent dislocation, the risks of infection, DVT.  Consent was obtained for benefit of improved function and pain relief.  PROCEDURE IN DETAIL:  The patient was brought to the operative theater. Once adequate anesthesia and preoperative antibiotics, 1 g of tranexamic acid and Decadron administered.  She was positioned into the left lateral decubitus position with the  right side up.  The right lower extremity was then prepped and draped in a sterile fashion.  Time-out was performed identifying the patient, planned procedure, and extremity. She was noted to have a lengthy Southern based approach to the hip.  I used a portion of this incision.  The skin scar was excised in this area.  Soft tissue plane was created.  I then dissected down to the iliotibial band and gluteal fascia.  Once we entered this layer, we encountered a significant amount of slightly stained fluid, no signs of infection, more inflammatory and metal appearance anything else.  Once I had got  into this layer, we did identify significant metallosis changes to the soft tissue and a denuded greater trochanter consistent with significant gluteus injury.  A portion of the case at this time was taken at debridement of this metal stained synovial lining exposing the joint and removing all nonviable tissue.  In doing so, we identified the hip was significantly unstable subluxating in and out as we were moving around easily.  Once I had the posterior aspect of the hip exposed, I did dislocate the hip formally and removed the femoral head from the trunnion.  I was then able to retract the femur anteriorly.  With this, I was able to further expose the acetabulum again debriding all nonviable tissue around it.  At this point, I identified that the cup was loose with mobility that had 4 screws in place.  She was noted to have a very thick anterior column, which I was able to rest a retractor on comfortably.  With the hip exposed, I then removed the acetabular liner and subsequently the 4 cancellous screws within the ilium.  At this point, the cup was removed easily.  At this point, inspection of the acetabulum was carried out.  I used a large curette and debrided all nonviable tissue within the acetabulum.  We did identify as noted radiographically significant medial wall damage, but there was no evidence of pelvic discontinuity.  Particularly noting that she had a very stout anterior column, she did have an intact posterior ischium, very thin posterior wall nearly absent up to the ilium, but there was no pelvic discontinuity.  Given these findings, I evaluated by inspection first with the use of reamers just for sizing.  The bone in this area was consistent without chronic metallosis change with not a lot of active punctate bleeding. At this point, I elected to began reaming with a 58 reamer and reamed a little bit into the anterior wall and column in order to get to some bleeding  bone.  I then reamed up sequentially to 67 from reamer where I was able to get purchase on the draining posterior column particularly the ischium and the posterior superior ilium region.  I also had great purchase anteriorly.  At this point, I drilled holes into the ilium to try to stimulate further bone healing response by stimulating bleeding bone.  I did use 10 mL of cancellous bone graft and placed it into the medial wall area. We then selected this 68 mm G7 OsseoTi shell and impacted it.  I impacted it with about 20 to 25 degrees of forward flexion and 40 degrees of abduction.  It had a good scratch fit.  I was able to move the whole pelvis with the cup attached to the implant rod.  With the cup in a good position and good initial scratch fit, I was able to get 3 screws  placed into the ilium with good secondary fixation. Based on the instability of the denuded greater trochanter, decision was to use a dual mobility type hip system.  Based on this, I went ahead and impacted the appropriate metal liner for this system, which was a 54 dual mobility acetabular liner, it was impacted with good visualized rim fit.  At this point, I did do a trial reduction first with 28+ 3 ball and the appropriate 28 x 54 dual mobility bearing.  With this, I did find the hip was stable; however, evaluating her leg lengths, she did feel that shortening was easily reduced and thus I elected to use the 28+ 6 metal ball.  At this point, based on the incision, I used the pulse lavage and at this point, did non-excisional debridement of this right hip joint. Following this, the final dual mobility construct was then made on the back table composed of the 28+ 6 metal ball placed into the 54 dual mobility bearing.  This was then impacted on the clean and dried trunnion and the hip reduced.  We again irrigated the hip.  Based on the soft tissue debridement of the posterior aspect hip, there was no capsule to  repair.  I reapproximated the iliotibial band and gluteal fascia represented a good lateral soft tissue envelope to this construct.  The remainder of the wound was closed with 2-0 Vicryl and running 3-0 Monocryl.  The hip was cleaned, dried, and dressed sterilely using surgical glue and Aquacel dressing.  She was then brought to the recovery room in stable condition.  Allow her to be partial weightbearing for 4 to 6 weeks to allow for bony healing.  Findings were reviewed with her daughter.     Pietro Cassis Alvan Dame, M.D.     MDO/MEDQ  D:  01/07/2016  T:  01/08/2016  Job:  [id number]

## 2016-01-08 NOTE — Evaluation (Signed)
Physical Therapy Evaluation Patient Details Name: Wanda Brooks MRN: [id number] DOB: Jan 27, 1936 Today's Date: 01/08/2016   History of Present Illness  Pt s/p revision of R THR with hx of multiple dislocations as well as CAD, COPD, CHF, CABG and MI  Clinical Impression  Pt s/p R THR revision presents with decreased R LE strength/ROM, post op pain, posterior THP and PWB on R LE limiting functional mobility.  Pt should progress to dc home with family assist and would benefit from follow up [place].    Follow Up Recommendations Home health PT    Equipment Recommendations  3in1 (PT)    Recommendations for Other Services OT consult     Precautions / Restrictions Precautions Precautions: Fall;Posterior Hip Precaution Booklet Issued: Yes (comment) Restrictions Weight Bearing Restrictions: Yes RLE Weight Bearing: Partial weight bearing RLE Partial Weight Bearing Percentage or Pounds: 50%      Mobility  Bed Mobility Overal bed mobility: Needs Assistance Bed Mobility: Supine to Sit     Supine to sit: Min assist     General bed mobility comments: cues for sequence, use of L LE to self assist and adherence to THP  Transfers Overall transfer level: Needs assistance Equipment used: Rolling walker (2 wheeled) Transfers: Sit to/from Stand Sit to Stand: Min assist         General transfer comment: cues for LE management, THP, and use of UEs to self assist  Ambulation/Gait Ambulation/Gait assistance: Min assist Ambulation Distance (Feet): 36 Feet Assistive device: Rolling walker (2 wheeled) Gait Pattern/deviations: Step-to pattern;Decreased step length - right;Decreased step length - left;Shuffle;Trunk flexed Gait velocity: decr Gait velocity interpretation: Below normal speed for age/gender General Gait Details: cues for sequence, posture and position from [employer]            Wheelchair Mobility    Modified Rankin (Stroke Patients Only)       Balance                                             Pertinent Vitals/Pain Pain Assessment: 0-10 Pain Score: 3  Pain Location: R hip Pain Descriptors / Indicators: Aching;Sore Pain Intervention(s): Limited activity within patient's tolerance;Monitored during session;Premedicated before session;Ice applied    Home Living Family/patient expects to be discharged to:: Private residence Living Arrangements: Children Available Help at Discharge: Family Type of Home: House Home Access: [place]: One [place]: Environmental consultant - 2 wheels;Cane - single point;Wheelchair - manual      Prior Function Level of Independence: Independent with assistive device(s)         Comments: Pt used RW and since last dislocation has been doing stand pvts only     Hand Dominance        Extremity/Trunk Assessment   Upper Extremity Assessment: Overall WFL for tasks assessed           Lower Extremity Assessment: RLE deficits/detail RLE Deficits / Details: Strength at hip 2+/5 with AAROM at hip to 80 flex and 30 abd    Cervical / Trunk Assessment: Kyphotic  Communication   Communication: HOH  Cognition Arousal/Alertness: Awake/alert Behavior During Therapy: WFL for tasks assessed/performed Overall Cognitive Status: Within Functional Limits for tasks assessed                      General Comments  Exercises Total Joint Exercises Ankle Circles/Pumps: AROM;Both;15 reps;Supine Quad Sets: AROM;Both;10 reps;Supine Heel Slides: AAROM;Right;Supine;15 reps Hip ABduction/ADduction: AAROM;Right;15 reps;Supine   Assessment/Plan    PT Assessment Patient needs continued PT services  PT Problem List Decreased strength;Decreased range of motion;Decreased activity tolerance;Decreased mobility;Pain;Decreased knowledge of use of DME;Decreased knowledge of precautions          PT Treatment Interventions DME instruction;Gait training;Functional mobility  training;Therapeutic activities;Therapeutic exercise;Patient/family education    PT Goals (Current goals can be found in the Care Plan section)  Acute Rehab PT Goals Patient Stated Goal: Walk again PT Goal Formulation: With patient Time For Goal Achievement: 01/11/16 Potential to Achieve Goals: Good    Frequency 7X/week   Barriers to discharge        Co-evaluation               End of Session Equipment Utilized During Treatment: Gait belt Activity Tolerance: Patient tolerated treatment well Patient left: in chair;with call bell/phone within reach;with chair alarm set;with family/visitor present Nurse Communication: Mobility status         Time: 1010-1043 PT Time Calculation (min) (ACUTE ONLY): 33 min   Charges:   PT Evaluation $PT Eval Low Complexity: 1 Procedure PT Treatments $Therapeutic Exercise: 8-22 mins   PT G Codes:        Keith Cancio Feb 01, 2016, 12:31 PM

## 2016-01-10 ENCOUNTER — Encounter: Payer: Self-pay | Admitting: *Deleted

## 2016-01-10 ENCOUNTER — Ambulatory Visit: Payer: Medicare Other | Admitting: Cardiovascular Disease

## 2016-01-13 DIAGNOSIS — J441 Chronic obstructive pulmonary disease with (acute) exacerbation: Secondary | ICD-10-CM | POA: Diagnosis not present

## 2016-01-13 DIAGNOSIS — N183 Chronic kidney disease, stage 3 (moderate): Secondary | ICD-10-CM | POA: Diagnosis not present

## 2016-01-13 DIAGNOSIS — T84020D Dislocation of internal right hip prosthesis, subsequent encounter: Secondary | ICD-10-CM | POA: Diagnosis not present

## 2016-01-13 DIAGNOSIS — I5031 Acute diastolic (congestive) heart failure: Secondary | ICD-10-CM | POA: Diagnosis not present

## 2016-01-13 DIAGNOSIS — I2510 Atherosclerotic heart disease of native coronary artery without angina pectoris: Secondary | ICD-10-CM | POA: Diagnosis not present

## 2016-01-13 DIAGNOSIS — I130 Hypertensive heart and chronic kidney disease with heart failure and stage 1 through stage 4 chronic kidney disease, or unspecified chronic kidney disease: Secondary | ICD-10-CM | POA: Diagnosis not present
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
## 2016-01-17 DIAGNOSIS — T84020D Dislocation of internal right hip prosthesis, subsequent encounter: Secondary | ICD-10-CM | POA: Diagnosis not present

## 2016-01-17 DIAGNOSIS — N183 Chronic kidney disease, stage 3 (moderate): Secondary | ICD-10-CM | POA: Diagnosis not present

## 2016-01-17 DIAGNOSIS — I5031 Acute diastolic (congestive) heart failure: Secondary | ICD-10-CM | POA: Diagnosis not present

## 2016-01-17 DIAGNOSIS — J441 Chronic obstructive pulmonary disease with (acute) exacerbation: Secondary | ICD-10-CM | POA: Diagnosis not present

## 2016-01-17 DIAGNOSIS — I2510 Atherosclerotic heart disease of native coronary artery without angina pectoris: Secondary | ICD-10-CM | POA: Diagnosis not present

## 2016-01-17 DIAGNOSIS — I130 Hypertensive heart and chronic kidney disease with heart failure and stage 1 through stage 4 chronic kidney disease, or unspecified chronic kidney disease: Secondary | ICD-10-CM | POA: Diagnosis not present

## 2016-01-18 DIAGNOSIS — J441 Chronic obstructive pulmonary disease with (acute) exacerbation: Secondary | ICD-10-CM | POA: Diagnosis not present

## 2016-01-18 DIAGNOSIS — N183 Chronic kidney disease, stage 3 (moderate): Secondary | ICD-10-CM | POA: Diagnosis not present

## 2016-01-18 DIAGNOSIS — I130 Hypertensive heart and chronic kidney disease with heart failure and stage 1 through stage 4 chronic kidney disease, or unspecified chronic kidney disease: Secondary | ICD-10-CM | POA: Diagnosis not present

## 2016-01-18 DIAGNOSIS — I5031 Acute diastolic (congestive) heart failure: Secondary | ICD-10-CM | POA: Diagnosis not present

## 2016-01-18 DIAGNOSIS — T84020D Dislocation of internal right hip prosthesis, subsequent encounter: Secondary | ICD-10-CM | POA: Diagnosis not present

## 2016-01-18 DIAGNOSIS — I2510 Atherosclerotic heart disease of native coronary artery without angina pectoris: Secondary | ICD-10-CM | POA: Diagnosis not present

## 2016-01-20 DIAGNOSIS — T84020D Dislocation of internal right hip prosthesis, subsequent encounter: Secondary | ICD-10-CM | POA: Diagnosis not present

## 2016-01-20 DIAGNOSIS — I482 Chronic atrial fibrillation: Secondary | ICD-10-CM | POA: Diagnosis not present

## 2016-01-20 DIAGNOSIS — N183 Chronic kidney disease, stage 3 (moderate): Secondary | ICD-10-CM | POA: Diagnosis not present

## 2016-01-20 DIAGNOSIS — E1151 Type 2 diabetes mellitus with diabetic peripheral angiopathy without gangrene: Secondary | ICD-10-CM | POA: Diagnosis not present

## 2016-01-20 DIAGNOSIS — I5033 Acute on chronic diastolic (congestive) heart failure: Secondary | ICD-10-CM | POA: Diagnosis not present

## 2016-01-20 DIAGNOSIS — I130 Hypertensive heart and chronic kidney disease with heart failure and stage 1 through stage 4 chronic kidney disease, or unspecified chronic kidney disease: Secondary | ICD-10-CM | POA: Diagnosis not present

## 2016-01-21 ENCOUNTER — Ambulatory Visit (INDEPENDENT_AMBULATORY_CARE_PROVIDER_SITE_OTHER): Payer: Medicare Other | Admitting: Cardiology

## 2016-01-21 ENCOUNTER — Encounter: Payer: Self-pay | Admitting: Cardiology

## 2016-01-21 VITALS — BP 170/71 | HR 66 | Ht 65.0 in | Wt 139.2 lb

## 2016-01-21 DIAGNOSIS — E1151 Type 2 diabetes mellitus with diabetic peripheral angiopathy without gangrene: Secondary | ICD-10-CM | POA: Diagnosis not present

## 2016-01-21 DIAGNOSIS — I10 Essential (primary) hypertension: Secondary | ICD-10-CM

## 2016-01-21 DIAGNOSIS — E785 Hyperlipidemia, unspecified: Secondary | ICD-10-CM

## 2016-01-21 DIAGNOSIS — I482 Chronic atrial fibrillation: Secondary | ICD-10-CM | POA: Diagnosis not present

## 2016-01-21 DIAGNOSIS — I5032 Chronic diastolic (congestive) heart failure: Secondary | ICD-10-CM

## 2016-01-21 DIAGNOSIS — Z950 Presence of cardiac pacemaker: Secondary | ICD-10-CM | POA: Diagnosis not present

## 2016-01-21 DIAGNOSIS — I4821 Permanent atrial fibrillation: Secondary | ICD-10-CM

## 2016-01-21 DIAGNOSIS — T84020D Dislocation of internal right hip prosthesis, subsequent encounter: Secondary | ICD-10-CM | POA: Diagnosis not present

## 2016-01-21 DIAGNOSIS — N183 Chronic kidney disease, stage 3 (moderate): Secondary | ICD-10-CM | POA: Diagnosis not present

## 2016-01-21 DIAGNOSIS — I25810 Atherosclerosis of coronary artery bypass graft(s) without angina pectoris: Secondary | ICD-10-CM | POA: Diagnosis not present

## 2016-01-21 DIAGNOSIS — I340 Nonrheumatic mitral (valve) insufficiency: Secondary | ICD-10-CM

## 2016-01-21 DIAGNOSIS — I5033 Acute on chronic diastolic (congestive) heart failure: Secondary | ICD-10-CM | POA: Diagnosis not present

## 2016-01-21 DIAGNOSIS — I130 Hypertensive heart and chronic kidney disease with heart failure and stage 1 through stage 4 chronic kidney disease, or unspecified chronic kidney disease: Secondary | ICD-10-CM | POA: Diagnosis not present

## 2016-01-21 NOTE — Patient Instructions (Signed)
Your physician has requested that you regularly monitor and record your blood pressure readings at home. Please use the same machine at the same time of day to check your readings and record them to bring to your follow-up visit.   Follow-Up: Your physician wants you to follow-up in: 6 months with Dr. Yvone Neu.  You will receive a reminder letter in the mail two months in advance. If you don't receive a letter, please call our office to schedule the follow-up appointment.  You have been referred to Dr. Caryl Comes to have your device checked.   Check with your primary care physician about a possible sleep study.  It was a pleasure seeing you today here in the office. Please do not hesitate to give [place] a call back if you have any further questions. [place], BSN
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
## 2016-01-22 ENCOUNTER — Encounter: Payer: Self-pay | Admitting: Internal Medicine

## 2016-01-22 ENCOUNTER — Ambulatory Visit (INDEPENDENT_AMBULATORY_CARE_PROVIDER_SITE_OTHER): Payer: Medicare Other | Admitting: Internal Medicine

## 2016-01-22 VITALS — BP 150/70 | HR 86 | Temp 97.7°F | Wt 139.0 lb

## 2016-01-22 DIAGNOSIS — I25810 Atherosclerosis of coronary artery bypass graft(s) without angina pectoris: Secondary | ICD-10-CM

## 2016-01-22 DIAGNOSIS — Z471 Aftercare following joint replacement surgery: Secondary | ICD-10-CM | POA: Diagnosis not present

## 2016-01-22 DIAGNOSIS — Z96641 Presence of right artificial hip joint: Secondary | ICD-10-CM | POA: Diagnosis not present
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
## 2016-01-22 NOTE — Patient Instructions (Signed)
Hip Pain  Your hip is the joint between your upper legs and your lower pelvis. The bones, cartilage, tendons, and muscles of your hip joint perform a lot of work each day supporting your body weight and allowing you to move around.  Hip pain can range from a minor ache to severe pain in one or both of your hips. Pain may be felt on the inside of the hip joint near the groin, or the outside near the buttocks and upper thigh. You may have swelling or stiffness as well.  Follow these instructions at home:  ? Take medicines only as directed by your health care provider.  ? Apply ice to the injured area:  ? Put ice in a plastic bag.  ? Place a towel between your skin and the bag.  ? Leave the ice on for 15?20 minutes at a time, 3?4 times a day.  ? Keep your leg raised (elevated) when possible to lessen swelling.  ? Avoid activities that cause pain.  ? Follow specific exercises as directed by your health care provider.  ? Sleep with a pillow between your legs on your most comfortable side.  ? Record how often you have hip pain, the location of the pain, and what it feels like.  Contact a health care provider if:  ? You are unable to put weight on your leg.  ? Your hip is red or swollen or very tender to touch.  ? Your pain or swelling continues or worsens after 1 week.  ? You have increasing difficulty walking.  ? You have a fever.  Get help right away if:  ? You have fallen.  ? You have a sudden increase in pain and swelling in your hip.  This information is not intended to replace advice given to you by your health care provider. Make sure you discuss any questions you have with your health care provider.  Document Released: 08/06/2009 Document Revised: 07/25/2015 Document Reviewed: 10/13/2012  Elsevier Interactive Patient Education ? 2017 [employer]

## 2016-01-24 DIAGNOSIS — I5033 Acute on chronic diastolic (congestive) heart failure: Secondary | ICD-10-CM | POA: Diagnosis not present

## 2016-01-24 DIAGNOSIS — E1151 Type 2 diabetes mellitus with diabetic peripheral angiopathy without gangrene: Secondary | ICD-10-CM | POA: Diagnosis not present

## 2016-01-24 DIAGNOSIS — I130 Hypertensive heart and chronic kidney disease with heart failure and stage 1 through stage 4 chronic kidney disease, or unspecified chronic kidney disease: Secondary | ICD-10-CM | POA: Diagnosis not present

## 2016-01-24 DIAGNOSIS — N183 Chronic kidney disease, stage 3 (moderate): Secondary | ICD-10-CM | POA: Diagnosis not present

## 2016-01-24 DIAGNOSIS — T84020D Dislocation of internal right hip prosthesis, subsequent encounter: Secondary | ICD-10-CM | POA: Diagnosis not present

## 2016-01-24 DIAGNOSIS — I482 Chronic atrial fibrillation: Secondary | ICD-10-CM | POA: Diagnosis not present

## 2016-01-28 DIAGNOSIS — I130 Hypertensive heart and chronic kidney disease with heart failure and stage 1 through stage 4 chronic kidney disease, or unspecified chronic kidney disease: Secondary | ICD-10-CM | POA: Diagnosis not present

## 2016-01-28 DIAGNOSIS — E1151 Type 2 diabetes mellitus with diabetic peripheral angiopathy without gangrene: Secondary | ICD-10-CM | POA: Diagnosis not present

## 2016-01-28 DIAGNOSIS — T84020D Dislocation of internal right hip prosthesis, subsequent encounter: Secondary | ICD-10-CM | POA: Diagnosis not present

## 2016-01-28 DIAGNOSIS — N183 Chronic kidney disease, stage 3 (moderate): Secondary | ICD-10-CM | POA: Diagnosis not present

## 2016-01-28 DIAGNOSIS — I482 Chronic atrial fibrillation: Secondary | ICD-10-CM | POA: Diagnosis not present

## 2016-01-28 DIAGNOSIS — I5033 Acute on chronic diastolic (congestive) heart failure: Secondary | ICD-10-CM | POA: Diagnosis not present

## 2016-01-30 DIAGNOSIS — E1151 Type 2 diabetes mellitus with diabetic peripheral angiopathy without gangrene: Secondary | ICD-10-CM | POA: Diagnosis not present

## 2016-01-30 DIAGNOSIS — N183 Chronic kidney disease, stage 3 (moderate): Secondary | ICD-10-CM | POA: Diagnosis not present

## 2016-01-30 DIAGNOSIS — I130 Hypertensive heart and chronic kidney disease with heart failure and stage 1 through stage 4 chronic kidney disease, or unspecified chronic kidney disease: Secondary | ICD-10-CM | POA: Diagnosis not present

## 2016-01-30 DIAGNOSIS — T84020D Dislocation of internal right hip prosthesis, subsequent encounter: Secondary | ICD-10-CM | POA: Diagnosis not present

## 2016-01-30 DIAGNOSIS — I5033 Acute on chronic diastolic (congestive) heart failure: Secondary | ICD-10-CM | POA: Diagnosis not present

## 2016-01-30 DIAGNOSIS — I482 Chronic atrial fibrillation: Secondary | ICD-10-CM | POA: Diagnosis not present

## 2016-02-04 DIAGNOSIS — N183 Chronic kidney disease, stage 3 (moderate): Secondary | ICD-10-CM | POA: Diagnosis not present

## 2016-02-04 DIAGNOSIS — E1151 Type 2 diabetes mellitus with diabetic peripheral angiopathy without gangrene: Secondary | ICD-10-CM | POA: Diagnosis not present

## 2016-02-04 DIAGNOSIS — I482 Chronic atrial fibrillation: Secondary | ICD-10-CM | POA: Diagnosis not present

## 2016-02-04 DIAGNOSIS — I130 Hypertensive heart and chronic kidney disease with heart failure and stage 1 through stage 4 chronic kidney disease, or unspecified chronic kidney disease: Secondary | ICD-10-CM | POA: Diagnosis not present

## 2016-02-04 DIAGNOSIS — I5033 Acute on chronic diastolic (congestive) heart failure: Secondary | ICD-10-CM | POA: Diagnosis not present

## 2016-02-04 DIAGNOSIS — T84020D Dislocation of internal right hip prosthesis, subsequent encounter: Secondary | ICD-10-CM | POA: Diagnosis not present

## 2016-02-05 DIAGNOSIS — I130 Hypertensive heart and chronic kidney disease with heart failure and stage 1 through stage 4 chronic kidney disease, or unspecified chronic kidney disease: Secondary | ICD-10-CM | POA: Diagnosis not present

## 2016-02-05 DIAGNOSIS — T84020D Dislocation of internal right hip prosthesis, subsequent encounter: Secondary | ICD-10-CM | POA: Diagnosis not present

## 2016-02-05 DIAGNOSIS — I5033 Acute on chronic diastolic (congestive) heart failure: Secondary | ICD-10-CM | POA: Diagnosis not present

## 2016-02-05 DIAGNOSIS — N183 Chronic kidney disease, stage 3 (moderate): Secondary | ICD-10-CM | POA: Diagnosis not present

## 2016-02-05 DIAGNOSIS — I482 Chronic atrial fibrillation: Secondary | ICD-10-CM | POA: Diagnosis not present

## 2016-02-05 DIAGNOSIS — E1151 Type 2 diabetes mellitus with diabetic peripheral angiopathy without gangrene: Secondary | ICD-10-CM | POA: Diagnosis not present

## 2016-02-06 DIAGNOSIS — I130 Hypertensive heart and chronic kidney disease with heart failure and stage 1 through stage 4 chronic kidney disease, or unspecified chronic kidney disease: Secondary | ICD-10-CM | POA: Diagnosis not present

## 2016-02-06 DIAGNOSIS — I482 Chronic atrial fibrillation: Secondary | ICD-10-CM | POA: Diagnosis not present

## 2016-02-06 DIAGNOSIS — T84020D Dislocation of internal right hip prosthesis, subsequent encounter: Secondary | ICD-10-CM | POA: Diagnosis not present

## 2016-02-06 DIAGNOSIS — E1151 Type 2 diabetes mellitus with diabetic peripheral angiopathy without gangrene: Secondary | ICD-10-CM | POA: Diagnosis not present

## 2016-02-06 DIAGNOSIS — N183 Chronic kidney disease, stage 3 (moderate): Secondary | ICD-10-CM | POA: Diagnosis not present

## 2016-02-06 DIAGNOSIS — I5033 Acute on chronic diastolic (congestive) heart failure: Secondary | ICD-10-CM | POA: Diagnosis not present

## 2016-02-07 ENCOUNTER — Other Ambulatory Visit: Payer: Self-pay

## 2016-02-07 MED ORDER — HYDROCODONE-ACETAMINOPHEN 7.5-325 MG PO TABS: 1.0000 | ORAL_TABLET | ORAL | 0 refills | Status: DC | PRN

## 2016-02-07 NOTE — Telephone Encounter (Signed)
RX printed and signed and placed in MYD box 

## 2016-02-07 NOTE — Telephone Encounter (Signed)
Wanda Brooks left v/m requesting rx hydrocodone apap. Call when ready for pick up. Last rx # [phone] on 01/08/16 by Danae Orleans PA. Last seen 01/22/16.

## 2016-02-10 NOTE — Telephone Encounter (Signed)
Daughter is aware---Rx left in front office for pick up

## 2016-02-11 DIAGNOSIS — E1151 Type 2 diabetes mellitus with diabetic peripheral angiopathy without gangrene: Secondary | ICD-10-CM | POA: Diagnosis not present

## 2016-02-11 DIAGNOSIS — T84020D Dislocation of internal right hip prosthesis, subsequent encounter: Secondary | ICD-10-CM | POA: Diagnosis not present

## 2016-02-11 DIAGNOSIS — I5033 Acute on chronic diastolic (congestive) heart failure: Secondary | ICD-10-CM | POA: Diagnosis not present

## 2016-02-11 DIAGNOSIS — N183 Chronic kidney disease, stage 3 (moderate): Secondary | ICD-10-CM | POA: Diagnosis not present

## 2016-02-11 DIAGNOSIS — I130 Hypertensive heart and chronic kidney disease with heart failure and stage 1 through stage 4 chronic kidney disease, or unspecified chronic kidney disease: Secondary | ICD-10-CM | POA: Diagnosis not present

## 2016-02-11 DIAGNOSIS — I482 Chronic atrial fibrillation: Secondary | ICD-10-CM | POA: Diagnosis not present

## 2016-02-13 DIAGNOSIS — N183 Chronic kidney disease, stage 3 (moderate): Secondary | ICD-10-CM | POA: Diagnosis not present

## 2016-02-13 DIAGNOSIS — I130 Hypertensive heart and chronic kidney disease with heart failure and stage 1 through stage 4 chronic kidney disease, or unspecified chronic kidney disease: Secondary | ICD-10-CM | POA: Diagnosis not present

## 2016-02-13 DIAGNOSIS — T84020D Dislocation of internal right hip prosthesis, subsequent encounter: Secondary | ICD-10-CM | POA: Diagnosis not present

## 2016-02-13 DIAGNOSIS — I482 Chronic atrial fibrillation: Secondary | ICD-10-CM | POA: Diagnosis not present

## 2016-02-13 DIAGNOSIS — E1151 Type 2 diabetes mellitus with diabetic peripheral angiopathy without gangrene: Secondary | ICD-10-CM | POA: Diagnosis not present

## 2016-02-13 DIAGNOSIS — I5033 Acute on chronic diastolic (congestive) heart failure: Secondary | ICD-10-CM | POA: Diagnosis not present

## 2016-02-17 DIAGNOSIS — N183 Chronic kidney disease, stage 3 (moderate): Secondary | ICD-10-CM | POA: Diagnosis not present

## 2016-02-17 DIAGNOSIS — I482 Chronic atrial fibrillation: Secondary | ICD-10-CM | POA: Diagnosis not present

## 2016-02-17 DIAGNOSIS — E1151 Type 2 diabetes mellitus with diabetic peripheral angiopathy without gangrene: Secondary | ICD-10-CM | POA: Diagnosis not present

## 2016-02-17 DIAGNOSIS — I130 Hypertensive heart and chronic kidney disease with heart failure and stage 1 through stage 4 chronic kidney disease, or unspecified chronic kidney disease: Secondary | ICD-10-CM | POA: Diagnosis not present

## 2016-02-17 DIAGNOSIS — T84020D Dislocation of internal right hip prosthesis, subsequent encounter: Secondary | ICD-10-CM | POA: Diagnosis not present

## 2016-02-17 DIAGNOSIS — I5033 Acute on chronic diastolic (congestive) heart failure: Secondary | ICD-10-CM | POA: Diagnosis not present

## 2016-02-19 DIAGNOSIS — Z471 Aftercare following joint replacement surgery: Secondary | ICD-10-CM | POA: Diagnosis not present

## 2016-02-19 DIAGNOSIS — Z96641 Presence of right artificial hip joint: Secondary | ICD-10-CM | POA: Diagnosis not present

## 2016-02-20 DIAGNOSIS — I482 Chronic atrial fibrillation: Secondary | ICD-10-CM | POA: Diagnosis not present

## 2016-02-20 DIAGNOSIS — N183 Chronic kidney disease, stage 3 (moderate): Secondary | ICD-10-CM | POA: Diagnosis not present

## 2016-02-20 DIAGNOSIS — I130 Hypertensive heart and chronic kidney disease with heart failure and stage 1 through stage 4 chronic kidney disease, or unspecified chronic kidney disease: Secondary | ICD-10-CM | POA: Diagnosis not present

## 2016-02-20 DIAGNOSIS — E1151 Type 2 diabetes mellitus with diabetic peripheral angiopathy without gangrene: Secondary | ICD-10-CM | POA: Diagnosis not present

## 2016-02-20 DIAGNOSIS — T84020D Dislocation of internal right hip prosthesis, subsequent encounter: Secondary | ICD-10-CM | POA: Diagnosis not present

## 2016-02-20 DIAGNOSIS — I5033 Acute on chronic diastolic (congestive) heart failure: Secondary | ICD-10-CM | POA: Diagnosis not present

## 2016-02-28 DIAGNOSIS — E1151 Type 2 diabetes mellitus with diabetic peripheral angiopathy without gangrene: Secondary | ICD-10-CM | POA: Diagnosis not present

## 2016-02-28 DIAGNOSIS — N183 Chronic kidney disease, stage 3 (moderate): Secondary | ICD-10-CM | POA: Diagnosis not present

## 2016-02-28 DIAGNOSIS — I482 Chronic atrial fibrillation: Secondary | ICD-10-CM | POA: Diagnosis not present

## 2016-02-28 DIAGNOSIS — I5033 Acute on chronic diastolic (congestive) heart failure: Secondary | ICD-10-CM | POA: Diagnosis not present

## 2016-02-28 DIAGNOSIS — I130 Hypertensive heart and chronic kidney disease with heart failure and stage 1 through stage 4 chronic kidney disease, or unspecified chronic kidney disease: Secondary | ICD-10-CM | POA: Diagnosis not present

## 2016-02-28 DIAGNOSIS — T84020D Dislocation of internal right hip prosthesis, subsequent encounter: Secondary | ICD-10-CM | POA: Diagnosis not present

## 2016-03-03 DIAGNOSIS — I482 Chronic atrial fibrillation: Secondary | ICD-10-CM | POA: Diagnosis not present

## 2016-03-03 DIAGNOSIS — I130 Hypertensive heart and chronic kidney disease with heart failure and stage 1 through stage 4 chronic kidney disease, or unspecified chronic kidney disease: Secondary | ICD-10-CM | POA: Diagnosis not present

## 2016-03-03 DIAGNOSIS — E1151 Type 2 diabetes mellitus with diabetic peripheral angiopathy without gangrene: Secondary | ICD-10-CM | POA: Diagnosis not present

## 2016-03-03 DIAGNOSIS — N183 Chronic kidney disease, stage 3 (moderate): Secondary | ICD-10-CM | POA: Diagnosis not present

## 2016-03-03 DIAGNOSIS — I5033 Acute on chronic diastolic (congestive) heart failure: Secondary | ICD-10-CM | POA: Diagnosis not present

## 2016-03-03 DIAGNOSIS — T84020D Dislocation of internal right hip prosthesis, subsequent encounter: Secondary | ICD-10-CM | POA: Diagnosis not present

## 2016-03-05 ENCOUNTER — Encounter: Payer: Self-pay | Admitting: Internal Medicine

## 2016-03-05 ENCOUNTER — Institutional Professional Consult (permissible substitution): Payer: Medicare Other | Admitting: Internal Medicine

## 2016-03-05 ENCOUNTER — Ambulatory Visit (INDEPENDENT_AMBULATORY_CARE_PROVIDER_SITE_OTHER): Payer: Medicare Other | Admitting: Internal Medicine

## 2016-03-05 VITALS — BP 140/62 | HR 65 | Ht 65.0 in | Wt 143.8 lb

## 2016-03-05 DIAGNOSIS — I25810 Atherosclerosis of coronary artery bypass graft(s) without angina pectoris: Secondary | ICD-10-CM | POA: Diagnosis not present

## 2016-03-05 DIAGNOSIS — I482 Chronic atrial fibrillation, unspecified: Secondary | ICD-10-CM

## 2016-03-05 DIAGNOSIS — I509 Heart failure, unspecified: Secondary | ICD-10-CM

## 2016-03-05 MED ORDER — APIXABAN 5 MG PO TABS: 5.0000 mg | ORAL_TABLET | Freq: Two times a day (BID) | ORAL | Status: DC

## 2016-03-05 NOTE — Progress Notes (Signed)
Sure. Thank you!
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
## 2016-03-05 NOTE — Patient Instructions (Addendum)
Medication Instructions: - Your physician has recommended you make the following change in your medication:  1) Increase Eliquis to 5 mg- take one tablet by mouth twice daily  Labwork: - none ordered  Procedures/Testing: - none ordered  Follow-Up: - Your physician recommends that you schedule a follow-up appointment in: 3 months with Dr. Caryl Comes.   Any Additional Special Instructions Will Be Listed Below (If Applicable).     If you need a refill on your cardiac medications before your next appointment, please call your pharmacy.

## 2016-03-06 DIAGNOSIS — I130 Hypertensive heart and chronic kidney disease with heart failure and stage 1 through stage 4 chronic kidney disease, or unspecified chronic kidney disease: Secondary | ICD-10-CM | POA: Diagnosis not present

## 2016-03-06 DIAGNOSIS — T84020D Dislocation of internal right hip prosthesis, subsequent encounter: Secondary | ICD-10-CM | POA: Diagnosis not present

## 2016-03-06 DIAGNOSIS — I5033 Acute on chronic diastolic (congestive) heart failure: Secondary | ICD-10-CM | POA: Diagnosis not present

## 2016-03-06 DIAGNOSIS — E1151 Type 2 diabetes mellitus with diabetic peripheral angiopathy without gangrene: Secondary | ICD-10-CM | POA: Diagnosis not present

## 2016-03-06 DIAGNOSIS — I482 Chronic atrial fibrillation: Secondary | ICD-10-CM | POA: Diagnosis not present

## 2016-03-06 DIAGNOSIS — N183 Chronic kidney disease, stage 3 (moderate): Secondary | ICD-10-CM | POA: Diagnosis not present

## 2016-03-10 ENCOUNTER — Ambulatory Visit: Payer: Medicare Other | Admitting: Internal Medicine

## 2016-03-10 DIAGNOSIS — N183 Chronic kidney disease, stage 3 (moderate): Secondary | ICD-10-CM | POA: Diagnosis not present

## 2016-03-10 DIAGNOSIS — I130 Hypertensive heart and chronic kidney disease with heart failure and stage 1 through stage 4 chronic kidney disease, or unspecified chronic kidney disease: Secondary | ICD-10-CM | POA: Diagnosis not present

## 2016-03-10 DIAGNOSIS — T84020D Dislocation of internal right hip prosthesis, subsequent encounter: Secondary | ICD-10-CM | POA: Diagnosis not present

## 2016-03-10 DIAGNOSIS — I482 Chronic atrial fibrillation: Secondary | ICD-10-CM | POA: Diagnosis not present

## 2016-03-10 DIAGNOSIS — I5033 Acute on chronic diastolic (congestive) heart failure: Secondary | ICD-10-CM | POA: Diagnosis not present

## 2016-03-10 DIAGNOSIS — E1151 Type 2 diabetes mellitus with diabetic peripheral angiopathy without gangrene: Secondary | ICD-10-CM | POA: Diagnosis not present

## 2016-03-12 DIAGNOSIS — I5033 Acute on chronic diastolic (congestive) heart failure: Secondary | ICD-10-CM | POA: Diagnosis not present

## 2016-03-12 DIAGNOSIS — I482 Chronic atrial fibrillation: Secondary | ICD-10-CM | POA: Diagnosis not present

## 2016-03-12 DIAGNOSIS — I130 Hypertensive heart and chronic kidney disease with heart failure and stage 1 through stage 4 chronic kidney disease, or unspecified chronic kidney disease: Secondary | ICD-10-CM | POA: Diagnosis not present

## 2016-03-12 DIAGNOSIS — E1151 Type 2 diabetes mellitus with diabetic peripheral angiopathy without gangrene: Secondary | ICD-10-CM | POA: Diagnosis not present

## 2016-03-12 DIAGNOSIS — N183 Chronic kidney disease, stage 3 (moderate): Secondary | ICD-10-CM | POA: Diagnosis not present

## 2016-03-12 DIAGNOSIS — T84020D Dislocation of internal right hip prosthesis, subsequent encounter: Secondary | ICD-10-CM | POA: Diagnosis not present

## 2016-03-13 ENCOUNTER — Telehealth: Payer: Self-pay | Admitting: Internal Medicine

## 2016-03-13 ENCOUNTER — Other Ambulatory Visit: Payer: Self-pay | Admitting: *Deleted

## 2016-03-13 MED ORDER — APIXABAN 5 MG PO TABS: 5.0000 mg | ORAL_TABLET | Freq: Two times a day (BID) | ORAL | 3 refills | Status: DC

## 2016-03-13 NOTE — Telephone Encounter (Signed)
*  STAT* If patient is at the pharmacy, call can be transferred to refill team.   1. Which medications need to be refilled? (please list name of each medication and dose if known) apixaban (ELIQUIS) 5 MG TABS tablet x2 day  2. Which pharmacy/location (including street and city if local pharmacy) is medication to be sent to? [facility]  3. Do they need a 30 day or 90 day supply? 30 day

## 2016-03-13 NOTE — Telephone Encounter (Signed)
Eliquis 5 mg tablet #60 R#3.

## 2016-03-24 ENCOUNTER — Other Ambulatory Visit: Payer: Self-pay | Admitting: Internal Medicine

## 2016-04-02 ENCOUNTER — Other Ambulatory Visit: Payer: Self-pay

## 2016-04-02 DIAGNOSIS — Z471 Aftercare following joint replacement surgery: Secondary | ICD-10-CM | POA: Diagnosis not present

## 2016-04-02 DIAGNOSIS — Z96641 Presence of right artificial hip joint: Secondary | ICD-10-CM | POA: Diagnosis not present

## 2016-04-02 NOTE — Telephone Encounter (Signed)
V/M left requesting rx for hydrocodone apap(last printed# 60 on 02/07/16) and lorazepam(listed on current med list as historical). Last seen 01/22/16.

## 2016-04-03 MED ORDER — LORAZEPAM 0.5 MG PO TABS: 0.5000 mg | ORAL_TABLET | Freq: Two times a day (BID) | ORAL | 0 refills | Status: DC | PRN

## 2016-04-03 MED ORDER — HYDROCODONE-ACETAMINOPHEN 7.5-325 MG PO TABS: 1.0000 | ORAL_TABLET | ORAL | 0 refills | Status: DC | PRN

## 2016-04-03 NOTE — Telephone Encounter (Signed)
RX for [employer] and signed. Ok to phone in [place]

## 2016-05-21 LAB — CUP PACEART INCLINIC DEVICE CHECK
MDC IDC PG IMPLANT DT: 20130529
MDC IDC SESS DTM: 20180322114207

## 2016-06-02 ENCOUNTER — Other Ambulatory Visit: Payer: Self-pay

## 2016-06-02 MED ORDER — ISOSORBIDE MONONITRATE ER 120 MG PO TB24: 120.0000 mg | ORAL_TABLET | Freq: Every day | ORAL | 0 refills | Status: DC

## 2016-06-02 NOTE — Telephone Encounter (Signed)
V/m left with no name or contact # was left requesting refill imdur to [facility].pt seen 12/16/15 and to continue taking imdur. CPX scheduled with Avie Echevaria NP on 06/15/16. Refill done per protocol.

## 2016-06-15 ENCOUNTER — Ambulatory Visit (INDEPENDENT_AMBULATORY_CARE_PROVIDER_SITE_OTHER): Payer: Medicare Other | Admitting: Internal Medicine

## 2016-06-15 ENCOUNTER — Encounter: Payer: Self-pay | Admitting: Internal Medicine

## 2016-06-15 VITALS — BP 144/74 | HR 66 | Temp 98.0°F | Ht 65.0 in | Wt 154.0 lb

## 2016-06-15 DIAGNOSIS — M169 Osteoarthritis of hip, unspecified: Secondary | ICD-10-CM | POA: Diagnosis not present

## 2016-06-15 DIAGNOSIS — Z23 Encounter for immunization: Secondary | ICD-10-CM

## 2016-06-15 DIAGNOSIS — E119 Type 2 diabetes mellitus without complications: Secondary | ICD-10-CM

## 2016-06-15 DIAGNOSIS — E7800 Pure hypercholesterolemia, unspecified: Secondary | ICD-10-CM | POA: Diagnosis not present

## 2016-06-15 DIAGNOSIS — Z0001 Encounter for general adult medical examination with abnormal findings: Secondary | ICD-10-CM | POA: Diagnosis not present

## 2016-06-15 DIAGNOSIS — I5032 Chronic diastolic (congestive) heart failure: Secondary | ICD-10-CM | POA: Diagnosis not present

## 2016-06-15 DIAGNOSIS — I482 Chronic atrial fibrillation, unspecified: Secondary | ICD-10-CM

## 2016-06-15 DIAGNOSIS — Z1239 Encounter for other screening for malignant neoplasm of breast: Secondary | ICD-10-CM

## 2016-06-15 DIAGNOSIS — Z0000 Encounter for general adult medical examination without abnormal findings: Secondary | ICD-10-CM

## 2016-06-15 DIAGNOSIS — I10 Essential (primary) hypertension: Secondary | ICD-10-CM | POA: Diagnosis not present

## 2016-06-15 DIAGNOSIS — K219 Gastro-esophageal reflux disease without esophagitis: Secondary | ICD-10-CM

## 2016-06-15 DIAGNOSIS — N183 Chronic kidney disease, stage 3 unspecified: Secondary | ICD-10-CM

## 2016-06-15 DIAGNOSIS — F419 Anxiety disorder, unspecified: Secondary | ICD-10-CM

## 2016-06-15 DIAGNOSIS — J449 Chronic obstructive pulmonary disease, unspecified: Secondary | ICD-10-CM | POA: Diagnosis not present

## 2016-06-15 DIAGNOSIS — F329 Major depressive disorder, single episode, unspecified: Secondary | ICD-10-CM

## 2016-06-15 DIAGNOSIS — N3945 Continuous leakage: Secondary | ICD-10-CM

## 2016-06-15 DIAGNOSIS — Z13820 Encounter for screening for osteoporosis: Secondary | ICD-10-CM

## 2016-06-15 DIAGNOSIS — I25810 Atherosclerosis of coronary artery bypass graft(s) without angina pectoris: Secondary | ICD-10-CM

## 2016-06-15 DIAGNOSIS — Z780 Asymptomatic menopausal state: Secondary | ICD-10-CM

## 2016-06-15 MED ORDER — HYDROCODONE-ACETAMINOPHEN 7.5-325 MG PO TABS: 1.0000 | ORAL_TABLET | ORAL | 0 refills | Status: DC | PRN

## 2016-06-15 MED ORDER — TRIAMCINOLONE ACETONIDE 0.1 % EX CREA: 1.0000 "application " | TOPICAL_CREAM | Freq: Two times a day (BID) | CUTANEOUS | 0 refills | Status: DC

## 2016-06-15 MED ORDER — OXYBUTYNIN CHLORIDE ER 5 MG PO TB24: 5.0000 mg | ORAL_TABLET | Freq: Every day | ORAL | 5 refills | Status: DC

## 2016-06-15 NOTE — Patient Instructions (Signed)
Health Maintenance for Postmenopausal Women Menopause is a normal process in which your reproductive ability comes to an end. This process happens gradually over a span of months to years, usually between the ages of 33 and 38. Menopause is complete when you have missed 12 consecutive menstrual periods. It is important to talk with your health care provider about some of the most common conditions that affect postmenopausal women, such as heart disease, cancer, and bone loss (osteoporosis). Adopting a healthy lifestyle and getting preventive care can help to promote your health and wellness. Those actions can also lower your chances of developing some of these common conditions. What should I know about menopause? During menopause, you may experience a number of symptoms, such as:  Moderate-to-severe hot flashes.  Night sweats.  Decrease in sex drive.  Mood swings.  Headaches.  Tiredness.  Irritability.  Memory problems.  Insomnia. Choosing to treat or not to treat menopausal changes is an individual decision that you make with your health care provider. What should I know about hormone replacement therapy and supplements? Hormone therapy products are effective for treating symptoms that are associated with menopause, such as hot flashes and night sweats. Hormone replacement carries certain risks, especially as you become older. If you are thinking about using estrogen or estrogen with progestin treatments, discuss the benefits and risks with your health care provider. What should I know about heart disease and stroke? Heart disease, heart attack, and stroke become more likely as you age. This may be due, in part, to the hormonal changes that your body experiences during menopause. These can affect how your body processes dietary fats, triglycerides, and cholesterol. Heart attack and stroke are both medical emergencies. There are many things that you can do to help prevent heart disease  and stroke:  Have your blood pressure checked at least every 1-2 years. High blood pressure causes heart disease and increases the risk of stroke.  If you are 48-61 years old, ask your health care provider if you should take aspirin to prevent a heart attack or a stroke.  Do not use any tobacco products, including cigarettes, chewing tobacco, or electronic cigarettes. If you need help quitting, ask your health care provider.  It is important to eat a healthy diet and maintain a healthy weight.  Be sure to include plenty of vegetables, fruits, low-fat dairy products, and lean protein.  Avoid eating foods that are high in solid fats, added sugars, or salt (sodium).  Get regular exercise. This is one of the most important things that you can do for your health.  Try to exercise for at least 150 minutes each week. The type of exercise that you do should increase your heart rate and make you sweat. This is known as moderate-intensity exercise.  Try to do strengthening exercises at least twice each week. Do these in addition to the moderate-intensity exercise.  Know your numbers.Ask your health care provider to check your cholesterol and your blood glucose. Continue to have your blood tested as directed by your health care provider. What should I know about cancer screening? There are several types of cancer. Take the following steps to reduce your risk and to catch any cancer development as early as possible. Breast Cancer  Practice breast self-awareness.  This means understanding how your breasts normally appear and feel.  It also means doing regular breast self-exams. Let your health care provider know about any changes, no matter how small.  If you are 40 or older,  have a clinician do a breast exam (clinical breast exam or CBE) every year. Depending on your age, family history, and medical history, it may be recommended that you also have a yearly breast X-ray (mammogram).  If you  have a family history of breast cancer, talk with your health care provider about genetic screening.  If you are at high risk for breast cancer, talk with your health care provider about having an MRI and a mammogram every year.  Breast cancer (BRCA) gene test is recommended for women who have family members with BRCA-related cancers. Results of the assessment will determine the need for genetic counseling and BRCA1 and for BRCA2 testing. BRCA-related cancers include these types:  Breast. This occurs in males or females.  Ovarian.  Tubal. This may also be called fallopian tube cancer.  Cancer of the abdominal or pelvic lining (peritoneal cancer).  Prostate.  Pancreatic. Cervical, Uterine, and Ovarian Cancer  Your health care provider may recommend that you be screened regularly for cancer of the pelvic organs. These include your ovaries, uterus, and vagina. This screening involves a pelvic exam, which includes checking for microscopic changes to the surface of your cervix (Pap test).  For women ages 21-65, health care providers may recommend a pelvic exam and a Pap test every three years. For women ages 23-65, they may recommend the Pap test and pelvic exam, combined with testing for human papilloma virus (HPV), every five years. Some types of HPV increase your risk of cervical cancer. Testing for HPV may also be done on women of any age who have unclear Pap test results.  Other health care providers may not recommend any screening for nonpregnant women who are considered low risk for pelvic cancer and have no symptoms. Ask your health care provider if a screening pelvic exam is right for you.  If you have had past treatment for cervical cancer or a condition that could lead to cancer, you need Pap tests and screening for cancer for at least 20 years after your treatment. If Pap tests have been discontinued for you, your risk factors (such as having a new sexual partner) need to be reassessed  to determine if you should start having screenings again. Some women have medical problems that increase the chance of getting cervical cancer. In these cases, your health care provider may recommend that you have screening and Pap tests more often.  If you have a family history of uterine cancer or ovarian cancer, talk with your health care provider about genetic screening.  If you have vaginal bleeding after reaching menopause, tell your health care provider.  There are currently no reliable tests available to screen for ovarian cancer. Lung Cancer  Lung cancer screening is recommended for adults 99-83 years old who are at high risk for lung cancer because of a history of smoking. A yearly low-dose CT scan of the lungs is recommended if you:  Currently smoke.  Have a history of at least 30 pack-years of smoking and you currently smoke or have quit within the past 15 years. A pack-year is smoking an average of one pack of cigarettes per day for one year. Yearly screening should:  Continue until it has been 15 years since you quit.  Stop if you develop a health problem that would prevent you from having lung cancer treatment. Colorectal Cancer  This type of cancer can be detected and can often be prevented.  Routine colorectal cancer screening usually begins at age 72 and continues  through age 75.  If you have risk factors for colon cancer, your health care provider may recommend that you be screened at an earlier age.  If you have a family history of colorectal cancer, talk with your health care provider about genetic screening.  Your health care provider may also recommend using home test kits to check for hidden blood in your stool.  A small camera at the end of a tube can be used to examine your colon directly (sigmoidoscopy or colonoscopy). This is done to check for the earliest forms of colorectal cancer.  Direct examination of the colon should be repeated every 5-10 years until  age 75. However, if early forms of precancerous polyps or small growths are found or if you have a family history or genetic risk for colorectal cancer, you may need to be screened more often. Skin Cancer  Check your skin from head to toe regularly.  Monitor any moles. Be sure to tell your health care provider:  About any new moles or changes in moles, especially if there is a change in a mole's shape or color.  If you have a mole that is larger than the size of a pencil eraser.  If any of your family members has a history of skin cancer, especially at a young age, talk with your health care provider about genetic screening.  Always use sunscreen. Apply sunscreen liberally and repeatedly throughout the day.  Whenever you are outside, protect yourself by wearing long sleeves, pants, a wide-brimmed hat, and sunglasses. What should I know about osteoporosis? Osteoporosis is a condition in which bone destruction happens more quickly than new bone creation. After menopause, you may be at an increased risk for osteoporosis. To help prevent osteoporosis or the bone fractures that can happen because of osteoporosis, the following is recommended:  If you are 19-50 years old, get at least 1,000 mg of calcium and at least 600 mg of vitamin D per day.  If you are older than age 50 but younger than age 70, get at least 1,200 mg of calcium and at least 600 mg of vitamin D per day.  If you are older than age 70, get at least 1,200 mg of calcium and at least 800 mg of vitamin D per day. Smoking and excessive alcohol intake increase the risk of osteoporosis. Eat foods that are rich in calcium and vitamin D, and do weight-bearing exercises several times each week as directed by your health care provider. What should I know about how menopause affects my mental health? Depression may occur at any age, but it is more common as you become older. Common symptoms of depression include:  Low or sad  mood.  Changes in sleep patterns.  Changes in appetite or eating patterns.  Feeling an overall lack of motivation or enjoyment of activities that you previously enjoyed.  Frequent crying spells. Talk with your health care provider if you think that you are experiencing depression. What should I know about immunizations? It is important that you get and maintain your immunizations. These include:  Tetanus, diphtheria, and pertussis (Tdap) booster vaccine.  Influenza every year before the flu season begins.  Pneumonia vaccine.  Shingles vaccine. Your health care provider may also recommend other immunizations. This information is not intended to replace advice given to you by your health care provider. Make sure you discuss any questions you have with your health care provider. Document Released: 04/10/2005 Document Revised: 09/06/2015 Document Reviewed: 11/20/2014 [employer] Interactive Patient   Education  2017 [employer]  
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
## 2016-06-16 DIAGNOSIS — R32 Unspecified urinary incontinence: Secondary | ICD-10-CM | POA: Insufficient documentation

## 2016-06-16 DIAGNOSIS — M169 Osteoarthritis of hip, unspecified: Secondary | ICD-10-CM | POA: Insufficient documentation

## 2016-06-16 LAB — COMPREHENSIVE METABOLIC PANEL
ALK PHOS: 54 U/L (ref 39–117)
ALT: 15 U/L (ref 0–35)
AST: 19 U/L (ref 0–37)
Albumin: 4.2 g/dL (ref 3.5–5.2)
BILIRUBIN TOTAL: 0.3 mg/dL (ref 0.2–1.2)
BUN: 26 mg/dL — ABNORMAL HIGH (ref 6–23)
CALCIUM: 9.3 mg/dL (ref 8.4–10.5)
CO2: 28 meq/L (ref 19–32)
CREATININE: 1.48 mg/dL — AB (ref 0.40–1.20)
Chloride: 105 mEq/L (ref 96–112)
GFR: 36.19 mL/min — AB (ref >60.00)
GLUCOSE: 107 mg/dL — AB (ref 70–99)
Potassium: 4.6 mEq/L (ref 3.5–5.1)
Sodium: 139 mEq/L (ref 135–145)
TOTAL PROTEIN: 6.8 g/dL (ref 6.0–8.3)

## 2016-06-16 LAB — CBC
HCT: 38.1 % (ref 36.0–46.0)
HEMOGLOBIN: 13 g/dL (ref 12.0–15.0)
MCHC: 34 g/dL (ref 30.0–36.0)
MCV: 93.7 fl (ref 78.0–100.0)
PLATELETS: 244 10*3/uL (ref 150.0–400.0)
RBC: 4.07 Mil/uL (ref 3.87–5.11)
RDW: 14.1 % (ref 11.5–15.5)
WBC: 11.3 10*3/uL — AB (ref 4.0–10.5)

## 2016-06-16 LAB — LIPID PANEL
Cholesterol: 147 mg/dL (ref 0–200)
HDL: 30.3 mg/dL — AB (ref >39.00)
Total CHOL/HDL Ratio: 5

## 2016-06-16 LAB — LDL CHOLESTEROL, DIRECT: LDL DIRECT: 51 mg/dL

## 2016-06-16 LAB — HEMOGLOBIN A1C: Hgb A1c MFr Bld: 6.1 % (ref 4.6–6.5)

## 2016-06-16 NOTE — Assessment & Plan Note (Signed)
CMET and Lipid profile today Encouraged her to consume a low fat diet Continue Lipitor for now

## 2016-06-16 NOTE — Assessment & Plan Note (Signed)
Mildly elevated today Continue Amlodipine and Monopril Will monitor CMET today

## 2016-06-16 NOTE — Assessment & Plan Note (Signed)
Encouraged smoking cessation-she declines at this time Will monitor

## 2016-06-16 NOTE — Assessment & Plan Note (Signed)
s/p THR x 4 She will continue to follow with Dr. Alvan Dame

## 2016-06-16 NOTE — Assessment & Plan Note (Signed)
Euvolemic on exam Continue Amlodipine, Monopril, Metoprolol and HCTZ as prescribed CMET today

## 2016-06-16 NOTE — Assessment & Plan Note (Signed)
No angina She will continue to consume a low fat diet Continue Lipitor, Metoprolol and Eliquis She will continue to follow with Dr. Glena Norfolk and Lipid profile today

## 2016-06-16 NOTE — Assessment & Plan Note (Signed)
Diet controlled Encouraged her to consume a low carb, low fat diet A1C and Lipid profile today Microalbumin not needed secondary to ACEI therapy Foot exam today Advised her to make an appt for a diabetic eye exam- daughter will schedule this Flu and pneumovax UTD, prevnar given today

## 2016-06-16 NOTE — Assessment & Plan Note (Signed)
Intermittent Continue Tums prn Will monitor

## 2016-06-16 NOTE — Assessment & Plan Note (Signed)
Chronic but stable Continue Ativan as prescribed NCCSRS reviewed- no other prescribers, will follow UDS

## 2016-06-16 NOTE — Assessment & Plan Note (Signed)
Has pacemaker Continue Metoprolol and Eliquis She will continue to follow with Dr. Fletcher Anon CBC today

## 2016-06-16 NOTE — Assessment & Plan Note (Signed)
Will try Ditropan

## 2016-06-16 NOTE — Assessment & Plan Note (Signed)
Continue Monopril CMET today

## 2016-06-23 ENCOUNTER — Ambulatory Visit (INDEPENDENT_AMBULATORY_CARE_PROVIDER_SITE_OTHER): Payer: Medicare Other | Admitting: Internal Medicine

## 2016-06-23 ENCOUNTER — Encounter: Payer: Medicare Other | Admitting: Internal Medicine

## 2016-06-23 VITALS — BP 180/80 | HR 73 | Ht 65.0 in | Wt 154.0 lb

## 2016-06-23 DIAGNOSIS — I25810 Atherosclerosis of coronary artery bypass graft(s) without angina pectoris: Secondary | ICD-10-CM

## 2016-06-23 DIAGNOSIS — I481 Persistent atrial fibrillation: Secondary | ICD-10-CM

## 2016-06-23 DIAGNOSIS — R001 Bradycardia, unspecified: Secondary | ICD-10-CM | POA: Diagnosis not present

## 2016-06-23 DIAGNOSIS — Z950 Presence of cardiac pacemaker: Secondary | ICD-10-CM

## 2016-06-23 DIAGNOSIS — I4819 Other persistent atrial fibrillation: Secondary | ICD-10-CM

## 2016-06-23 MED ORDER — HYDROCHLOROTHIAZIDE 12.5 MG PO CAPS: 12.5000 mg | ORAL_CAPSULE | Freq: Every day | ORAL | 3 refills | Status: DC

## 2016-06-23 NOTE — Patient Instructions (Signed)
Medication Instructions: - Your physician has recommended you make the following change in your medication:  1) Start hydrochlorothiazide (HCTZ) 12.5 mg- take one tablet by mouth once daily  Labwork: - Your physician recommends that you return for lab work in: 2 weeks (BMP)  Procedures/Testing: - none ordered  Follow-Up: - Remote monitoring is used to monitor your Pacemaker of ICD from home. This monitoring reduces the number of office visits required to check your device to one time per year. It allows [place] to keep an eye on the functioning of your device to ensure it is working properly. You are scheduled for a device check from home on 09/22/16. You may send your transmission at any time that day. If you have a wireless device, the transmission will be sent automatically. After your physician reviews your transmission, you will receive a postcard with your next transmission date.  - Your physician wants you to follow-up in: 6 months with Dr. Caryl Comes. You will receive a reminder letter in the mail two months in advance. If you don't receive a letter, please call our office to schedule the follow-up appointment.    Any Additional Special Instructions Will Be Listed Below (If Applicable).     If you need a refill on your cardiac medications before your next appointment, please call your pharmacy.
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
## 2016-06-27 ENCOUNTER — Emergency Department: Payer: Medicare Other

## 2016-06-27 ENCOUNTER — Inpatient Hospital Stay
Admission: EM | Admit: 2016-06-27 | Discharge: 2016-06-28 | DRG: 871 | Disposition: A | Discharge status: Home / Self Care | Payer: Medicare Other | Attending: Internal Medicine | Admitting: Internal Medicine

## 2016-06-27 DIAGNOSIS — J810 Acute pulmonary edema: Secondary | ICD-10-CM

## 2016-06-27 DIAGNOSIS — I2510 Atherosclerotic heart disease of native coronary artery without angina pectoris: Secondary | ICD-10-CM | POA: Diagnosis present

## 2016-06-27 DIAGNOSIS — K219 Gastro-esophageal reflux disease without esophagitis: Secondary | ICD-10-CM | POA: Diagnosis present

## 2016-06-27 DIAGNOSIS — J9602 Acute respiratory failure with hypercapnia: Secondary | ICD-10-CM | POA: Diagnosis present

## 2016-06-27 DIAGNOSIS — J9601 Acute respiratory failure with hypoxia: Secondary | ICD-10-CM | POA: Diagnosis present

## 2016-06-27 DIAGNOSIS — Z8249 Family history of ischemic heart disease and other diseases of the circulatory system: Secondary | ICD-10-CM | POA: Diagnosis not present

## 2016-06-27 DIAGNOSIS — F17210 Nicotine dependence, cigarettes, uncomplicated: Secondary | ICD-10-CM | POA: Diagnosis present

## 2016-06-27 DIAGNOSIS — I482 Chronic atrial fibrillation: Secondary | ICD-10-CM | POA: Diagnosis present

## 2016-06-27 DIAGNOSIS — Z841 Family history of disorders of kidney and ureter: Secondary | ICD-10-CM

## 2016-06-27 DIAGNOSIS — Z7901 Long term (current) use of anticoagulants: Secondary | ICD-10-CM

## 2016-06-27 DIAGNOSIS — I130 Hypertensive heart and chronic kidney disease with heart failure and stage 1 through stage 4 chronic kidney disease, or unspecified chronic kidney disease: Secondary | ICD-10-CM | POA: Diagnosis present

## 2016-06-27 DIAGNOSIS — Z823 Family history of stroke: Secondary | ICD-10-CM

## 2016-06-27 DIAGNOSIS — Z96641 Presence of right artificial hip joint: Secondary | ICD-10-CM | POA: Diagnosis present

## 2016-06-27 DIAGNOSIS — Z950 Presence of cardiac pacemaker: Secondary | ICD-10-CM | POA: Diagnosis not present

## 2016-06-27 DIAGNOSIS — Z79899 Other long term (current) drug therapy: Secondary | ICD-10-CM | POA: Diagnosis not present

## 2016-06-27 DIAGNOSIS — E1122 Type 2 diabetes mellitus with diabetic chronic kidney disease: Secondary | ICD-10-CM | POA: Diagnosis present

## 2016-06-27 DIAGNOSIS — J189 Pneumonia, unspecified organism: Secondary | ICD-10-CM | POA: Diagnosis present

## 2016-06-27 DIAGNOSIS — J440 Chronic obstructive pulmonary disease with acute lower respiratory infection: Secondary | ICD-10-CM | POA: Diagnosis present

## 2016-06-27 DIAGNOSIS — N183 Chronic kidney disease, stage 3 (moderate): Secondary | ICD-10-CM | POA: Diagnosis present

## 2016-06-27 DIAGNOSIS — I5033 Acute on chronic diastolic (congestive) heart failure: Secondary | ICD-10-CM | POA: Diagnosis present

## 2016-06-27 DIAGNOSIS — Z951 Presence of aortocoronary bypass graft: Secondary | ICD-10-CM | POA: Diagnosis not present

## 2016-06-27 DIAGNOSIS — J811 Chronic pulmonary edema: Secondary | ICD-10-CM | POA: Diagnosis not present

## 2016-06-27 DIAGNOSIS — Z833 Family history of diabetes mellitus: Secondary | ICD-10-CM | POA: Diagnosis not present

## 2016-06-27 DIAGNOSIS — J9600 Acute respiratory failure, unspecified whether with hypoxia or hypercapnia: Secondary | ICD-10-CM | POA: Diagnosis not present

## 2016-06-27 DIAGNOSIS — R0602 Shortness of breath: Secondary | ICD-10-CM | POA: Diagnosis not present

## 2016-06-27 DIAGNOSIS — A419 Sepsis, unspecified organism: Principal | ICD-10-CM | POA: Diagnosis present

## 2016-06-27 DIAGNOSIS — J181 Lobar pneumonia, unspecified organism: Secondary | ICD-10-CM

## 2016-06-27 DIAGNOSIS — Z800 Family history of malignant neoplasm of digestive organs: Secondary | ICD-10-CM

## 2016-06-27 DIAGNOSIS — I509 Heart failure, unspecified: Secondary | ICD-10-CM | POA: Diagnosis not present

## 2016-06-27 DIAGNOSIS — J449 Chronic obstructive pulmonary disease, unspecified: Secondary | ICD-10-CM | POA: Diagnosis not present

## 2016-06-27 DIAGNOSIS — I4891 Unspecified atrial fibrillation: Secondary | ICD-10-CM | POA: Diagnosis not present

## 2016-06-27 LAB — BLOOD GAS, VENOUS
Acid-base deficit: 15 mmol/L — ABNORMAL HIGH (ref 0.0–2.0)
BICARBONATE: 16.4 mmol/L — AB (ref 20.0–28.0)
FIO2: 0.4
O2 Saturation: 75.2 %
PCO2 VEN: 62 mmHg — AB (ref 44.0–60.0)
PH VEN: 7.03 — AB (ref 7.250–7.430)
PO2 VEN: 60 mmHg — AB (ref 32.0–45.0)
Patient temperature: 37

## 2016-06-27 LAB — MRSA PCR SCREENING: MRSA BY PCR: NEGATIVE

## 2016-06-27 LAB — URINALYSIS, COMPLETE (UACMP) WITH MICROSCOPIC
Bilirubin Urine: NEGATIVE
GLUCOSE, UA: NEGATIVE mg/dL
KETONES UR: NEGATIVE mg/dL
Nitrite: NEGATIVE
PROTEIN: 100 mg/dL — AB
Specific Gravity, Urine: 1.008 (ref 1.005–1.030)
pH: 6 (ref 5.0–8.0)

## 2016-06-27 LAB — LACTIC ACID, PLASMA
LACTIC ACID, VENOUS: 3.7 mmol/L — AB (ref 0.5–1.9)
LACTIC ACID, VENOUS: 1.9 mmol/L (ref 0.5–1.9)

## 2016-06-27 LAB — CBC
HEMATOCRIT: 43 % (ref 35.0–47.0)
HEMOGLOBIN: 14.2 g/dL (ref 12.0–16.0)
MCH: 31.6 pg (ref 26.0–34.0)
MCHC: 33.1 g/dL (ref 32.0–36.0)
MCV: 95.5 fL (ref 80.0–100.0)
Platelets: 240 10*3/uL (ref 150–440)
RBC: 4.5 MIL/uL (ref 3.80–5.20)
RDW: 14.1 % (ref 11.5–14.5)
WBC: 21.8 10*3/uL — ABNORMAL HIGH (ref 3.6–11.0)

## 2016-06-27 LAB — BASIC METABOLIC PANEL
ANION GAP: 12 (ref 5–15)
BUN: 27 mg/dL — ABNORMAL HIGH (ref 6–20)
CALCIUM: 9.3 mg/dL (ref 8.9–10.3)
CHLORIDE: 107 mmol/L (ref 101–111)
CO2: 20 mmol/L — AB (ref 22–32)
Creatinine, Ser: 1.46 mg/dL — ABNORMAL HIGH (ref 0.44–1.00)
GFR calc non Af Amer: 33 mL/min — ABNORMAL LOW (ref >60)
GFR, EST AFRICAN AMERICAN: 39 mL/min — AB (ref >60)
GLUCOSE: 228 mg/dL — AB (ref 65–99)
POTASSIUM: 4.3 mmol/L (ref 3.5–5.1)
Sodium: 139 mmol/L (ref 135–145)

## 2016-06-27 LAB — BRAIN NATRIURETIC PEPTIDE: B Natriuretic Peptide: 1203 pg/mL — ABNORMAL HIGH (ref 0.0–100.0)

## 2016-06-27 LAB — TROPONIN I
TROPONIN I: 0.18 ng/mL — AB (ref <0.03)
Troponin I: 0.04 ng/mL (ref <0.03)
Troponin I: 0.14 ng/mL (ref <0.03)
Troponin I: 0.21 ng/mL (ref <0.03)

## 2016-06-27 LAB — HEPATIC FUNCTION PANEL
ALBUMIN: 4.3 g/dL (ref 3.5–5.0)
ALT: 24 U/L (ref 14–54)
AST: 33 U/L (ref 15–41)
Alkaline Phosphatase: 63 U/L (ref 38–126)
BILIRUBIN TOTAL: 0.6 mg/dL (ref 0.3–1.2)
Total Protein: 8 g/dL (ref 6.5–8.1)

## 2016-06-27 LAB — LIPASE, BLOOD: Lipase: 37 U/L (ref 11–51)

## 2016-06-27 LAB — GLUCOSE, CAPILLARY
GLUCOSE-CAPILLARY: 124 mg/dL — AB (ref 65–99)
GLUCOSE-CAPILLARY: 102 mg/dL — AB (ref 65–99)
Glucose-Capillary: 117 mg/dL — ABNORMAL HIGH (ref 65–99)

## 2016-06-27 MED ORDER — APIXABAN 5 MG PO TABS
5.0000 mg | ORAL_TABLET | Freq: Two times a day (BID) | ORAL | Status: DC
  Administered 2016-06-27 – 2016-06-28 (×3): 5 mg via ORAL
  Filled 2016-06-27 (×3): qty 1

## 2016-06-27 MED ORDER — DEXTROSE 5 % IV SOLN: 1.0000 g | Freq: Once | INTRAVENOUS | Status: DC

## 2016-06-27 MED ORDER — ATORVASTATIN CALCIUM 20 MG PO TABS
20.0000 mg | ORAL_TABLET | Freq: Every evening | ORAL | Status: DC
  Administered 2016-06-27: 20 mg via ORAL
  Filled 2016-06-27: qty 1

## 2016-06-27 MED ORDER — CEFTRIAXONE SODIUM-DEXTROSE 1-3.74 GM-% IV SOLR
1.0000 g | INTRAVENOUS | Status: DC
  Filled 2016-06-27: qty 50

## 2016-06-27 MED ORDER — OXYBUTYNIN CHLORIDE ER 5 MG PO TB24
5.0000 mg | ORAL_TABLET | Freq: Every day | ORAL | Status: DC
  Administered 2016-06-27: 5 mg via ORAL
  Filled 2016-06-27: qty 1

## 2016-06-27 MED ORDER — AMLODIPINE BESYLATE 10 MG PO TABS
10.0000 mg | ORAL_TABLET | Freq: Every day | ORAL | Status: DC
  Administered 2016-06-28: 10 mg via ORAL
  Filled 2016-06-27: qty 1

## 2016-06-27 MED ORDER — LORATADINE 10 MG PO TABS
10.0000 mg | ORAL_TABLET | Freq: Every day | ORAL | Status: DC
  Administered 2016-06-27 – 2016-06-28 (×2): 10 mg via ORAL
  Filled 2016-06-27 (×2): qty 1

## 2016-06-27 MED ORDER — PSYLLIUM 95 % PO PACK
1.0000 | PACK | Freq: Every day | ORAL | Status: DC
  Administered 2016-06-28: 1 via ORAL
  Filled 2016-06-27: qty 1

## 2016-06-27 MED ORDER — ALBUTEROL SULFATE (2.5 MG/3ML) 0.083% IN NEBU: 2.5000 mg | INHALATION_SOLUTION | Freq: Four times a day (QID) | RESPIRATORY_TRACT | Status: DC | PRN

## 2016-06-27 MED ORDER — CEFTRIAXONE SODIUM-DEXTROSE 1-3.74 GM-% IV SOLR
1.0000 g | Freq: Once | INTRAVENOUS | Status: AC
  Administered 2016-06-27: 1 g via INTRAVENOUS
  Filled 2016-06-27: qty 50

## 2016-06-27 MED ORDER — POLYETHYLENE GLYCOL 3350 17 G PO PACK: 17.0000 g | PACK | Freq: Every day | ORAL | Status: DC | PRN

## 2016-06-27 MED ORDER — DEXTROSE 5 % IV SOLN
1.0000 g | INTRAVENOUS | Status: DC
  Administered 2016-06-28: 1 g via INTRAVENOUS
  Filled 2016-06-27: qty 10

## 2016-06-27 MED ORDER — DOCUSATE SODIUM 100 MG PO CAPS: 100.0000 mg | ORAL_CAPSULE | Freq: Two times a day (BID) | ORAL | Status: DC

## 2016-06-27 MED ORDER — HYDROCODONE-ACETAMINOPHEN 7.5-325 MG PO TABS
1.0000 | ORAL_TABLET | ORAL | Status: DC | PRN
  Administered 2016-06-27: 1 via ORAL
  Filled 2016-06-27: qty 1

## 2016-06-27 MED ORDER — PSYLLIUM 0.52 G PO CAPS: 0.5200 g | ORAL_CAPSULE | Freq: Every day | ORAL | Status: DC

## 2016-06-27 MED ORDER — ACETAMINOPHEN 325 MG PO TABS: 650.0000 mg | ORAL_TABLET | Freq: Four times a day (QID) | ORAL | Status: DC | PRN

## 2016-06-27 MED ORDER — ONDANSETRON HCL 4 MG PO TABS: 4.0000 mg | ORAL_TABLET | Freq: Four times a day (QID) | ORAL | Status: DC | PRN

## 2016-06-27 MED ORDER — DOCUSATE SODIUM 100 MG PO CAPS
100.0000 mg | ORAL_CAPSULE | Freq: Two times a day (BID) | ORAL | Status: DC
  Administered 2016-06-27 – 2016-06-28 (×2): 100 mg via ORAL
  Filled 2016-06-27 (×2): qty 1

## 2016-06-27 MED ORDER — ISOSORBIDE MONONITRATE ER 60 MG PO TB24
120.0000 mg | ORAL_TABLET | Freq: Every day | ORAL | Status: DC
  Administered 2016-06-28: 120 mg via ORAL
  Filled 2016-06-27: qty 2

## 2016-06-27 MED ORDER — ACETAMINOPHEN 650 MG RE SUPP: 650.0000 mg | Freq: Four times a day (QID) | RECTAL | Status: DC | PRN

## 2016-06-27 MED ORDER — DEXTROSE 5 % IV SOLN
500.0000 mg | Freq: Once | INTRAVENOUS | Status: AC
  Administered 2016-06-27: 500 mg via INTRAVENOUS
  Filled 2016-06-27: qty 500

## 2016-06-27 MED ORDER — DEXTROSE 5 % IV SOLN
500.0000 mg | INTRAVENOUS | Status: DC
  Administered 2016-06-28: 500 mg via INTRAVENOUS
  Filled 2016-06-27: qty 500

## 2016-06-27 MED ORDER — INSULIN ASPART 100 UNIT/ML ~~LOC~~ SOLN
0.0000 [IU] | Freq: Three times a day (TID) | SUBCUTANEOUS | Status: DC
  Administered 2016-06-27: 2 [IU] via SUBCUTANEOUS
  Administered 2016-06-28: 1 [IU] via SUBCUTANEOUS
  Filled 2016-06-27: qty 2
  Filled 2016-06-27: qty 1

## 2016-06-27 MED ORDER — LORAZEPAM 0.5 MG PO TABS: 0.5000 mg | ORAL_TABLET | Freq: Two times a day (BID) | ORAL | Status: DC | PRN

## 2016-06-27 MED ORDER — FUROSEMIDE 10 MG/ML IJ SOLN
20.0000 mg | Freq: Two times a day (BID) | INTRAMUSCULAR | Status: DC
  Administered 2016-06-27 – 2016-06-28 (×2): 20 mg via INTRAVENOUS
  Filled 2016-06-27 (×2): qty 2

## 2016-06-27 MED ORDER — FUROSEMIDE 10 MG/ML IJ SOLN
40.0000 mg | Freq: Once | INTRAMUSCULAR | Status: AC
  Administered 2016-06-27: 40 mg via INTRAVENOUS
  Filled 2016-06-27: qty 4

## 2016-06-27 MED ORDER — ONDANSETRON HCL 4 MG/2ML IJ SOLN
INTRAMUSCULAR | Status: AC
  Administered 2016-06-27: 4 mg via INTRAVENOUS
  Filled 2016-06-27: qty 2

## 2016-06-27 MED ORDER — METOPROLOL SUCCINATE ER 100 MG PO TB24
100.0000 mg | ORAL_TABLET | Freq: Two times a day (BID) | ORAL | Status: DC
  Administered 2016-06-27 – 2016-06-28 (×3): 100 mg via ORAL
  Filled 2016-06-27 (×3): qty 1

## 2016-06-27 MED ORDER — ONDANSETRON HCL 4 MG/2ML IJ SOLN
4.0000 mg | Freq: Four times a day (QID) | INTRAMUSCULAR | Status: DC | PRN
  Administered 2016-06-27: 4 mg via INTRAVENOUS

## 2016-06-27 MED ORDER — VITAMIN D 1000 UNITS PO TABS
1000.0000 [IU] | ORAL_TABLET | Freq: Every day | ORAL | Status: DC
  Administered 2016-06-27 – 2016-06-28 (×2): 1000 [IU] via ORAL
  Filled 2016-06-27 (×2): qty 1

## 2016-06-27 NOTE — Progress Notes (Signed)
80 yo wf admitted to room 253 via stretcher from ED with Sepsis.  A&O x3, no distress on 3LO2 per [place].  Cardiac monitor placed on pt and verified with Candace CNA, pt denies chest pain.  Lungs diminished with scattered wheezes bil.  SL lt fa/ rt wrist flushes well.  Skin assessed with Casey Burkitt.  Oriented to room and surroundings, POC reviewed with pt and daughter.  Denies need at this time. CB in reach, SR up x 2, bed alarm on.

## 2016-06-27 NOTE — ED Notes (Signed)
Patient reported urge to urinate. Patient placed on bedpan. Pt unable to urinate. Incontinence brief placed on patient. RN will continue to monitor
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
## 2016-06-27 NOTE — ED Triage Notes (Signed)
Patient pulled from car in respiratory distress. Pt's breathing labored, with grunting and accessory muscle use. Pt's oxygen saturation at 77% on RA. MD at bedside.

## 2016-06-27 NOTE — ED Notes (Signed)
X-ray at bedside

## 2016-06-27 NOTE — ED Notes (Signed)
After RN gave report to CCU RN, RT notified RN that she took pt off Bi-pap, pt is on 4 liters [place] and is tolerating with sats holding at 96%. Pt is not in any distress.  Dr. Posey Pronto paged to see if pt can go to regular floor
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
## 2016-06-27 NOTE — ED Notes (Signed)
Pt had 1 episode of diarrhea.

## 2016-06-27 NOTE — Plan of Care (Signed)
Problem: Safety: Goal: Ability to remain free from injury will improve Outcome: Progressing Fall precautions in place, non skid socks when oob  Problem: Pain Managment: Goal: General experience of comfort will improve Outcome: Progressing Prn medications  Problem: Physical Regulation: Goal: Will remain free from infection Outcome: Not Progressing Currently on IV antibiotics  Problem: Activity: Goal: Risk for activity intolerance will decrease Outcome: Not Progressing Orders put in for PT evaluation  Problem: Fluid Volume: Goal: Hemodynamic stability will improve Outcome: Progressing No IVF indicated  Problem: Physical Regulation: Goal: Signs and symptoms of infection will decrease Outcome: Progressing On antibiotics

## 2016-06-27 NOTE — Progress Notes (Signed)
Dr. Fritzi Mandes made aware of pt's troponin of .21.  No new orders received.

## 2016-06-27 NOTE — ED Notes (Signed)
ED Provider at bedside. 

## 2016-06-28 LAB — CBC
HCT: 40.2 % (ref 35.0–47.0)
Hemoglobin: 13.7 g/dL (ref 12.0–16.0)
MCH: 31.5 pg (ref 26.0–34.0)
MCHC: 34.1 g/dL (ref 32.0–36.0)
MCV: 92.4 fL (ref 80.0–100.0)
PLATELETS: 226 10*3/uL (ref 150–440)
RBC: 4.35 MIL/uL (ref 3.80–5.20)
RDW: 14.4 % (ref 11.5–14.5)
WBC: 14.9 10*3/uL — AB (ref 3.6–11.0)

## 2016-06-28 LAB — GLUCOSE, CAPILLARY
GLUCOSE-CAPILLARY: 83 mg/dL (ref 65–99)
Glucose-Capillary: 132 mg/dL — ABNORMAL HIGH (ref 65–99)

## 2016-06-28 MED ORDER — FUROSEMIDE 20 MG PO TABS: 20.0000 mg | ORAL_TABLET | Freq: Every day | ORAL | 0 refills | Status: DC

## 2016-06-28 MED ORDER — CEFUROXIME AXETIL 500 MG PO TABS: 500.0000 mg | ORAL_TABLET | Freq: Two times a day (BID) | ORAL | Status: DC

## 2016-06-28 MED ORDER — AZITHROMYCIN 250 MG PO TABS: ORAL_TABLET | ORAL | 0 refills | Status: DC

## 2016-06-28 MED ORDER — FUROSEMIDE 20 MG PO TABS
20.0000 mg | ORAL_TABLET | Freq: Every day | ORAL | Status: DC
  Administered 2016-06-28: 20 mg via ORAL
  Filled 2016-06-28: qty 1

## 2016-06-28 MED ORDER — CEFUROXIME AXETIL 500 MG PO TABS: 500.0000 mg | ORAL_TABLET | Freq: Two times a day (BID) | ORAL | 0 refills | Status: DC

## 2016-06-28 MED ORDER — AZITHROMYCIN 250 MG PO TABS: 250.0000 mg | ORAL_TABLET | Freq: Every day | ORAL | Status: DC
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
## 2016-06-28 NOTE — Care Management Note (Signed)
Case Management Note  Patient Details  Name: Wanda Brooks MRN: [id number] Date of Birth: 1935-07-23  Subjective/Objective:      Wanda Brooks has refused offer of home health services. Dr Fritzi Mandes is aware. Daughter reports that Wanda Brooks already has a RW at home. Daughter will transport home today.               Action/Plan:   Expected Discharge Date:  06/28/16               Expected Discharge Plan:     In-House Referral:     Discharge planning Services     Post Acute Care Choice:    Choice offered to:     DME Arranged:    DME Agency:     HH Arranged:    HH Agency:     Status of Service:     If discussed at [employer], dates discussed:    Additional Comments:  Ayisha Pol A, RN 06/28/2016, 12:47 PM

## 2016-06-28 NOTE — Progress Notes (Signed)
Pt discharged to home via wc.  Instructions  given to pt.  Questions answered.  No distress.  
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
## 2016-06-28 NOTE — Plan of Care (Signed)
Problem: Education: Goal: Knowledge of [facility] Education information/materials will improve Outcome: Progressing Care plan reviewed with pt  Problem: Safety: Goal: Ability to remain free from injury will improve Outcome: Progressing Fall precautions in place, non skid socks when oob  Problem: Pain Managment: Goal: General experience of comfort will improve Outcome: Progressing Prn medications  Problem: Physical Regulation: Goal: Will remain free from infection Outcome: Not Progressing Remains on IV antibiotics  Problem: Activity: Goal: Risk for activity intolerance will decrease Outcome: Not Progressing PT evaluation today  Problem: Fluid Volume: Goal: Hemodynamic stability will improve Outcome: Progressing No IVF indicated  Problem: Physical Regulation: Goal: Diagnostic test results will improve Outcome: Progressing Lactic acid down to 1.9

## 2016-06-30 ENCOUNTER — Other Ambulatory Visit: Payer: Self-pay | Admitting: Internal Medicine

## 2016-06-30 ENCOUNTER — Encounter: Payer: Self-pay | Admitting: Internal Medicine

## 2016-06-30 ENCOUNTER — Ambulatory Visit (INDEPENDENT_AMBULATORY_CARE_PROVIDER_SITE_OTHER): Payer: Medicare Other | Admitting: Internal Medicine

## 2016-06-30 VITALS — BP 148/88 | HR 84 | Temp 97.8°F | Wt 148.8 lb

## 2016-06-30 DIAGNOSIS — I5043 Acute on chronic combined systolic (congestive) and diastolic (congestive) heart failure: Secondary | ICD-10-CM | POA: Diagnosis not present

## 2016-06-30 DIAGNOSIS — J189 Pneumonia, unspecified organism: Secondary | ICD-10-CM | POA: Diagnosis not present

## 2016-06-30 LAB — BASIC METABOLIC PANEL
BUN: 54 mg/dL — ABNORMAL HIGH (ref 6–23)
CO2: 27 mEq/L (ref 19–32)
Calcium: 9.8 mg/dL (ref 8.4–10.5)
Chloride: 105 mEq/L (ref 96–112)
Creatinine, Ser: 1.84 mg/dL — ABNORMAL HIGH (ref 0.40–1.20)
GFR: 28.15 mL/min — AB (ref >60.00)
GLUCOSE: 176 mg/dL — AB (ref 70–99)
Potassium: 3.9 mEq/L (ref 3.5–5.1)
SODIUM: 141 meq/L (ref 135–145)

## 2016-06-30 LAB — BRAIN NATRIURETIC PEPTIDE: Pro B Natriuretic peptide (BNP): 139 pg/mL — ABNORMAL HIGH (ref 0.0–100.0)

## 2016-06-30 LAB — CBC
HEMATOCRIT: 41.3 % (ref 36.0–46.0)
HEMOGLOBIN: 13.9 g/dL (ref 12.0–15.0)
MCHC: 33.5 g/dL (ref 30.0–36.0)
MCV: 93.1 fl (ref 78.0–100.0)
Platelets: 254 10*3/uL (ref 150.0–400.0)
RBC: 4.44 Mil/uL (ref 3.87–5.11)
RDW: 14.2 % (ref 11.5–15.5)
WBC: 10.1 10*3/uL (ref 4.0–10.5)

## 2016-06-30 NOTE — Patient Instructions (Signed)
Heart Failure °Heart failure means your heart has trouble pumping blood. This makes it hard for your body to work well. Heart failure is usually a long-term (chronic) condition. You must take good care of yourself and follow your doctor's treatment plan. °Follow these instructions at home: °· Take your heart medicine as told by your doctor. °? Do not stop taking medicine unless your doctor tells you to. °? Do not skip any dose of medicine. °? Refill your medicines before they run out. °? Take other medicines only as told by your doctor or pharmacist. °· Stay active if told by your doctor. The elderly and people with severe heart failure should talk with a doctor about physical activity. °· Eat heart-healthy foods. Choose foods that are without trans fat and are low in saturated fat, cholesterol, and salt (sodium). This includes fresh or frozen fruits and vegetables, fish, lean meats, fat-free or low-fat dairy foods, whole grains, and high-fiber foods. Lentils and dried peas and beans (legumes) are also good choices. °· Limit salt if told by your doctor. °· Cook in a healthy way. Roast, grill, broil, bake, poach, steam, or stir-fry foods. °· Limit fluids as told by your doctor. °· Weigh yourself every morning. Do this after you pee (urinate) and before you eat breakfast. Write down your weight to give to your doctor. °· Take your blood pressure and write it down if your doctor tells you to. °· Ask your doctor how to check your pulse. Check your pulse as told. °· Lose weight if told by your doctor. °· Stop smoking or chewing tobacco. Do not use gum or patches that help you quit without your doctor's approval. °· Schedule and go to doctor visits as told. °· Nonpregnant women should have no more than 1 drink a day. Men should have no more than 2 drinks a day. Talk to your doctor about drinking alcohol. °· Stop illegal drug use. °· Stay current with shots (immunizations). °· Manage your health conditions as told by your  doctor. °· Learn to manage your stress. °· Rest when you are tired. °· If it is really hot outside: °? Avoid intense activities. °? Use air conditioning or fans, or get in a cooler place. °? Avoid caffeine and alcohol. °? Wear loose-fitting, lightweight, and light-colored clothing. °· If it is really cold outside: °? Avoid intense activities. °? Layer your clothing. °? Wear mittens or gloves, a hat, and a scarf when going outside. °? Avoid alcohol. °· Learn about heart failure and get support as needed. °· Get help to maintain or improve your quality of life and your ability to care for yourself as needed. °Contact a doctor if: °· You gain weight quickly. °· You are more short of breath than usual. °· You cannot do your normal activities. °· You tire easily. °· You cough more than normal, especially with activity. °· You have any or more puffiness (swelling) in areas such as your hands, feet, ankles, or belly (abdomen). °· You cannot sleep because it is hard to breathe. °· You feel like your heart is beating fast (palpitations). °· You get dizzy or light-headed when you stand up. °Get help right away if: °· You have trouble breathing. °· There is a change in mental status, such as becoming less alert or not being able to focus. °· You have chest pain or discomfort. °· You faint. °This information is not intended to replace advice given to you by your health care provider. Make sure you   discuss any questions you have with your health care provider. °Document Released: 11/26/2007 Document Revised: 07/25/2015 Document Reviewed: 04/04/2012 °Elsevier Interactive Patient Education © 2017 Elsevier Inc. ° °
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
## 2016-07-02 LAB — CULTURE, BLOOD (ROUTINE X 2)
CULTURE: NO GROWTH
CULTURE: NO GROWTH
SPECIAL REQUESTS: ADEQUATE

## 2016-07-06 ENCOUNTER — Other Ambulatory Visit: Payer: Self-pay | Admitting: Internal Medicine

## 2016-07-06 NOTE — Telephone Encounter (Signed)
This needs to go to Dr. Caryl Comes

## 2016-07-07 ENCOUNTER — Other Ambulatory Visit: Payer: Medicare Other

## 2016-07-20 ENCOUNTER — Telehealth: Payer: Self-pay

## 2016-07-20 MED ORDER — HYDROCODONE-ACETAMINOPHEN 7.5-325 MG PO TABS: 1.0000 | ORAL_TABLET | ORAL | 0 refills | Status: DC | PRN

## 2016-07-20 NOTE — Telephone Encounter (Signed)
Pt left v/m requesting rx hydrocodone apap. Call when ready for pick up. Last printed # 60 on 06/15/16; last seen 06/30/16. Also at cb pts daughter wants to know why isosorbide was not filled earlier this month. (appears sent to Dr Caryl Comes.)

## 2016-07-20 NOTE — Telephone Encounter (Signed)
Cardiology should be filling Isosorbide. RX for [employer] and signed and placed in MYD box

## 2016-07-21 ENCOUNTER — Other Ambulatory Visit: Payer: Self-pay | Admitting: Internal Medicine

## 2016-07-21 NOTE — Telephone Encounter (Signed)
Daughter aware.

## 2016-07-21 NOTE — Telephone Encounter (Signed)
Called daughter [name]... No answer and unable to lmovm... Will try again later... Rx for isosorbide is to be filled through cardiology and Rx left in front office for pick up

## 2016-07-21 NOTE — Telephone Encounter (Signed)
Pt last saw Dr Caryl Comes on 06/23/16, last labs 06/30/16 Creat 1.84, age 81, weight 67.5kg, based on specified criteria Eliquis 5mg  BID is appropriate dosage.  Will refill rx.

## 2016-08-06 ENCOUNTER — Other Ambulatory Visit: Payer: Self-pay | Admitting: *Deleted

## 2016-08-06 NOTE — Telephone Encounter (Signed)
Pts daughter left vm stating pts drug plan has changed and she is required to change pharmacies. Last amlodipine Rx 2016 and was prescribed by pts previous dr. Derek Jack advised RBaity out of office, but states it can await her return as she has meds left. pls advise

## 2016-08-07 MED ORDER — ISOSORBIDE MONONITRATE ER 120 MG PO TB24: 120.0000 mg | ORAL_TABLET | Freq: Every day | ORAL | 1 refills | Status: DC

## 2016-08-07 MED ORDER — AMLODIPINE BESYLATE 10 MG PO TABS: 10.0000 mg | ORAL_TABLET | Freq: Every day | ORAL | 1 refills | Status: DC

## 2016-08-11 ENCOUNTER — Encounter: Payer: Medicare Other | Admitting: Internal Medicine

## 2016-08-26 ENCOUNTER — Other Ambulatory Visit: Payer: Self-pay

## 2016-08-26 NOTE — Telephone Encounter (Signed)
Wanda Brooks pt's daughter left VM requesting the following meds.  [facility] in [place]   Norco was last filled on 07/20/16 #60 ATIVAN 04/03/16 #60  Last OV 06/30/16

## 2016-08-27 MED ORDER — LORAZEPAM 0.5 MG PO TABS: 0.5000 mg | ORAL_TABLET | Freq: Two times a day (BID) | ORAL | 0 refills | Status: DC | PRN

## 2016-08-27 MED ORDER — ATORVASTATIN CALCIUM 20 MG PO TABS: 20.0000 mg | ORAL_TABLET | Freq: Every evening | ORAL | 6 refills | Status: DC

## 2016-08-27 MED ORDER — HYDROCODONE-ACETAMINOPHEN 7.5-325 MG PO TABS
1.0000 | ORAL_TABLET | ORAL | 0 refills | Status: DC | PRN
Start: 2016-08-27 — End: 2016-10-15

## 2016-08-27 NOTE — Telephone Encounter (Signed)
lipitor sent electronically Ok to phone in [place] for [employer] and signed and placed in MYD box

## 2016-08-28 LAB — CUP PACEART INCLINIC DEVICE CHECK
Battery Impedance: 1199 Ohm
Battery Voltage: 2.76 V
Brady Statistic AP VP Percent: 99 %
Brady Statistic AP VS Percent: 0 %
Brady Statistic AS VP Percent: 1 %
Brady Statistic AS VS Percent: 0 %
Implantable Lead Implant Date: 20130529
Implantable Lead Location: 753860
Implantable Lead Model: 4074
Implantable Lead Model: 4574
Lead Channel Impedance Value: 409 Ohm
Lead Channel Impedance Value: 423 Ohm
Lead Channel Pacing Threshold Amplitude: 0.25 V
Lead Channel Sensing Intrinsic Amplitude: 0.7 mV
Lead Channel Sensing Intrinsic Amplitude: 8 mV
MDC IDC LEAD IMPLANT DT: 20130529
MDC IDC LEAD LOCATION: 753859
MDC IDC MSMT BATTERY REMAINING LONGEVITY: 34 mo
MDC IDC MSMT LEADCHNL RA PACING THRESHOLD PULSEWIDTH: 0.4 ms
MDC IDC MSMT LEADCHNL RV PACING THRESHOLD AMPLITUDE: 1.5 V
MDC IDC MSMT LEADCHNL RV PACING THRESHOLD PULSEWIDTH: 1.25 ms
MDC IDC PG IMPLANT DT: 20130529
MDC IDC SESS DTM: 20180424155945
MDC IDC SET LEADCHNL RA PACING AMPLITUDE: 1.5 V
MDC IDC SET LEADCHNL RV PACING AMPLITUDE: 2.5 V
MDC IDC SET LEADCHNL RV PACING PULSEWIDTH: 1.25 ms
MDC IDC SET LEADCHNL RV SENSING SENSITIVITY: 2 mV

## 2016-08-28 NOTE — Telephone Encounter (Signed)
Rx left in front office for pick up and daughter is aware Rx called into pharmacy

## 2016-09-21 ENCOUNTER — Telehealth: Payer: Self-pay

## 2016-09-21 NOTE — Telephone Encounter (Signed)
Daughter called saying she has a new pharmacy and has no refills. She needs refills on metoprolol, HCTZ, and isosorbide, atorvastatin sent to [facility].

## 2016-09-22 ENCOUNTER — Ambulatory Visit (INDEPENDENT_AMBULATORY_CARE_PROVIDER_SITE_OTHER): Payer: Medicare Other | Admitting: *Deleted

## 2016-09-22 ENCOUNTER — Telehealth: Payer: Self-pay | Admitting: Cardiology

## 2016-09-22 DIAGNOSIS — R001 Bradycardia, unspecified: Secondary | ICD-10-CM

## 2016-09-22 MED ORDER — ATORVASTATIN CALCIUM 20 MG PO TABS: 20.0000 mg | ORAL_TABLET | Freq: Every evening | ORAL | 2 refills | Status: DC

## 2016-09-22 MED ORDER — ISOSORBIDE MONONITRATE ER 120 MG PO TB24: 120.0000 mg | ORAL_TABLET | Freq: Every day | ORAL | 2 refills | Status: DC

## 2016-09-22 MED ORDER — HYDROCHLOROTHIAZIDE 12.5 MG PO CAPS: 12.5000 mg | ORAL_CAPSULE | Freq: Every day | ORAL | 0 refills | Status: DC

## 2016-09-22 MED ORDER — METOPROLOL SUCCINATE ER 100 MG PO TB24: 100.0000 mg | ORAL_TABLET | Freq: Two times a day (BID) | ORAL | 2 refills | Status: DC

## 2016-09-22 NOTE — Telephone Encounter (Signed)
Rx sent through e-scribe  

## 2016-09-22 NOTE — Telephone Encounter (Signed)
Spoke with pt and reminded pt of remote transmission that is due today. Pt verbalized understanding.   

## 2016-09-23 NOTE — Progress Notes (Signed)
Remote pacemaker transmission.   

## 2016-09-24 ENCOUNTER — Encounter: Payer: Self-pay | Admitting: Cardiology

## 2016-09-25 DIAGNOSIS — M48061 Spinal stenosis, lumbar region without neurogenic claudication: Secondary | ICD-10-CM | POA: Diagnosis not present

## 2016-09-25 DIAGNOSIS — M79642 Pain in left hand: Secondary | ICD-10-CM | POA: Diagnosis not present

## 2016-09-25 DIAGNOSIS — Z471 Aftercare following joint replacement surgery: Secondary | ICD-10-CM | POA: Diagnosis not present

## 2016-09-25 DIAGNOSIS — Z96641 Presence of right artificial hip joint: Secondary | ICD-10-CM | POA: Diagnosis not present

## 2016-09-25 DIAGNOSIS — S62357A Nondisplaced fracture of shaft of fifth metacarpal bone, left hand, initial encounter for closed fracture: Secondary | ICD-10-CM | POA: Diagnosis not present

## 2016-10-09 ENCOUNTER — Emergency Department: Payer: Medicare Other

## 2016-10-09 ENCOUNTER — Observation Stay
Admission: EM | Admit: 2016-10-09 | Discharge: 2016-10-12 | Disposition: A | Discharge status: Nursing Home (Includes ICF) | Payer: Medicare Other | Attending: Internal Medicine | Admitting: Internal Medicine

## 2016-10-09 ENCOUNTER — Encounter: Payer: Self-pay | Admitting: Emergency Medicine

## 2016-10-09 ENCOUNTER — Other Ambulatory Visit: Payer: Self-pay

## 2016-10-09 DIAGNOSIS — R059 Cough, unspecified: Secondary | ICD-10-CM

## 2016-10-09 DIAGNOSIS — E785 Hyperlipidemia, unspecified: Secondary | ICD-10-CM | POA: Diagnosis not present

## 2016-10-09 DIAGNOSIS — W19XXXA Unspecified fall, initial encounter: Secondary | ICD-10-CM | POA: Insufficient documentation

## 2016-10-09 DIAGNOSIS — R05 Cough: Secondary | ICD-10-CM

## 2016-10-09 DIAGNOSIS — F419 Anxiety disorder, unspecified: Secondary | ICD-10-CM | POA: Diagnosis not present

## 2016-10-09 DIAGNOSIS — Z79899 Other long term (current) drug therapy: Secondary | ICD-10-CM | POA: Diagnosis not present

## 2016-10-09 DIAGNOSIS — I5032 Chronic diastolic (congestive) heart failure: Secondary | ICD-10-CM | POA: Insufficient documentation

## 2016-10-09 DIAGNOSIS — D649 Anemia, unspecified: Secondary | ICD-10-CM | POA: Diagnosis not present

## 2016-10-09 DIAGNOSIS — Z96641 Presence of right artificial hip joint: Secondary | ICD-10-CM | POA: Insufficient documentation

## 2016-10-09 DIAGNOSIS — S7001XA Contusion of right hip, initial encounter: Secondary | ICD-10-CM

## 2016-10-09 DIAGNOSIS — M8580 Other specified disorders of bone density and structure, unspecified site: Secondary | ICD-10-CM | POA: Insufficient documentation

## 2016-10-09 DIAGNOSIS — I482 Chronic atrial fibrillation: Secondary | ICD-10-CM | POA: Insufficient documentation

## 2016-10-09 DIAGNOSIS — N183 Chronic kidney disease, stage 3 (moderate): Secondary | ICD-10-CM | POA: Diagnosis not present

## 2016-10-09 DIAGNOSIS — I10 Essential (primary) hypertension: Secondary | ICD-10-CM | POA: Diagnosis not present

## 2016-10-09 DIAGNOSIS — Z7901 Long term (current) use of anticoagulants: Secondary | ICD-10-CM

## 2016-10-09 DIAGNOSIS — I481 Persistent atrial fibrillation: Secondary | ICD-10-CM | POA: Insufficient documentation

## 2016-10-09 DIAGNOSIS — I472 Ventricular tachycardia: Secondary | ICD-10-CM | POA: Insufficient documentation

## 2016-10-09 DIAGNOSIS — L0291 Cutaneous abscess, unspecified: Secondary | ICD-10-CM

## 2016-10-09 DIAGNOSIS — I480 Paroxysmal atrial fibrillation: Secondary | ICD-10-CM | POA: Insufficient documentation

## 2016-10-09 DIAGNOSIS — I2510 Atherosclerotic heart disease of native coronary artery without angina pectoris: Secondary | ICD-10-CM | POA: Diagnosis not present

## 2016-10-09 DIAGNOSIS — E1122 Type 2 diabetes mellitus with diabetic chronic kidney disease: Secondary | ICD-10-CM | POA: Insufficient documentation

## 2016-10-09 DIAGNOSIS — D72829 Elevated white blood cell count, unspecified: Secondary | ICD-10-CM

## 2016-10-09 DIAGNOSIS — Z951 Presence of aortocoronary bypass graft: Secondary | ICD-10-CM | POA: Diagnosis not present

## 2016-10-09 DIAGNOSIS — I130 Hypertensive heart and chronic kidney disease with heart failure and stage 1 through stage 4 chronic kidney disease, or unspecified chronic kidney disease: Secondary | ICD-10-CM | POA: Diagnosis not present

## 2016-10-09 DIAGNOSIS — F17210 Nicotine dependence, cigarettes, uncomplicated: Secondary | ICD-10-CM | POA: Diagnosis not present

## 2016-10-09 DIAGNOSIS — M25451 Effusion, right hip: Secondary | ICD-10-CM

## 2016-10-09 DIAGNOSIS — J449 Chronic obstructive pulmonary disease, unspecified: Secondary | ICD-10-CM | POA: Diagnosis not present

## 2016-10-09 DIAGNOSIS — R0789 Other chest pain: Secondary | ICD-10-CM | POA: Diagnosis not present

## 2016-10-09 DIAGNOSIS — G8929 Other chronic pain: Secondary | ICD-10-CM | POA: Diagnosis not present

## 2016-10-09 DIAGNOSIS — M25551 Pain in right hip: Secondary | ICD-10-CM | POA: Diagnosis not present

## 2016-10-09 DIAGNOSIS — M2500 Hemarthrosis, unspecified joint: Secondary | ICD-10-CM | POA: Diagnosis present

## 2016-10-09 DIAGNOSIS — R748 Abnormal levels of other serum enzymes: Secondary | ICD-10-CM | POA: Diagnosis not present

## 2016-10-09 DIAGNOSIS — R9431 Abnormal electrocardiogram [ECG] [EKG]: Secondary | ICD-10-CM | POA: Diagnosis not present

## 2016-10-09 DIAGNOSIS — Z950 Presence of cardiac pacemaker: Secondary | ICD-10-CM | POA: Insufficient documentation

## 2016-10-09 HISTORY — DX: Disorder of arteries and arterioles, unspecified: I77.9

## 2016-10-09 HISTORY — DX: Peripheral vascular disease, unspecified: I73.9

## 2016-10-09 LAB — CBC WITH DIFFERENTIAL/PLATELET
BASOS ABS: 0.1 10*3/uL (ref 0–0.1)
BASOS PCT: 1 %
Eosinophils Absolute: 0.3 10*3/uL (ref 0–0.7)
Eosinophils Relative: 1 %
HEMATOCRIT: 40.6 % (ref 35.0–47.0)
Hemoglobin: 13.7 g/dL (ref 12.0–16.0)
LYMPHS PCT: 16 %
Lymphs Abs: 3.7 10*3/uL — ABNORMAL HIGH (ref 1.0–3.6)
MCH: 31.8 pg (ref 26.0–34.0)
MCHC: 33.7 g/dL (ref 32.0–36.0)
MCV: 94.5 fL (ref 80.0–100.0)
MONO ABS: 1.7 10*3/uL — AB (ref 0.2–0.9)
Monocytes Relative: 7 %
NEUTROS ABS: 17.9 10*3/uL — AB (ref 1.4–6.5)
NEUTROS PCT: 75 %
Platelets: 218 10*3/uL (ref 150–440)
RBC: 4.3 MIL/uL (ref 3.80–5.20)
RDW: 14.2 % (ref 11.5–14.5)
WBC: 23.6 10*3/uL — AB (ref 3.6–11.0)

## 2016-10-09 LAB — COMPREHENSIVE METABOLIC PANEL
ALT: 53 U/L (ref 14–54)
AST: 31 U/L (ref 15–41)
Albumin: 4.3 g/dL (ref 3.5–5.0)
Alkaline Phosphatase: 47 U/L (ref 38–126)
Anion gap: 10 (ref 5–15)
BUN: 51 mg/dL — AB (ref 6–20)
CHLORIDE: 105 mmol/L (ref 101–111)
CO2: 25 mmol/L (ref 22–32)
Calcium: 9.7 mg/dL (ref 8.9–10.3)
Creatinine, Ser: 1.6 mg/dL — ABNORMAL HIGH (ref 0.44–1.00)
GFR, EST AFRICAN AMERICAN: 34 mL/min — AB (ref >60)
GFR, EST NON AFRICAN AMERICAN: 30 mL/min — AB (ref >60)
Glucose, Bld: 129 mg/dL — ABNORMAL HIGH (ref 65–99)
POTASSIUM: 4 mmol/L (ref 3.5–5.1)
SODIUM: 140 mmol/L (ref 135–145)
Total Bilirubin: 0.8 mg/dL (ref 0.3–1.2)
Total Protein: 7.4 g/dL (ref 6.5–8.1)

## 2016-10-09 LAB — URINALYSIS, COMPLETE (UACMP) WITH MICROSCOPIC
Bilirubin Urine: NEGATIVE
Glucose, UA: NEGATIVE mg/dL
Hgb urine dipstick: NEGATIVE
Ketones, ur: NEGATIVE mg/dL
Nitrite: NEGATIVE
Protein, ur: 100 mg/dL — AB
Specific Gravity, Urine: 1.018 (ref 1.005–1.030)
pH: 5 (ref 5.0–8.0)

## 2016-10-09 LAB — TYPE AND SCREEN
ABO/RH(D): A NEG
Antibody Screen: NEGATIVE

## 2016-10-09 LAB — PROTIME-INR
INR: 1.11
Prothrombin Time: 14.3 seconds (ref 11.4–15.2)

## 2016-10-09 LAB — APTT: APTT: 28 s (ref 24–36)

## 2016-10-09 LAB — TROPONIN I: Troponin I: 0.05 ng/mL (ref <0.03)

## 2016-10-09 MED ORDER — ONDANSETRON HCL 4 MG/2ML IJ SOLN
4.0000 mg | INTRAMUSCULAR | Status: AC
  Administered 2016-10-09: 4 mg via INTRAVENOUS
  Filled 2016-10-09: qty 2

## 2016-10-09 MED ORDER — MORPHINE SULFATE (PF) 4 MG/ML IV SOLN
4.0000 mg | Freq: Once | INTRAVENOUS | Status: AC
  Administered 2016-10-09: 4 mg via INTRAVENOUS
  Filled 2016-10-09: qty 1

## 2016-10-09 MED ORDER — ALUM & MAG HYDROXIDE-SIMETH 200-200-20 MG/5ML PO SUSP
30.0000 mL | Freq: Once | ORAL | Status: AC
  Administered 2016-10-09: 30 mL via ORAL
  Filled 2016-10-09: qty 30

## 2016-10-09 NOTE — ED Notes (Signed)
Pt taken to xray 

## 2016-10-09 NOTE — ED Notes (Signed)
Pt returns to lobby from xray and was given a warm blanket

## 2016-10-09 NOTE — ED Notes (Signed)
Pt c/o sudden-onset chest pain. Pt states "it feels like a band tightening across my chest." +diaphoresis. Pt appears lethargic, cannot focus on conversation. VS BP 115/50, P68 (paced). EDP and IP MD notified.

## 2016-10-09 NOTE — ED Notes (Signed)
Pt placed on 2L n/c for O2 sat 89% on RA.
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
## 2016-10-09 NOTE — ED Triage Notes (Signed)
Right hip pain and swelling.  Initially pain started yesterday, but pain worsened and swelling started today.  Patient has had four surgeries on right hip, last in November 2017. unable to weight bear on right leg.

## 2016-10-09 NOTE — ED Notes (Signed)
pts daughter notified this RN that she just gave her mom one norco tablet 7.5-325mg  while waiting in the lobby. Pt with NAD noted, sitting in a wheelchair and given a warm blanket. Daughter advised that her wait shouldn't be too much longer.

## 2016-10-09 NOTE — ED Notes (Signed)
Pt reports chest pain has resolved at this time and is somewhat more alert. IP MD Marcille Blanco at bedside.

## 2016-10-10 ENCOUNTER — Observation Stay: Payer: Medicare Other

## 2016-10-10 DIAGNOSIS — G8929 Other chronic pain: Secondary | ICD-10-CM | POA: Diagnosis not present

## 2016-10-10 DIAGNOSIS — M009 Pyogenic arthritis, unspecified: Secondary | ICD-10-CM | POA: Diagnosis not present

## 2016-10-10 DIAGNOSIS — R0789 Other chest pain: Secondary | ICD-10-CM | POA: Diagnosis not present

## 2016-10-10 DIAGNOSIS — M2500 Hemarthrosis, unspecified joint: Secondary | ICD-10-CM | POA: Diagnosis not present

## 2016-10-10 DIAGNOSIS — N183 Chronic kidney disease, stage 3 (moderate): Secondary | ICD-10-CM | POA: Diagnosis not present

## 2016-10-10 DIAGNOSIS — M25551 Pain in right hip: Secondary | ICD-10-CM | POA: Diagnosis not present

## 2016-10-10 DIAGNOSIS — R05 Cough: Secondary | ICD-10-CM | POA: Diagnosis not present

## 2016-10-10 DIAGNOSIS — Z96643 Presence of artificial hip joint, bilateral: Secondary | ICD-10-CM | POA: Diagnosis not present

## 2016-10-10 LAB — COMPREHENSIVE METABOLIC PANEL
ALK PHOS: 32 U/L — AB (ref 38–126)
ALT: 36 U/L (ref 14–54)
AST: 19 U/L (ref 15–41)
Albumin: 2.9 g/dL — ABNORMAL LOW (ref 3.5–5.0)
Anion gap: 6 (ref 5–15)
BUN: 47 mg/dL — ABNORMAL HIGH (ref 6–20)
CALCIUM: 7.8 mg/dL — AB (ref 8.9–10.3)
CO2: 23 mmol/L (ref 22–32)
Chloride: 108 mmol/L (ref 101–111)
Creatinine, Ser: 1.63 mg/dL — ABNORMAL HIGH (ref 0.44–1.00)
GFR calc non Af Amer: 29 mL/min — ABNORMAL LOW (ref >60)
GFR, EST AFRICAN AMERICAN: 34 mL/min — AB (ref >60)
GLUCOSE: 145 mg/dL — AB (ref 65–99)
Potassium: 4 mmol/L (ref 3.5–5.1)
SODIUM: 137 mmol/L (ref 135–145)
Total Bilirubin: 0.9 mg/dL (ref 0.3–1.2)
Total Protein: 5.3 g/dL — ABNORMAL LOW (ref 6.5–8.1)

## 2016-10-10 LAB — HEPARIN LEVEL (UNFRACTIONATED)

## 2016-10-10 LAB — TROPONIN I
Troponin I: 0.06 ng/mL (ref <0.03)
Troponin I: 0.07 ng/mL (ref <0.03)
Troponin I: 0.07 ng/mL (ref <0.03)

## 2016-10-10 LAB — MAGNESIUM: Magnesium: 1.8 mg/dL (ref 1.7–2.4)

## 2016-10-10 MED ORDER — PSYLLIUM 95 % PO PACK
1.0000 | PACK | Freq: Every day | ORAL | Status: DC
  Administered 2016-10-10 – 2016-10-11 (×3): 1 via ORAL
  Filled 2016-10-10 (×4): qty 1

## 2016-10-10 MED ORDER — LORATADINE 10 MG PO TABS
10.0000 mg | ORAL_TABLET | Freq: Every day | ORAL | Status: DC
  Administered 2016-10-10 – 2016-10-12 (×3): 10 mg via ORAL
  Filled 2016-10-10 (×3): qty 1

## 2016-10-10 MED ORDER — ONDANSETRON HCL 4 MG PO TABS: 4.0000 mg | ORAL_TABLET | Freq: Four times a day (QID) | ORAL | Status: DC | PRN

## 2016-10-10 MED ORDER — APIXABAN 5 MG PO TABS
5.0000 mg | ORAL_TABLET | Freq: Two times a day (BID) | ORAL | Status: DC
  Administered 2016-10-10 (×2): 5 mg via ORAL
  Filled 2016-10-10 (×2): qty 1

## 2016-10-10 MED ORDER — ONDANSETRON HCL 4 MG/2ML IJ SOLN: 4.0000 mg | Freq: Four times a day (QID) | INTRAMUSCULAR | Status: DC | PRN

## 2016-10-10 MED ORDER — OXYCODONE-ACETAMINOPHEN 5-325 MG PO TABS
1.0000 | ORAL_TABLET | Freq: Four times a day (QID) | ORAL | Status: DC | PRN
  Administered 2016-10-11: 1 via ORAL
  Filled 2016-10-10: qty 1

## 2016-10-10 MED ORDER — PSYLLIUM 95 % PO PACK
1.0000 | PACK | Freq: Every day | ORAL | Status: DC
  Administered 2016-10-10 – 2016-10-11 (×2): 1 via ORAL
  Filled 2016-10-10 (×3): qty 1

## 2016-10-10 MED ORDER — ISOSORBIDE MONONITRATE ER 30 MG PO TB24
120.0000 mg | ORAL_TABLET | Freq: Every day | ORAL | Status: DC
  Administered 2016-10-10 – 2016-10-12 (×3): 120 mg via ORAL
  Filled 2016-10-10 (×3): qty 4

## 2016-10-10 MED ORDER — HEPARIN (PORCINE) IN NACL 100-0.45 UNIT/ML-% IJ SOLN
14.0000 [IU]/kg/h | INTRAMUSCULAR | Status: DC
  Administered 2016-10-10: 14 [IU]/kg/h via INTRAVENOUS
  Filled 2016-10-10: qty 250

## 2016-10-10 MED ORDER — MORPHINE SULFATE (PF) 2 MG/ML IV SOLN
2.0000 mg | INTRAVENOUS | Status: DC | PRN
Start: 2016-10-10 — End: 2016-10-12
  Administered 2016-10-12: 2 mg via INTRAVENOUS
  Filled 2016-10-10: qty 1

## 2016-10-10 MED ORDER — ACETAMINOPHEN 325 MG PO TABS
650.0000 mg | ORAL_TABLET | Freq: Four times a day (QID) | ORAL | Status: DC | PRN
  Administered 2016-10-10: 650 mg via ORAL
  Filled 2016-10-10: qty 2

## 2016-10-10 MED ORDER — PSYLLIUM 0.52 G PO CAPS: 0.5200 g | ORAL_CAPSULE | ORAL | Status: DC

## 2016-10-10 MED ORDER — METOPROLOL SUCCINATE ER 50 MG PO TB24
100.0000 mg | ORAL_TABLET | Freq: Two times a day (BID) | ORAL | Status: DC
  Administered 2016-10-10 – 2016-10-12 (×5): 100 mg via ORAL
  Filled 2016-10-10 (×6): qty 2

## 2016-10-10 MED ORDER — DOCUSATE SODIUM 100 MG PO CAPS
100.0000 mg | ORAL_CAPSULE | Freq: Two times a day (BID) | ORAL | Status: DC
  Administered 2016-10-10 – 2016-10-12 (×6): 100 mg via ORAL
  Filled 2016-10-10 (×6): qty 1

## 2016-10-10 MED ORDER — TRIAMCINOLONE ACETONIDE 0.1 % EX CREA
1.0000 "application " | TOPICAL_CREAM | Freq: Two times a day (BID) | CUTANEOUS | Status: DC
  Administered 2016-10-10 – 2016-10-12 (×4): 1 via TOPICAL
  Filled 2016-10-10: qty 15

## 2016-10-10 MED ORDER — OXYBUTYNIN CHLORIDE ER 5 MG PO TB24
5.0000 mg | ORAL_TABLET | Freq: Every day | ORAL | Status: DC
  Administered 2016-10-10 – 2016-10-11 (×3): 5 mg via ORAL
  Filled 2016-10-10 (×3): qty 1

## 2016-10-10 MED ORDER — ACETAMINOPHEN 650 MG RE SUPP: 650.0000 mg | Freq: Four times a day (QID) | RECTAL | Status: DC | PRN

## 2016-10-10 MED ORDER — HYDROCHLOROTHIAZIDE 12.5 MG PO CAPS
12.5000 mg | ORAL_CAPSULE | Freq: Every day | ORAL | Status: DC
  Administered 2016-10-10 – 2016-10-12 (×3): 12.5 mg via ORAL
  Filled 2016-10-10 (×3): qty 1

## 2016-10-10 MED ORDER — VITAMIN D 1000 UNITS PO TABS
1000.0000 [IU] | ORAL_TABLET | Freq: Every day | ORAL | Status: DC
  Administered 2016-10-10 – 2016-10-12 (×3): 1000 [IU] via ORAL
  Filled 2016-10-10 (×3): qty 1

## 2016-10-10 MED ORDER — HYDROCODONE-ACETAMINOPHEN 7.5-325 MG PO TABS
1.0000 | ORAL_TABLET | ORAL | Status: DC | PRN
  Administered 2016-10-10 – 2016-10-11 (×2): 1 via ORAL
  Filled 2016-10-10 (×3): qty 1

## 2016-10-10 MED ORDER — ATORVASTATIN CALCIUM 20 MG PO TABS
20.0000 mg | ORAL_TABLET | Freq: Every evening | ORAL | Status: DC
  Administered 2016-10-10 – 2016-10-11 (×2): 20 mg via ORAL
  Filled 2016-10-10 (×2): qty 1

## 2016-10-10 MED ORDER — LORAZEPAM 0.5 MG PO TABS
0.5000 mg | ORAL_TABLET | Freq: Two times a day (BID) | ORAL | Status: DC | PRN
  Administered 2016-10-10: 0.5 mg via ORAL
  Filled 2016-10-10: qty 1

## 2016-10-10 MED ORDER — SODIUM CHLORIDE 0.9 % IV SOLN
INTRAVENOUS | Status: DC
  Administered 2016-10-10 – 2016-10-11 (×5): via INTRAVENOUS

## 2016-10-10 MED ORDER — AMLODIPINE BESYLATE 10 MG PO TABS
10.0000 mg | ORAL_TABLET | Freq: Every day | ORAL | Status: DC
  Administered 2016-10-10 – 2016-10-12 (×3): 10 mg via ORAL
  Filled 2016-10-10 (×3): qty 1

## 2016-10-10 NOTE — Progress Notes (Signed)
Pt is alert and oriented. Medicated for pain with good results. Iv infusing without difficulty. Using bedpan to void. Pillows in between legs to prevent internally rotating hip. Daughter at bedside.

## 2016-10-10 NOTE — Progress Notes (Signed)
Dr notified of 3 beat run of V Tach. Ordered Chem 8 and mag labs. Pt was unsymptomatic at time of episode.

## 2016-10-10 NOTE — Progress Notes (Signed)
Notified MD about elevated troponin of 0.07. Dr. Michela Pitcher to notify her if troponin increases to 0.10 or above, or if pt has chest pain.

## 2016-10-10 NOTE — Progress Notes (Signed)
Troponin of 0.06 Dr. Jannifer Franklin notified. No new orders at this time.
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
## 2016-10-10 NOTE — Care Management Obs Status (Signed)
[facility] NOTIFICATION   Patient Details  Name: Wanda Brooks MRN: [id number] Date of Birth: 20-Dec-1935   Medicare Observation Status Notification Given:  Yes    Burnette Valenti A, RN 10/10/2016, 2:58 PM
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
## 2016-10-10 NOTE — Progress Notes (Signed)
Alert and oriented x4. VSS. Pt had one episode of incontinence. Pt was bathed and changed at that time. Daughter said she would be back tonight to stay with pt overnight.
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
## 2016-10-10 NOTE — Progress Notes (Addendum)
[facility] [place] NAME: Wanda Brooks    MR#:  [id number]  DATE OF BIRTH:  06-06-1935  SUBJECTIVE:  CHIEF COMPLAINT:  Patient's hip pain is manageable. Chronically she has right hip pain 3 out of 10 today her pain is at 4 out of 10. Daughter at bedside  REVIEW OF SYSTEMS:  CONSTITUTIONAL: No fever, fatigue or weakness.  EYES: No blurred or double vision.  EARS, NOSE, AND THROAT: No tinnitus or ear pain.  RESPIRATORY: No cough, shortness of breath, wheezing or hemoptysis.  CARDIOVASCULAR: No chest pain, orthopnea, edema.  GASTROINTESTINAL: No nausea, vomiting, diarrhea or abdominal pain.  GENITOURINARY: No dysuria, hematuria.  ENDOCRINE: No polyuria, nocturia,  HEMATOLOGY: No anemia, easy bruising or bleeding SKIN: No rash or lesion. MUSCULOSKELETAL: Acute on chronic right hip pain NEUROLOGIC: No tingling, numbness, weakness.  PSYCHIATRY: No anxiety or depression.   DRUG ALLERGIES:   Allergies  Allergen Reactions  . Aspirin Other (See Comments)    Stomach burning  Can only take coated  . Daypro [Oxaprozin] Other (See Comments)    Dizziness Affects driving  . Multaq [Dronedarone] Diarrhea    VITALS:  Blood pressure (!) 142/41, pulse (!) 59, temperature 99 F (37.2 C), temperature source Oral, resp. rate 17, height 5\' 5"  (1.651 m), weight 70.3 kg (155 lb), SpO2 97 %.  PHYSICAL EXAMINATION:  GENERAL:  81 y.o.-year-old patient lying in the bed with no acute distress.  EYES: Pupils equal, round, reactive to light and accommodation. No scleral icterus. Extraocular muscles intact.  HEENT: Head atraumatic, normocephalic. Oropharynx and nasopharynx clear.  NECK:  Supple, no jugular venous distention. No thyroid enlargement, no tenderness.  LUNGS: Normal breath sounds bilaterally, no wheezing, rales,rhonchi or crepitation. No use of accessory muscles of respiration.  CARDIOVASCULAR: S1, S2 normal. No murmurs, rubs, or gallops.   ABDOMEN: Soft, nontender, nondistended. Bowel sounds present. No organomegaly or mass.  EXTREMITIES: Right hip is tender , well-healed surgical scars No pedal edema, cyanosis, or clubbing.  NEUROLOGIC: Cranial nerves II through XII are intact. Muscle strength 5/5 in all extremities except right hip joint with 3 out of 5. Sensation intact. Gait not checked.  PSYCHIATRIC: The patient is alert and oriented x 3.  SKIN: No obvious rash, lesion, or ulcer.    LABORATORY PANEL:   CBC  Recent Labs Lab 10/09/16 2112  WBC 23.6*  HGB 13.7  HCT 40.6  PLT 218   ------------------------------------------------------------------------------------------------------------------  Chemistries   Recent Labs Lab 10/09/16 2112  NA 140  K 4.0  CL 105  CO2 25  GLUCOSE 129*  BUN 51*  CREATININE 1.60*  CALCIUM 9.7  AST 31  ALT 53  ALKPHOS 47  BILITOT 0.8   ------------------------------------------------------------------------------------------------------------------  Cardiac Enzymes  Recent Labs Lab 10/10/16 0855  TROPONINI 0.07*   ------------------------------------------------------------------------------------------------------------------  RADIOLOGY:  Ct Hip Right Wo Contrast  Result Date: 10/09/2016 CLINICAL DATA:  Right hip pain and swelling since yesterday. Previous 4 surgeries on the right hip, last in November, 2017. EXAM: CT OF THE RIGHT HIP WITHOUT CONTRAST TECHNIQUE: Multidetector CT imaging of the right hip was performed according to the standard protocol. Multiplanar CT image reconstructions were also generated. 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 No significant change in the configuration of the protrusio deformity or definite fracture lucencies identified though due to streak artifacts from the patient's hip arthroplasty, subtle  fractures can  be obscured as a result. The prosthetic femoral head is centered within the acetabular component without evidence of asymmetric lining wear. No loosening of the fixation screws of the acetabular component is apparent. Bony demineralization is seen along the posterior aspect of the proximal femora along the femoral uncemented component. Chronic linear lucency along the medial aspect of the proximal femur without cortical breakthrough is also again noted series 2, image 81. Of note is a new moderate to large right-sided joint effusion with hyperdense material within possibly representing a hemarthrosis. An infected joint is not excluded. There is a soft tissue subcutaneous collection overlying the lateral aspect of the right hip, also new in appearance and may reflect continuation of the joint fluid or potentially an overlying hematoma. This subcutaneous fluid collection measures 5.3 x 4.7 x 9.2 cm. Ligaments Suboptimally assessed by CT. Muscles and Tendons Mild gluteal atrophy.  No intramuscular mass, hemorrhage or abscess. Soft tissues As above. IMPRESSION: 1. Moderate to large right hip joint effusion with heterogeneous material seen within either representing an infected joint or hemarthrosis. There is an ovoid subcutaneous heterogeneous fluid component overlying the lateral aspect of the right hip measuring 5.3 x 4.7 x 9.2 cm. Differential possibilities would include continuation of the joint effusion from the right hip or separate subcutaneous hematoma or potentially abscess. Percutaneous sampling of the joint and fluid collection may prove useful for further management. 2. Chronic protrusio the right acetabulum with bony demineralization of the acetabulum and proximal femur. No new fracture, hardware failure or dislocation is apparent. Electronically Signed   By: Ashley Royalty M.D.   On: 10/09/2016 20:40   Dg Chest Port 1 View  Result Date: 10/10/2016 CLINICAL DATA:  Cough.  History of COPD.  EXAM: PORTABLE CHEST 1 VIEW COMPARISON:  Chest radiograph June 27, 2016 FINDINGS: Cardiac silhouette is mildly enlarged and unchanged. Calcified aortic knob. Mild chronic interstitial changes without pleural effusion or focal consolidation. Pulmonary vasculature is normal. No pneumothorax. Status post median sternotomy for CABG. Dual lead LEFT cardiac pacemaker in situ. Surgical material in the neck, present on prior examination. Inferior vena cava filter present. IMPRESSION: Mild cardiomegaly and chronic interstitial changes. Electronically Signed   By: Elon Alas M.D.   On: 10/10/2016 02:49   Dg Hip Unilat W Or Wo Pelvis 2-3 Views Right  Result Date: 10/09/2016 CLINICAL DATA:  Right hip pain for the past 1-2 days, worse this morning. No known injury. Previous hip replacement in 2017. EXAM: DG HIP (WITH OR WITHOUT PELVIS) 2-3V RIGHT COMPARISON:  01/07/2016. FINDINGS: Right total hip prosthesis with tree to acetabuli is unchanged in appearance. No fracture or dislocation. No evidence of interval prosthetic loosening. Diffuse osteopenia. Lower lumbar spine degenerative changes. IMPRESSION: Acute abnormality. Electronically Signed   By: Claudie Revering M.D.   On: 10/09/2016 16:59    EKG:   Orders placed or performed during the hospital encounter of 10/09/16  . ED EKG  . ED EKG    ASSESSMENT AND PLAN:     This is a 81 year old female admitted for hip pain.  1. Acute on chronic right Hip pain: Right hip hemarthrosis.  Clinically stable  Discussed with orthopedics dr.Poggi has recommended fluoroscopy-guided aspiration by interventional radiology -call placed [phone] with Dr. Barbie Banner , will schedule for Monday afternoon, hold ELIQUIS and start heparin drip ED physician has discussed with [facility] orthopedic physician  at the time of admission to [facility], they did not recommend to transfer the patient to [facility]   2. Atypical  chest pain:  The patient had a brief  episode of chest pain that was more left upper quadrant of the abdomen radiating into her chest. Currently chest pain-free Troponins 0.05 - 0.06-0.07, no significant trend if necessary will put a cardiology consult to [facility]   Monitor on telemetry.  3. Essential hypertension: improved  continue amlodipine, metoprolol and HCTZ. Labetalol as needed.  4. CKD: Stage III; hydrate with intravenous fluid. Avoid nephrotoxic agents  5. Atrial fibrillation: Rate controlled; hemoglobin is stable. HOLD  Eliquis as risk of thromboembolism may be greater then risk of bleeding , start patient on heparin drip until she gets hip aspiration on Monday, then resume ELIQUIS  6. CAD: Continue Imdur  7. Hyperlipidemia: Continue statin therapy  8. DVT prophylaxis: Full dose anticoagulation  9. GI prophylaxis: None   All the records are reviewed and case discussed with Care Management/Social Workerr. Management plans discussed with the patient, family and they are in agreement.  CODE STATUS: fc  TOTAL TIME TAKING CARE OF THIS PATIENT: 35 minutes.   POSSIBLE D/C IN 2 DAYS, DEPENDING ON CLINICAL CONDITION.  Note: This dictation was prepared with Dragon dictation along with smaller phrase technology. Any transcriptional errors that result from this process are unintentional.   Nicholes Mango M.D on 10/10/2016 at 1:53 PM  Between 7am to 6pm - Pager - [phone] After 6pm go to [email or web address] - password [email or web address] [facility]  Office  [phone]  CC: Primary care physician; Jearld Fenton, NP

## 2016-10-11 DIAGNOSIS — N183 Chronic kidney disease, stage 3 (moderate): Secondary | ICD-10-CM | POA: Diagnosis not present

## 2016-10-11 DIAGNOSIS — M009 Pyogenic arthritis, unspecified: Secondary | ICD-10-CM | POA: Diagnosis not present

## 2016-10-11 DIAGNOSIS — M2500 Hemarthrosis, unspecified joint: Secondary | ICD-10-CM | POA: Diagnosis not present

## 2016-10-11 DIAGNOSIS — Z96643 Presence of artificial hip joint, bilateral: Secondary | ICD-10-CM | POA: Diagnosis not present

## 2016-10-11 DIAGNOSIS — G8929 Other chronic pain: Secondary | ICD-10-CM | POA: Diagnosis not present

## 2016-10-11 DIAGNOSIS — R0789 Other chest pain: Secondary | ICD-10-CM | POA: Diagnosis not present

## 2016-10-11 DIAGNOSIS — M25551 Pain in right hip: Secondary | ICD-10-CM | POA: Diagnosis not present

## 2016-10-11 LAB — APTT
APTT: 112 s — AB (ref 24–36)
APTT: 69 s — AB (ref 24–36)
APTT: 150 s — AB (ref 24–36)

## 2016-10-11 LAB — CBC
HCT: 29 % — ABNORMAL LOW (ref 35.0–47.0)
HEMOGLOBIN: 9.7 g/dL — AB (ref 12.0–16.0)
MCH: 31.3 pg (ref 26.0–34.0)
MCHC: 33.5 g/dL (ref 32.0–36.0)
MCV: 93.2 fL (ref 80.0–100.0)
Platelets: 154 10*3/uL (ref 150–440)
RBC: 3.11 MIL/uL — ABNORMAL LOW (ref 3.80–5.20)
RDW: 14.2 % (ref 11.5–14.5)
WBC: 15.3 10*3/uL — AB (ref 3.6–11.0)

## 2016-10-11 LAB — HEPARIN LEVEL (UNFRACTIONATED)
HEPARIN UNFRACTIONATED: 2.3 [IU]/mL — AB (ref 0.30–0.70)
Heparin Unfractionated: >3.6 IU/mL — ABNORMAL HIGH (ref 0.30–0.70)

## 2016-10-11 MED ORDER — HEPARIN (PORCINE) IN NACL 100-0.45 UNIT/ML-% IJ SOLN
900.0000 [IU]/h | INTRAMUSCULAR | Status: DC
  Administered 2016-10-11: 900 [IU]/h via INTRAVENOUS

## 2016-10-11 MED ORDER — HEPARIN (PORCINE) IN NACL 100-0.45 UNIT/ML-% IJ SOLN
950.0000 [IU]/h | INTRAMUSCULAR | Status: DC
  Administered 2016-10-11: 850 [IU]/h via INTRAVENOUS
  Administered 2016-10-11: 800 [IU]/h via INTRAVENOUS
  Filled 2016-10-11: qty 250
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
## 2016-10-11 NOTE — Progress Notes (Signed)
[facility] [place] NAME: Wanda Brooks    MR#:  [id number]  DATE OF BIRTH:  Jan 01, 1936  SUBJECTIVE:  CHIEF COMPLAINT:  Patient's hip pain is manageable. Spiked temp 101 yesterday but afebrile today. Chronically she has right hip pain 3 out of 10 today .Daughter at bedside Agreeable for hip aspiration tomorrow  REVIEW OF SYSTEMS:  CONSTITUTIONAL: No fever today,no  fatigue or weakness.  EYES: No blurred or double vision.  EARS, NOSE, AND THROAT: No tinnitus or ear pain.  RESPIRATORY: No cough, shortness of breath, wheezing or hemoptysis.  CARDIOVASCULAR: No chest pain, orthopnea, edema.  GASTROINTESTINAL: No nausea, vomiting, diarrhea or abdominal pain.  GENITOURINARY: No dysuria, hematuria.  ENDOCRINE: No polyuria, nocturia,  HEMATOLOGY: No anemia, easy bruising or bleeding SKIN: No rash or lesion. MUSCULOSKELETAL: Acute on chronic right hip pain NEUROLOGIC: No tingling, numbness, weakness.  PSYCHIATRY: No anxiety or depression.   DRUG ALLERGIES:   Allergies  Allergen Reactions  . Aspirin Other (See Comments)    Stomach burning  Can only take coated  . Daypro [Oxaprozin] Other (See Comments)    Dizziness Affects driving  . Multaq [Dronedarone] Diarrhea    VITALS:  Blood pressure (!) 148/36, pulse 63, temperature 99.8 F (37.7 C), resp. rate 18, height 5\' 5"  (1.651 m), weight 76.4 kg (168 lb 8 oz), SpO2 97 %.  PHYSICAL EXAMINATION:  GENERAL:  81 y.o.-year-old patient lying in the bed with no acute distress.  EYES: Pupils equal, round, reactive to light and accommodation. No scleral icterus. Extraocular muscles intact.  HEENT: Head atraumatic, normocephalic. Oropharynx and nasopharynx clear.  NECK:  Supple, no jugular venous distention. No thyroid enlargement, no tenderness.  LUNGS: Normal breath sounds bilaterally, no wheezing, rales,rhonchi or crepitation. No use of accessory muscles of respiration.  CARDIOVASCULAR:  S1, S2 normal. No murmurs, rubs, or gallops.  ABDOMEN: Soft, nontender, nondistended. Bowel sounds present. No organomegaly or mass.  EXTREMITIES: Right hip is tender , well-healed surgical scars  No pedal edema, cyanosis, or clubbing.  NEUROLOGIC: Cranial nerves II through XII are intact. Muscle strength 5/5 in all extremities except right hip joint with 3 out of 5. Sensation intact. Gait not checked.  PSYCHIATRIC: The patient is alert and oriented x 3.  SKIN: No obvious rash, lesion, or ulcer.    LABORATORY PANEL:   CBC  Recent Labs Lab 10/11/16 0753  WBC 15.3*  HGB 9.7*  HCT 29.0*  PLT 154   ------------------------------------------------------------------------------------------------------------------  Chemistries   Recent Labs Lab 10/10/16 1818  NA 137  K 4.0  CL 108  CO2 23  GLUCOSE 145*  BUN 47*  CREATININE 1.63*  CALCIUM 7.8*  MG 1.8  AST 19  ALT 36  ALKPHOS 32*  BILITOT 0.9   ------------------------------------------------------------------------------------------------------------------  Cardiac Enzymes  Recent Labs Lab 10/10/16 1452  TROPONINI 0.07*   ------------------------------------------------------------------------------------------------------------------  RADIOLOGY:  Ct Hip Right Wo Contrast  Result Date: 10/09/2016 CLINICAL DATA:  Right hip pain and swelling since yesterday. Previous 4 surgeries on the right hip, last in November, 2017. EXAM: CT OF THE RIGHT HIP WITHOUT CONTRAST TECHNIQUE: Multidetector CT imaging of the right hip was performed according to the standard protocol. Multiplanar CT image reconstructions were also generated. 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 No significant change in the configuration of the protrusio deformity or definite fracture lucencies identified though due to  streak artifacts  from the patient's hip arthroplasty, subtle fractures can be obscured as a result. The prosthetic femoral head is centered within the acetabular component without evidence of asymmetric lining wear. No loosening of the fixation screws of the acetabular component is apparent. Bony demineralization is seen along the posterior aspect of the proximal femora along the femoral uncemented component. Chronic linear lucency along the medial aspect of the proximal femur without cortical breakthrough is also again noted series 2, image 81. Of note is a new moderate to large right-sided joint effusion with hyperdense material within possibly representing a hemarthrosis. An infected joint is not excluded. There is a soft tissue subcutaneous collection overlying the lateral aspect of the right hip, also new in appearance and may reflect continuation of the joint fluid or potentially an overlying hematoma. This subcutaneous fluid collection measures 5.3 x 4.7 x 9.2 cm. Ligaments Suboptimally assessed by CT. Muscles and Tendons Mild gluteal atrophy.  No intramuscular mass, hemorrhage or abscess. Soft tissues As above. IMPRESSION: 1. Moderate to large right hip joint effusion with heterogeneous material seen within either representing an infected joint or hemarthrosis. There is an ovoid subcutaneous heterogeneous fluid component overlying the lateral aspect of the right hip measuring 5.3 x 4.7 x 9.2 cm. Differential possibilities would include continuation of the joint effusion from the right hip or separate subcutaneous hematoma or potentially abscess. Percutaneous sampling of the joint and fluid collection may prove useful for further management. 2. Chronic protrusio the right acetabulum with bony demineralization of the acetabulum and proximal femur. No new fracture, hardware failure or dislocation is apparent. Electronically Signed   By: Ashley Royalty M.D.   On: 10/09/2016 20:40   Dg Chest Port 1 View  Result  Date: 10/10/2016 CLINICAL DATA:  Cough.  History of COPD. EXAM: PORTABLE CHEST 1 VIEW COMPARISON:  Chest radiograph June 27, 2016 FINDINGS: Cardiac silhouette is mildly enlarged and unchanged. Calcified aortic knob. Mild chronic interstitial changes without pleural effusion or focal consolidation. Pulmonary vasculature is normal. No pneumothorax. Status post median sternotomy for CABG. Dual lead LEFT cardiac pacemaker in situ. Surgical material in the neck, present on prior examination. Inferior vena cava filter present. IMPRESSION: Mild cardiomegaly and chronic interstitial changes. Electronically Signed   By: Elon Alas M.D.   On: 10/10/2016 02:49   Dg Hip Unilat W Or Wo Pelvis 2-3 Views Right  Result Date: 10/09/2016 CLINICAL DATA:  Right hip pain for the past 1-2 days, worse this morning. No known injury. Previous hip replacement in 2017. EXAM: DG HIP (WITH OR WITHOUT PELVIS) 2-3V RIGHT COMPARISON:  01/07/2016. FINDINGS: Right total hip prosthesis with tree to acetabuli is unchanged in appearance. No fracture or dislocation. No evidence of interval prosthetic loosening. Diffuse osteopenia. Lower lumbar spine degenerative changes. IMPRESSION: Acute abnormality. Electronically Signed   By: Claudie Revering M.D.   On: 10/09/2016 16:59    EKG:   Orders placed or performed during the hospital encounter of 10/09/16  . ED EKG  . ED EKG    ASSESSMENT AND PLAN:     This is a 81 year old female admitted for hip pain.  1. Acute on chronic right Hip pain: Right hip hemarthrosis.  Clinically stable  Discussed with orthopedics dr.Poggi has recommended fluoroscopy-guided aspiration scheduled by interventional radiology - [phone] with Dr. Barbie Banner , will schedule for tomorrow  afternoon, hold ELIQUIS and on heparin drip.We will send it for culture after aspiration NPO after MN Patient was febrile yesterday afternoon, but afebrile now. We will get blood  cultures if patient is febrile again  and start her on broad-spectrum IV antibiotics for possible septic hip Dr.Poggi is recommending to transfer  the patient to a  [employer] trained hip reconstructive surgeon if there is an infection as this potentially would be an extremely complicated procedure, given her multiple prior operations. Patient's daughter is requesting [facility] and dr.Frank Alusio's service  2. Atypical chest pain:  The patient had a brief episode of chest pain that was more left upper quadrant of the abdomen radiating into her chest. Currently chest pain-free Troponins 0.05 - 0.06-0.07, no significant trend  will put a cardiology consult to [facility] per patient's request   Monitor on telemetry.  3. Essential hypertension: improved  continue amlodipine, metoprolol and HCTZ. Labetalol as needed.  4. CKD: Stage III; hydrate with intravenous fluid. Avoid nephrotoxic agents  5. Atrial fibrillation: Rate controlled; hemoglobin is stable. HOLD  Eliquis as risk of thromboembolism may be greater then risk of bleeding , start patient on heparin drip until she gets hip aspiration on Monday, then resume ELIQUIS  6. CAD: Continue Imdur  7. Hyperlipidemia: Continue statin therapy  8. DVT prophylaxis: Full dose anticoagulation  9. GI prophylaxis: None   All the records are reviewed and case discussed with Care Management/Social Workerr. Management plans discussed with the patient, family and they are in agreement.  CODE STATUS: fc  TOTAL TIME TAKING CARE OF THIS PATIENT: 35 minutes.   POSSIBLE D/C IN 2 DAYS, DEPENDING ON CLINICAL CONDITION.  Note: This dictation was prepared with Dragon dictation along with smaller phrase technology. Any transcriptional errors that result from this process are unintentional.   Nicholes Mango M.D on 10/11/2016 at 11:54 AM  Between 7am to 6pm - Pager - [phone] After 6pm go to [email or web address] - password [email or web address] [facility]  Office   [phone]  CC: Primary care physician; Jearld Fenton, NP

## 2016-10-11 NOTE — Progress Notes (Signed)
Subjective: The patient notes that her hip feels somewhat better today. She has no other new complaints.   Objective: Vital signs in last 24 hours: Temp:  [99.3 F (37.4 C)-101 F (38.3 C)] 99.8 F (37.7 C) (08/12 0743) Pulse Rate:  [61-63] 63 (08/12 0743) Resp:  [18] 18 (08/11 1547) BP: (99-148)/(35-85) 148/36 (08/12 0743) SpO2:  [95 %-97 %] 97 % (08/12 0743) Weight:  [76.4 kg (168 lb 8 oz)] 76.4 kg (168 lb 8 oz) (08/12 0600)  Intake/Output from previous day: 08/11 0701 - 08/12 0700 In: 1889.7 [P.O.:480; I.V.:1409.7] Out: 200 [Urine:200] Intake/Output this shift: Total I/O In: 240 [P.O.:240] Out: 150 [Urine:150]   Recent Labs  10/09/16 2112 10/11/16 0753  HGB 13.7 9.7*    Recent Labs  10/09/16 2112 10/11/16 0753  WBC 23.6* 15.3*  RBC 4.30 3.11*  HCT 40.6 29.0*  PLT 218 154    Recent Labs  10/09/16 2112 10/10/16 1818  NA 140 137  K 4.0 4.0  CL 105 108  CO2 25 23  BUN 51* 47*  CREATININE 1.60* 1.63*  GLUCOSE 129* 145*  CALCIUM 9.7 7.8*    Recent Labs  10/09/16 2112  INR 1.11    Physical Exam: On examination, moderate swelling persists around the lateral aspect of the right hip. There is no active drainage from the wound, which remains well-healed, but there is some warmth and erythema along the incision site. She has mild tenderness to palpation over the lateral aspect of the hip. She is neurovascular intact to the right lower extremity and foot.  Assessment: Possible infection status post revision right total hip arthroplasty.  Plan: I have discussed the plan with the patient's daughter, who is now in agreement with proceeding with the hip aspiration as planned for tomorrow. The interventional radiologist does not want to do any invasive procedure until she has been off eloquence for 48 hours. In addition, the patient's daughter has requested that we try to send the patient back to [facility] so that she can be under the care of her hip surgeon,  Dr. Paralee Cancel.   I have left a message with Dr. Alvan Dame and am awaiting a phone call back to try to discuss the transfer of this patient.  Meanwhile, we shall proceed with the planned hip aspiration for tomorrow unless Dr. Alvan Dame calls back and requests that the patient be transferred to [facility] sooner. Please try to avoid starting IV antibiotics until we have obtained a culture from the hip aspiration so long as she remains hemodynamically stable. If she starts to demonstrate signs of sepsis, then it will become necessary to start antibiotics.   Wanda Brooks 10/11/2016, 1:52 PM

## 2016-10-11 NOTE — Progress Notes (Signed)
PM dose of Metoprolol- XL 100mg  PO, held due to patient's VS: HR 62, BP 99/85
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
## 2016-10-12 ENCOUNTER — Inpatient Hospital Stay (HOSPITAL_COMMUNITY)
Admission: AD | Admit: 2016-10-12 | Discharge: 2016-10-14 | DRG: 554 | Disposition: A | Discharge status: Home Health | Payer: Medicare Other | Source: Other Acute Inpatient Hospital | Attending: Internal Medicine | Admitting: Internal Medicine

## 2016-10-12 ENCOUNTER — Observation Stay: Payer: Medicare Other

## 2016-10-12 ENCOUNTER — Encounter: Payer: Self-pay | Admitting: Nurse Practitioner

## 2016-10-12 ENCOUNTER — Encounter (HOSPITAL_COMMUNITY): Payer: Self-pay | Admitting: *Deleted

## 2016-10-12 DIAGNOSIS — F17210 Nicotine dependence, cigarettes, uncomplicated: Secondary | ICD-10-CM | POA: Diagnosis present

## 2016-10-12 DIAGNOSIS — M24451 Recurrent dislocation, right hip: Secondary | ICD-10-CM | POA: Diagnosis present

## 2016-10-12 DIAGNOSIS — I2510 Atherosclerotic heart disease of native coronary artery without angina pectoris: Secondary | ICD-10-CM | POA: Diagnosis present

## 2016-10-12 DIAGNOSIS — Z7901 Long term (current) use of anticoagulants: Secondary | ICD-10-CM | POA: Diagnosis not present

## 2016-10-12 DIAGNOSIS — J449 Chronic obstructive pulmonary disease, unspecified: Secondary | ICD-10-CM | POA: Diagnosis not present

## 2016-10-12 DIAGNOSIS — I482 Chronic atrial fibrillation, unspecified: Secondary | ICD-10-CM | POA: Diagnosis present

## 2016-10-12 DIAGNOSIS — R0789 Other chest pain: Secondary | ICD-10-CM | POA: Diagnosis present

## 2016-10-12 DIAGNOSIS — E119 Type 2 diabetes mellitus without complications: Secondary | ICD-10-CM

## 2016-10-12 DIAGNOSIS — N183 Chronic kidney disease, stage 3 unspecified: Secondary | ICD-10-CM | POA: Diagnosis present

## 2016-10-12 DIAGNOSIS — I10 Essential (primary) hypertension: Secondary | ICD-10-CM | POA: Diagnosis not present

## 2016-10-12 DIAGNOSIS — E1122 Type 2 diabetes mellitus with diabetic chronic kidney disease: Secondary | ICD-10-CM | POA: Diagnosis present

## 2016-10-12 DIAGNOSIS — M25551 Pain in right hip: Secondary | ICD-10-CM | POA: Diagnosis not present

## 2016-10-12 DIAGNOSIS — Z96643 Presence of artificial hip joint, bilateral: Secondary | ICD-10-CM | POA: Diagnosis not present

## 2016-10-12 DIAGNOSIS — Z886 Allergy status to analgesic agent status: Secondary | ICD-10-CM

## 2016-10-12 DIAGNOSIS — Z888 Allergy status to other drugs, medicaments and biological substances status: Secondary | ICD-10-CM | POA: Diagnosis not present

## 2016-10-12 DIAGNOSIS — Z96649 Presence of unspecified artificial hip joint: Secondary | ICD-10-CM | POA: Diagnosis not present

## 2016-10-12 DIAGNOSIS — Z96641 Presence of right artificial hip joint: Secondary | ICD-10-CM | POA: Diagnosis present

## 2016-10-12 DIAGNOSIS — H9190 Unspecified hearing loss, unspecified ear: Secondary | ICD-10-CM | POA: Diagnosis present

## 2016-10-12 DIAGNOSIS — I130 Hypertensive heart and chronic kidney disease with heart failure and stage 1 through stage 4 chronic kidney disease, or unspecified chronic kidney disease: Secondary | ICD-10-CM | POA: Diagnosis present

## 2016-10-12 DIAGNOSIS — M25451 Effusion, right hip: Secondary | ICD-10-CM | POA: Diagnosis present

## 2016-10-12 DIAGNOSIS — M25559 Pain in unspecified hip: Secondary | ICD-10-CM | POA: Diagnosis present

## 2016-10-12 DIAGNOSIS — M25051 Hemarthrosis, right hip: Principal | ICD-10-CM | POA: Diagnosis present

## 2016-10-12 DIAGNOSIS — I5032 Chronic diastolic (congestive) heart failure: Secondary | ICD-10-CM | POA: Diagnosis present

## 2016-10-12 DIAGNOSIS — F419 Anxiety disorder, unspecified: Secondary | ICD-10-CM | POA: Diagnosis present

## 2016-10-12 DIAGNOSIS — Z951 Presence of aortocoronary bypass graft: Secondary | ICD-10-CM

## 2016-10-12 DIAGNOSIS — M2500 Hemarthrosis, unspecified joint: Secondary | ICD-10-CM | POA: Diagnosis present

## 2016-10-12 DIAGNOSIS — E785 Hyperlipidemia, unspecified: Secondary | ICD-10-CM | POA: Diagnosis present

## 2016-10-12 DIAGNOSIS — Z950 Presence of cardiac pacemaker: Secondary | ICD-10-CM | POA: Diagnosis not present

## 2016-10-12 DIAGNOSIS — I4891 Unspecified atrial fibrillation: Secondary | ICD-10-CM | POA: Diagnosis not present

## 2016-10-12 DIAGNOSIS — M009 Pyogenic arthritis, unspecified: Secondary | ICD-10-CM | POA: Diagnosis not present

## 2016-10-12 DIAGNOSIS — G8929 Other chronic pain: Secondary | ICD-10-CM | POA: Diagnosis not present

## 2016-10-12 DIAGNOSIS — L02415 Cutaneous abscess of right lower limb: Secondary | ICD-10-CM | POA: Diagnosis not present

## 2016-10-12 DIAGNOSIS — M25552 Pain in left hip: Secondary | ICD-10-CM | POA: Diagnosis not present

## 2016-10-12 DIAGNOSIS — N184 Chronic kidney disease, stage 4 (severe): Secondary | ICD-10-CM

## 2016-10-12 DIAGNOSIS — I340 Nonrheumatic mitral (valve) insufficiency: Secondary | ICD-10-CM | POA: Diagnosis not present

## 2016-10-12 LAB — BASIC METABOLIC PANEL
Anion gap: 6 (ref 5–15)
BUN: 35 mg/dL — ABNORMAL HIGH (ref 6–20)
CALCIUM: 7.9 mg/dL — AB (ref 8.9–10.3)
CO2: 21 mmol/L — AB (ref 22–32)
CREATININE: 1.32 mg/dL — AB (ref 0.44–1.00)
Chloride: 110 mmol/L (ref 101–111)
GFR, EST AFRICAN AMERICAN: 44 mL/min — AB (ref >60)
GFR, EST NON AFRICAN AMERICAN: 38 mL/min — AB (ref >60)
GLUCOSE: 148 mg/dL — AB (ref 65–99)
Potassium: 4.2 mmol/L (ref 3.5–5.1)
Sodium: 137 mmol/L (ref 135–145)

## 2016-10-12 LAB — CBC WITH DIFFERENTIAL/PLATELET
BASOS ABS: 0.1 10*3/uL (ref 0–0.1)
BASOS PCT: 1 %
EOS PCT: 1 %
Eosinophils Absolute: 0.2 10*3/uL (ref 0–0.7)
HEMATOCRIT: 29 % — AB (ref 35.0–47.0)
Hemoglobin: 9.8 g/dL — ABNORMAL LOW (ref 12.0–16.0)
Lymphocytes Relative: 10 %
Lymphs Abs: 2 10*3/uL (ref 1.0–3.6)
MCH: 31.4 pg (ref 26.0–34.0)
MCHC: 33.9 g/dL (ref 32.0–36.0)
MCV: 92.8 fL (ref 80.0–100.0)
MONO ABS: 1.8 10*3/uL — AB (ref 0.2–0.9)
MONOS PCT: 8 %
NEUTROS ABS: 16.8 10*3/uL — AB (ref 1.4–6.5)
Neutrophils Relative %: 80 %
PLATELETS: 165 10*3/uL (ref 150–440)
RBC: 3.12 MIL/uL — ABNORMAL LOW (ref 3.80–5.20)
RDW: 14.4 % (ref 11.5–14.5)
WBC: 20.9 10*3/uL — ABNORMAL HIGH (ref 3.6–11.0)

## 2016-10-12 LAB — APTT
aPTT: 48 seconds — ABNORMAL HIGH (ref 24–36)
aPTT: 81 seconds — ABNORMAL HIGH (ref 24–36)
aPTT: 39 seconds — ABNORMAL HIGH (ref 24–36)

## 2016-10-12 LAB — HEPARIN LEVEL (UNFRACTIONATED): Heparin Unfractionated: 1.74 IU/mL — ABNORMAL HIGH (ref 0.30–0.70)

## 2016-10-12 MED ORDER — HYDRALAZINE HCL 10 MG PO TABS: 10.0000 mg | ORAL_TABLET | Freq: Three times a day (TID) | ORAL | Status: DC

## 2016-10-12 MED ORDER — ACETAMINOPHEN 650 MG RE SUPP: 650.0000 mg | Freq: Four times a day (QID) | RECTAL | Status: DC | PRN

## 2016-10-12 MED ORDER — HYDRALAZINE HCL 10 MG PO TABS
10.0000 mg | ORAL_TABLET | Freq: Three times a day (TID) | ORAL | Status: DC
  Administered 2016-10-12 – 2016-10-14 (×6): 10 mg via ORAL
  Filled 2016-10-12 (×6): qty 1

## 2016-10-12 MED ORDER — PIPERACILLIN-TAZOBACTAM 3.375 G IVPB: 3.3750 g | Freq: Once | INTRAVENOUS | Status: DC

## 2016-10-12 MED ORDER — AMLODIPINE BESYLATE 10 MG PO TABS
10.0000 mg | ORAL_TABLET | Freq: Every day | ORAL | Status: DC
  Administered 2016-10-13 – 2016-10-14 (×2): 10 mg via ORAL
  Filled 2016-10-12: qty 1

## 2016-10-12 MED ORDER — HYDRALAZINE HCL 10 MG PO TABS
10.0000 mg | ORAL_TABLET | Freq: Three times a day (TID) | ORAL | Status: DC
  Filled 2016-10-12 (×2): qty 1

## 2016-10-12 MED ORDER — HYDROCODONE-ACETAMINOPHEN 7.5-325 MG PO TABS
1.0000 | ORAL_TABLET | ORAL | Status: DC | PRN
  Administered 2016-10-12: 2 via ORAL
  Filled 2016-10-12: qty 2

## 2016-10-12 MED ORDER — ACETAMINOPHEN 325 MG PO TABS
650.0000 mg | ORAL_TABLET | Freq: Four times a day (QID) | ORAL | Status: DC | PRN
Start: 2016-10-12 — End: 2017-01-11

## 2016-10-12 MED ORDER — ATORVASTATIN CALCIUM 20 MG PO TABS
20.0000 mg | ORAL_TABLET | Freq: Every evening | ORAL | Status: DC
  Administered 2016-10-12 – 2016-10-14 (×3): 20 mg via ORAL
  Filled 2016-10-12 (×3): qty 1

## 2016-10-12 MED ORDER — PIPERACILLIN-TAZOBACTAM 3.375 G IVPB: 3.3750 g | Freq: Three times a day (TID) | INTRAVENOUS | Status: DC

## 2016-10-12 MED ORDER — ONDANSETRON HCL 4 MG PO TABS: 4.0000 mg | ORAL_TABLET | Freq: Four times a day (QID) | ORAL | Status: DC | PRN

## 2016-10-12 MED ORDER — METOPROLOL SUCCINATE ER 100 MG PO TB24
100.0000 mg | ORAL_TABLET | Freq: Two times a day (BID) | ORAL | Status: DC
  Administered 2016-10-12 – 2016-10-14 (×4): 100 mg via ORAL
  Filled 2016-10-12 (×4): qty 1

## 2016-10-12 MED ORDER — ACETAMINOPHEN 325 MG PO TABS: 650.0000 mg | ORAL_TABLET | Freq: Four times a day (QID) | ORAL | Status: DC | PRN

## 2016-10-12 MED ORDER — OXYCODONE-ACETAMINOPHEN 5-325 MG PO TABS: 1.0000 | ORAL_TABLET | Freq: Four times a day (QID) | ORAL | Status: DC | PRN

## 2016-10-12 MED ORDER — ONDANSETRON HCL 4 MG/2ML IJ SOLN: 4.0000 mg | Freq: Four times a day (QID) | INTRAMUSCULAR | Status: DC | PRN

## 2016-10-12 MED ORDER — ONDANSETRON HCL 4 MG PO TABS: 4.0000 mg | ORAL_TABLET | Freq: Four times a day (QID) | ORAL | 0 refills | Status: DC | PRN

## 2016-10-12 MED ORDER — LORAZEPAM 0.5 MG PO TABS
0.5000 mg | ORAL_TABLET | Freq: Two times a day (BID) | ORAL | Status: DC | PRN
Start: 2016-10-12 — End: 2016-10-14

## 2016-10-12 MED ORDER — ISOSORBIDE MONONITRATE ER 60 MG PO TB24
120.0000 mg | ORAL_TABLET | Freq: Every day | ORAL | Status: DC
  Administered 2016-10-13 – 2016-10-14 (×2): 120 mg via ORAL
  Filled 2016-10-12 (×2): qty 2

## 2016-10-12 MED ORDER — OXYCODONE-ACETAMINOPHEN 5-325 MG PO TABS: 1.0000 | ORAL_TABLET | Freq: Four times a day (QID) | ORAL | 0 refills | Status: DC | PRN

## 2016-10-12 MED ORDER — MIDAZOLAM HCL 2 MG/2ML IJ SOLN
INTRAMUSCULAR | Status: AC
  Filled 2016-10-12: qty 2

## 2016-10-12 MED ORDER — HYDRALAZINE HCL 20 MG/ML IJ SOLN
10.0000 mg | INTRAMUSCULAR | Status: DC | PRN
  Administered 2016-10-12: 10 mg via INTRAVENOUS
  Filled 2016-10-12: qty 1

## 2016-10-12 MED ORDER — VANCOMYCIN HCL 10 G IV SOLR
1500.0000 mg | Freq: Once | INTRAVENOUS | Status: DC
  Filled 2016-10-12: qty 1500

## 2016-10-12 MED ORDER — FENTANYL CITRATE (PF) 100 MCG/2ML IJ SOLN
INTRAMUSCULAR | Status: AC
  Filled 2016-10-12: qty 2

## 2016-10-12 MED ORDER — ENALAPRILAT 1.25 MG/ML IV SOLN
0.6250 mg | Freq: Once | INTRAVENOUS | Status: AC
  Administered 2016-10-12: 0.625 mg via INTRAVENOUS
  Filled 2016-10-12: qty 0.5

## 2016-10-12 MED ORDER — PSYLLIUM 95 % PO PACK
1.0000 | PACK | Freq: Every day | ORAL | Status: DC
  Administered 2016-10-12 – 2016-10-13 (×2): 1 via ORAL
  Filled 2016-10-12 (×3): qty 1

## 2016-10-12 NOTE — Care Management (Signed)
Sixty one hours in observation- URCM following. Per MD note, plan for transfer to [facility] today under DR. Olin's care.
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
## 2016-10-12 NOTE — Progress Notes (Signed)
Pt returned from procedure. A little sleepy, but fine otherwise. Bandaid on aspiration site. No discharge or blood noted. VSS.

## 2016-10-12 NOTE — Discharge Instructions (Signed)
Transfer pt to [facility] via [facility] to Dr.Hijazi's service when bed is available

## 2016-10-12 NOTE — Progress Notes (Addendum)
Patient's BP uncontrolled throughout the shift. PRN Labetalol, one time dose of Hydralazine & Vasotec administered, per MD order. Slight improvement noted. 5 beat run of SVT captured on telemetry monitor, patient asymptomatic- MD aware. Heparin gtt currently infusing 9.71ml/hr, with dosing per pharmacy. Unsure of surgical intervention plan at this time. Patient's daughter previously stated she would like patient to transfer to [facility], if surgery for her mother was needed. Dr. Roland Rack called to speak to patient's daughter last night, however she was not present during night shift. Patient kept NPO, with possibility of surgery. Pain controlled with current medication orders. Increased noted in patient's WBC this morning, MD informed during phone conversation regarding patient's BP. Low grade fever during the night, however improved without medication. Will continue to monitor.

## 2016-10-12 NOTE — Progress Notes (Signed)
Subjective: The patient notes essentially no change in her hip symptoms as compared to yesterday. She has no new complaints today.   Objective: Vital signs in last 24 hours: Temp:  [99 F (37.2 C)-100.9 F (38.3 C)] 99.5 F (37.5 C) (08/13 0458) Pulse Rate:  [59-71] 59 (08/13 0605) Resp:  [18] 18 (08/13 0128) BP: (167-192)/(37-64) 167/37 (08/13 0605) SpO2:  [92 %-96 %] 92 % (08/13 0128) Weight:  [77.8 kg (171 lb 9.6 oz)] 77.8 kg (171 lb 9.6 oz) (08/13 0600)  Intake/Output from previous day: 08/12 0701 - 08/13 0700 In: 2642.8 [P.O.:240; I.V.:2402.8] Out: 500 [Urine:500] Intake/Output this shift: No intake/output data recorded.   Recent Labs  10/09/16 2112 10/11/16 0753 10/12/16 0036  HGB 13.7 9.7* 9.8*    Recent Labs  10/11/16 0753 10/12/16 0036  WBC 15.3* 20.9*  RBC 3.11* 3.12*  HCT 29.0* 29.0*  PLT 154 165    Recent Labs  10/10/16 1818 10/12/16 0036  NA 137 137  K 4.0 4.2  CL 108 110  CO2 23 21*  BUN 47* 35*  CREATININE 1.63* 1.32*  GLUCOSE 145* 148*  CALCIUM 7.8* 7.9*    Recent Labs  10/09/16 2112  INR 1.11    Physical Exam: Her physical examination findings are unchanged today as compared to yesterday. The swelling, warmth, and mild erythema persists around the lateral aspect of the right hip. She has mild tenderness to palpation over the lateral aspect of the hip. She is neurovascularly intact to her right lower extremity and foot.  Assessment: Probable septic joint status post revision right total hip arthroplasty.  Plan: I have spoken to Dr. Paralee Cancel, the patient's primary hip surgeon at [facility] in [place]. He has agreed to take the patient in transfer to [facility] later this afternoon, but requests that the hip aspiration be done first here as it is already has been scheduled and arranged. He requests that antibiotics not be started until after the hip aspiration unless the patient becomes acutely ill.  Thank  you for allowing to participate in the care of this most unfortunate woman. I will sign off at this time. Please let me know if I can be of any further help.   Marshall Cork Shariq Puig 10/12/2016, 7:53 AM
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
## 2016-10-12 NOTE — Progress Notes (Signed)
Dr. Alvan Dame notified of pt admission.Stacey Drain

## 2016-10-12 NOTE — Progress Notes (Signed)
Pt down for procedure. Tele removed. Heparin stopped at 1100. All meds given with sip of water.

## 2016-10-12 NOTE — Progress Notes (Signed)
Pt transferred via [facility] to [facility] for continued care. VSS. Pt A&O x4. Family present at time of transfer. All belongings taken with pt. Report given to [facility]. Sent on 2L O2. No fluids running. Tele removed.

## 2016-10-12 NOTE — Procedures (Signed)
Interventional Radiology Procedure Note  Procedure: US guided aspiration of right lateral thigh complex fluid collection yielding 90 mL dark blood.  A sample was sent for culture.  Complications: None  Estimated Blood Loss: <25 mL  Recommendations: - Return to room - Cx are pending  Signed,  Criselda Peaches, MD

## 2016-10-13 DIAGNOSIS — I10 Essential (primary) hypertension: Secondary | ICD-10-CM

## 2016-10-13 LAB — COMPREHENSIVE METABOLIC PANEL
ALK PHOS: 28 U/L — AB (ref 38–126)
ALT: 21 U/L (ref 14–54)
AST: 15 U/L (ref 15–41)
Albumin: 2.8 g/dL — ABNORMAL LOW (ref 3.5–5.0)
Anion gap: 7 (ref 5–15)
BILIRUBIN TOTAL: 0.8 mg/dL (ref 0.3–1.2)
BUN: 38 mg/dL — AB (ref 6–20)
CALCIUM: 8 mg/dL — AB (ref 8.9–10.3)
CHLORIDE: 110 mmol/L (ref 101–111)
CO2: 21 mmol/L — ABNORMAL LOW (ref 22–32)
CREATININE: 1.28 mg/dL — AB (ref 0.44–1.00)
GFR, EST AFRICAN AMERICAN: 45 mL/min — AB (ref >60)
GFR, EST NON AFRICAN AMERICAN: 39 mL/min — AB (ref >60)
Glucose, Bld: 116 mg/dL — ABNORMAL HIGH (ref 65–99)
Potassium: 4 mmol/L (ref 3.5–5.1)
Sodium: 138 mmol/L (ref 135–145)
TOTAL PROTEIN: 5.7 g/dL — AB (ref 6.5–8.1)

## 2016-10-13 LAB — CBC
HEMATOCRIT: 27.2 % — AB (ref 36.0–46.0)
Hemoglobin: 9.2 g/dL — ABNORMAL LOW (ref 12.0–15.0)
MCH: 31.9 pg (ref 26.0–34.0)
MCHC: 33.8 g/dL (ref 30.0–36.0)
MCV: 94.4 fL (ref 78.0–100.0)
Platelets: 151 10*3/uL (ref 150–400)
RBC: 2.88 MIL/uL — ABNORMAL LOW (ref 3.87–5.11)
RDW: 14.4 % (ref 11.5–15.5)
WBC: 13.5 10*3/uL — ABNORMAL HIGH (ref 4.0–10.5)

## 2016-10-13 LAB — TROPONIN I
TROPONIN I: 0.13 ng/mL — AB (ref <0.03)
Troponin I: 0.12 ng/mL (ref <0.03)

## 2016-10-13 LAB — MAGNESIUM: MAGNESIUM: 2 mg/dL (ref 1.7–2.4)

## 2016-10-13 MED ORDER — ALBUTEROL SULFATE (2.5 MG/3ML) 0.083% IN NEBU: 2.5000 mg | INHALATION_SOLUTION | RESPIRATORY_TRACT | Status: DC | PRN

## 2016-10-13 MED ORDER — IPRATROPIUM-ALBUTEROL 0.5-2.5 (3) MG/3ML IN SOLN
3.0000 mL | Freq: Four times a day (QID) | RESPIRATORY_TRACT | Status: DC
  Administered 2016-10-13 (×3): 3 mL via RESPIRATORY_TRACT
  Filled 2016-10-13 (×3): qty 3

## 2016-10-13 MED ORDER — IPRATROPIUM-ALBUTEROL 0.5-2.5 (3) MG/3ML IN SOLN
3.0000 mL | Freq: Three times a day (TID) | RESPIRATORY_TRACT | Status: DC
  Administered 2016-10-14: 3 mL via RESPIRATORY_TRACT
  Filled 2016-10-13 (×3): qty 3

## 2016-10-13 MED ORDER — DOCUSATE SODIUM 100 MG PO CAPS
100.0000 mg | ORAL_CAPSULE | Freq: Two times a day (BID) | ORAL | Status: DC
  Administered 2016-10-13 – 2016-10-14 (×3): 100 mg via ORAL
  Filled 2016-10-13 (×3): qty 1
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
## 2016-10-13 NOTE — Care Management Note (Signed)
Case Management Note  Patient Details  Name: Wanda Brooks MRN: [id number] Date of Birth: 1935/08/22  Subjective/Objective:  81 y/o f admitted w/hemarthrosis. From home. PT cons-await recc.                  Action/Plan:d/c plan home.   Expected Discharge Date:                  Expected Discharge Plan:  Home/Self Care  In-House Referral:     Discharge planning Services  CM Consult  Post Acute Care Choice:    Choice offered to:     DME Arranged:    DME Agency:     HH Arranged:    HH Agency:     Status of Service:  In process, will continue to follow  If discussed at Long Length of Stay Meetings, dates discussed:    Additional Comments:  Dessa Phi, RN 10/13/2016, 2:34 PM

## 2016-10-13 NOTE — Progress Notes (Signed)
Patient ID: Wanda Brooks, female   DOB: [birth year], 81 y.o.   MRN: [id number]  Transfer admission from [facility] for right hip pain 9 months after revision of right hip with concern for infection  Had an aspiration yesterday prior to transfer but was U/S guided lateral hip aspiration removing about 80-90 cc of blood  She states pain around hip better since aspiration performed  Daughter in room today  Exam: AFVSS Right thigh with significant swelling, brusing NVI Moves right hip better today after aspiration  Plan: At this point after discussing with patient and her daughter we will see how she does with PT today getting out of bed mobilizing If she does well then will plan to discharge home maybe tomorrow with follow up However if cultures from aspiration or pain worsens with activity then will plan to go to OR Wednesday or Thursday for evacuation of hematoma Will follow up in am

## 2016-10-13 NOTE — Progress Notes (Signed)
Patient had 12 beats of V-tach at 01:35am while sleeping. Vitals taken and MD on call was notified. New orders received for am labs. Will continue to monitor patient.

## 2016-10-13 NOTE — Progress Notes (Signed)
CRITICAL VALUE ALERT  Critical Value:  troponin  Date & Time Notied:  8/14 @ 1424  Provider Notified: Fanny Bien   Orders Received/Actions taken:

## 2016-10-13 NOTE — Progress Notes (Signed)
Pt called out for RN c/o chest pressure in mid chest rating 4/10.  VSS.  MD made aware, verbal order for 12 lead EKG.

## 2016-10-13 NOTE — Progress Notes (Addendum)
PROGRESS NOTE    Wanda Brooks  BHA:[id number] DOB: 1935/10/03 DOA: 10/12/2016 PCP: Jearld Fenton, NP  Brief Narrative:Wanda Brooks is a 81 y.o. female with medical history significant of chronic atrial fibrillation on Eliquis, CAD status post CABG, COPD/ongoing tobacco abuse, chronic kidney disease stage III, history of multiple SURGERIES on her right hip starting from 2005 with hip replacements, complicated by infections, removal of hip replacement, revisions etc. last surgery of right hip Revision in November 2017 by Dr.Olin, was admitted to [facility] on 8/11 with right hip pain and swelling she had a CT scan which was notable for moderate to large right hip joint effusion representing either infected joint or hemarthrosis and a subcutaneous heterogenous fluid collection overlying the lateral aspect of the right hip. She also had transient chest pain however with unremarkable EKG and minimally elevated troponin of 0.05-0.06, she was seen by cardiology and no further cardiac workup was recommended at this time. She was also seen by interventional radiology and underwent ultrasound-guided aspiration of the right lateral thigh fluid collection which yielded 90 mL of dark blood, the sample was sent for culture. Her case was discussed with Dr.Olin/ orthopedics, and she was transferred here for his recommendations.   Assessment & Plan: 1. Right hip hemarthrosis, less likely septic joint and adjoining subcutaneous fluid collection,  -8/13 underwent ultrasound-guided aspiration left lateral thigh fluid collection by interventional radiology at [facility], this yielded 90 mL of dark blood -Clinically appears less likely to be septic joint, stable without Abx -Follow-up aspirate cultures, no abd at this time, gram stain negative -Eliquis has been held due to hemarthrosis -Ortho Dr.Olin following -Blood cultures negative at outside hospital  2. Atypical chest pain -EKG without acute changes and  troponin minimally elevated at 0.05-0.07, flat trend, no ACS -seen by Cards at [facility] 8/13 prior to transfer, recommended ECHO and if stable no further workup at this time -Continue beta blocker, will get ECHO  3. CAD/CABG -continue BB/statin -no ACS, see above  4. Chronic atrial fibrillation -Chadsvasc score is greater than 3 -Holding anticoagulation due to hemarthrosis -Continue metoprolol  4. CKD3 -Creatinine around 1.5-1.6 at baseline -Stable, monitor  5. HTN -continue metoprolol, hydralazine, imdur   6. COPD -few wheezes this am, add duonebs, monitor   DVT prophylaxis: SCDs Code Status: Full Code  Family Communication: daughter at bedside Disposition Plan: home pending workup, ? 1-2days if cultures negative   Consultants:   Ortho Dr.Olin   Procedures:  8/13: ultrasound-guided aspiration of the right lateral thigh fluid collection which yielded 90 mL of dark blood  Subjective: Feels ok, some pain at R hip site, and some wheezes this am/cough Objective: Vitals:   10/12/16 2240 10/13/16 0152 10/13/16 0525 10/13/16 0847  BP: (!) 133/49 (!) 137/45 (!) 164/50   Pulse: 62 62 63 63  Resp: 18  18 18   Temp: 98.1 F (36.7 C)  99.3 F (37.4 C)   TempSrc: Oral  Oral   SpO2: 97%  94% 95%  Weight:      Height:        Intake/Output Summary (Last 24 hours) at 10/13/16 1209 Last data filed at 10/13/16 0530  Gross per 24 hour  Intake                0 ml  Output              700 ml  Net             -700 ml  Filed Weights   10/12/16 1630  Weight: 74.9 kg (165 lb 2 oz)    Examination:  General exam: Appears calm and comfortable  Respiratory system: Respiratory effort normal, few rocnhi and wheezes this am Cardiovascular system: S1 & S2 heard, RRR. No JVD, murmurs, rubs, gallops or clicks. Gastrointestinal system: Abdomen is nondistended, soft and nontender.Normal bowel sounds heard. Central nervous system: Alert and oriented. No focal neurological  deficits. Extremities: R hip with swelling/discoloration, painful RoM Skin: No rashes, lesions or ulcers Psychiatry: Judgement and insight appear normal. Mood & affect appropriate.     Data Reviewed:   CBC:  Recent Labs Lab 10/09/16 2112 10/11/16 0753 10/12/16 0036 10/13/16 0503  WBC 23.6* 15.3* 20.9* 13.5*  NEUTROABS 17.9*  --  16.8*  --   HGB 13.7 9.7* 9.8* 9.2*  HCT 40.6 29.0* 29.0* 27.2*  MCV 94.5 93.2 92.8 94.4  PLT 218 154 165 735   Basic Metabolic Panel:  Recent Labs Lab 10/09/16 2112 10/10/16 1818 10/12/16 0036 10/13/16 0503  NA 140 137 137 138  K 4.0 4.0 4.2 4.0  CL 105 108 110 110  CO2 25 23 21* 21*  GLUCOSE 129* 145* 148* 116*  BUN 51* 47* 35* 38*  CREATININE 1.60* 1.63* 1.32* 1.28*  CALCIUM 9.7 7.8* 7.9* 8.0*  MG  --  1.8  --  2.0   GFR: Estimated Creatinine Clearance: 36.7 mL/min (A) (by C-G formula based on SCr of 1.28 mg/dL (H)). Liver Function Tests:  Recent Labs Lab 10/09/16 2112 10/10/16 1818 10/13/16 0503  AST 31 19 15   ALT 53 36 21  ALKPHOS 47 32* 28*  BILITOT 0.8 0.9 0.8  PROT 7.4 5.3* 5.7*  ALBUMIN 4.3 2.9* 2.8*   No results for input(s): LIPASE, AMYLASE in the last 168 hours. No results for input(s): AMMONIA in the last 168 hours. Coagulation Profile:  Recent Labs Lab 10/09/16 2112  INR 1.11   Cardiac Enzymes:  Recent Labs Lab 10/09/16 2112 10/10/16 0342 10/10/16 0855 10/10/16 1452  TROPONINI 0.05* 0.06* 0.07* 0.07*   BNP (last 3 results)  Recent Labs  06/30/16 1026  PROBNP 139.0*   HbA1C: No results for input(s): HGBA1C in the last 72 hours. CBG: No results for input(s): GLUCAP in the last 168 hours. Lipid Profile: No results for input(s): CHOL, HDL, LDLCALC, TRIG, CHOLHDL, LDLDIRECT in the last 72 hours. Thyroid Function Tests: No results for input(s): TSH, T4TOTAL, FREET4, T3FREE, THYROIDAB in the last 72 hours. Anemia Panel: No results for input(s): VITAMINB12, FOLATE, FERRITIN, TIBC, IRON,  RETICCTPCT in the last 72 hours. Urine analysis:    Component Value Date/Time   COLORURINE YELLOW (A) 10/09/2016 2215   APPEARANCEUR HAZY (A) 10/09/2016 2215   LABSPEC 1.018 10/09/2016 2215   PHURINE 5.0 10/09/2016 2215   GLUCOSEU NEGATIVE 10/09/2016 2215   HGBUR NEGATIVE 10/09/2016 2215   BILIRUBINUR NEGATIVE 10/09/2016 [place] 10/09/2016 2215   PROTEINUR 100 (A) 10/09/2016 2215   NITRITE NEGATIVE 10/09/2016 2215   LEUKOCYTESUR TRACE (A) 10/09/2016 2215   Sepsis Labs: @LABRCNTIP (procalcitonin:4,lacticidven:4)  ) Recent Results (from the past 240 hour(s))  CULTURE, BLOOD (ROUTINE X 2) w Reflex to ID Panel     Status: None (Preliminary result)   Collection Time: 10/11/16 12:03 PM  Result Value Ref Range Status   Specimen Description BLOOD BLOOD RIGHT HAND  Final   Special Requests   Final    BOTTLES DRAWN AEROBIC AND ANAEROBIC Blood Culture results may not be optimal due to an  inadequate volume of blood received in culture bottles   Culture NO GROWTH 2 DAYS  Final   Report Status PENDING  Incomplete  CULTURE, BLOOD (ROUTINE X 2) w Reflex to ID Panel     Status: None (Preliminary result)   Collection Time: 10/11/16 12:25 PM  Result Value Ref Range Status   Specimen Description BLOOD BLOOD RIGHT FOREARM  Final   Special Requests   Final    BOTTLES DRAWN AEROBIC AND ANAEROBIC Blood Culture adequate volume   Culture NO GROWTH 2 DAYS  Final   Report Status PENDING  Incomplete  Body fluid culture     Status: None (Preliminary result)   Collection Time: 10/12/16  2:23 PM  Result Value Ref Range Status   Specimen Description HIP  Final   Special Requests RIGTH HIP DRAINAGE  Final   Gram Stain   Final    ABUNDANT WBC PRESENT, PREDOMINANTLY PMN NO ORGANISMS SEEN    Culture   Final    NO GROWTH < 24 HOURS Performed at [facility], [place] [address], [place], [place] [address]    Report Status PENDING  Incomplete         Radiology Studies: US  Aspiration  Result Date: 10/12/2016 INDICATION: 81 year old female with a subcutaneous complex fluid collection in the lateral aspect of the right thigh concerning for hematoma versus abscess. EXAM: Ultrasound-guided aspiration MEDICATIONS: The patient is currently admitted to the hospital and receiving intravenous antibiotics. The antibiotics were administered within an appropriate time frame prior to the initiation of the procedure. ANESTHESIA/SEDATION: None COMPLICATIONS: None immediate. PROCEDURE: Informed written consent was obtained from the patient after a thorough discussion of the procedural risks, benefits and alternatives. All questions were addressed. A timeout was performed prior to the initiation of the procedure. Ultrasound reveals a large complex fluid collection in the subcutaneous soft tissues of the lateral thigh subjacent to the patient's prior surgical incision. The overlying skin was prepped and draped in standard fashion using chlorhexidine skin prep. Local anesthesia was attained by infiltration with 1% lidocaine. A small dermatotomy was made. Under real-time sonographic guidance, a an 18 gauge trocar needle was advanced into the fluid collection. Aspiration yields dark maroon colored bloody fluid. No evidence of purulence. Approximately 90 mL was successfully aspirated with complete decompression of the complex fluid collection. IMPRESSION: Aspiration yields 90 mL of dark maroon colored bloody fluid most consistent with liquified hematoma. A sample was sent for culture. Electronically Signed   By: Jacqulynn Cadet M.D.   On: 10/12/2016 17:07        Scheduled Meds: . amLODipine  10 mg Oral Q breakfast  . atorvastatin  20 mg Oral QPM  . docusate sodium  100 mg Oral BID  . hydrALAZINE  10 mg Oral Q8H  . ipratropium-albuterol  3 mL Nebulization Q6H  . isosorbide mononitrate  120 mg Oral Q breakfast  . metoprolol succinate  100 mg Oral BID  . psyllium  1 packet Oral QHS    Continuous Infusions:   LOS: 1 day    Time spent: 78min    Domenic Polite, MD [facility] Pager [phone]  If 7PM-7AM, please contact night-coverage [email or web address] Password [facility] 10/13/2016, 12:09 PM

## 2016-10-14 ENCOUNTER — Inpatient Hospital Stay (HOSPITAL_COMMUNITY): Payer: Medicare Other

## 2016-10-14 DIAGNOSIS — M2500 Hemarthrosis, unspecified joint: Secondary | ICD-10-CM

## 2016-10-14 DIAGNOSIS — I340 Nonrheumatic mitral (valve) insufficiency: Secondary | ICD-10-CM

## 2016-10-14 LAB — BASIC METABOLIC PANEL
ANION GAP: 7 (ref 5–15)
BUN: 43 mg/dL — ABNORMAL HIGH (ref 6–20)
CALCIUM: 8.4 mg/dL — AB (ref 8.9–10.3)
CHLORIDE: 111 mmol/L (ref 101–111)
CO2: 21 mmol/L — AB (ref 22–32)
Creatinine, Ser: 1.42 mg/dL — ABNORMAL HIGH (ref 0.44–1.00)
GFR calc non Af Amer: 34 mL/min — ABNORMAL LOW (ref >60)
GFR, EST AFRICAN AMERICAN: 40 mL/min — AB (ref >60)
Glucose, Bld: 132 mg/dL — ABNORMAL HIGH (ref 65–99)
Potassium: 4.2 mmol/L (ref 3.5–5.1)
SODIUM: 139 mmol/L (ref 135–145)

## 2016-10-14 LAB — ECHOCARDIOGRAM COMPLETE
E/e' ratio: 19.81
FS: 27 % — AB (ref 28–44)
Height: 65 in
IVS/LV PW RATIO, ED: 1.2
LA ID, A-P, ES: 47.1 mm
LADIAMINDEX: 2.49 cm/m2
LEFT ATRIUM END SYS DIAM: 47.1 mm
LV PW d: 12.2 mm — AB (ref 0.6–1.1)
LV TDI E'LATERAL: 7.22
LV TDI E'MEDIAL: 6.91
LVEEAVG: 19.81
LVEEMED: 19.81
LVELAT: 7.22 cm/s
LVOT SV: 67.7 mL
LVOT VTI: 24.4 cm
LVOT area: 2.77 cm2
LVOT diameter: 18.8 mm
MV Peak grad: 8 mmHg
MVPKAVEL: 59 m/s
MVPKEVEL: 143 m/s
WEIGHTICAEL: 2680.79 [oz_av]

## 2016-10-14 LAB — CBC
HEMATOCRIT: 24.9 % — AB (ref 36.0–46.0)
HEMOGLOBIN: 8.5 g/dL — AB (ref 12.0–15.0)
MCH: 32.2 pg (ref 26.0–34.0)
MCHC: 34.1 g/dL (ref 30.0–36.0)
MCV: 94.3 fL (ref 78.0–100.0)
Platelets: 155 10*3/uL (ref 150–400)
RBC: 2.64 MIL/uL — ABNORMAL LOW (ref 3.87–5.11)
RDW: 14.6 % (ref 11.5–15.5)
WBC: 12.2 10*3/uL — AB (ref 4.0–10.5)

## 2016-10-14 MED ORDER — APIXABAN 5 MG PO TABS
5.0000 mg | ORAL_TABLET | Freq: Two times a day (BID) | ORAL | Status: DC
  Administered 2016-10-14: 5 mg via ORAL
  Filled 2016-10-14: qty 1

## 2016-10-14 MED ORDER — POLYETHYLENE GLYCOL 3350 17 G PO PACK: 17.0000 g | PACK | Freq: Every day | ORAL | 0 refills | Status: DC

## 2016-10-14 MED ORDER — POLYETHYLENE GLYCOL 3350 17 G PO PACK
17.0000 g | PACK | Freq: Every day | ORAL | Status: DC
  Administered 2016-10-14: 17 g via ORAL
  Filled 2016-10-14: qty 1

## 2016-10-14 MED ORDER — HYDRALAZINE HCL 10 MG PO TABS: 10.0000 mg | ORAL_TABLET | Freq: Three times a day (TID) | ORAL | 0 refills | Status: DC

## 2016-10-14 NOTE — Progress Notes (Signed)
Patient ID: Wanda Brooks, female   DOB: 11/05/1935, 80 y.o.   MRN: [id number]  Doing ok right now.  Reports doing well with therapy yesterday, able to get up and walk with assistance and walker  AF VSS  Right thigh with persistent swelling and bruising NVI  Cultures from aspiration negative thus far Given progress following the aspiration laterally fine with discharge to home with HHPT for safety and mobility RTC next week - already have an appointment with me  WBAT Resume eliquis for co-morbid issues Will follow progress and labs clinically

## 2016-10-14 NOTE — Care Management Note (Signed)
Case Management Note  Patient Details  Name: Wanda Brooks MRN: [id number] Date of Birth: March 30, 1935  Subjective/Objective:   PT recc HHPT-provided patient w/HHC agency list-used [facility] in past will use again-rep Tim aware of HHPT.                  Action/Plan:d/c plan home w/HHC.   Expected Discharge Date:                  Expected Discharge Plan:  [place]  In-House Referral:     Discharge planning Services  CM Consult  Post Acute Care Choice:    Choice offered to:  Patient  DME Arranged:    DME Agency:     HH Arranged:  PT [place]:  [facility] (formerly [employer])  Status of Service:  Completed, signed off  If discussed at [employer], dates discussed:    Additional Comments:  Dessa Phi, RN 10/14/2016, 2:19 PM

## 2016-10-14 NOTE — Progress Notes (Signed)
Physical Therapy Treatment Patient Details Name: Wanda Brooks MRN: [id number] DOB: March 24, 1935 Today's Date: 10/14/2016    History of Present Illness 81 yo female admitted with hemarthrosis R hip. S/P aspiration R lateral thigh 8/13 @ [place]. Hx of CAD, COPD, CHF, CABG, MI, DM, HTN, pacemaker, multiple R hip surgeries (recent revision 01/08/16), falls.    PT Comments    Progressing well with mobility. Minimal pain in R hip with activity. Pt c/o L hand/thumb pain on today (she is wearing a splint/brace). Daughter present during session. D/C plan is for home. Recommend HHPT f/u.    Follow Up Recommendations  Home health PT;Supervision/Assistance - 24 hour     Equipment Recommendations  None recommended by PT    Recommendations for Other Services       Precautions / Restrictions Precautions Precautions: Posterior Hip;Fall Restrictions Weight Bearing Restrictions: No RLE Weight Bearing: Weight bearing as tolerated    Mobility  Bed Mobility Overal bed mobility: Needs Assistance Bed Mobility: Supine to Sit;Sit to Supine     Supine to sit: Min guard;HOB elevated Sit to supine: Min guard;HOB elevated   General bed mobility comments: Close guard for safety. Pt insisted on performing task unassisted. Pt relied on bedrail.   Transfers Overall transfer level: Needs assistance Equipment used: Rolling walker (2 wheeled) Transfers: Sit to/from Stand Sit to Stand: Min guard;From elevated surface         General transfer comment: Close guard for safety. VCs hand placement.   Ambulation/Gait Ambulation/Gait assistance: Min guard Ambulation Distance (Feet): 40 Feet Assistive device: Rolling walker (2 wheeled) Gait Pattern/deviations: Step-through pattern;Decreased step length - right;Decreased stride length     General Gait Details: Close guard for safety. slow gait speed. Dyspnea 2/4. Pt c/o L hand bothering her more than R hip.   Stairs            Wheelchair  Mobility    Modified Rankin (Stroke Patients Only)       Balance                                            Cognition Arousal/Alertness: Awake/alert Behavior During Therapy: WFL for tasks assessed/performed Overall Cognitive Status: Within Functional Limits for tasks assessed                                        Exercises      General Comments        Pertinent Vitals/Pain Pain Assessment: 0-10 Pain Location: R hip/LE (2/10), L hand/thumb (3/10) back (3/10) Pain Descriptors / Indicators: Aching;Sore Pain Intervention(s): Limited activity within patient's tolerance    Home Living                      Prior Function            PT Goals (current goals can now be found in the care plan section) Progress towards PT goals: Progressing toward goals    Frequency    Min 3X/week      PT Plan Current plan remains appropriate    Co-evaluation              AM-PAC PT "6 Clicks" Daily Activity  Outcome Measure  Difficulty turning over in bed (including adjusting bedclothes, sheets and blankets)?: A  Lot Difficulty moving from lying on back to sitting on the side of the bed? : A Lot Difficulty sitting down on and standing up from a chair with arms (e.g., wheelchair, bedside commode, etc,.)?: A Lot Help needed moving to and from a bed to chair (including a wheelchair)?: A Little Help needed walking in hospital room?: A Little Help needed climbing 3-5 steps with a railing? : A Little 6 Click Score: 15    End of Session Equipment Utilized During Treatment: Gait belt Activity Tolerance: Patient limited by pain Patient left: in bed;with call bell/phone within reach;with bed alarm set;with family/visitor present   PT Visit Diagnosis: Muscle weakness (generalized) (M62.81);Difficulty in walking, not elsewhere classified (R26.2)     Time: 5830-9407 PT Time Calculation (min) (ACUTE ONLY): 8 min  Charges:  $Gait  Training: 8-22 mins                    G Codes:          Weston Anna, MPT Pager: [phone]

## 2016-10-14 NOTE — Care Management (Signed)
ED CM received a call from patient's nurse, Joellen Jersey, stating an additional service was added to the home health order. CM faxed the order to [facility] and received confirmation. Venita Sheffield RN CCM

## 2016-10-14 NOTE — Discharge Summary (Signed)
Physician Discharge Summary  Wanda Brooks GUR:[id number] DOB: 16-May-1935 DOA: 10/12/2016  PCP: Jearld Fenton, NP  Admit date: 10/12/2016 Discharge date: 10/14/2016  Admitted From: Home Disposition:  Home  Recommendations for Outpatient Follow-up:  1. Follow up with PCP in 1 week 2. Follow up with Orthopedic surgery next week as scheduled  3. Please obtain BMP/CBC in 1 week  4. Please follow up on the following pending results: final aspirate culture results   Home Health: PT   Equipment/Devices: None  Discharge Condition: Stable CODE STATUS: Full  Diet recommendation: Heart healthy   Brief/Interim Summary: Wanda Brooks a 81 y.o.femalewith medical history significant of chronic atrial fibrillation on Eliquis, CAD status post CABG, COPD/ongoing tobacco abuse, chronic kidney disease stage III, history of multiple SURGERIES on her right hip starting from 2005 with hip replacements, complicated by infections, removal of hip replacement, revisions etc. last surgery of right hip revision in November 2017 by Dr.Olin, was admitted to [facility] on 8/11 with right hip pain and swelling she had a CT scan which was notable for moderate to large right hip joint effusion representing either infected joint or hemarthrosis and a subcutaneous heterogenous fluid collection overlying the lateral aspect of the right hip.She also had transient chest pain however with unremarkable EKG and minimally elevated troponin of 0.05-0.06, she was seen by cardiology and no further cardiac workup was recommended at this time. She was also seen by interventional radiology and underwent ultrasound-guided aspiration of the right lateral thigh fluid collection which yielded 90 mL of dark blood, the sample was sent for culture. Her case was discussed with Dr.Olin/orthopedics, and she was transferred to [facility] per his recommendations.  Discharge Diagnoses:  Principal Problem:    Hemarthrosis Active Problems:   Chronic atrial fibrillation (HCC)   Type 2 diabetes mellitus without complication, without long-term current use of insulin (HCC)   Essential hypertension   Chronic obstructive pulmonary disease (HCC)   CKD (chronic kidney disease), stage III   Recurrent dislocation of right hip   S/P revision right TH   Hip pain   1. Right hip hemarthrosis, less likely septic joint and adjoining subcutaneous fluid collection -8/13 underwent ultrasound-guided aspiration left lateral thigh fluid collection by interventional radiology at [facility], this yielded 90 mL of dark blood -Clinically appears less likely to be septic joint, stable without Abx -Follow-up aspirate cultures -Eliquisresumed  -Ortho Dr.Olin following, follow up as outpatient   2. Atypical chest pain -EKG without acute changes and troponin minimally elevated at 0.05-0.07, flat trend, no ACS -Seen by Cards at [facility] 8/13 prior to transfer, recommended echo and if stable no further workup at this time. Echocardiogram with normal wall motion, no regional wall motion abnormalities. -CP resolved this morning   3. CAD/CABG -Continue BB/statin  4. Chronic atrial fibrillation -Chadsvasc score is greater than 3 -Resume eliquis  -Continue metoprolol  4. CKD3 -Creatinine around 1.5-1.6 at baseline -Stable, monitor  5. HTN -Continue metoprolol, hydralazine, imdur   6. COPD -Stable   Discharge Instructions  Discharge Instructions    Call MD for:  difficulty breathing, headache or visual disturbances    Complete by:  As directed    Call MD for:  extreme fatigue    Complete by:  As directed    Call MD for:  hives    Complete by:  As directed    Call MD for:  persistant dizziness or light-headedness    Complete by:  As directed    Call  MD for:  persistant nausea and vomiting    Complete by:  As directed    Call MD for:  severe uncontrolled pain    Complete by:  As directed    Call MD for:   temperature >100.4    Complete by:  As directed    Diet - low sodium heart healthy    Complete by:  As directed    Discharge instructions    Complete by:  As directed    You were cared for by a hospitalist during your hospital stay. If you have any questions about your discharge medications or the care you received while you were in the hospital after you are discharged, you can call the unit and asked to speak with the hospitalist on call if the hospitalist that took care of you is not available. Once you are discharged, your primary care physician will handle any further medical issues. Please note that NO REFILLS for any discharge medications will be authorized once you are discharged, as it is imperative that you return to your primary care physician (or establish a relationship with a primary care physician if you do not have one) for your aftercare needs so that they can reassess your need for medications and monitor your lab values.   Increase activity slowly    Complete by:  As directed      Allergies as of 10/14/2016      Reactions   Aspirin Other (See Comments)   Stomach burning  Can only take coated   Daypro [oxaprozin] Other (See Comments)   Dizziness Affects driving   Multaq [dronedarone] Diarrhea      Medication List    STOP taking these medications   piperacillin-tazobactam 3.375 GM/50ML IVPB Commonly known as:  ZOSYN     TAKE these medications   acetaminophen 325 MG tablet Commonly known as:  TYLENOL Take 2 tablets (650 mg total) by mouth every 6 (six) hours as needed for mild pain (or Fever >/= 101). What changed:  how much to take   amLODipine 10 MG tablet Commonly known as:  NORVASC Take 1 tablet (10 mg total) by mouth daily with breakfast.   atorvastatin 20 MG tablet Commonly known as:  LIPITOR Take 1 tablet (20 mg total) by mouth every evening.   cetirizine 10 MG tablet Commonly known as:  ZYRTEC Take 10 mg by mouth daily as needed for allergies.    cholecalciferol 1000 units tablet Commonly known as:  VITAMIN D Take 1,000 Units by mouth daily.   docusate sodium 100 MG capsule Commonly known as:  COLACE Take 100 mg by mouth 2 (two) times daily.   ELIQUIS 5 MG Tabs tablet Generic drug:  apixaban Take 5 mg by mouth 2 (two) times daily.   hydrALAZINE 10 MG tablet Commonly known as:  APRESOLINE Take 1 tablet (10 mg total) by mouth every 8 (eight) hours.   hydrochlorothiazide 12.5 MG capsule Commonly known as:  MICROZIDE Take 1 capsule (12.5 mg total) by mouth daily.   HYDROcodone-acetaminophen 7.5-325 MG tablet Commonly known as:  NORCO Take 1-2 tablets by mouth every 4 (four) hours as needed for moderate pain.   isosorbide mononitrate 120 MG 24 hr tablet Commonly known as:  IMDUR Take 1 tablet (120 mg total) by mouth daily with breakfast.   LORazepam 0.5 MG tablet Commonly known as:  ATIVAN Take 1 tablet (0.5 mg total) by mouth every 12 (twelve) hours as needed for anxiety.   metoprolol succinate 100 MG  24 hr tablet Commonly known as:  TOPROL-XL Take 1 tablet (100 mg total) by mouth 2 (two) times daily.   ondansetron 4 MG tablet Commonly known as:  ZOFRAN Take 1 tablet (4 mg total) by mouth every 6 (six) hours as needed for nausea.   oxybutynin 5 MG 24 hr tablet Commonly known as:  DITROPAN-XL Take 1 tablet (5 mg total) by mouth at bedtime.   oxyCODONE-acetaminophen 5-325 MG tablet Commonly known as:  PERCOCET/ROXICET Take 1 tablet by mouth every 6 (six) hours as needed for severe pain.   polyethylene glycol packet Commonly known as:  MIRALAX / GLYCOLAX Take 17 g by mouth daily.   psyllium 95 % Pack Commonly known as:  HYDROCIL/METAMUCIL Take 1 packet by mouth at bedtime.   psyllium 0.52 g capsule Commonly known as:  REGULOID Take 0.52 g by mouth every morning.   triamcinolone cream 0.1 % Commonly known as:  KENALOG Apply 1 application topically 2 (two) times daily.      Follow-up Information     [facility] Follow up.   Specialty:  [place] Why:  [facility] physical therapy Contact information: [address] [place] [address] [phone]        Jearld Fenton, NP. Schedule an appointment as soon as possible for a visit in 1 week(s).   Specialty:  Internal Medicine Contact information: [place] [address] [phone]        Paralee Cancel, MD. Go to.   Specialty:  Orthopedic Surgery Contact information: [address] [place] [address] [phone]          Allergies  Allergen Reactions  . Aspirin Other (See Comments)    Stomach burning  Can only take coated  . Daypro [Oxaprozin] Other (See Comments)    Dizziness Affects driving  . Multaq [Dronedarone] Diarrhea    Consultations:  Orthopedic surgery    Procedures/Studies: Ct Hip Right Wo Contrast  Result Date: 10/09/2016 CLINICAL DATA:  Right hip pain and swelling since yesterday. Previous 4 surgeries on the right hip, last in November, 2017. EXAM: CT OF THE RIGHT HIP WITHOUT CONTRAST TECHNIQUE: Multidetector CT imaging of the right hip was performed according to the standard protocol. Multiplanar CT image reconstructions were also generated. 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 No significant change in the configuration of the protrusio deformity or definite fracture lucencies identified though due to streak artifacts from the patient's hip arthroplasty, subtle fractures can be obscured as a result. The prosthetic femoral head is centered within the acetabular component without evidence of asymmetric lining wear. No loosening of the fixation screws of the acetabular component is apparent. Bony demineralization is seen along the posterior aspect of the proximal femora along the femoral uncemented component.  Chronic linear lucency along the medial aspect of the proximal femur without cortical breakthrough is also again noted series 2, image 81. Of note is a new moderate to large right-sided joint effusion with hyperdense material within possibly representing a hemarthrosis. An infected joint is not excluded. There is a soft tissue subcutaneous collection overlying the lateral aspect of the right hip, also new in appearance and may reflect continuation of the joint fluid or potentially an overlying hematoma. This subcutaneous fluid collection measures 5.3 x 4.7 x 9.2 cm. Ligaments Suboptimally assessed by CT. Muscles and Tendons Mild gluteal atrophy.  No  intramuscular mass, hemorrhage or abscess. Soft tissues As above. IMPRESSION: 1. Moderate to large right hip joint effusion with heterogeneous material seen within either representing an infected joint or hemarthrosis. There is an ovoid subcutaneous heterogeneous fluid component overlying the lateral aspect of the right hip measuring 5.3 x 4.7 x 9.2 cm. Differential possibilities would include continuation of the joint effusion from the right hip or separate subcutaneous hematoma or potentially abscess. Percutaneous sampling of the joint and fluid collection may prove useful for further management. 2. Chronic protrusio the right acetabulum with bony demineralization of the acetabulum and proximal femur. No new fracture, hardware failure or dislocation is apparent. Electronically Signed   By: Ashley Royalty M.D.   On: 10/09/2016 20:40   US Aspiration  Result Date: 10/12/2016 INDICATION: 81 year old female with a subcutaneous complex fluid collection in the lateral aspect of the right thigh concerning for hematoma versus abscess. EXAM: Ultrasound-guided aspiration MEDICATIONS: The patient is currently admitted to the hospital and receiving intravenous antibiotics. The antibiotics were administered within an appropriate time frame prior to the initiation of the procedure.  ANESTHESIA/SEDATION: None COMPLICATIONS: None immediate. PROCEDURE: Informed written consent was obtained from the patient after a thorough discussion of the procedural risks, benefits and alternatives. All questions were addressed. A timeout was performed prior to the initiation of the procedure. Ultrasound reveals a large complex fluid collection in the subcutaneous soft tissues of the lateral thigh subjacent to the patient's prior surgical incision. The overlying skin was prepped and draped in standard fashion using chlorhexidine skin prep. Local anesthesia was attained by infiltration with 1% lidocaine. A small dermatotomy was made. Under real-time sonographic guidance, a an 18 gauge trocar needle was advanced into the fluid collection. Aspiration yields dark maroon colored bloody fluid. No evidence of purulence. Approximately 90 mL was successfully aspirated with complete decompression of the complex fluid collection. IMPRESSION: Aspiration yields 90 mL of dark maroon colored bloody fluid most consistent with liquified hematoma. A sample was sent for culture. Electronically Signed   By: Jacqulynn Cadet M.D.   On: 10/12/2016 17:07   Dg Chest Port 1 View  Result Date: 10/10/2016 CLINICAL DATA:  Cough.  History of COPD. EXAM: PORTABLE CHEST 1 VIEW COMPARISON:  Chest radiograph June 27, 2016 FINDINGS: Cardiac silhouette is mildly enlarged and unchanged. Calcified aortic knob. Mild chronic interstitial changes without pleural effusion or focal consolidation. Pulmonary vasculature is normal. No pneumothorax. Status post median sternotomy for CABG. Dual lead LEFT cardiac pacemaker in situ. Surgical material in the neck, present on prior examination. Inferior vena cava filter present. IMPRESSION: Mild cardiomegaly and chronic interstitial changes. Electronically Signed   By: Elon Alas M.D.   On: 10/10/2016 02:49   Dg Hip Unilat W Or Wo Pelvis 2-3 Views Right  Result Date: 10/09/2016 CLINICAL DATA:   Right hip pain for the past 1-2 days, worse this morning. No known injury. Previous hip replacement in 2017. EXAM: DG HIP (WITH OR WITHOUT PELVIS) 2-3V RIGHT COMPARISON:  01/07/2016. FINDINGS: Right total hip prosthesis with tree to acetabuli is unchanged in appearance. No fracture or dislocation. No evidence of interval prosthetic loosening. Diffuse osteopenia. Lower lumbar spine degenerative changes. IMPRESSION: Acute abnormality. Electronically Signed   By: Claudie Revering M.D.   On: 10/09/2016 16:59    Echo Left ventricle:  The cavity size was normal. Wall thickness was increased in a pattern of moderate LVH. Septal-lateral dyssynchrony suggestive of RV pacing noted. The estimated ejection fraction was 55%. Wall motion was normal; there  were no regional wall motion abnormalities. Features are consistent with a pseudonormal left ventricular filling pattern, with concomitant abnormal relaxation and increased filling pressure (grade 2 diastolic dysfunction).    Discharge Exam: Vitals:   10/14/16 1100 10/14/16 1411  BP: (!) 161/37   Pulse: 61   Resp: 18   Temp: 98.9 F (37.2 C)   SpO2: 92% 96%   Vitals:   10/13/16 2108 10/14/16 0500 10/14/16 1100 10/14/16 1411  BP: (!) 164/43  (!) 161/37   Pulse: 61  61   Resp: 18  18   Temp: 99.4 F (37.4 C)  98.9 F (37.2 C)   TempSrc: Oral  Oral   SpO2: 95%  92% 96%  Weight: 76 kg (167 lb 8.8 oz) 76 kg (167 lb 8.8 oz)    Height:        General: Pt is alert, awake, not in acute distress Cardiovascular: RRR, S1/S2 +, no rubs, no gallops Respiratory: CTA bilaterally, no wheezing, no rhonchi Abdominal: Soft, NT, ND, bowel sounds + Extremities: no edema, no cyanosis    The results of significant diagnostics from this hospitalization (including imaging, microbiology, ancillary and laboratory) are listed below for reference.     Microbiology: Recent Results (from the past 240 hour(s))  CULTURE, BLOOD (ROUTINE X 2) w Reflex to ID Panel      Status: None (Preliminary result)   Collection Time: 10/11/16 12:03 PM  Result Value Ref Range Status   Specimen Description BLOOD BLOOD RIGHT HAND  Final   Special Requests   Final    BOTTLES DRAWN AEROBIC AND ANAEROBIC Blood Culture results may not be optimal due to an inadequate volume of blood received in culture bottles   Culture NO GROWTH 4 DAYS  Final   Report Status PENDING  Incomplete  CULTURE, BLOOD (ROUTINE X 2) w Reflex to ID Panel     Status: None (Preliminary result)   Collection Time: 10/11/16 12:25 PM  Result Value Ref Range Status   Specimen Description BLOOD BLOOD RIGHT FOREARM  Final   Special Requests   Final    BOTTLES DRAWN AEROBIC AND ANAEROBIC Blood Culture adequate volume   Culture NO GROWTH 4 DAYS  Final   Report Status PENDING  Incomplete  Body fluid culture     Status: None (Preliminary result)   Collection Time: 10/12/16  2:23 PM  Result Value Ref Range Status   Specimen Description HIP  Final   Special Requests RIGTH HIP DRAINAGE  Final   Gram Stain   Final    ABUNDANT WBC PRESENT, PREDOMINANTLY PMN NO ORGANISMS SEEN    Culture   Final    NO GROWTH 3 DAYS Performed at [facility], [address] [place], [place] [address]    Report Status PENDING  Incomplete     Labs: BNP (last 3 results)  Recent Labs  12/31/15 0643 06/27/16 0614  BNP 493.1* 0,093.8*   Basic Metabolic Panel:  Recent Labs Lab 10/09/16 2112 10/10/16 1818 10/12/16 0036 10/13/16 0503 10/14/16 0447  NA 140 137 137 138 139  K 4.0 4.0 4.2 4.0 4.2  CL 105 108 110 110 111  CO2 25 23 21* 21* 21*  GLUCOSE 129* 145* 148* 116* 132*  BUN 51* 47* 35* 38* 43*  CREATININE 1.60* 1.63* 1.32* 1.28* 1.42*  CALCIUM 9.7 7.8* 7.9* 8.0* 8.4*  MG  --  1.8  --  2.0  --    Liver Function Tests:  Recent Labs Lab 10/09/16 2112  10/10/16 1818 10/13/16 0503  AST 31 19 15   ALT 53 36 21  ALKPHOS 47 32* 28*  BILITOT 0.8 0.9 0.8  PROT 7.4 5.3* 5.7*  ALBUMIN 4.3 2.9* 2.8*   No  results for input(s): LIPASE, AMYLASE in the last 168 hours. No results for input(s): AMMONIA in the last 168 hours. CBC:  Recent Labs Lab 10/09/16 2112 10/11/16 0753 10/12/16 0036 10/13/16 0503 10/14/16 0447  WBC 23.6* 15.3* 20.9* 13.5* 12.2*  NEUTROABS 17.9*  --  16.8*  --   --   HGB 13.7 9.7* 9.8* 9.2* 8.5*  HCT 40.6 29.0* 29.0* 27.2* 24.9*  MCV 94.5 93.2 92.8 94.4 94.3  PLT 218 154 165 151 155   Cardiac Enzymes:  Recent Labs Lab 10/10/16 0342 10/10/16 0855 10/10/16 1452 10/13/16 1259 10/13/16 1822  TROPONINI 0.06* 0.07* 0.07* 0.13* 0.12*   BNP: Invalid input(s): POCBNP CBG: No results for input(s): GLUCAP in the last 168 hours. D-Dimer No results for input(s): DDIMER in the last 72 hours. Hgb A1c No results for input(s): HGBA1C in the last 72 hours. Lipid Profile No results for input(s): CHOL, HDL, LDLCALC, TRIG, CHOLHDL, LDLDIRECT in the last 72 hours. Thyroid function studies No results for input(s): TSH, T4TOTAL, T3FREE, THYROIDAB in the last 72 hours.  Invalid input(s): FREET3 Anemia work up No results for input(s): VITAMINB12, FOLATE, FERRITIN, TIBC, IRON, RETICCTPCT in the last 72 hours. Urinalysis    Component Value Date/Time   COLORURINE YELLOW (A) 10/09/2016 2215   APPEARANCEUR HAZY (A) 10/09/2016 2215   LABSPEC 1.018 10/09/2016 2215   PHURINE 5.0 10/09/2016 2215   GLUCOSEU NEGATIVE 10/09/2016 2215   HGBUR NEGATIVE 10/09/2016 2215   [place] NEGATIVE 10/09/2016 2215   KETONESUR NEGATIVE 10/09/2016 2215   PROTEINUR 100 (A) 10/09/2016 2215   NITRITE NEGATIVE 10/09/2016 2215   LEUKOCYTESUR TRACE (A) 10/09/2016 2215   Sepsis Labs Invalid input(s): PROCALCITONIN,  WBC,  LACTICIDVEN Microbiology Recent Results (from the past 240 hour(s))  CULTURE, BLOOD (ROUTINE X 2) w Reflex to ID Panel     Status: None (Preliminary result)   Collection Time: 10/11/16 12:03 PM  Result Value Ref Range Status   Specimen Description BLOOD BLOOD RIGHT HAND   Final   Special Requests   Final    BOTTLES DRAWN AEROBIC AND ANAEROBIC Blood Culture results may not be optimal due to an inadequate volume of blood received in culture bottles   Culture NO GROWTH 4 DAYS  Final   Report Status PENDING  Incomplete  CULTURE, BLOOD (ROUTINE X 2) w Reflex to ID Panel     Status: None (Preliminary result)   Collection Time: 10/11/16 12:25 PM  Result Value Ref Range Status   Specimen Description BLOOD BLOOD RIGHT FOREARM  Final   Special Requests   Final    BOTTLES DRAWN AEROBIC AND ANAEROBIC Blood Culture adequate volume   Culture NO GROWTH 4 DAYS  Final   Report Status PENDING  Incomplete  Body fluid culture     Status: None (Preliminary result)   Collection Time: 10/12/16  2:23 PM  Result Value Ref Range Status   Specimen Description HIP  Final   Special Requests RIGTH HIP DRAINAGE  Final   Gram Stain   Final    ABUNDANT WBC PRESENT, PREDOMINANTLY PMN NO ORGANISMS SEEN    Culture   Final    NO GROWTH 3 DAYS Performed at [facility], [address] [place], [place] [address]    Report Status PENDING  Incomplete     Time coordinating discharge: 40 minutes  SIGNED:  Dessa Phi, DO [facility] Pager [phone]  If 7PM-7AM, please contact night-coverage [email or web address] Password [email or web address] 10/15/2016, 2:42 PM

## 2016-10-14 NOTE — Progress Notes (Signed)
Went over all discharge paperwork with patient and daughter.  All questions answered.  Prescriptions called in to pharmacy, pt aware.  VSS.  HH needs set up.  Pt discharged via wheelchair.

## 2016-10-14 NOTE — Progress Notes (Signed)
  Echocardiogram 2D Echocardiogram has been performed.  Darlina Sicilian M 10/14/2016, 10:49 AM

## 2016-10-15 ENCOUNTER — Encounter: Payer: Self-pay | Admitting: *Deleted

## 2016-10-15 ENCOUNTER — Other Ambulatory Visit: Payer: Self-pay | Admitting: *Deleted

## 2016-10-15 ENCOUNTER — Telehealth: Payer: Self-pay | Admitting: *Deleted

## 2016-10-15 NOTE — Telephone Encounter (Signed)
Last Rx 08/27/2016. Last OV 05/2016

## 2016-10-15 NOTE — Telephone Encounter (Signed)
Transition Care Management Follow-up Telephone Call   Date discharged? 10/14/16   How have you been since you were released from the hospital? Good   Do you understand why you were in the hospital? yes   Do you understand the discharge instructions? yes   Where were you discharged to? Home   Items Reviewed:  Medications reviewed: yes  Allergies reviewed: yes  Dietary changes reviewed: yes  Referrals reviewed: yes   Functional Questionnaire:   Activities of Daily Living (ADLs):   She states they are independent in the following: ambulation, bathing and hygiene, feeding, continence, grooming, toileting and dressing States they require assistance with the following: None. Daughter is staying with pt and assisting   Any transportation issues/concerns?: no. Daughter will bring pt to appointment.   Any patient concerns? no   Confirmed importance and date/time of follow-up visits scheduled yes  Provider Appointment booked with Dr Garnette Gunner 10/19/16 3:45.  Confirmed with patient if condition begins to worsen call PCP or go to the ER.  Patient was given the office number and encouraged to call back with question or concerns.  : yes

## 2016-10-16 ENCOUNTER — Telehealth: Payer: Self-pay

## 2016-10-16 LAB — BODY FLUID CULTURE: CULTURE: NO GROWTH

## 2016-10-16 LAB — CULTURE, BLOOD (ROUTINE X 2)
CULTURE: NO GROWTH
CULTURE: NO GROWTH
Special Requests: ADEQUATE

## 2016-10-16 MED ORDER — HYDROCODONE-ACETAMINOPHEN 7.5-325 MG PO TABS: 1.0000 | ORAL_TABLET | ORAL | 0 refills | Status: DC | PRN

## 2016-10-16 NOTE — Telephone Encounter (Signed)
RX printed and signed and placed in MYD box 

## 2016-10-16 NOTE — Telephone Encounter (Signed)
[place] [facility] with [facility] left /vm requesting verbal order for [facility] skilled nurse to go out for eval. Pt was discharged from hospital on 10/15/16 and received orders to start care but skilled nursing was left off. Pt does want [facility] to be included. If gets order today [facility] nurse will go out today or tomorrow.Please advise.

## 2016-10-16 NOTE — Telephone Encounter (Signed)
Okay for [facility] nursing

## 2016-10-17 DIAGNOSIS — E1122 Type 2 diabetes mellitus with diabetic chronic kidney disease: Secondary | ICD-10-CM | POA: Diagnosis not present

## 2016-10-17 DIAGNOSIS — M25051 Hemarthrosis, right hip: Secondary | ICD-10-CM | POA: Diagnosis not present

## 2016-10-17 DIAGNOSIS — I5032 Chronic diastolic (congestive) heart failure: Secondary | ICD-10-CM | POA: Diagnosis not present

## 2016-10-17 DIAGNOSIS — N183 Chronic kidney disease, stage 3 (moderate): Secondary | ICD-10-CM | POA: Diagnosis not present

## 2016-10-17 DIAGNOSIS — I130 Hypertensive heart and chronic kidney disease with heart failure and stage 1 through stage 4 chronic kidney disease, or unspecified chronic kidney disease: Secondary | ICD-10-CM | POA: Diagnosis not present

## 2016-10-17 DIAGNOSIS — I482 Chronic atrial fibrillation: Secondary | ICD-10-CM | POA: Diagnosis not present

## 2016-10-19 ENCOUNTER — Encounter: Payer: Self-pay | Admitting: Internal Medicine

## 2016-10-19 ENCOUNTER — Ambulatory Visit (INDEPENDENT_AMBULATORY_CARE_PROVIDER_SITE_OTHER): Payer: Medicare Other | Admitting: Internal Medicine

## 2016-10-19 VITALS — BP 158/66 | HR 87 | Temp 97.8°F | Resp 18 | Ht 65.0 in | Wt 156.5 lb

## 2016-10-19 DIAGNOSIS — M25021 Hemarthrosis, right elbow: Secondary | ICD-10-CM

## 2016-10-19 DIAGNOSIS — I10 Essential (primary) hypertension: Secondary | ICD-10-CM

## 2016-10-19 NOTE — Telephone Encounter (Signed)
Pt has appt today for hosp f/u

## 2016-10-19 NOTE — Patient Instructions (Signed)
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
## 2016-10-19 NOTE — Telephone Encounter (Signed)
Verbal order given  

## 2016-10-20 ENCOUNTER — Telehealth: Payer: Self-pay

## 2016-10-20 DIAGNOSIS — E1122 Type 2 diabetes mellitus with diabetic chronic kidney disease: Secondary | ICD-10-CM | POA: Diagnosis not present

## 2016-10-20 DIAGNOSIS — I482 Chronic atrial fibrillation: Secondary | ICD-10-CM | POA: Diagnosis not present

## 2016-10-20 DIAGNOSIS — N183 Chronic kidney disease, stage 3 (moderate): Secondary | ICD-10-CM | POA: Diagnosis not present

## 2016-10-20 DIAGNOSIS — I130 Hypertensive heart and chronic kidney disease with heart failure and stage 1 through stage 4 chronic kidney disease, or unspecified chronic kidney disease: Secondary | ICD-10-CM | POA: Diagnosis not present

## 2016-10-20 DIAGNOSIS — M25051 Hemarthrosis, right hip: Secondary | ICD-10-CM | POA: Diagnosis not present

## 2016-10-20 DIAGNOSIS — I5032 Chronic diastolic (congestive) heart failure: Secondary | ICD-10-CM | POA: Diagnosis not present

## 2016-10-20 LAB — CBC
HCT: 31.6 % — ABNORMAL LOW (ref 36.0–46.0)
Hemoglobin: 10.4 g/dL — ABNORMAL LOW (ref 12.0–15.0)
MCHC: 33 g/dL (ref 30.0–36.0)
MCV: 97.3 fl (ref 78.0–100.0)
PLATELETS: 350 10*3/uL (ref 150.0–400.0)
RBC: 3.24 Mil/uL — ABNORMAL LOW (ref 3.87–5.11)
RDW: 15 % (ref 11.5–15.5)
WBC: 10.5 10*3/uL (ref 4.0–10.5)

## 2016-10-20 LAB — CUP PACEART REMOTE DEVICE CHECK
Battery Impedance: 1419 Ohm
Battery Voltage: 2.76 V
Brady Statistic AP VP Percent: 98 %
Brady Statistic AP VS Percent: 0 %
Brady Statistic AS VS Percent: 0 %
Implantable Lead Implant Date: 20130529
Implantable Lead Location: 753860
Implantable Lead Model: 4074
Implantable Lead Model: 4574
Lead Channel Impedance Value: 427 Ohm
Lead Channel Pacing Threshold Amplitude: 0.625 V
Lead Channel Setting Pacing Amplitude: 1.5 V
Lead Channel Setting Pacing Pulse Width: 1.25 ms
MDC IDC LEAD IMPLANT DT: 20130529
MDC IDC LEAD LOCATION: 753859
MDC IDC MSMT BATTERY REMAINING LONGEVITY: 30 mo
MDC IDC MSMT LEADCHNL RA PACING THRESHOLD PULSEWIDTH: 0.4 ms
MDC IDC MSMT LEADCHNL RV IMPEDANCE VALUE: 424 Ohm
MDC IDC PG IMPLANT DT: 20130529
MDC IDC SESS DTM: 20180724155330
MDC IDC SET LEADCHNL RV PACING AMPLITUDE: 2.5 V
MDC IDC SET LEADCHNL RV SENSING SENSITIVITY: 2 mV
MDC IDC STAT BRADY AS VP PERCENT: 2 %

## 2016-10-20 LAB — COMPREHENSIVE METABOLIC PANEL
ALBUMIN: 3.7 g/dL (ref 3.5–5.2)
ALK PHOS: 43 U/L (ref 39–117)
ALT: 15 U/L (ref 0–35)
AST: 19 U/L (ref 0–37)
BILIRUBIN TOTAL: 0.7 mg/dL (ref 0.2–1.2)
BUN: 25 mg/dL — AB (ref 6–23)
CALCIUM: 9.1 mg/dL (ref 8.4–10.5)
CO2: 27 mEq/L (ref 19–32)
CREATININE: 1.43 mg/dL — AB (ref 0.40–1.20)
Chloride: 106 mEq/L (ref 96–112)
GFR: 37.62 mL/min — ABNORMAL LOW (ref >60.00)
Glucose, Bld: 119 mg/dL — ABNORMAL HIGH (ref 70–99)
Potassium: 4.9 mEq/L (ref 3.5–5.1)
SODIUM: 141 meq/L (ref 135–145)
TOTAL PROTEIN: 7.4 g/dL (ref 6.0–8.3)

## 2016-10-20 NOTE — Telephone Encounter (Signed)
Wanda Brooks from [place] at [facility] asked for verbal orders for AFib Management 2x week for 2 weeks and 1 x a week for 4 weeks. [name] was next to me when I took the call and gave verbal orders.

## 2016-10-21 DIAGNOSIS — S52532D Colles' fracture of left radius, subsequent encounter for closed fracture with routine healing: Secondary | ICD-10-CM | POA: Diagnosis not present

## 2016-10-21 DIAGNOSIS — Z471 Aftercare following joint replacement surgery: Secondary | ICD-10-CM | POA: Diagnosis not present

## 2016-10-21 DIAGNOSIS — M79642 Pain in left hand: Secondary | ICD-10-CM | POA: Diagnosis not present

## 2016-10-21 DIAGNOSIS — Z96641 Presence of right artificial hip joint: Secondary | ICD-10-CM | POA: Diagnosis not present

## 2016-10-22 DIAGNOSIS — I130 Hypertensive heart and chronic kidney disease with heart failure and stage 1 through stage 4 chronic kidney disease, or unspecified chronic kidney disease: Secondary | ICD-10-CM | POA: Diagnosis not present

## 2016-10-22 DIAGNOSIS — N183 Chronic kidney disease, stage 3 (moderate): Secondary | ICD-10-CM | POA: Diagnosis not present

## 2016-10-22 DIAGNOSIS — E1122 Type 2 diabetes mellitus with diabetic chronic kidney disease: Secondary | ICD-10-CM | POA: Diagnosis not present

## 2016-10-22 DIAGNOSIS — M25051 Hemarthrosis, right hip: Secondary | ICD-10-CM | POA: Diagnosis not present

## 2016-10-22 DIAGNOSIS — I5032 Chronic diastolic (congestive) heart failure: Secondary | ICD-10-CM | POA: Diagnosis not present

## 2016-10-22 DIAGNOSIS — I482 Chronic atrial fibrillation: Secondary | ICD-10-CM | POA: Diagnosis not present

## 2016-10-23 DIAGNOSIS — E1122 Type 2 diabetes mellitus with diabetic chronic kidney disease: Secondary | ICD-10-CM | POA: Diagnosis not present

## 2016-10-23 DIAGNOSIS — N183 Chronic kidney disease, stage 3 (moderate): Secondary | ICD-10-CM | POA: Diagnosis not present

## 2016-10-23 DIAGNOSIS — I5032 Chronic diastolic (congestive) heart failure: Secondary | ICD-10-CM | POA: Diagnosis not present

## 2016-10-23 DIAGNOSIS — I482 Chronic atrial fibrillation: Secondary | ICD-10-CM | POA: Diagnosis not present

## 2016-10-23 DIAGNOSIS — M25051 Hemarthrosis, right hip: Secondary | ICD-10-CM | POA: Diagnosis not present

## 2016-10-23 DIAGNOSIS — I130 Hypertensive heart and chronic kidney disease with heart failure and stage 1 through stage 4 chronic kidney disease, or unspecified chronic kidney disease: Secondary | ICD-10-CM | POA: Diagnosis not present

## 2016-10-26 DIAGNOSIS — M25051 Hemarthrosis, right hip: Secondary | ICD-10-CM | POA: Diagnosis not present

## 2016-10-26 DIAGNOSIS — I5032 Chronic diastolic (congestive) heart failure: Secondary | ICD-10-CM | POA: Diagnosis not present

## 2016-10-26 DIAGNOSIS — I130 Hypertensive heart and chronic kidney disease with heart failure and stage 1 through stage 4 chronic kidney disease, or unspecified chronic kidney disease: Secondary | ICD-10-CM | POA: Diagnosis not present

## 2016-10-26 DIAGNOSIS — I482 Chronic atrial fibrillation: Secondary | ICD-10-CM | POA: Diagnosis not present

## 2016-10-26 DIAGNOSIS — N183 Chronic kidney disease, stage 3 (moderate): Secondary | ICD-10-CM | POA: Diagnosis not present

## 2016-10-26 DIAGNOSIS — E1122 Type 2 diabetes mellitus with diabetic chronic kidney disease: Secondary | ICD-10-CM | POA: Diagnosis not present

## 2016-10-27 DIAGNOSIS — I5032 Chronic diastolic (congestive) heart failure: Secondary | ICD-10-CM | POA: Diagnosis not present

## 2016-10-27 DIAGNOSIS — N183 Chronic kidney disease, stage 3 (moderate): Secondary | ICD-10-CM | POA: Diagnosis not present

## 2016-10-27 DIAGNOSIS — I482 Chronic atrial fibrillation: Secondary | ICD-10-CM | POA: Diagnosis not present

## 2016-10-27 DIAGNOSIS — I130 Hypertensive heart and chronic kidney disease with heart failure and stage 1 through stage 4 chronic kidney disease, or unspecified chronic kidney disease: Secondary | ICD-10-CM | POA: Diagnosis not present

## 2016-10-27 DIAGNOSIS — M25051 Hemarthrosis, right hip: Secondary | ICD-10-CM | POA: Diagnosis not present

## 2016-10-27 DIAGNOSIS — E1122 Type 2 diabetes mellitus with diabetic chronic kidney disease: Secondary | ICD-10-CM | POA: Diagnosis not present

## 2016-10-29 DIAGNOSIS — I130 Hypertensive heart and chronic kidney disease with heart failure and stage 1 through stage 4 chronic kidney disease, or unspecified chronic kidney disease: Secondary | ICD-10-CM | POA: Diagnosis not present

## 2016-10-29 DIAGNOSIS — N183 Chronic kidney disease, stage 3 (moderate): Secondary | ICD-10-CM | POA: Diagnosis not present

## 2016-10-29 DIAGNOSIS — I482 Chronic atrial fibrillation: Secondary | ICD-10-CM | POA: Diagnosis not present

## 2016-10-29 DIAGNOSIS — E1122 Type 2 diabetes mellitus with diabetic chronic kidney disease: Secondary | ICD-10-CM | POA: Diagnosis not present

## 2016-10-29 DIAGNOSIS — M25051 Hemarthrosis, right hip: Secondary | ICD-10-CM | POA: Diagnosis not present

## 2016-10-29 DIAGNOSIS — I5032 Chronic diastolic (congestive) heart failure: Secondary | ICD-10-CM | POA: Diagnosis not present

## 2016-11-03 DIAGNOSIS — I130 Hypertensive heart and chronic kidney disease with heart failure and stage 1 through stage 4 chronic kidney disease, or unspecified chronic kidney disease: Secondary | ICD-10-CM | POA: Diagnosis not present

## 2016-11-03 DIAGNOSIS — E1122 Type 2 diabetes mellitus with diabetic chronic kidney disease: Secondary | ICD-10-CM | POA: Diagnosis not present

## 2016-11-03 DIAGNOSIS — M25051 Hemarthrosis, right hip: Secondary | ICD-10-CM | POA: Diagnosis not present

## 2016-11-03 DIAGNOSIS — N183 Chronic kidney disease, stage 3 (moderate): Secondary | ICD-10-CM | POA: Diagnosis not present

## 2016-11-03 DIAGNOSIS — I5032 Chronic diastolic (congestive) heart failure: Secondary | ICD-10-CM | POA: Diagnosis not present

## 2016-11-03 DIAGNOSIS — I482 Chronic atrial fibrillation: Secondary | ICD-10-CM | POA: Diagnosis not present

## 2016-11-04 DIAGNOSIS — I130 Hypertensive heart and chronic kidney disease with heart failure and stage 1 through stage 4 chronic kidney disease, or unspecified chronic kidney disease: Secondary | ICD-10-CM | POA: Diagnosis not present

## 2016-11-04 DIAGNOSIS — I482 Chronic atrial fibrillation: Secondary | ICD-10-CM | POA: Diagnosis not present

## 2016-11-04 DIAGNOSIS — I5032 Chronic diastolic (congestive) heart failure: Secondary | ICD-10-CM | POA: Diagnosis not present

## 2016-11-04 DIAGNOSIS — M25051 Hemarthrosis, right hip: Secondary | ICD-10-CM | POA: Diagnosis not present

## 2016-11-04 DIAGNOSIS — N183 Chronic kidney disease, stage 3 (moderate): Secondary | ICD-10-CM | POA: Diagnosis not present

## 2016-11-04 DIAGNOSIS — E1122 Type 2 diabetes mellitus with diabetic chronic kidney disease: Secondary | ICD-10-CM | POA: Diagnosis not present

## 2016-11-05 DIAGNOSIS — N183 Chronic kidney disease, stage 3 (moderate): Secondary | ICD-10-CM | POA: Diagnosis not present

## 2016-11-05 DIAGNOSIS — I482 Chronic atrial fibrillation: Secondary | ICD-10-CM | POA: Diagnosis not present

## 2016-11-05 DIAGNOSIS — M25051 Hemarthrosis, right hip: Secondary | ICD-10-CM | POA: Diagnosis not present

## 2016-11-05 DIAGNOSIS — E1122 Type 2 diabetes mellitus with diabetic chronic kidney disease: Secondary | ICD-10-CM | POA: Diagnosis not present

## 2016-11-05 DIAGNOSIS — I5032 Chronic diastolic (congestive) heart failure: Secondary | ICD-10-CM | POA: Diagnosis not present

## 2016-11-05 DIAGNOSIS — I130 Hypertensive heart and chronic kidney disease with heart failure and stage 1 through stage 4 chronic kidney disease, or unspecified chronic kidney disease: Secondary | ICD-10-CM | POA: Diagnosis not present

## 2016-11-10 DIAGNOSIS — I5032 Chronic diastolic (congestive) heart failure: Secondary | ICD-10-CM | POA: Diagnosis not present

## 2016-11-10 DIAGNOSIS — I130 Hypertensive heart and chronic kidney disease with heart failure and stage 1 through stage 4 chronic kidney disease, or unspecified chronic kidney disease: Secondary | ICD-10-CM | POA: Diagnosis not present

## 2016-11-10 DIAGNOSIS — E1122 Type 2 diabetes mellitus with diabetic chronic kidney disease: Secondary | ICD-10-CM | POA: Diagnosis not present

## 2016-11-10 DIAGNOSIS — N183 Chronic kidney disease, stage 3 (moderate): Secondary | ICD-10-CM | POA: Diagnosis not present

## 2016-11-10 DIAGNOSIS — I482 Chronic atrial fibrillation: Secondary | ICD-10-CM | POA: Diagnosis not present

## 2016-11-10 DIAGNOSIS — M25051 Hemarthrosis, right hip: Secondary | ICD-10-CM | POA: Diagnosis not present

## 2016-11-11 DIAGNOSIS — N183 Chronic kidney disease, stage 3 (moderate): Secondary | ICD-10-CM | POA: Diagnosis not present

## 2016-11-11 DIAGNOSIS — I482 Chronic atrial fibrillation: Secondary | ICD-10-CM | POA: Diagnosis not present

## 2016-11-11 DIAGNOSIS — I5032 Chronic diastolic (congestive) heart failure: Secondary | ICD-10-CM | POA: Diagnosis not present

## 2016-11-11 DIAGNOSIS — E1122 Type 2 diabetes mellitus with diabetic chronic kidney disease: Secondary | ICD-10-CM | POA: Diagnosis not present

## 2016-11-11 DIAGNOSIS — I130 Hypertensive heart and chronic kidney disease with heart failure and stage 1 through stage 4 chronic kidney disease, or unspecified chronic kidney disease: Secondary | ICD-10-CM | POA: Diagnosis not present

## 2016-11-11 DIAGNOSIS — M25051 Hemarthrosis, right hip: Secondary | ICD-10-CM | POA: Diagnosis not present

## 2016-11-12 DIAGNOSIS — M25051 Hemarthrosis, right hip: Secondary | ICD-10-CM | POA: Diagnosis not present

## 2016-11-12 DIAGNOSIS — I130 Hypertensive heart and chronic kidney disease with heart failure and stage 1 through stage 4 chronic kidney disease, or unspecified chronic kidney disease: Secondary | ICD-10-CM | POA: Diagnosis not present

## 2016-11-12 DIAGNOSIS — N183 Chronic kidney disease, stage 3 (moderate): Secondary | ICD-10-CM | POA: Diagnosis not present

## 2016-11-12 DIAGNOSIS — E1122 Type 2 diabetes mellitus with diabetic chronic kidney disease: Secondary | ICD-10-CM | POA: Diagnosis not present

## 2016-11-12 DIAGNOSIS — I482 Chronic atrial fibrillation: Secondary | ICD-10-CM | POA: Diagnosis not present

## 2016-11-12 DIAGNOSIS — I5032 Chronic diastolic (congestive) heart failure: Secondary | ICD-10-CM | POA: Diagnosis not present

## 2016-11-16 DIAGNOSIS — N183 Chronic kidney disease, stage 3 (moderate): Secondary | ICD-10-CM | POA: Diagnosis not present

## 2016-11-16 DIAGNOSIS — E1122 Type 2 diabetes mellitus with diabetic chronic kidney disease: Secondary | ICD-10-CM | POA: Diagnosis not present

## 2016-11-16 DIAGNOSIS — M25051 Hemarthrosis, right hip: Secondary | ICD-10-CM | POA: Diagnosis not present

## 2016-11-16 DIAGNOSIS — I5032 Chronic diastolic (congestive) heart failure: Secondary | ICD-10-CM | POA: Diagnosis not present

## 2016-11-16 DIAGNOSIS — I130 Hypertensive heart and chronic kidney disease with heart failure and stage 1 through stage 4 chronic kidney disease, or unspecified chronic kidney disease: Secondary | ICD-10-CM | POA: Diagnosis not present

## 2016-11-16 DIAGNOSIS — I482 Chronic atrial fibrillation: Secondary | ICD-10-CM | POA: Diagnosis not present

## 2016-11-18 DIAGNOSIS — Z471 Aftercare following joint replacement surgery: Secondary | ICD-10-CM | POA: Diagnosis not present

## 2016-11-18 DIAGNOSIS — I482 Chronic atrial fibrillation: Secondary | ICD-10-CM | POA: Diagnosis not present

## 2016-11-18 DIAGNOSIS — I5032 Chronic diastolic (congestive) heart failure: Secondary | ICD-10-CM | POA: Diagnosis not present

## 2016-11-18 DIAGNOSIS — Z96641 Presence of right artificial hip joint: Secondary | ICD-10-CM | POA: Diagnosis not present

## 2016-11-18 DIAGNOSIS — E1122 Type 2 diabetes mellitus with diabetic chronic kidney disease: Secondary | ICD-10-CM | POA: Diagnosis not present

## 2016-11-18 DIAGNOSIS — I130 Hypertensive heart and chronic kidney disease with heart failure and stage 1 through stage 4 chronic kidney disease, or unspecified chronic kidney disease: Secondary | ICD-10-CM | POA: Diagnosis not present

## 2016-11-18 DIAGNOSIS — S62357D Nondisplaced fracture of shaft of fifth metacarpal bone, left hand, subsequent encounter for fracture with routine healing: Secondary | ICD-10-CM | POA: Diagnosis not present

## 2016-11-18 DIAGNOSIS — M25051 Hemarthrosis, right hip: Secondary | ICD-10-CM | POA: Diagnosis not present

## 2016-11-18 DIAGNOSIS — N183 Chronic kidney disease, stage 3 (moderate): Secondary | ICD-10-CM | POA: Diagnosis not present

## 2016-11-18 DIAGNOSIS — S52532D Colles' fracture of left radius, subsequent encounter for closed fracture with routine healing: Secondary | ICD-10-CM | POA: Diagnosis not present

## 2016-11-19 DIAGNOSIS — I130 Hypertensive heart and chronic kidney disease with heart failure and stage 1 through stage 4 chronic kidney disease, or unspecified chronic kidney disease: Secondary | ICD-10-CM | POA: Diagnosis not present

## 2016-11-19 DIAGNOSIS — I5032 Chronic diastolic (congestive) heart failure: Secondary | ICD-10-CM | POA: Diagnosis not present

## 2016-11-19 DIAGNOSIS — M25051 Hemarthrosis, right hip: Secondary | ICD-10-CM | POA: Diagnosis not present

## 2016-11-19 DIAGNOSIS — E1122 Type 2 diabetes mellitus with diabetic chronic kidney disease: Secondary | ICD-10-CM | POA: Diagnosis not present

## 2016-11-19 DIAGNOSIS — N183 Chronic kidney disease, stage 3 (moderate): Secondary | ICD-10-CM | POA: Diagnosis not present

## 2016-11-19 DIAGNOSIS — I482 Chronic atrial fibrillation: Secondary | ICD-10-CM | POA: Diagnosis not present

## 2016-11-24 DIAGNOSIS — E1122 Type 2 diabetes mellitus with diabetic chronic kidney disease: Secondary | ICD-10-CM | POA: Diagnosis not present

## 2016-11-24 DIAGNOSIS — I130 Hypertensive heart and chronic kidney disease with heart failure and stage 1 through stage 4 chronic kidney disease, or unspecified chronic kidney disease: Secondary | ICD-10-CM | POA: Diagnosis not present

## 2016-11-24 DIAGNOSIS — N183 Chronic kidney disease, stage 3 (moderate): Secondary | ICD-10-CM | POA: Diagnosis not present

## 2016-11-24 DIAGNOSIS — I5032 Chronic diastolic (congestive) heart failure: Secondary | ICD-10-CM | POA: Diagnosis not present

## 2016-11-24 DIAGNOSIS — M25051 Hemarthrosis, right hip: Secondary | ICD-10-CM | POA: Diagnosis not present

## 2016-11-24 DIAGNOSIS — I482 Chronic atrial fibrillation: Secondary | ICD-10-CM | POA: Diagnosis not present

## 2016-11-26 DIAGNOSIS — I130 Hypertensive heart and chronic kidney disease with heart failure and stage 1 through stage 4 chronic kidney disease, or unspecified chronic kidney disease: Secondary | ICD-10-CM | POA: Diagnosis not present

## 2016-11-26 DIAGNOSIS — M25051 Hemarthrosis, right hip: Secondary | ICD-10-CM | POA: Diagnosis not present

## 2016-11-26 DIAGNOSIS — I482 Chronic atrial fibrillation: Secondary | ICD-10-CM | POA: Diagnosis not present

## 2016-11-26 DIAGNOSIS — N183 Chronic kidney disease, stage 3 (moderate): Secondary | ICD-10-CM | POA: Diagnosis not present

## 2016-11-26 DIAGNOSIS — E1122 Type 2 diabetes mellitus with diabetic chronic kidney disease: Secondary | ICD-10-CM | POA: Diagnosis not present

## 2016-11-26 DIAGNOSIS — I5032 Chronic diastolic (congestive) heart failure: Secondary | ICD-10-CM | POA: Diagnosis not present

## 2016-12-01 DIAGNOSIS — E1122 Type 2 diabetes mellitus with diabetic chronic kidney disease: Secondary | ICD-10-CM | POA: Diagnosis not present

## 2016-12-01 DIAGNOSIS — M25051 Hemarthrosis, right hip: Secondary | ICD-10-CM | POA: Diagnosis not present

## 2016-12-01 DIAGNOSIS — I482 Chronic atrial fibrillation: Secondary | ICD-10-CM | POA: Diagnosis not present

## 2016-12-01 DIAGNOSIS — N183 Chronic kidney disease, stage 3 (moderate): Secondary | ICD-10-CM | POA: Diagnosis not present

## 2016-12-01 DIAGNOSIS — I130 Hypertensive heart and chronic kidney disease with heart failure and stage 1 through stage 4 chronic kidney disease, or unspecified chronic kidney disease: Secondary | ICD-10-CM | POA: Diagnosis not present

## 2016-12-01 DIAGNOSIS — I5032 Chronic diastolic (congestive) heart failure: Secondary | ICD-10-CM | POA: Diagnosis not present

## 2016-12-02 ENCOUNTER — Other Ambulatory Visit: Payer: Self-pay

## 2016-12-02 DIAGNOSIS — M25051 Hemarthrosis, right hip: Secondary | ICD-10-CM | POA: Diagnosis not present

## 2016-12-02 DIAGNOSIS — N183 Chronic kidney disease, stage 3 (moderate): Secondary | ICD-10-CM | POA: Diagnosis not present

## 2016-12-02 DIAGNOSIS — I130 Hypertensive heart and chronic kidney disease with heart failure and stage 1 through stage 4 chronic kidney disease, or unspecified chronic kidney disease: Secondary | ICD-10-CM | POA: Diagnosis not present

## 2016-12-02 DIAGNOSIS — I5032 Chronic diastolic (congestive) heart failure: Secondary | ICD-10-CM | POA: Diagnosis not present

## 2016-12-02 DIAGNOSIS — E1122 Type 2 diabetes mellitus with diabetic chronic kidney disease: Secondary | ICD-10-CM | POA: Diagnosis not present

## 2016-12-02 DIAGNOSIS — I482 Chronic atrial fibrillation: Secondary | ICD-10-CM | POA: Diagnosis not present

## 2016-12-02 NOTE — Telephone Encounter (Signed)
Pt left v/m requesting rx hydrocodone apap. Call when ready for pick up. Last printed # 60 on 10/16/16. Last seen 10/19/16.

## 2016-12-03 ENCOUNTER — Other Ambulatory Visit: Payer: Self-pay | Admitting: Internal Medicine

## 2016-12-03 ENCOUNTER — Ambulatory Visit (INDEPENDENT_AMBULATORY_CARE_PROVIDER_SITE_OTHER): Payer: Medicare Other | Admitting: Internal Medicine

## 2016-12-03 ENCOUNTER — Encounter: Payer: Self-pay | Admitting: Internal Medicine

## 2016-12-03 VITALS — BP 146/78 | HR 64 | Temp 98.1°F | Wt 146.8 lb

## 2016-12-03 DIAGNOSIS — I130 Hypertensive heart and chronic kidney disease with heart failure and stage 1 through stage 4 chronic kidney disease, or unspecified chronic kidney disease: Secondary | ICD-10-CM | POA: Diagnosis not present

## 2016-12-03 DIAGNOSIS — M25051 Hemarthrosis, right hip: Secondary | ICD-10-CM | POA: Diagnosis not present

## 2016-12-03 DIAGNOSIS — N9089 Other specified noninflammatory disorders of vulva and perineum: Secondary | ICD-10-CM

## 2016-12-03 DIAGNOSIS — Z23 Encounter for immunization: Secondary | ICD-10-CM | POA: Diagnosis not present

## 2016-12-03 DIAGNOSIS — I25810 Atherosclerosis of coronary artery bypass graft(s) without angina pectoris: Secondary | ICD-10-CM | POA: Diagnosis not present

## 2016-12-03 DIAGNOSIS — M25551 Pain in right hip: Secondary | ICD-10-CM

## 2016-12-03 DIAGNOSIS — N183 Chronic kidney disease, stage 3 (moderate): Secondary | ICD-10-CM | POA: Diagnosis not present

## 2016-12-03 DIAGNOSIS — I482 Chronic atrial fibrillation: Secondary | ICD-10-CM | POA: Diagnosis not present

## 2016-12-03 DIAGNOSIS — I5032 Chronic diastolic (congestive) heart failure: Secondary | ICD-10-CM | POA: Diagnosis not present

## 2016-12-03 DIAGNOSIS — E1122 Type 2 diabetes mellitus with diabetic chronic kidney disease: Secondary | ICD-10-CM | POA: Diagnosis not present

## 2016-12-03 MED ORDER — HYDRALAZINE HCL 10 MG PO TABS: 10.0000 mg | ORAL_TABLET | Freq: Three times a day (TID) | ORAL | 0 refills | Status: DC

## 2016-12-03 MED ORDER — CEPHALEXIN 250 MG PO CAPS: 250.0000 mg | ORAL_CAPSULE | Freq: Two times a day (BID) | ORAL | 0 refills | Status: DC

## 2016-12-03 MED ORDER — HYDROCODONE-ACETAMINOPHEN 7.5-325 MG PO TABS: 1.0000 | ORAL_TABLET | ORAL | 0 refills | Status: DC | PRN

## 2016-12-03 NOTE — Telephone Encounter (Signed)
Rx given to pt at [place] today

## 2016-12-03 NOTE — Telephone Encounter (Signed)
RX printed and signed and placed in MYD box 
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
## 2016-12-04 ENCOUNTER — Ambulatory Visit (INDEPENDENT_AMBULATORY_CARE_PROVIDER_SITE_OTHER)
Admission: RE | Admit: 2016-12-04 | Discharge: 2016-12-04 | Disposition: A | Discharge status: Home / Self Care | Payer: Medicare Other | Source: Ambulatory Visit | Attending: Internal Medicine | Admitting: Internal Medicine

## 2016-12-04 DIAGNOSIS — M25551 Pain in right hip: Secondary | ICD-10-CM

## 2016-12-04 NOTE — Telephone Encounter (Signed)
Request received for Eliquis 5mg ; pt is 81 yrs old, wt- 66.6kg, Crea-1.43 on 10/19/16, last seen by Dr. Caryl Comes on 06/23/16; will send in refill to requested pharmacy.

## 2016-12-04 NOTE — Patient Instructions (Signed)

## 2016-12-07 DIAGNOSIS — M25552 Pain in left hip: Secondary | ICD-10-CM | POA: Diagnosis not present

## 2016-12-07 DIAGNOSIS — Z471 Aftercare following joint replacement surgery: Secondary | ICD-10-CM | POA: Diagnosis not present

## 2016-12-07 DIAGNOSIS — Z96641 Presence of right artificial hip joint: Secondary | ICD-10-CM | POA: Diagnosis not present

## 2016-12-08 DIAGNOSIS — M25051 Hemarthrosis, right hip: Secondary | ICD-10-CM | POA: Diagnosis not present

## 2016-12-08 DIAGNOSIS — I130 Hypertensive heart and chronic kidney disease with heart failure and stage 1 through stage 4 chronic kidney disease, or unspecified chronic kidney disease: Secondary | ICD-10-CM | POA: Diagnosis not present

## 2016-12-08 DIAGNOSIS — N183 Chronic kidney disease, stage 3 (moderate): Secondary | ICD-10-CM | POA: Diagnosis not present

## 2016-12-08 DIAGNOSIS — I482 Chronic atrial fibrillation: Secondary | ICD-10-CM | POA: Diagnosis not present

## 2016-12-08 DIAGNOSIS — I5032 Chronic diastolic (congestive) heart failure: Secondary | ICD-10-CM | POA: Diagnosis not present

## 2016-12-08 DIAGNOSIS — E1122 Type 2 diabetes mellitus with diabetic chronic kidney disease: Secondary | ICD-10-CM | POA: Diagnosis not present

## 2016-12-09 DIAGNOSIS — I5032 Chronic diastolic (congestive) heart failure: Secondary | ICD-10-CM | POA: Diagnosis not present

## 2016-12-09 DIAGNOSIS — E1122 Type 2 diabetes mellitus with diabetic chronic kidney disease: Secondary | ICD-10-CM | POA: Diagnosis not present

## 2016-12-09 DIAGNOSIS — I130 Hypertensive heart and chronic kidney disease with heart failure and stage 1 through stage 4 chronic kidney disease, or unspecified chronic kidney disease: Secondary | ICD-10-CM | POA: Diagnosis not present

## 2016-12-09 DIAGNOSIS — I482 Chronic atrial fibrillation: Secondary | ICD-10-CM | POA: Diagnosis not present

## 2016-12-09 DIAGNOSIS — N183 Chronic kidney disease, stage 3 (moderate): Secondary | ICD-10-CM | POA: Diagnosis not present

## 2016-12-09 DIAGNOSIS — M25051 Hemarthrosis, right hip: Secondary | ICD-10-CM | POA: Diagnosis not present

## 2016-12-10 DIAGNOSIS — I130 Hypertensive heart and chronic kidney disease with heart failure and stage 1 through stage 4 chronic kidney disease, or unspecified chronic kidney disease: Secondary | ICD-10-CM | POA: Diagnosis not present

## 2016-12-10 DIAGNOSIS — E1122 Type 2 diabetes mellitus with diabetic chronic kidney disease: Secondary | ICD-10-CM | POA: Diagnosis not present

## 2016-12-10 DIAGNOSIS — M25051 Hemarthrosis, right hip: Secondary | ICD-10-CM | POA: Diagnosis not present

## 2016-12-10 DIAGNOSIS — N183 Chronic kidney disease, stage 3 (moderate): Secondary | ICD-10-CM | POA: Diagnosis not present

## 2016-12-10 DIAGNOSIS — I5032 Chronic diastolic (congestive) heart failure: Secondary | ICD-10-CM | POA: Diagnosis not present

## 2016-12-10 DIAGNOSIS — I482 Chronic atrial fibrillation: Secondary | ICD-10-CM | POA: Diagnosis not present

## 2016-12-15 DIAGNOSIS — I482 Chronic atrial fibrillation: Secondary | ICD-10-CM | POA: Diagnosis not present

## 2016-12-15 DIAGNOSIS — I5032 Chronic diastolic (congestive) heart failure: Secondary | ICD-10-CM | POA: Diagnosis not present

## 2016-12-15 DIAGNOSIS — I130 Hypertensive heart and chronic kidney disease with heart failure and stage 1 through stage 4 chronic kidney disease, or unspecified chronic kidney disease: Secondary | ICD-10-CM | POA: Diagnosis not present

## 2016-12-15 DIAGNOSIS — E1122 Type 2 diabetes mellitus with diabetic chronic kidney disease: Secondary | ICD-10-CM | POA: Diagnosis not present

## 2016-12-15 DIAGNOSIS — M25051 Hemarthrosis, right hip: Secondary | ICD-10-CM | POA: Diagnosis not present

## 2016-12-15 DIAGNOSIS — N183 Chronic kidney disease, stage 3 (moderate): Secondary | ICD-10-CM | POA: Diagnosis not present

## 2016-12-16 DIAGNOSIS — N183 Chronic kidney disease, stage 3 (moderate): Secondary | ICD-10-CM | POA: Diagnosis not present

## 2016-12-16 DIAGNOSIS — I5032 Chronic diastolic (congestive) heart failure: Secondary | ICD-10-CM | POA: Diagnosis not present

## 2016-12-16 DIAGNOSIS — I482 Chronic atrial fibrillation: Secondary | ICD-10-CM | POA: Diagnosis not present

## 2016-12-16 DIAGNOSIS — I130 Hypertensive heart and chronic kidney disease with heart failure and stage 1 through stage 4 chronic kidney disease, or unspecified chronic kidney disease: Secondary | ICD-10-CM | POA: Diagnosis not present

## 2016-12-16 DIAGNOSIS — M25051 Hemarthrosis, right hip: Secondary | ICD-10-CM | POA: Diagnosis not present

## 2016-12-16 DIAGNOSIS — E1122 Type 2 diabetes mellitus with diabetic chronic kidney disease: Secondary | ICD-10-CM | POA: Diagnosis not present

## 2016-12-17 DIAGNOSIS — N183 Chronic kidney disease, stage 3 (moderate): Secondary | ICD-10-CM | POA: Diagnosis not present

## 2016-12-17 DIAGNOSIS — I130 Hypertensive heart and chronic kidney disease with heart failure and stage 1 through stage 4 chronic kidney disease, or unspecified chronic kidney disease: Secondary | ICD-10-CM | POA: Diagnosis not present

## 2016-12-17 DIAGNOSIS — I482 Chronic atrial fibrillation: Secondary | ICD-10-CM | POA: Diagnosis not present

## 2016-12-17 DIAGNOSIS — M25051 Hemarthrosis, right hip: Secondary | ICD-10-CM | POA: Diagnosis not present

## 2016-12-17 DIAGNOSIS — I5032 Chronic diastolic (congestive) heart failure: Secondary | ICD-10-CM | POA: Diagnosis not present

## 2016-12-17 DIAGNOSIS — E1122 Type 2 diabetes mellitus with diabetic chronic kidney disease: Secondary | ICD-10-CM | POA: Diagnosis not present

## 2016-12-21 DIAGNOSIS — I482 Chronic atrial fibrillation: Secondary | ICD-10-CM | POA: Diagnosis not present

## 2016-12-21 DIAGNOSIS — E1122 Type 2 diabetes mellitus with diabetic chronic kidney disease: Secondary | ICD-10-CM | POA: Diagnosis not present

## 2016-12-21 DIAGNOSIS — N183 Chronic kidney disease, stage 3 (moderate): Secondary | ICD-10-CM | POA: Diagnosis not present

## 2016-12-21 DIAGNOSIS — I130 Hypertensive heart and chronic kidney disease with heart failure and stage 1 through stage 4 chronic kidney disease, or unspecified chronic kidney disease: Secondary | ICD-10-CM | POA: Diagnosis not present

## 2016-12-21 DIAGNOSIS — M25051 Hemarthrosis, right hip: Secondary | ICD-10-CM | POA: Diagnosis not present

## 2016-12-21 DIAGNOSIS — I5032 Chronic diastolic (congestive) heart failure: Secondary | ICD-10-CM | POA: Diagnosis not present

## 2016-12-22 ENCOUNTER — Ambulatory Visit: Payer: Medicare Other | Admitting: *Deleted

## 2016-12-24 ENCOUNTER — Encounter: Payer: Self-pay | Admitting: Cardiology

## 2016-12-24 NOTE — Progress Notes (Signed)
Not received  

## 2016-12-25 ENCOUNTER — Other Ambulatory Visit: Payer: Self-pay | Admitting: Internal Medicine

## 2016-12-25 DIAGNOSIS — I482 Chronic atrial fibrillation: Secondary | ICD-10-CM | POA: Diagnosis not present

## 2016-12-25 DIAGNOSIS — E1122 Type 2 diabetes mellitus with diabetic chronic kidney disease: Secondary | ICD-10-CM | POA: Diagnosis not present

## 2016-12-25 DIAGNOSIS — I130 Hypertensive heart and chronic kidney disease with heart failure and stage 1 through stage 4 chronic kidney disease, or unspecified chronic kidney disease: Secondary | ICD-10-CM | POA: Diagnosis not present

## 2016-12-25 DIAGNOSIS — N183 Chronic kidney disease, stage 3 (moderate): Secondary | ICD-10-CM | POA: Diagnosis not present

## 2016-12-25 DIAGNOSIS — I5032 Chronic diastolic (congestive) heart failure: Secondary | ICD-10-CM | POA: Diagnosis not present

## 2016-12-25 DIAGNOSIS — M25051 Hemarthrosis, right hip: Secondary | ICD-10-CM | POA: Diagnosis not present

## 2016-12-28 DIAGNOSIS — I5032 Chronic diastolic (congestive) heart failure: Secondary | ICD-10-CM | POA: Diagnosis not present

## 2016-12-28 DIAGNOSIS — I482 Chronic atrial fibrillation: Secondary | ICD-10-CM | POA: Diagnosis not present

## 2016-12-28 DIAGNOSIS — M25051 Hemarthrosis, right hip: Secondary | ICD-10-CM | POA: Diagnosis not present

## 2016-12-28 DIAGNOSIS — N183 Chronic kidney disease, stage 3 (moderate): Secondary | ICD-10-CM | POA: Diagnosis not present

## 2016-12-28 DIAGNOSIS — E1122 Type 2 diabetes mellitus with diabetic chronic kidney disease: Secondary | ICD-10-CM | POA: Diagnosis not present

## 2016-12-28 DIAGNOSIS — I130 Hypertensive heart and chronic kidney disease with heart failure and stage 1 through stage 4 chronic kidney disease, or unspecified chronic kidney disease: Secondary | ICD-10-CM | POA: Diagnosis not present

## 2016-12-29 ENCOUNTER — Other Ambulatory Visit: Payer: Self-pay | Admitting: Internal Medicine

## 2016-12-30 DIAGNOSIS — M25051 Hemarthrosis, right hip: Secondary | ICD-10-CM | POA: Diagnosis not present

## 2016-12-30 DIAGNOSIS — I482 Chronic atrial fibrillation: Secondary | ICD-10-CM | POA: Diagnosis not present

## 2016-12-30 DIAGNOSIS — I130 Hypertensive heart and chronic kidney disease with heart failure and stage 1 through stage 4 chronic kidney disease, or unspecified chronic kidney disease: Secondary | ICD-10-CM | POA: Diagnosis not present

## 2016-12-30 DIAGNOSIS — E1122 Type 2 diabetes mellitus with diabetic chronic kidney disease: Secondary | ICD-10-CM | POA: Diagnosis not present

## 2016-12-30 DIAGNOSIS — I5032 Chronic diastolic (congestive) heart failure: Secondary | ICD-10-CM | POA: Diagnosis not present

## 2016-12-30 DIAGNOSIS — N183 Chronic kidney disease, stage 3 (moderate): Secondary | ICD-10-CM | POA: Diagnosis not present

## 2016-12-30 NOTE — Telephone Encounter (Addendum)
Last filled 12/03/2016-- please advise

## 2017-01-05 DIAGNOSIS — I5032 Chronic diastolic (congestive) heart failure: Secondary | ICD-10-CM | POA: Diagnosis not present

## 2017-01-05 DIAGNOSIS — I130 Hypertensive heart and chronic kidney disease with heart failure and stage 1 through stage 4 chronic kidney disease, or unspecified chronic kidney disease: Secondary | ICD-10-CM | POA: Diagnosis not present

## 2017-01-05 DIAGNOSIS — Z09 Encounter for follow-up examination after completed treatment for conditions other than malignant neoplasm: Secondary | ICD-10-CM | POA: Diagnosis not present

## 2017-01-05 DIAGNOSIS — N183 Chronic kidney disease, stage 3 (moderate): Secondary | ICD-10-CM | POA: Diagnosis not present

## 2017-01-05 DIAGNOSIS — I482 Chronic atrial fibrillation: Secondary | ICD-10-CM | POA: Diagnosis not present

## 2017-01-05 DIAGNOSIS — Z96641 Presence of right artificial hip joint: Secondary | ICD-10-CM | POA: Diagnosis not present

## 2017-01-05 DIAGNOSIS — M25051 Hemarthrosis, right hip: Secondary | ICD-10-CM | POA: Diagnosis not present

## 2017-01-05 DIAGNOSIS — E1122 Type 2 diabetes mellitus with diabetic chronic kidney disease: Secondary | ICD-10-CM | POA: Diagnosis not present

## 2017-01-07 ENCOUNTER — Other Ambulatory Visit: Payer: Self-pay | Admitting: Orthopedic Surgery

## 2017-01-07 DIAGNOSIS — Z96641 Presence of right artificial hip joint: Secondary | ICD-10-CM

## 2017-01-11 ENCOUNTER — Other Ambulatory Visit: Payer: Self-pay

## 2017-01-11 ENCOUNTER — Observation Stay (HOSPITAL_COMMUNITY)
Admission: EM | Admit: 2017-01-11 | Discharge: 2017-01-13 | Disposition: A | Discharge status: Home / Self Care | Payer: Medicare Other | Attending: Orthopedic Surgery | Admitting: Orthopedic Surgery

## 2017-01-11 ENCOUNTER — Encounter (HOSPITAL_COMMUNITY): Payer: Self-pay

## 2017-01-11 ENCOUNTER — Emergency Department (HOSPITAL_COMMUNITY): Payer: Medicare Other

## 2017-01-11 DIAGNOSIS — F329 Major depressive disorder, single episode, unspecified: Secondary | ICD-10-CM | POA: Diagnosis not present

## 2017-01-11 DIAGNOSIS — M25451 Effusion, right hip: Secondary | ICD-10-CM | POA: Diagnosis not present

## 2017-01-11 DIAGNOSIS — I5032 Chronic diastolic (congestive) heart failure: Secondary | ICD-10-CM | POA: Diagnosis not present

## 2017-01-11 DIAGNOSIS — M1610 Unilateral primary osteoarthritis, unspecified hip: Secondary | ICD-10-CM | POA: Insufficient documentation

## 2017-01-11 DIAGNOSIS — M1990 Unspecified osteoarthritis, unspecified site: Secondary | ICD-10-CM | POA: Diagnosis not present

## 2017-01-11 DIAGNOSIS — F419 Anxiety disorder, unspecified: Secondary | ICD-10-CM | POA: Diagnosis not present

## 2017-01-11 DIAGNOSIS — E1122 Type 2 diabetes mellitus with diabetic chronic kidney disease: Secondary | ICD-10-CM | POA: Insufficient documentation

## 2017-01-11 DIAGNOSIS — I482 Chronic atrial fibrillation: Secondary | ICD-10-CM | POA: Diagnosis not present

## 2017-01-11 DIAGNOSIS — Z471 Aftercare following joint replacement surgery: Secondary | ICD-10-CM | POA: Diagnosis not present

## 2017-01-11 DIAGNOSIS — E7800 Pure hypercholesterolemia, unspecified: Secondary | ICD-10-CM | POA: Diagnosis not present

## 2017-01-11 DIAGNOSIS — Z7901 Long term (current) use of anticoagulants: Secondary | ICD-10-CM | POA: Insufficient documentation

## 2017-01-11 DIAGNOSIS — H9190 Unspecified hearing loss, unspecified ear: Secondary | ICD-10-CM | POA: Diagnosis not present

## 2017-01-11 DIAGNOSIS — I130 Hypertensive heart and chronic kidney disease with heart failure and stage 1 through stage 4 chronic kidney disease, or unspecified chronic kidney disease: Secondary | ICD-10-CM | POA: Insufficient documentation

## 2017-01-11 DIAGNOSIS — S7001XD Contusion of right hip, subsequent encounter: Secondary | ICD-10-CM | POA: Diagnosis not present

## 2017-01-11 DIAGNOSIS — F17210 Nicotine dependence, cigarettes, uncomplicated: Secondary | ICD-10-CM | POA: Insufficient documentation

## 2017-01-11 DIAGNOSIS — X58XXXA Exposure to other specified factors, initial encounter: Secondary | ICD-10-CM | POA: Insufficient documentation

## 2017-01-11 DIAGNOSIS — Z90710 Acquired absence of both cervix and uterus: Secondary | ICD-10-CM | POA: Diagnosis not present

## 2017-01-11 DIAGNOSIS — S7001XA Contusion of right hip, initial encounter: Principal | ICD-10-CM | POA: Diagnosis present

## 2017-01-11 DIAGNOSIS — I481 Persistent atrial fibrillation: Secondary | ICD-10-CM | POA: Insufficient documentation

## 2017-01-11 DIAGNOSIS — K219 Gastro-esophageal reflux disease without esophagitis: Secondary | ICD-10-CM | POA: Diagnosis not present

## 2017-01-11 DIAGNOSIS — Z79899 Other long term (current) drug therapy: Secondary | ICD-10-CM | POA: Insufficient documentation

## 2017-01-11 DIAGNOSIS — K449 Diaphragmatic hernia without obstruction or gangrene: Secondary | ICD-10-CM | POA: Diagnosis not present

## 2017-01-11 DIAGNOSIS — Z951 Presence of aortocoronary bypass graft: Secondary | ICD-10-CM | POA: Diagnosis not present

## 2017-01-11 DIAGNOSIS — J449 Chronic obstructive pulmonary disease, unspecified: Secondary | ICD-10-CM | POA: Diagnosis not present

## 2017-01-11 DIAGNOSIS — K5900 Constipation, unspecified: Secondary | ICD-10-CM | POA: Diagnosis not present

## 2017-01-11 DIAGNOSIS — Z96641 Presence of right artificial hip joint: Secondary | ICD-10-CM | POA: Diagnosis not present

## 2017-01-11 DIAGNOSIS — Z950 Presence of cardiac pacemaker: Secondary | ICD-10-CM | POA: Insufficient documentation

## 2017-01-11 DIAGNOSIS — N183 Chronic kidney disease, stage 3 (moderate): Secondary | ICD-10-CM | POA: Diagnosis not present

## 2017-01-11 DIAGNOSIS — Z888 Allergy status to other drugs, medicaments and biological substances status: Secondary | ICD-10-CM | POA: Insufficient documentation

## 2017-01-11 DIAGNOSIS — Z96651 Presence of right artificial knee joint: Secondary | ICD-10-CM | POA: Diagnosis not present

## 2017-01-11 DIAGNOSIS — E785 Hyperlipidemia, unspecified: Secondary | ICD-10-CM | POA: Insufficient documentation

## 2017-01-11 DIAGNOSIS — Z886 Allergy status to analgesic agent status: Secondary | ICD-10-CM | POA: Insufficient documentation

## 2017-01-11 DIAGNOSIS — I2510 Atherosclerotic heart disease of native coronary artery without angina pectoris: Secondary | ICD-10-CM | POA: Insufficient documentation

## 2017-01-11 LAB — COMPREHENSIVE METABOLIC PANEL
ALBUMIN: 3.7 g/dL (ref 3.5–5.0)
ALK PHOS: 43 U/L (ref 38–126)
ALT: 17 U/L (ref 14–54)
AST: 30 U/L (ref 15–41)
Anion gap: 8 (ref 5–15)
BILIRUBIN TOTAL: 1 mg/dL (ref 0.3–1.2)
BUN: 36 mg/dL — ABNORMAL HIGH (ref 6–20)
CALCIUM: 8.5 mg/dL — AB (ref 8.9–10.3)
CO2: 24 mmol/L (ref 22–32)
CREATININE: 1.47 mg/dL — AB (ref 0.44–1.00)
Chloride: 105 mmol/L (ref 101–111)
GFR calc Af Amer: 38 mL/min — ABNORMAL LOW (ref >60)
GFR calc non Af Amer: 33 mL/min — ABNORMAL LOW (ref >60)
GLUCOSE: 129 mg/dL — AB (ref 65–99)
Potassium: 4.7 mmol/L (ref 3.5–5.1)
SODIUM: 137 mmol/L (ref 135–145)
TOTAL PROTEIN: 6.4 g/dL — AB (ref 6.5–8.1)

## 2017-01-11 LAB — CBC WITH DIFFERENTIAL/PLATELET
BASOS PCT: 1 %
Basophils Absolute: 0.1 10*3/uL (ref 0.0–0.1)
EOS ABS: 0.4 10*3/uL (ref 0.0–0.7)
Eosinophils Relative: 4 %
HEMATOCRIT: 37.2 % (ref 36.0–46.0)
HEMOGLOBIN: 12.2 g/dL (ref 12.0–15.0)
LYMPHS ABS: 3.1 10*3/uL (ref 0.7–4.0)
Lymphocytes Relative: 27 %
MCH: 30.6 pg (ref 26.0–34.0)
MCHC: 32.8 g/dL (ref 30.0–36.0)
MCV: 93.2 fL (ref 78.0–100.0)
MONOS PCT: 10 %
Monocytes Absolute: 1.2 10*3/uL — ABNORMAL HIGH (ref 0.1–1.0)
NEUTROS ABS: 6.6 10*3/uL (ref 1.7–7.7)
NEUTROS PCT: 58 %
Platelets: 207 10*3/uL (ref 150–400)
RBC: 3.99 MIL/uL (ref 3.87–5.11)
RDW: 13.7 % (ref 11.5–15.5)
WBC: 11.4 10*3/uL — AB (ref 4.0–10.5)

## 2017-01-11 LAB — PROTIME-INR
INR: 1.2
Prothrombin Time: 15.1 seconds (ref 11.4–15.2)

## 2017-01-11 MED ORDER — ATORVASTATIN CALCIUM 20 MG PO TABS
20.0000 mg | ORAL_TABLET | Freq: Every day | ORAL | Status: DC
  Administered 2017-01-11 – 2017-01-12 (×2): 20 mg via ORAL
  Filled 2017-01-11 (×2): qty 1

## 2017-01-11 MED ORDER — LORAZEPAM 0.5 MG PO TABS: 0.5000 mg | ORAL_TABLET | Freq: Two times a day (BID) | ORAL | Status: DC | PRN

## 2017-01-11 MED ORDER — DOCUSATE SODIUM 100 MG PO CAPS
100.0000 mg | ORAL_CAPSULE | Freq: Two times a day (BID) | ORAL | Status: DC
  Administered 2017-01-11 – 2017-01-13 (×4): 100 mg via ORAL
  Filled 2017-01-11 (×3): qty 1

## 2017-01-11 MED ORDER — HYDROCHLOROTHIAZIDE 12.5 MG PO CAPS
12.5000 mg | ORAL_CAPSULE | Freq: Every day | ORAL | Status: DC
  Administered 2017-01-12 – 2017-01-13 (×2): 12.5 mg via ORAL
  Filled 2017-01-11 (×2): qty 1

## 2017-01-11 MED ORDER — MORPHINE SULFATE (PF) 2 MG/ML IV SOLN
2.0000 mg | Freq: Once | INTRAVENOUS | Status: AC
  Administered 2017-01-11: 2 mg via INTRAVENOUS
  Filled 2017-01-11: qty 1

## 2017-01-11 MED ORDER — HYDROCODONE-ACETAMINOPHEN 7.5-325 MG PO TABS: 1.0000 | ORAL_TABLET | ORAL | Status: DC | PRN

## 2017-01-11 MED ORDER — HYDROCODONE-ACETAMINOPHEN 5-325 MG PO TABS: 1.0000 | ORAL_TABLET | ORAL | Status: DC | PRN

## 2017-01-11 MED ORDER — AMLODIPINE BESYLATE 10 MG PO TABS
10.0000 mg | ORAL_TABLET | Freq: Every day | ORAL | Status: DC
  Administered 2017-01-12 – 2017-01-13 (×2): 10 mg via ORAL
  Filled 2017-01-11 (×2): qty 1

## 2017-01-11 MED ORDER — VITAMIN D 1000 UNITS PO TABS
1000.0000 [IU] | ORAL_TABLET | Freq: Every day | ORAL | Status: DC
  Administered 2017-01-13: 10:00:00 1000 [IU] via ORAL
  Filled 2017-01-11: qty 1

## 2017-01-11 MED ORDER — HYDRALAZINE HCL 10 MG PO TABS
10.0000 mg | ORAL_TABLET | Freq: Three times a day (TID) | ORAL | Status: DC
  Administered 2017-01-11 – 2017-01-13 (×5): 10 mg via ORAL
  Filled 2017-01-11 (×7): qty 1

## 2017-01-11 MED ORDER — HYDROCODONE-ACETAMINOPHEN 5-325 MG PO TABS
1.0000 | ORAL_TABLET | ORAL | Status: DC | PRN
  Administered 2017-01-11: 16:00:00 2 via ORAL
  Administered 2017-01-12 – 2017-01-13 (×5): 1 via ORAL
  Filled 2017-01-11: qty 1
  Filled 2017-01-11: qty 2
  Filled 2017-01-11: qty 1
  Filled 2017-01-11 (×2): qty 2
  Filled 2017-01-11 (×2): qty 1
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
## 2017-01-11 NOTE — Care Management Obs Status (Signed)
[facility] NOTIFICATION   Patient Details  Name: Wanda Brooks MRN: [id number] Date of Birth: Jul 02, 1935   Medicare Observation Status Notification Given:  Yes    Arcelia Pals, Benjaman Lobe, RN 01/11/2017, 3:12 PM

## 2017-01-11 NOTE — ED Triage Notes (Signed)
Patient c/o right hip pain since last night.. Patient had right hip surgery 01/08/16. Patient's daughter reported swelling to the right hip.
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
## 2017-01-11 NOTE — Care Management Note (Signed)
Case Management Note  Patient Details  Name: Wanda Brooks MRN: [id number] Date of Birth: 1935-07-09  Action/Plan: Pt is active with [facility] services but they are currently "suspended" per ortho MD until after a NM scan can be completed on pt's hip on 01/19/2017.  Expected Discharge Date:    Unknown             Expected Discharge Plan:  [place]  Choice offered to:  Patient, Adult Children  [place] Agency:  [facility]  Status of Service:  In process, will continue to follow  Rae Mar, RN 01/11/2017, 3:19 PM

## 2017-01-12 ENCOUNTER — Other Ambulatory Visit: Payer: Self-pay

## 2017-01-12 ENCOUNTER — Observation Stay (HOSPITAL_COMMUNITY): Payer: Medicare Other

## 2017-01-12 DIAGNOSIS — S7001XA Contusion of right hip, initial encounter: Secondary | ICD-10-CM | POA: Diagnosis not present

## 2017-01-12 DIAGNOSIS — M25551 Pain in right hip: Secondary | ICD-10-CM | POA: Diagnosis not present

## 2017-01-12 DIAGNOSIS — M25451 Effusion, right hip: Secondary | ICD-10-CM | POA: Diagnosis not present

## 2017-01-12 MED ORDER — LIDOCAINE-EPINEPHRINE (PF) 2 %-1:200000 IJ SOLN
INTRAMUSCULAR | Status: AC
  Filled 2017-01-12: qty 20

## 2017-01-12 NOTE — Procedures (Signed)
Pre procedural Dx: Right lateral thigh/hip hematoma. Post procedural Dx: Same  Successful US guided aspiration of approximately 25 mL of maroon colored, non foul smelling from the residual/recurrent presumed evolving complex hematoma about the right lateral thigh/hip.   Samples were sent to the laboratory as requested by the ordering clinical team.  EBL: None  Complications: None immediate  Ronny Bacon, MD Pager #: [phone]

## 2017-01-12 NOTE — Plan of Care (Signed)
Plan of care discussed with patient and daughter. Deny any questions at this time.

## 2017-01-12 NOTE — Plan of Care (Signed)
  Clinical Measurements: Ability to maintain clinical measurements within normal limits will improve 01/12/2017 0858 - Progressing by Oleta Mouse, RN   Coping: Level of anxiety will decrease 01/12/2017 0858 - Progressing by Oleta Mouse, RN   Pain Managment: General experience of comfort will improve 01/12/2017 0858 - Progressing by Oleta Mouse, RN   Safety: Ability to remain free from injury will improve 01/12/2017 0858 - Progressing by Oleta Mouse, RN
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
## 2017-01-13 DIAGNOSIS — S7001XA Contusion of right hip, initial encounter: Secondary | ICD-10-CM | POA: Diagnosis not present

## 2017-01-13 MED ORDER — HYDROCODONE-ACETAMINOPHEN 5-325 MG PO TABS: 1.0000 | ORAL_TABLET | ORAL | 0 refills | Status: DC | PRN

## 2017-01-13 NOTE — Progress Notes (Signed)
Patient discharged to home with family. Given all belongings and instructions. Informed patient and family that new prescription needs to be signed and PA would be available at noon to sign it. Both requested to leave for home without the prescription. Reports she has hydrocodone at home and can use that if needed. Educated dtr and patient that the doctor ordered the dosage changed from 7.5 to 5mg . Patient still requested to d/c without Rx.  Encouraged patient and dtr to call primary MD or cardiologist about eliquis use, when to restart.  Patient and DTR verbalized understanding of all instructions.

## 2017-01-13 NOTE — Progress Notes (Signed)
     Subjective: S/P aspiration of the right hip joint   Patient reports pain as mild, significant relief of pain after the aspirations.  Patient feels that some of the swelling has resolved, but the pain has significantly resolved.   Objective:   VITALS:   Vitals:   01/12/17 2132 01/13/17 0608  BP: (!) 159/36 (!) 185/43  Pulse: 62 63  Resp: 16 16  Temp: 98.8 F (37.1 C) 98.5 F (36.9 C)  SpO2: 95% 97%    Dorsiflexion/Plantar flexion intact Incision: dressing C/D/I No cellulitis present Compartment soft  LABS Recent Labs    01/11/17 1215  HGB 12.2  HCT 37.2  WBC 11.4*  PLT 207    Recent Labs    01/11/17 1215  NA 137  K 4.7  BUN 36*  CREATININE 1.47*  GLUCOSE 129*     Assessment/Plan:      Up with therapy. Discharge home. Patient already scheduled for a bone scan to evaluate the implants. Patient already has a follow up appointment at the office and will keep this appointment. Will further review results with patient at that time of all test.     West Pugh. Sebastin Perlmutter   PAC  01/13/2017, 10:04 AM

## 2017-01-13 NOTE — Plan of Care (Signed)
Plan of care reviewed with patient and daughter.

## 2017-01-14 ENCOUNTER — Telehealth: Payer: Self-pay | Admitting: *Deleted

## 2017-01-14 NOTE — Telephone Encounter (Signed)
Transition Care Management Follow-up Telephone Call   TCM call completed by pts daughter [name]   Date discharged? 01/13/2017   How have you been since you were released from the hospital? "weak"   Do you understand why you were in the hospital? yes   Do you understand the discharge instructions? yes   Where were you discharged to? home    Functional Questionnaire:   Activities of Daily Living (ADLs):    States they require assistance with the following: all areas; daughter is there to assist   Any transportation issues/concerns?: no   Any patient concerns? no   Confirmed importance and date/time of follow-up visits scheduled yes  Provider Appointment booked with [facility] 11/27  Confirmed with patient if condition begins to worsen call PCP or go to the ER.  Patient was given the office number and encouraged to call back with question or concerns.  : yes

## 2017-01-17 LAB — AEROBIC/ANAEROBIC CULTURE (SURGICAL/DEEP WOUND): CULTURE: NO GROWTH

## 2017-01-17 LAB — AEROBIC/ANAEROBIC CULTURE W GRAM STAIN (SURGICAL/DEEP WOUND)
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
## 2017-01-19 ENCOUNTER — Encounter
Admission: RE | Admit: 2017-01-19 | Discharge: 2017-01-19 | Disposition: A | Discharge status: Home / Self Care | Payer: Medicare Other | Source: Ambulatory Visit | Attending: Orthopedic Surgery | Admitting: Orthopedic Surgery

## 2017-01-19 DIAGNOSIS — Z96641 Presence of right artificial hip joint: Secondary | ICD-10-CM | POA: Diagnosis not present

## 2017-01-19 DIAGNOSIS — M25551 Pain in right hip: Secondary | ICD-10-CM | POA: Diagnosis not present

## 2017-01-19 DIAGNOSIS — M7989 Other specified soft tissue disorders: Secondary | ICD-10-CM | POA: Diagnosis not present

## 2017-01-19 MED ORDER — TECHNETIUM TC 99M MEDRONATE IV KIT
25.0000 | PACK | Freq: Once | INTRAVENOUS | Status: AC | PRN
  Administered 2017-01-19: 24.61 via INTRAVENOUS

## 2017-01-25 DIAGNOSIS — Z471 Aftercare following joint replacement surgery: Secondary | ICD-10-CM | POA: Diagnosis not present

## 2017-01-25 DIAGNOSIS — Z96641 Presence of right artificial hip joint: Secondary | ICD-10-CM | POA: Diagnosis not present

## 2017-01-26 ENCOUNTER — Inpatient Hospital Stay: Payer: Medicare Other | Admitting: Internal Medicine

## 2017-01-27 ENCOUNTER — Ambulatory Visit (INDEPENDENT_AMBULATORY_CARE_PROVIDER_SITE_OTHER): Payer: Medicare Other | Admitting: Internal Medicine

## 2017-01-27 ENCOUNTER — Encounter: Payer: Self-pay | Admitting: Internal Medicine

## 2017-01-27 VITALS — BP 146/88 | HR 82 | Temp 98.2°F | Wt 158.0 lb

## 2017-01-27 DIAGNOSIS — F419 Anxiety disorder, unspecified: Secondary | ICD-10-CM | POA: Diagnosis not present

## 2017-01-27 DIAGNOSIS — S7001XA Contusion of right hip, initial encounter: Secondary | ICD-10-CM

## 2017-01-27 NOTE — Patient Instructions (Signed)
Hematoma A hematoma is a collection of blood. The collection of blood can turn into a hard, painful lump under the skin. Your skin may turn blue or yellow if the hematoma is close to the surface of the skin. Most hematomas get better in a few days to weeks. Some hematomas are serious and need medical care. Hematomas can be very small or very big. Follow these instructions at home:  Apply ice to the injured area: ? Put ice in a plastic bag. ? Place a towel between your skin and the bag. ? Leave the ice on for 20 minutes, 2-3 times a day for the first 1 to 2 days.  After the first 2 days, switch to using warm packs on the injured area.  Raise (elevate) the injured area to lessen pain and puffiness (swelling). You may also wrap the area with an elastic bandage. Make sure the bandage is not wrapped too tight.  If you have a painful hematoma on your leg or foot, you may use crutches for a couple days.  Only take medicines as told by your doctor. Get help right away if:  Your pain gets worse.  Your pain is not controlled with medicine.  You have a fever.  Your puffiness gets worse.  Your skin turns more blue or yellow.  Your skin over the hematoma breaks or starts bleeding.  Your hematoma is in your chest or belly (abdomen) and you are short of breath, feel weak, or have a change in consciousness.  Your hematoma is on your scalp and you have a headache that gets worse or a change in alertness or consciousness. This information is not intended to replace advice given to you by your health care provider. Make sure you discuss any questions you have with your health care provider. Document Released: 03/26/2004 Document Revised: 07/25/2015 Document Reviewed: 07/27/2012 Elsevier Interactive Patient Education  2017 [employer]  
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
## 2017-01-28 ENCOUNTER — Other Ambulatory Visit: Payer: Self-pay | Admitting: Internal Medicine

## 2017-01-29 NOTE — Telephone Encounter (Signed)
Please advise if okay to refill. 

## 2017-02-01 ENCOUNTER — Telehealth: Payer: Self-pay | Admitting: Internal Medicine

## 2017-02-01 NOTE — Telephone Encounter (Signed)
I have been looking for this paperwork and have not seen it.

## 2017-02-01 NOTE — Telephone Encounter (Signed)
Copied from [place] [id number]. Topic: Quick Communication - See Telephone Encounter >> Feb 01, 2017  9:27 AM Cleaster Corin, NT wrote: CRM for notification. See Telephone encounter for:   02/01/17. Pt. Daughter [name]) called about mothers fmla paperwork that hasn't been faxed as of yet. Asked daughter for fax number she didn't have it at the moment (driving) she stated that it should be on paper work. Please give pt. Daughter a call at [phone]. (paper work for daughter to be off work to be with pt.)

## 2017-02-01 NOTE — Telephone Encounter (Signed)
Spoke to [place] and advised we have not received paperwork. Provided her with direct fax [phone]. States she will have them resend

## 2017-02-03 ENCOUNTER — Other Ambulatory Visit: Payer: Self-pay | Admitting: Internal Medicine

## 2017-02-09 ENCOUNTER — Ambulatory Visit: Payer: Self-pay

## 2017-02-09 NOTE — Telephone Encounter (Signed)
   Reason for Disposition . [1] Intermittent  chest pain or "angina" AND [2] increasing in severity or frequency  (Exception: pains lasting a few seconds)  Answer Assessment - Initial Assessment Questions 1. LOCATION: "Where does it hurt?"       Center of chest 2. RADIATION: "Does the pain go anywhere else?" (e.g., into neck, jaw, arms, back)     No 3. ONSET: "When did the chest pain begin?" (Minutes, hours or days)      Started last week. 4. PATTERN "Does the pain come and go, or has it been constant since it started?"  "Does it get worse with exertion?"      Comes and goes 5. DURATION: "How long does it last" (e.g., seconds, minutes, hours)     Lasts 30 minutes 6. SEVERITY: "How bad is the pain?"  (e.g., Scale 1-10; mild, moderate, or severe)    - MILD (1-3): doesn't interfere with normal activities     - MODERATE (4-7): interferes with normal activities or awakens from sleep    - SEVERE (8-10): excruciating pain, unable to do any normal activities       Moderate 7. CARDIAC RISK FACTORS: "Do you have any history of heart problems or risk factors for heart disease?" (e.g., prior heart attack, angina; high blood pressure, diabetes, being overweight, high cholesterol, smoking, or strong family history of heart disease)     Pacemaker 8. PULMONARY RISK FACTORS: "Do you have any history of lung disease?"  (e.g., blood clots in lung, asthma, emphysema, birth control pills)     COPD, On Eliquis 9. CAUSE: "What do you think is causing the chest pain?"     Has heart failure per daughter 67. OTHER SYMPTOMS: "Do you have any other symptoms?" (e.g., dizziness, nausea, vomiting, sweating, fever, difficulty breathing, cough)       Fatigue and a cough x 3 weeks 11. PREGNANCY: "Is there any chance you are pregnant?" "When was your last menstrual period?"       No  Protocols used: CHEST PAIN-A-AH Daughter reports pt. Has felt bad and had "chest pressure for about a week." Instructed daughter to take  pt. To ED now. Verbalizes understanding.

## 2017-02-10 ENCOUNTER — Ambulatory Visit (INDEPENDENT_AMBULATORY_CARE_PROVIDER_SITE_OTHER): Payer: Medicare Other | Admitting: Family Medicine

## 2017-02-10 ENCOUNTER — Ambulatory Visit (INDEPENDENT_AMBULATORY_CARE_PROVIDER_SITE_OTHER)
Admission: RE | Admit: 2017-02-10 | Discharge: 2017-02-10 | Disposition: A | Discharge status: Home / Self Care | Payer: Medicare Other | Source: Ambulatory Visit | Attending: Family Medicine | Admitting: Family Medicine

## 2017-02-10 ENCOUNTER — Encounter: Payer: Self-pay | Admitting: Family Medicine

## 2017-02-10 VITALS — BP 138/80 | HR 85 | Temp 98.2°F | Wt 156.0 lb

## 2017-02-10 DIAGNOSIS — R05 Cough: Secondary | ICD-10-CM

## 2017-02-10 DIAGNOSIS — I25810 Atherosclerosis of coronary artery bypass graft(s) without angina pectoris: Secondary | ICD-10-CM

## 2017-02-10 DIAGNOSIS — R059 Cough, unspecified: Secondary | ICD-10-CM

## 2017-02-10 DIAGNOSIS — E119 Type 2 diabetes mellitus without complications: Secondary | ICD-10-CM

## 2017-02-10 DIAGNOSIS — J449 Chronic obstructive pulmonary disease, unspecified: Secondary | ICD-10-CM

## 2017-02-10 DIAGNOSIS — J441 Chronic obstructive pulmonary disease with (acute) exacerbation: Secondary | ICD-10-CM | POA: Insufficient documentation

## 2017-02-10 MED ORDER — PREDNISONE 20 MG PO TABS: 40.0000 mg | ORAL_TABLET | Freq: Every day | ORAL | 0 refills | Status: DC

## 2017-02-10 MED ORDER — DOXYCYCLINE HYCLATE 100 MG PO TABS: 100.0000 mg | ORAL_TABLET | Freq: Two times a day (BID) | ORAL | 0 refills | Status: DC

## 2017-02-10 NOTE — Telephone Encounter (Signed)
Paperwork faxed 12/7  [name] aware Copy for file Copy for pt Copy for scan

## 2017-02-10 NOTE — Progress Notes (Signed)
BP 138/80 (BP Location: Left Arm, Patient Position: Sitting, Cuff Size: Normal)   Pulse 85   Temp 98.2 F (36.8 C) (Oral)   Wt 156 lb (70.8 kg)   SpO2 94%   BMI 25.96 kg/m    CC: cough, fatigue Subjective:    Patient ID: Wanda Brooks, female    DOB: March 03, 1935, 81 y.o.   MRN: [id number]  HPI: Wanda Brooks is a 81 y.o. female presenting on 02/10/2017 for Cough (for about 3 wks, not improving. Has tried OTC cough meds, no help. Has had pressure in upper mid chest at bottom of throat off and on. H/o COPD); Wheezing; and Fatigue   Here with caregiver.  See recent phone note - called yesterday advised to go to ER, did not go.   Wanda Brooks patient new to me with known afib, CAD, CKD, COPD, DM and pacemaker presents with 3 wk h/o productive cough of green sputum day and night, wheezing. Also has noticed increasing heartburn worse at night time. Over last 10 days also endorsing squeezing chest sensation near pacemaker but painful. More fatigued since this - napping during the day (this is new). Increased dyspnea, increased cough and sputum production. Some L lower back pain.   Known COPD current 1/4 ppd smoker.   Treating with guaifenesin and corcedin HBP with temporary improvement.  Zantac has helped heartburn.   Relevant past medical, surgical, family and social history reviewed and updated as indicated. Interim medical history since our last visit reviewed. Allergies and medications reviewed and updated. Outpatient Medications Prior to Visit  Medication Sig Dispense Refill  . amLODipine (NORVASC) 10 MG tablet Take 1 tablet (10 mg total) by mouth daily with breakfast. 90 tablet 1  . atorvastatin (LIPITOR) 20 MG tablet TAKE 1 TABLET(20 MG) BY MOUTH EVERY EVENING 30 tablet 4  . cholecalciferol (VITAMIN D) 1000 units tablet Take 1,000 Units by mouth daily.    Marland Kitchen docusate sodium (COLACE) 100 MG capsule Take 100 mg by mouth 2 (two) times daily.    . hydrALAZINE (APRESOLINE) 10 MG tablet  TAKE 1 TABLET(10 MG) BY MOUTH EVERY 8 HOURS 270 tablet 1  . HYDROcodone-acetaminophen (NORCO/VICODIN) 5-325 MG tablet Take 1-2 tablets every 4 (four) hours as needed by mouth for moderate pain. 40 tablet 0  . isosorbide mononitrate (IMDUR) 120 MG 24 hr tablet TAKE 1 TABLET(120 MG) BY MOUTH DAILY WITH BREAKFAST 30 tablet 4  . LORazepam (ATIVAN) 0.5 MG tablet Take 1 tablet (0.5 mg total) by mouth every 12 (twelve) hours as needed for anxiety. 60 tablet 0  . metoprolol succinate (TOPROL-XL) 100 MG 24 hr tablet TAKE 1 TABLET(100 MG) BY MOUTH TWICE DAILY 60 tablet 4  . oxybutynin (DITROPAN-XL) 5 MG 24 hr tablet TAKE 1 TABLET BY MOUTH EVERY NIGHT AT BEDTIME 90 tablet 1  . hydrochlorothiazide (MICROZIDE) 12.5 MG capsule Take 1 capsule (12.5 mg total) by mouth daily. 90 capsule 0   No facility-administered medications prior to visit.      Per HPI unless specifically indicated in ROS section below Review of Systems     Objective:    BP 138/80 (BP Location: Left Arm, Patient Position: Sitting, Cuff Size: Normal)   Pulse 85   Temp 98.2 F (36.8 C) (Oral)   Wt 156 lb (70.8 kg)   SpO2 94%   BMI 25.96 kg/m   Wt Readings from Last 3 Encounters:  02/10/17 156 lb (70.8 kg)  01/27/17 158 lb (71.7 kg)  01/11/17 146 lb (66.2 kg)  Physical Exam  Constitutional: She appears well-developed and well-nourished. No distress.  HENT:  Head: Normocephalic and atraumatic.  Right Ear: Hearing, tympanic membrane, external ear and ear canal normal.  Left Ear: Hearing, tympanic membrane, external ear and ear canal normal.  Nose: No mucosal edema or rhinorrhea. Right sinus exhibits no maxillary sinus tenderness and no frontal sinus tenderness. Left sinus exhibits no maxillary sinus tenderness and no frontal sinus tenderness.  Mouth/Throat: Uvula is midline, oropharynx is clear and moist and mucous membranes are normal. No oropharyngeal exudate, posterior oropharyngeal edema, posterior oropharyngeal erythema or  tonsillar abscesses.  Eyes: Conjunctivae and EOM are normal. Pupils are equal, round, and reactive to light. No scleral icterus.  Neck: Normal range of motion. Neck supple.  Cardiovascular: Normal rate, regular rhythm, normal heart sounds and intact distal pulses.  No murmur heard. Pulmonary/Chest: Effort normal. No respiratory distress. She has no decreased breath sounds. She has no wheezes. She has no rhonchi. She has rales (LLL).  Lymphadenopathy:    She has no cervical adenopathy.  Skin: Skin is warm and dry. No rash noted.  Nursing note and vitals reviewed.  Lab Results  Component Value Date   HGBA1C 6.1 06/15/2016       Assessment & Plan:   Problem List Items Addressed This Visit    Chronic obstructive pulmonary disease (HCC)   Relevant Medications   predniSONE (DELTASONE) 20 MG tablet   COPD exacerbation (HCC) - Primary    Productive cough x 3 wks in COPD current smoker. No wheezing, good air movement in lungs however she does have some LLL rales - check CXR today - overall clear on my read.  Treat as COPD exacerbation with doxy/prednisone burst. Update if not improving with treatment. Pt agrees with plan.  Chest sensation does not sound anginal.       Relevant Medications   predniSONE (DELTASONE) 20 MG tablet   Type 2 diabetes mellitus without complication, without long-term current use of insulin ([place])       Follow up plan: Return if symptoms worsen or fail to improve.  Ria Bush, MD

## 2017-02-10 NOTE — Patient Instructions (Addendum)
I think you have COPD exacerbation - treat with prednisone burst and doxycycline antibiotic. Make sure to stay well hydrated, get plenty of rest. Let us know if fever >101 or worsening productive cough or shortness of breath despite treatment.

## 2017-02-10 NOTE — Assessment & Plan Note (Signed)
Productive cough x 3 wks in COPD current smoker. No wheezing, good air movement in lungs however she does have some LLL rales - check CXR today - overall clear on my read.  Treat as COPD exacerbation with doxy/prednisone burst. Update if not improving with treatment. Pt agrees with plan.  Chest sensation does not sound anginal.

## 2017-02-10 NOTE — Telephone Encounter (Signed)
I called [facility] DPR signed and pt did not go anywhere on 02/09/17. Today pt is coughing but does not have chest pressure; pt has CHF, ankles are not swollen; abd is more swollen but daughter thought was due to gaining wt. Wheezing more and daughter thinks ? Harder to breathe; pt refuses to go to ED. 30' appt scheduled for 02/10/17 at 3:15 with Dr Darnell Level. If pt condition worsens prior to appt [name] said she would make her mom go to ED.

## 2017-02-13 ENCOUNTER — Encounter: Payer: Self-pay | Admitting: Family Medicine

## 2017-02-13 DIAGNOSIS — I700 Atherosclerosis of aorta: Secondary | ICD-10-CM | POA: Insufficient documentation

## 2017-02-17 DIAGNOSIS — M25551 Pain in right hip: Secondary | ICD-10-CM | POA: Diagnosis not present

## 2017-02-18 ENCOUNTER — Other Ambulatory Visit: Payer: Self-pay | Admitting: Internal Medicine

## 2017-02-18 MED ORDER — HYDROCODONE-ACETAMINOPHEN 5-325 MG PO TABS: 1.0000 | ORAL_TABLET | ORAL | 0 refills | Status: DC | PRN

## 2017-02-18 NOTE — Telephone Encounter (Signed)
RX printed and signed. Have her make a pain management appointment before her next refill.

## 2017-02-18 NOTE — Telephone Encounter (Signed)
Rx left in front office for pick up and pt is aware  

## 2017-02-18 NOTE — Telephone Encounter (Signed)
Last Rx 01/13/2017. Last OV12/01/2017

## 2017-02-18 NOTE — Telephone Encounter (Signed)
Copied from [place] [id number]. Topic: Quick Communication - See Telephone Encounter >> Feb 18, 2017  9:04 AM Ether Griffins B wrote: CRM for notification. See Telephone encounter for:  Pt needing refill on hydrocodone  02/18/17.

## 2017-02-19 DIAGNOSIS — M25551 Pain in right hip: Secondary | ICD-10-CM | POA: Diagnosis not present

## 2017-02-19 DIAGNOSIS — Z96641 Presence of right artificial hip joint: Secondary | ICD-10-CM | POA: Diagnosis not present

## 2017-02-19 DIAGNOSIS — Z471 Aftercare following joint replacement surgery: Secondary | ICD-10-CM | POA: Diagnosis not present

## 2017-02-26 DIAGNOSIS — M25551 Pain in right hip: Secondary | ICD-10-CM | POA: Diagnosis not present

## 2017-03-01 DIAGNOSIS — M25551 Pain in right hip: Secondary | ICD-10-CM | POA: Diagnosis not present

## 2017-03-05 DIAGNOSIS — M25551 Pain in right hip: Secondary | ICD-10-CM | POA: Diagnosis not present

## 2017-03-09 DIAGNOSIS — M25551 Pain in right hip: Secondary | ICD-10-CM | POA: Diagnosis not present

## 2017-03-12 DIAGNOSIS — M25551 Pain in right hip: Secondary | ICD-10-CM | POA: Diagnosis not present

## 2017-03-16 DIAGNOSIS — M25551 Pain in right hip: Secondary | ICD-10-CM | POA: Diagnosis not present

## 2017-03-18 DIAGNOSIS — M25551 Pain in right hip: Secondary | ICD-10-CM | POA: Diagnosis not present

## 2017-03-24 DIAGNOSIS — M25551 Pain in right hip: Secondary | ICD-10-CM | POA: Diagnosis not present

## 2017-03-25 ENCOUNTER — Encounter: Payer: Self-pay | Admitting: Internal Medicine

## 2017-03-25 ENCOUNTER — Ambulatory Visit (INDEPENDENT_AMBULATORY_CARE_PROVIDER_SITE_OTHER): Payer: Medicare Other | Admitting: Internal Medicine

## 2017-03-25 VITALS — BP 138/78 | HR 78 | Temp 98.1°F | Wt 143.0 lb

## 2017-03-25 DIAGNOSIS — M545 Low back pain, unspecified: Secondary | ICD-10-CM

## 2017-03-25 DIAGNOSIS — Z79899 Other long term (current) drug therapy: Secondary | ICD-10-CM | POA: Diagnosis not present

## 2017-03-25 DIAGNOSIS — G8929 Other chronic pain: Secondary | ICD-10-CM | POA: Diagnosis not present

## 2017-03-25 DIAGNOSIS — M25551 Pain in right hip: Secondary | ICD-10-CM | POA: Diagnosis not present

## 2017-03-25 MED ORDER — HYDROCODONE-ACETAMINOPHEN 7.5-325 MG PO TABS: 1.0000 | ORAL_TABLET | Freq: Two times a day (BID) | ORAL | 0 refills | Status: DC

## 2017-03-25 NOTE — Progress Notes (Signed)
   Subjective:    Patient ID: Wanda Brooks, female    DOB: 23-Aug-1935, 82 y.o.   MRN: [id number]  HPI Reviewed note and agree with current treatment plan to include hydrocodone   Review of Systems     Objective:   Physical Exam        Assessment & Plan:
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
## 2017-03-25 NOTE — Patient Instructions (Signed)

## 2017-03-26 ENCOUNTER — Ambulatory Visit: Payer: Medicare Other | Admitting: Internal Medicine

## 2017-03-29 LAB — PAIN MGMT, PROFILE 8 W/CONF, U
6 ACETYLMORPHINE: NEGATIVE ng/mL (ref <10)
ALPHAHYDROXYMIDAZOLAM: NEGATIVE ng/mL (ref <50)
AMPHETAMINES: NEGATIVE ng/mL (ref <500)
Alcohol Metabolites: NEGATIVE ng/mL (ref <500)
Alphahydroxyalprazolam: NEGATIVE ng/mL (ref <25)
Alphahydroxytriazolam: NEGATIVE ng/mL (ref <50)
Aminoclonazepam: NEGATIVE ng/mL (ref <25)
BENZODIAZEPINES: POSITIVE ng/mL — AB (ref <100)
Buprenorphine, Urine: NEGATIVE ng/mL (ref <5)
COCAINE METABOLITE: NEGATIVE ng/mL (ref <150)
CREATININE: 112 mg/dL
Codeine: NEGATIVE ng/mL (ref <50)
HYDROCODONE: 303 ng/mL — AB (ref <50)
HYDROXYETHYLFLURAZEPAM: NEGATIVE ng/mL (ref <50)
Hydromorphone: 133 ng/mL — ABNORMAL HIGH (ref <50)
LORAZEPAM: 833 ng/mL — AB (ref <50)
MARIJUANA METABOLITE: NEGATIVE ng/mL (ref <20)
MDMA: NEGATIVE ng/mL (ref <500)
MORPHINE: NEGATIVE ng/mL (ref <50)
NORDIAZEPAM: NEGATIVE ng/mL (ref <50)
NORHYDROCODONE: 897 ng/mL — AB (ref <50)
OPIATES: POSITIVE ng/mL — AB (ref <100)
OXIDANT: NEGATIVE ug/mL (ref <200)
Oxazepam: NEGATIVE ng/mL (ref <50)
Oxycodone: NEGATIVE ng/mL (ref <100)
PH: 6.91 (ref 4.5–9.0)
Temazepam: NEGATIVE ng/mL (ref <50)

## 2017-05-25 ENCOUNTER — Other Ambulatory Visit: Payer: Self-pay | Admitting: Internal Medicine

## 2017-06-06 ENCOUNTER — Other Ambulatory Visit: Payer: Self-pay | Admitting: Internal Medicine

## 2017-06-07 MED ORDER — ELIQUIS 5 MG PO TABS: 5.0000 mg | ORAL_TABLET | Freq: Two times a day (BID) | ORAL | 1 refills | Status: DC

## 2017-06-07 NOTE — Telephone Encounter (Signed)
Pt last saw Dr Caryl Comes 06/23/16, next scheduled appt 06/15/17. Last weight 64.9Kg on 03/25/17. SCr 1.47 on 01/11/17. Age 82 yrs old. Will refill Eliquis 5mg  BID.

## 2017-06-13 ENCOUNTER — Other Ambulatory Visit: Payer: Self-pay | Admitting: Internal Medicine

## 2017-06-14 NOTE — Telephone Encounter (Signed)
Last filled 08/27/2016... Please advise

## 2017-06-15 ENCOUNTER — Other Ambulatory Visit: Payer: Self-pay | Admitting: Internal Medicine

## 2017-06-15 ENCOUNTER — Ambulatory Visit (INDEPENDENT_AMBULATORY_CARE_PROVIDER_SITE_OTHER): Payer: Medicare Other | Admitting: Internal Medicine

## 2017-06-15 ENCOUNTER — Encounter: Payer: Self-pay | Admitting: Internal Medicine

## 2017-06-15 VITALS — BP 142/84 | HR 82 | Ht 65.0 in | Wt 156.5 lb

## 2017-06-15 DIAGNOSIS — Z950 Presence of cardiac pacemaker: Secondary | ICD-10-CM

## 2017-06-15 DIAGNOSIS — I4819 Other persistent atrial fibrillation: Secondary | ICD-10-CM

## 2017-06-15 DIAGNOSIS — I10 Essential (primary) hypertension: Secondary | ICD-10-CM

## 2017-06-15 DIAGNOSIS — I481 Persistent atrial fibrillation: Secondary | ICD-10-CM

## 2017-06-15 DIAGNOSIS — R001 Bradycardia, unspecified: Secondary | ICD-10-CM

## 2017-06-15 MED ORDER — AMIODARONE HCL 200 MG PO TABS: ORAL_TABLET | ORAL | 0 refills | Status: DC
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
## 2017-06-15 NOTE — Patient Instructions (Signed)
Medication Instructions: - Your physician has recommended you make the following change in your medication:   1) START amiodarone 200 mg:  - take 2 tablets (400 mg) by mouth TWICE daily x 2 weeks, then  - take 2 tablets (400 mg) by mouth ONCE daily x 2 weeks, then  - take 1 tablet (200 mg) by mouth ONCE daily going forward   Labwork: - Your physician recommends that you have lab work today: CMET/ CBC/ TSH   Procedures/Testing: - none ordered  Follow-Up: - Your physician recommends that you schedule a follow-up appointment in: 3 months with Dr. Caryl Comes.   Any Additional Special Instructions Will Be Listed Below (If Applicable).     If you need a refill on your cardiac medications before your next appointment, please call your pharmacy.

## 2017-06-16 ENCOUNTER — Encounter: Payer: Medicare Other | Admitting: Internal Medicine

## 2017-06-16 LAB — COMPREHENSIVE METABOLIC PANEL
ALBUMIN: 4.3 g/dL (ref 3.5–4.8)
ALT: 17 IU/L (ref 0–32)
AST: 21 IU/L (ref 0–40)
Albumin/Globulin Ratio: 1.7 (ref 1.2–2.2)
Alkaline Phosphatase: 49 IU/L (ref 39–117)
BUN / CREAT RATIO: 21 (ref 12–28)
BUN: 34 mg/dL — ABNORMAL HIGH (ref 8–27)
Bilirubin Total: 0.3 mg/dL (ref 0.0–1.2)
CALCIUM: 9.8 mg/dL (ref 8.7–10.3)
CO2: 24 mmol/L (ref 20–29)
CREATININE: 1.61 mg/dL — AB (ref 0.57–1.00)
Chloride: 106 mmol/L (ref 96–106)
GFR calc Af Amer: 35 mL/min/{1.73_m2} — ABNORMAL LOW (ref >59)
GFR, EST NON AFRICAN AMERICAN: 30 mL/min/{1.73_m2} — AB (ref >59)
GLOBULIN, TOTAL: 2.5 g/dL (ref 1.5–4.5)
Glucose: 129 mg/dL — ABNORMAL HIGH (ref 65–99)
Potassium: 4.6 mmol/L (ref 3.5–5.2)
SODIUM: 145 mmol/L — AB (ref 134–144)
Total Protein: 6.8 g/dL (ref 6.0–8.5)

## 2017-06-16 LAB — TSH: TSH: 2.21 u[IU]/mL (ref 0.450–4.500)

## 2017-06-16 LAB — CBC WITH DIFFERENTIAL/PLATELET
BASOS: 1 %
Basophils Absolute: 0.1 10*3/uL (ref 0.0–0.2)
EOS (ABSOLUTE): 0.5 10*3/uL — ABNORMAL HIGH (ref 0.0–0.4)
EOS: 5 %
HEMATOCRIT: 38.7 % (ref 34.0–46.6)
HEMOGLOBIN: 12.9 g/dL (ref 11.1–15.9)
IMMATURE GRANULOCYTES: 0 %
Immature Grans (Abs): 0 10*3/uL (ref 0.0–0.1)
LYMPHS ABS: 2.8 10*3/uL (ref 0.7–3.1)
Lymphs: 24 %
MCH: 30.6 pg (ref 26.6–33.0)
MCHC: 33.3 g/dL (ref 31.5–35.7)
MCV: 92 fL (ref 79–97)
MONOS ABS: 0.8 10*3/uL (ref 0.1–0.9)
Monocytes: 7 %
NEUTROS PCT: 63 %
Neutrophils Absolute: 7.1 10*3/uL — ABNORMAL HIGH (ref 1.4–7.0)
Platelets: 259 10*3/uL (ref 150–379)
RBC: 4.21 x10E6/uL (ref 3.77–5.28)
RDW: 13.8 % (ref 12.3–15.4)
WBC: 11.3 10*3/uL — ABNORMAL HIGH (ref 3.4–10.8)

## 2017-06-17 ENCOUNTER — Other Ambulatory Visit: Payer: Self-pay | Admitting: *Deleted

## 2017-06-17 ENCOUNTER — Encounter: Payer: Medicare Other | Admitting: Internal Medicine

## 2017-06-17 MED ORDER — ELIQUIS 5 MG PO TABS: 5.0000 mg | ORAL_TABLET | Freq: Two times a day (BID) | ORAL | 3 refills | Status: DC

## 2017-06-17 NOTE — Telephone Encounter (Signed)
Eliquis 5mg  [employer] paper refill request received; pt is 82 yrs old, Wt-71kg, Crea-1.61 on 06/15/17, last seen by Dr. Caryl Comes on 06/15/17; will send in refill to requested pharmacy.

## 2017-06-17 NOTE — Telephone Encounter (Signed)
Noted  

## 2017-06-17 NOTE — Telephone Encounter (Signed)
Please see note below pt requesting 90 day supply for amiodarone . Pt had recent change at last office visit.

## 2017-06-17 NOTE — Telephone Encounter (Signed)
This RX was just for her loading dose, so the directions will change after the 1 st month. In a couple of weeks, I will send in her maintenance dose and will do it for a 90-day supply.  The current RX will need to stay as it is.

## 2017-06-21 ENCOUNTER — Encounter: Payer: Self-pay | Admitting: Emergency Medicine

## 2017-06-21 ENCOUNTER — Telehealth: Payer: Self-pay | Admitting: Internal Medicine

## 2017-06-21 ENCOUNTER — Other Ambulatory Visit: Payer: Self-pay

## 2017-06-21 DIAGNOSIS — Z79899 Other long term (current) drug therapy: Secondary | ICD-10-CM | POA: Insufficient documentation

## 2017-06-21 DIAGNOSIS — I5032 Chronic diastolic (congestive) heart failure: Secondary | ICD-10-CM | POA: Diagnosis not present

## 2017-06-21 DIAGNOSIS — F17210 Nicotine dependence, cigarettes, uncomplicated: Secondary | ICD-10-CM | POA: Diagnosis not present

## 2017-06-21 DIAGNOSIS — Z7901 Long term (current) use of anticoagulants: Secondary | ICD-10-CM | POA: Diagnosis not present

## 2017-06-21 DIAGNOSIS — M25051 Hemarthrosis, right hip: Secondary | ICD-10-CM | POA: Diagnosis not present

## 2017-06-21 DIAGNOSIS — I130 Hypertensive heart and chronic kidney disease with heart failure and stage 1 through stage 4 chronic kidney disease, or unspecified chronic kidney disease: Secondary | ICD-10-CM | POA: Insufficient documentation

## 2017-06-21 DIAGNOSIS — J449 Chronic obstructive pulmonary disease, unspecified: Secondary | ICD-10-CM | POA: Insufficient documentation

## 2017-06-21 DIAGNOSIS — Z951 Presence of aortocoronary bypass graft: Secondary | ICD-10-CM | POA: Diagnosis not present

## 2017-06-21 DIAGNOSIS — N183 Chronic kidney disease, stage 3 (moderate): Secondary | ICD-10-CM | POA: Insufficient documentation

## 2017-06-21 DIAGNOSIS — Z96641 Presence of right artificial hip joint: Secondary | ICD-10-CM | POA: Diagnosis not present

## 2017-06-21 DIAGNOSIS — E1122 Type 2 diabetes mellitus with diabetic chronic kidney disease: Secondary | ICD-10-CM | POA: Insufficient documentation

## 2017-06-21 DIAGNOSIS — M25551 Pain in right hip: Secondary | ICD-10-CM | POA: Diagnosis not present

## 2017-06-21 DIAGNOSIS — I2510 Atherosclerotic heart disease of native coronary artery without angina pectoris: Secondary | ICD-10-CM | POA: Diagnosis not present

## 2017-06-21 LAB — CBC WITH DIFFERENTIAL/PLATELET
BASOS ABS: 0.1 10*3/uL (ref 0–0.1)
BASOS PCT: 1 %
EOS ABS: 0.4 10*3/uL (ref 0–0.7)
Eosinophils Relative: 4 %
HEMATOCRIT: 39.3 % (ref 35.0–47.0)
HEMOGLOBIN: 13.4 g/dL (ref 12.0–16.0)
Lymphocytes Relative: 23 %
Lymphs Abs: 2.6 10*3/uL (ref 1.0–3.6)
MCH: 31.6 pg (ref 26.0–34.0)
MCHC: 34 g/dL (ref 32.0–36.0)
MCV: 92.9 fL (ref 80.0–100.0)
MONOS PCT: 9 %
Monocytes Absolute: 1 10*3/uL — ABNORMAL HIGH (ref 0.2–0.9)
NEUTROS ABS: 7.1 10*3/uL — AB (ref 1.4–6.5)
NEUTROS PCT: 63 %
Platelets: 248 10*3/uL (ref 150–440)
RBC: 4.23 MIL/uL (ref 3.80–5.20)
RDW: 13.9 % (ref 11.5–14.5)
WBC: 11.2 10*3/uL — AB (ref 3.6–11.0)

## 2017-06-21 LAB — COMPREHENSIVE METABOLIC PANEL
ALBUMIN: 4.1 g/dL (ref 3.5–5.0)
ALK PHOS: 48 U/L (ref 38–126)
ALT: 16 U/L (ref 14–54)
ANION GAP: 9 (ref 5–15)
AST: 22 U/L (ref 15–41)
BILIRUBIN TOTAL: 0.4 mg/dL (ref 0.3–1.2)
BUN: 43 mg/dL — AB (ref 6–20)
CALCIUM: 9.2 mg/dL (ref 8.9–10.3)
CO2: 25 mmol/L (ref 22–32)
Chloride: 106 mmol/L (ref 101–111)
Creatinine, Ser: 1.83 mg/dL — ABNORMAL HIGH (ref 0.44–1.00)
GFR calc Af Amer: 29 mL/min — ABNORMAL LOW (ref >60)
GFR calc non Af Amer: 25 mL/min — ABNORMAL LOW (ref >60)
GLUCOSE: 152 mg/dL — AB (ref 65–99)
Potassium: 4.1 mmol/L (ref 3.5–5.1)
SODIUM: 140 mmol/L (ref 135–145)
TOTAL PROTEIN: 7.4 g/dL (ref 6.5–8.1)

## 2017-06-21 NOTE — Telephone Encounter (Signed)
Please see note below. 

## 2017-06-21 NOTE — ED Triage Notes (Signed)
Pt arrived to the ED accompanied by her daughter for complaints of right hip pain secondary to possible bleeding. Pt reports that she had 4 hip replacement surgeries on the affected hip and she has been experiencing internal hip bleeding since and has to come to the hospital to be aspirated. Pt states that she feels like when she has experienced hip bleeding before. Pt is AOx4 in no apparent distress.

## 2017-06-21 NOTE — Telephone Encounter (Signed)
Wanda Brooks from [facility] has a question regarding an electronic prescription that was sent in on 4/16 - question on quantity  Please call [phone]

## 2017-06-22 ENCOUNTER — Inpatient Hospital Stay (HOSPITAL_COMMUNITY)
Admission: AD | Admit: 2017-06-22 | Discharge: 2017-06-24 | DRG: 605 | Disposition: A | Discharge status: Home / Self Care | Payer: Medicare Other | Source: Other Acute Inpatient Hospital | Attending: Nephrology | Admitting: Nephrology

## 2017-06-22 ENCOUNTER — Inpatient Hospital Stay (HOSPITAL_COMMUNITY): Payer: Medicare Other

## 2017-06-22 ENCOUNTER — Emergency Department
Admission: EM | Admit: 2017-06-22 | Discharge: 2017-06-22 | Disposition: A | Discharge status: Other Acute Inpatient Hospital | Payer: Medicare Other | Attending: Emergency Medicine | Admitting: Emergency Medicine

## 2017-06-22 ENCOUNTER — Other Ambulatory Visit: Payer: Self-pay

## 2017-06-22 ENCOUNTER — Encounter (HOSPITAL_COMMUNITY): Payer: Self-pay

## 2017-06-22 DIAGNOSIS — M25551 Pain in right hip: Secondary | ICD-10-CM

## 2017-06-22 DIAGNOSIS — I481 Persistent atrial fibrillation: Secondary | ICD-10-CM | POA: Diagnosis present

## 2017-06-22 DIAGNOSIS — F419 Anxiety disorder, unspecified: Secondary | ICD-10-CM | POA: Diagnosis present

## 2017-06-22 DIAGNOSIS — I480 Paroxysmal atrial fibrillation: Secondary | ICD-10-CM | POA: Diagnosis present

## 2017-06-22 DIAGNOSIS — K219 Gastro-esophageal reflux disease without esophagitis: Secondary | ICD-10-CM | POA: Diagnosis present

## 2017-06-22 DIAGNOSIS — I130 Hypertensive heart and chronic kidney disease with heart failure and stage 1 through stage 4 chronic kidney disease, or unspecified chronic kidney disease: Secondary | ICD-10-CM | POA: Diagnosis present

## 2017-06-22 DIAGNOSIS — S7000XA Contusion of unspecified hip, initial encounter: Secondary | ICD-10-CM

## 2017-06-22 DIAGNOSIS — R0902 Hypoxemia: Secondary | ICD-10-CM

## 2017-06-22 DIAGNOSIS — S7001XA Contusion of right hip, initial encounter: Principal | ICD-10-CM | POA: Diagnosis present

## 2017-06-22 DIAGNOSIS — F17210 Nicotine dependence, cigarettes, uncomplicated: Secondary | ICD-10-CM | POA: Diagnosis present

## 2017-06-22 DIAGNOSIS — R0602 Shortness of breath: Secondary | ICD-10-CM | POA: Diagnosis not present

## 2017-06-22 DIAGNOSIS — F411 Generalized anxiety disorder: Secondary | ICD-10-CM | POA: Diagnosis present

## 2017-06-22 DIAGNOSIS — J449 Chronic obstructive pulmonary disease, unspecified: Secondary | ICD-10-CM | POA: Diagnosis present

## 2017-06-22 DIAGNOSIS — I2510 Atherosclerotic heart disease of native coronary artery without angina pectoris: Secondary | ICD-10-CM | POA: Diagnosis present

## 2017-06-22 DIAGNOSIS — M24451 Recurrent dislocation, right hip: Secondary | ICD-10-CM | POA: Diagnosis present

## 2017-06-22 DIAGNOSIS — X58XXXA Exposure to other specified factors, initial encounter: Secondary | ICD-10-CM | POA: Diagnosis present

## 2017-06-22 DIAGNOSIS — Z950 Presence of cardiac pacemaker: Secondary | ICD-10-CM

## 2017-06-22 DIAGNOSIS — N183 Chronic kidney disease, stage 3 (moderate): Secondary | ICD-10-CM | POA: Diagnosis present

## 2017-06-22 DIAGNOSIS — E785 Hyperlipidemia, unspecified: Secondary | ICD-10-CM | POA: Diagnosis present

## 2017-06-22 DIAGNOSIS — H9190 Unspecified hearing loss, unspecified ear: Secondary | ICD-10-CM | POA: Diagnosis present

## 2017-06-22 DIAGNOSIS — I10 Essential (primary) hypertension: Secondary | ICD-10-CM | POA: Diagnosis not present

## 2017-06-22 DIAGNOSIS — I482 Chronic atrial fibrillation: Secondary | ICD-10-CM | POA: Diagnosis present

## 2017-06-22 DIAGNOSIS — I5033 Acute on chronic diastolic (congestive) heart failure: Secondary | ICD-10-CM | POA: Diagnosis not present

## 2017-06-22 DIAGNOSIS — Z951 Presence of aortocoronary bypass graft: Secondary | ICD-10-CM | POA: Diagnosis not present

## 2017-06-22 DIAGNOSIS — Z96641 Presence of right artificial hip joint: Secondary | ICD-10-CM | POA: Diagnosis present

## 2017-06-22 DIAGNOSIS — Z471 Aftercare following joint replacement surgery: Secondary | ICD-10-CM | POA: Diagnosis not present

## 2017-06-22 DIAGNOSIS — Q211 Atrial septal defect: Secondary | ICD-10-CM

## 2017-06-22 DIAGNOSIS — M25451 Effusion, right hip: Secondary | ICD-10-CM | POA: Diagnosis not present

## 2017-06-22 DIAGNOSIS — E1122 Type 2 diabetes mellitus with diabetic chronic kidney disease: Secondary | ICD-10-CM | POA: Diagnosis present

## 2017-06-22 DIAGNOSIS — Y792 Prosthetic and other implants, materials and accessory orthopedic devices associated with adverse incidents: Secondary | ICD-10-CM | POA: Diagnosis not present

## 2017-06-22 DIAGNOSIS — I5032 Chronic diastolic (congestive) heart failure: Secondary | ICD-10-CM | POA: Diagnosis not present

## 2017-06-22 DIAGNOSIS — Z7901 Long term (current) use of anticoagulants: Secondary | ICD-10-CM

## 2017-06-22 DIAGNOSIS — N179 Acute kidney failure, unspecified: Secondary | ICD-10-CM | POA: Diagnosis present

## 2017-06-22 DIAGNOSIS — M25851 Other specified joint disorders, right hip: Secondary | ICD-10-CM | POA: Diagnosis not present

## 2017-06-22 DIAGNOSIS — Z886 Allergy status to analgesic agent status: Secondary | ICD-10-CM

## 2017-06-22 DIAGNOSIS — T84029A Dislocation of unspecified internal joint prosthesis, initial encounter: Secondary | ICD-10-CM | POA: Diagnosis not present

## 2017-06-22 DIAGNOSIS — Z833 Family history of diabetes mellitus: Secondary | ICD-10-CM

## 2017-06-22 DIAGNOSIS — T84090A Other mechanical complication of internal right hip prosthesis, initial encounter: Secondary | ICD-10-CM | POA: Diagnosis present

## 2017-06-22 DIAGNOSIS — M25051 Hemarthrosis, right hip: Secondary | ICD-10-CM

## 2017-06-22 DIAGNOSIS — F329 Major depressive disorder, single episode, unspecified: Secondary | ICD-10-CM | POA: Diagnosis present

## 2017-06-22 DIAGNOSIS — Z96649 Presence of unspecified artificial hip joint: Secondary | ICD-10-CM | POA: Diagnosis not present

## 2017-06-22 DIAGNOSIS — T148XXA Other injury of unspecified body region, initial encounter: Secondary | ICD-10-CM

## 2017-06-22 DIAGNOSIS — I272 Pulmonary hypertension, unspecified: Secondary | ICD-10-CM | POA: Diagnosis not present

## 2017-06-22 DIAGNOSIS — Z8249 Family history of ischemic heart disease and other diseases of the circulatory system: Secondary | ICD-10-CM

## 2017-06-22 DIAGNOSIS — R52 Pain, unspecified: Secondary | ICD-10-CM

## 2017-06-22 LAB — APTT: aPTT: 56 seconds — ABNORMAL HIGH (ref 24–36)

## 2017-06-22 LAB — CBC
HEMATOCRIT: 39.4 % (ref 36.0–46.0)
HEMOGLOBIN: 12.7 g/dL (ref 12.0–15.0)
MCH: 30.3 pg (ref 26.0–34.0)
MCHC: 32.2 g/dL (ref 30.0–36.0)
MCV: 94 fL (ref 78.0–100.0)
Platelets: 231 10*3/uL (ref 150–400)
RBC: 4.19 MIL/uL (ref 3.87–5.11)
RDW: 13.7 % (ref 11.5–15.5)
WBC: 11.2 10*3/uL — ABNORMAL HIGH (ref 4.0–10.5)

## 2017-06-22 LAB — BASIC METABOLIC PANEL
Anion gap: 12 (ref 5–15)
BUN: 39 mg/dL — ABNORMAL HIGH (ref 6–20)
CHLORIDE: 108 mmol/L (ref 101–111)
CO2: 19 mmol/L — AB (ref 22–32)
CREATININE: 1.87 mg/dL — AB (ref 0.44–1.00)
Calcium: 8.9 mg/dL (ref 8.9–10.3)
GFR calc non Af Amer: 24 mL/min — ABNORMAL LOW (ref >60)
GFR, EST AFRICAN AMERICAN: 28 mL/min — AB (ref >60)
Glucose, Bld: 125 mg/dL — ABNORMAL HIGH (ref 65–99)
POTASSIUM: 4.1 mmol/L (ref 3.5–5.1)
Sodium: 139 mmol/L (ref 135–145)

## 2017-06-22 LAB — PROTIME-INR
INR: 1.43
PROTHROMBIN TIME: 17.3 s — AB (ref 11.4–15.2)

## 2017-06-22 LAB — SURGICAL PCR SCREEN
MRSA, PCR: NEGATIVE
STAPHYLOCOCCUS AUREUS: NEGATIVE

## 2017-06-22 MED ORDER — MORPHINE SULFATE (PF) 2 MG/ML IV SOLN
2.0000 mg | Freq: Once | INTRAVENOUS | Status: AC
Start: 2017-06-22 — End: 2017-06-22
  Administered 2017-06-22: 2 mg via INTRAVENOUS
  Filled 2017-06-22: qty 1

## 2017-06-22 MED ORDER — AMIODARONE HCL 200 MG PO TABS
200.0000 mg | ORAL_TABLET | Freq: Every day | ORAL | Status: DC
  Administered 2017-06-22 – 2017-06-23 (×2): 200 mg via ORAL
  Filled 2017-06-22 (×2): qty 1

## 2017-06-22 MED ORDER — HYDRALAZINE HCL 10 MG PO TABS
10.0000 mg | ORAL_TABLET | Freq: Three times a day (TID) | ORAL | Status: DC
  Administered 2017-06-22 – 2017-06-24 (×8): 10 mg via ORAL
  Filled 2017-06-22 (×8): qty 1

## 2017-06-22 MED ORDER — METOPROLOL SUCCINATE ER 100 MG PO TB24
100.0000 mg | ORAL_TABLET | Freq: Every day | ORAL | Status: DC
  Administered 2017-06-22 – 2017-06-23 (×2): 100 mg via ORAL
  Filled 2017-06-22 (×2): qty 1

## 2017-06-22 MED ORDER — SENNA 8.6 MG PO TABS
1.0000 | ORAL_TABLET | Freq: Every day | ORAL | Status: DC
  Administered 2017-06-22 – 2017-06-24 (×3): 8.6 mg via ORAL
  Filled 2017-06-22 (×3): qty 1

## 2017-06-22 MED ORDER — DOCUSATE SODIUM 100 MG PO CAPS
100.0000 mg | ORAL_CAPSULE | Freq: Two times a day (BID) | ORAL | Status: DC
  Administered 2017-06-22 – 2017-06-24 (×5): 100 mg via ORAL
  Filled 2017-06-22 (×5): qty 1

## 2017-06-22 MED ORDER — LORAZEPAM 0.5 MG PO TABS
0.5000 mg | ORAL_TABLET | Freq: Two times a day (BID) | ORAL | Status: DC | PRN
  Administered 2017-06-23 – 2017-06-24 (×2): 0.5 mg via ORAL
  Filled 2017-06-22 (×2): qty 1

## 2017-06-22 MED ORDER — SODIUM CHLORIDE 0.9 % IV SOLN
INTRAVENOUS | Status: DC
  Administered 2017-06-22 – 2017-06-24 (×4): via INTRAVENOUS

## 2017-06-22 MED ORDER — AMIODARONE HCL 200 MG PO TABS: ORAL_TABLET | ORAL | 0 refills | Status: DC

## 2017-06-22 MED ORDER — MORPHINE SULFATE (PF) 4 MG/ML IV SOLN
2.0000 mg | INTRAVENOUS | Status: DC | PRN
  Administered 2017-06-22 – 2017-06-23 (×2): 2 mg via INTRAVENOUS
  Filled 2017-06-22 (×2): qty 1

## 2017-06-22 MED ORDER — HYDROCHLOROTHIAZIDE 12.5 MG PO CAPS
12.5000 mg | ORAL_CAPSULE | Freq: Every day | ORAL | Status: DC
  Administered 2017-06-22 – 2017-06-24 (×3): 12.5 mg via ORAL
  Filled 2017-06-22 (×3): qty 1

## 2017-06-22 MED ORDER — NICOTINE 14 MG/24HR TD PT24
14.0000 mg | MEDICATED_PATCH | Freq: Every day | TRANSDERMAL | Status: DC
  Administered 2017-06-22 – 2017-06-24 (×3): 14 mg via TRANSDERMAL
  Filled 2017-06-22 (×3): qty 1

## 2017-06-22 MED ORDER — VITAMIN D3 25 MCG (1000 UNIT) PO TABS
1000.0000 [IU] | ORAL_TABLET | Freq: Every day | ORAL | Status: DC
  Administered 2017-06-22 – 2017-06-24 (×3): 1000 [IU] via ORAL
  Filled 2017-06-22 (×3): qty 1

## 2017-06-22 MED ORDER — BISACODYL 5 MG PO TBEC
5.0000 mg | DELAYED_RELEASE_TABLET | Freq: Every day | ORAL | Status: DC | PRN
  Administered 2017-06-24: 5 mg via ORAL
  Filled 2017-06-22: qty 1

## 2017-06-22 MED ORDER — ISOSORBIDE MONONITRATE ER 60 MG PO TB24
120.0000 mg | ORAL_TABLET | Freq: Every day | ORAL | Status: DC
  Administered 2017-06-23 – 2017-06-24 (×2): 120 mg via ORAL
  Filled 2017-06-22 (×3): qty 2

## 2017-06-22 MED ORDER — FAMOTIDINE 20 MG PO TABS
20.0000 mg | ORAL_TABLET | Freq: Two times a day (BID) | ORAL | Status: DC
  Administered 2017-06-22 – 2017-06-24 (×5): 20 mg via ORAL
  Filled 2017-06-22 (×5): qty 1

## 2017-06-22 MED ORDER — ATORVASTATIN CALCIUM 20 MG PO TABS
20.0000 mg | ORAL_TABLET | Freq: Every day | ORAL | Status: DC
  Administered 2017-06-22 – 2017-06-23 (×2): 20 mg via ORAL
  Filled 2017-06-22 (×2): qty 1

## 2017-06-22 MED ORDER — AMLODIPINE BESYLATE 10 MG PO TABS
10.0000 mg | ORAL_TABLET | Freq: Every day | ORAL | Status: DC
  Administered 2017-06-22 – 2017-06-24 (×3): 10 mg via ORAL
  Filled 2017-06-22 (×3): qty 1

## 2017-06-22 NOTE — Plan of Care (Signed)
  Problem: Pain Managment: Goal: General experience of comfort will improve Outcome: Progressing   Problem: Safety: Goal: Ability to remain free from injury will improve Outcome: Progressing   

## 2017-06-22 NOTE — ED Notes (Signed)
Pt's daughter [name] signed Transfer consent per consent from pt.
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
## 2017-06-22 NOTE — Telephone Encounter (Signed)
I called and spoke with the pharmacy. Per the pharmacist, she is filling in today, but did see a note about the quantity on the RX for amiodarone. I confirmed that was incorrect, should have been for 84 tablets instead of 42 tablets. Per the pharmacist, they already filled the RX. I inquired as to why the RX was sent in last week on 4/16, but we didn't get called until 4/22 about the quantity error- the pharmacist could not answer the question.  She advised we re-submit the RX with an additional 42 tablets with notes to the pharmacist about what happened.  RX re-submitted.

## 2017-06-22 NOTE — Care Management (Addendum)
This is a no charge note  Transfer from [facility] per Dr. Karma Greaser  82 year old lady with a past medical history of hypertension, hyperlipidemia, COPD, GERD, depression, anxiety, dCHF, CAD, HOH, pacemaker placement, tobacco abuse, atrial fibrillation on Eliquis, who presents with right hip pain.  Pt had 4 times of right hip surgery for hip replacement. She has internal hip bleeding in that hip, which required aspiratiopn. Pt states that she feels like she has the same issue again. Per EDP, there is a 12 x 10 cm induration are in right hip, very consistent with hematoma, therefore no image was done. Pt was seen by Dr. Alvan Dame in the past. EDP consulted Dr. Alvan Dame who recommended to transfer patient to [place] and get IR consult in the morning. Dr. Alvan Dame will see pt in AM. Pt is accepted to tele bed as inpt. Asked EDP to hold off Eliquis.  Addendum:  I was called back by Dr. Karma Greaser, telling me that there is no tele bed available. We also do do not have tele in [facility]. Given her cardiac history including A. fib, CHF and pacemaker placement, it is inappropriate to put the patient in MedSurg bed.  Pt will need care from our facility. Dr. Alvan Dame knows this pt very well. To the best benefit of the patient, I will temporarily accept the patient to stepdown bed as inpatient.  Whenever telemetry bed is available, patient will be transferred out of stepdown uint.   Please call manager of [facility] at [phone] when pt arrives to floor   Ivor Costa, MD  [facility] Pager [phone]  If 7PM-7AM, please contact night-coverage [email or web address] Password [facility] 06/22/2017, 3:27 AM

## 2017-06-23 ENCOUNTER — Other Ambulatory Visit: Payer: Self-pay | Admitting: Internal Medicine

## 2017-06-23 ENCOUNTER — Inpatient Hospital Stay (HOSPITAL_COMMUNITY): Payer: Medicare Other

## 2017-06-23 ENCOUNTER — Other Ambulatory Visit: Payer: Self-pay

## 2017-06-23 DIAGNOSIS — I10 Essential (primary) hypertension: Secondary | ICD-10-CM

## 2017-06-23 DIAGNOSIS — I482 Chronic atrial fibrillation: Secondary | ICD-10-CM

## 2017-06-23 DIAGNOSIS — I272 Pulmonary hypertension, unspecified: Secondary | ICD-10-CM

## 2017-06-23 DIAGNOSIS — Z951 Presence of aortocoronary bypass graft: Secondary | ICD-10-CM

## 2017-06-23 DIAGNOSIS — Z96641 Presence of right artificial hip joint: Secondary | ICD-10-CM

## 2017-06-23 DIAGNOSIS — S7001XA Contusion of right hip, initial encounter: Principal | ICD-10-CM

## 2017-06-23 LAB — COMPREHENSIVE METABOLIC PANEL
ALBUMIN: 3.6 g/dL (ref 3.5–5.0)
ALT: 14 U/L (ref 14–54)
ANION GAP: 11 (ref 5–15)
AST: 18 U/L (ref 15–41)
Alkaline Phosphatase: 43 U/L (ref 38–126)
BUN: 32 mg/dL — ABNORMAL HIGH (ref 6–20)
CO2: 20 mmol/L — ABNORMAL LOW (ref 22–32)
Calcium: 8.7 mg/dL — ABNORMAL LOW (ref 8.9–10.3)
Chloride: 109 mmol/L (ref 101–111)
Creatinine, Ser: 1.43 mg/dL — ABNORMAL HIGH (ref 0.44–1.00)
GFR, EST AFRICAN AMERICAN: 39 mL/min — AB (ref >60)
GFR, EST NON AFRICAN AMERICAN: 34 mL/min — AB (ref >60)
GLUCOSE: 124 mg/dL — AB (ref 65–99)
POTASSIUM: 3.7 mmol/L (ref 3.5–5.1)
Sodium: 140 mmol/L (ref 135–145)
TOTAL PROTEIN: 6.7 g/dL (ref 6.5–8.1)
Total Bilirubin: 0.9 mg/dL (ref 0.3–1.2)

## 2017-06-23 LAB — SEDIMENTATION RATE: Sed Rate: 31 mm/hr — ABNORMAL HIGH (ref 0–22)

## 2017-06-23 LAB — C-REACTIVE PROTEIN: CRP: 2.1 mg/dL — ABNORMAL HIGH (ref <1.0)

## 2017-06-23 LAB — CBC
HCT: 37.8 % (ref 36.0–46.0)
Hemoglobin: 12.3 g/dL (ref 12.0–15.0)
MCH: 30.5 pg (ref 26.0–34.0)
MCHC: 32.5 g/dL (ref 30.0–36.0)
MCV: 93.8 fL (ref 78.0–100.0)
Platelets: 231 10*3/uL (ref 150–400)
RBC: 4.03 MIL/uL (ref 3.87–5.11)
RDW: 13.6 % (ref 11.5–15.5)
WBC: 11.6 10*3/uL — ABNORMAL HIGH (ref 4.0–10.5)

## 2017-06-23 MED ORDER — LIDOCAINE HCL 1 % IJ SOLN
INTRAMUSCULAR | Status: AC
  Filled 2017-06-23: qty 20

## 2017-06-23 MED ORDER — ATORVASTATIN CALCIUM 20 MG PO TABS: ORAL_TABLET | ORAL | 0 refills | Status: DC

## 2017-06-23 MED ORDER — STERILE WATER FOR INJECTION IJ SOLN
INTRAMUSCULAR | Status: AC
  Filled 2017-06-23: qty 10

## 2017-06-23 MED ORDER — IOPAMIDOL (ISOVUE-300) INJECTION 61%
50.0000 mL | Freq: Once | INTRAVENOUS | Status: AC | PRN
  Administered 2017-06-23: 5 mL

## 2017-06-23 MED ORDER — IOPAMIDOL (ISOVUE-300) INJECTION 61%
INTRAVENOUS | Status: AC
  Filled 2017-06-23: qty 50

## 2017-06-23 MED ORDER — ISOSORBIDE MONONITRATE ER 120 MG PO TB24: ORAL_TABLET | ORAL | 0 refills | Status: DC

## 2017-06-23 MED ORDER — OXYBUTYNIN CHLORIDE ER 5 MG PO TB24: 5.0000 mg | ORAL_TABLET | Freq: Every day | ORAL | 0 refills | Status: DC

## 2017-06-23 MED ORDER — MORPHINE SULFATE (PF) 4 MG/ML IV SOLN
1.0000 mg | INTRAVENOUS | Status: DC | PRN
  Administered 2017-06-23 – 2017-06-24 (×2): 2 mg via INTRAVENOUS
  Filled 2017-06-23 (×2): qty 1

## 2017-06-23 NOTE — Progress Notes (Signed)
     Subjective:      Patient reports pain as moderate, controlled at this time.  Dr. Alvan Dame did discuss with the patient about a plan of care.  She and her daughter state that the swelling has reoccurred which had previously diagnosed as a hematoma and aspirated.  Objective:   VITALS:   Vitals:   06/23/17 0554 06/23/17 1343  BP: (!) 168/54 (!) 176/57  Pulse: 63 60  Resp: 18 18  Temp: 98.6 F (37 C) 98.8 F (37.1 C)  SpO2: 93% 92%    Neurovascular intact Dorsiflexion/Plantar flexion intact No cellulitis present Right leg is mildly internally rotated  LABS Recent Labs    06/21/17 2137 06/22/17 0947 06/23/17 0423  HGB 13.4 12.7 12.3  HCT 39.3 39.4 37.8  WBC 11.2* 11.2* 11.6*  PLT 248 231 231    Recent Labs    06/21/17 2137 06/22/17 0947 06/23/17 0423  NA 140 139 140  K 4.1 4.1 3.7  BUN 43* 39* 32*  CREATININE 1.83* 1.87* 1.43*  GLUCOSE 152* 125* 124*     Assessment/Plan: Failure of previous rightTHA  Dr. Alvan Dame has discussed with the patient and daughter.  Discussed the need for revision surgery of the right hip. Imagine revealed a loose acetabular cup as well as 2 broken screws, which is a change since last x-rays.  We will obtain an aspiration of the right hip to rule out infection.  Patient does have plans for this weekend and if all is good, would prefer to go home and return for surgery.  Once the aspiration of the right hip is preformed we are ok with the patient being discharged home. We will have the scheduler in out office set her up for surgery on Monday 06/28/2017.          [place] Felicia Both   PAC  06/23/2017, 1:54 PM

## 2017-06-23 NOTE — Progress Notes (Signed)
  Request seen for aspiration/drain hematoma.  Imaging reviewed by Dr. Vernard Gambles.  Notes state Ortho has been consulted. No note from Ortho yet.  Dr. Vernard Gambles would like to wait on ortho recommendation before proceeding.  Melonee Gerstel S Yahira Timberman PA-C 06/23/2017 11:40 AM

## 2017-06-23 NOTE — Telephone Encounter (Signed)
[employer] sent r/x request for patient today requesting 90 day supply on amiodarone.    Patient is currently in the hospital and will defer to hospital and cardiology for any fills at discharge as meds may change.

## 2017-06-23 NOTE — Progress Notes (Signed)
Lab called once again to inquire about synovial fluid aspirated from hip, deferred call to [facility] for discussion. SRP, RN

## 2017-06-23 NOTE — Progress Notes (Signed)
Spoke with [facility] concerning aspirate fluid, lab inquired, cell count to be analyze per [employer]. SRP, RN

## 2017-06-23 NOTE — Telephone Encounter (Signed)
[employer] requesting 90 day fills on the following meds:  Oxybutynin 5mg  Isosorbide ER 120mg  Atorvastatin 20mg    Last OV 03/25/17 for pain management visit.  Has CPE scheduled for 07/01/17 however patient is currently in the hospital and likely will need hosp f/u visit next week instead.  LAST CPE 06/18/16.   Request denied for amiodarone as appears cardiology will manage that in the hospital and at discharge (refer to Uly[facility]ward last note regarding request).    Please review pended R/X's and let me know if you approve for 90days on each of these meds.    Thanks.

## 2017-06-23 NOTE — Progress Notes (Addendum)
[facility] Progress Note  Subjective: pt w/o sig complaints, daughter at bedside  Vitals:   06/22/17 1621 06/22/17 2055 06/23/17 0500 06/23/17 0554  BP: (!) 169/59 (!) 181/59  (!) 168/54  Pulse: 68 64  63  Resp: 16 18  18   Temp: 98.4 F (36.9 C) 98.9 F (37.2 C)  98.6 F (37 C)  TempSrc: Oral Oral  Oral  SpO2: 94% 98%  93%  Weight: 70.3 kg (154 lb 15.7 oz)  70.9 kg (156 lb 4.9 oz)   Height: 5\' 5"  (1.651 m)       Inpatient medications: . amiodarone  200 mg Oral QHS  . amLODipine  10 mg Oral Q breakfast  . atorvastatin  20 mg Oral q1800  . cholecalciferol  1,000 Units Oral Daily  . docusate sodium  100 mg Oral BID  . famotidine  20 mg Oral BID  . hydrALAZINE  10 mg Oral Q8H  . hydrochlorothiazide  12.5 mg Oral Daily  . isosorbide mononitrate  120 mg Oral Daily  . metoprolol succinate  100 mg Oral QHS  . nicotine  14 mg Transdermal Daily  . senna  1 tablet Oral Daily   . sodium chloride 75 mL/hr at 06/23/17 0906   bisacodyl, LORazepam, morphine injection  Exam: General: 82 year old Caucasian female well-developed well-nourished in no acute distress.  Alert and oriented x3. Eyes: Anicteric sclera. ENT: Mucous membranes dry with no erythema or exudates. Neck: No JVD or thyromegaly. Cardiovascular: Scar is noted on the chest from previous CABG.  Regular rate and rhythmwith no rubs or gallops. Respiratory: Clear to auscultation with no wheezes or rales. Abdomen: Soft nontender nondistended with normal bowel sounds x4 quadrants. Skin: No ulcerative lesions or rashes. Musculoskeletal: Significant tenderness and swelling on right hip, no sig erythema, fluctuance or drainage, +scar from prior surgery. No lower extremities edema. Psychiatric: Mood is appropriate for condition and setting. Neurologic: Alert and oriented x3.  No focal motor deficits.   Home meds: - eliquis 5 bid - amio 200 hs/ norvasc 10/ hydralazine 10 tid/ imdur 120 qd/ toprol xl 100 qd/ HCTZ 12.5 -  zantac/ ativan prn/ colace/ lipitor/ norco prn - vitamins/ prns    Brief Summary:  Wanda Brooks is a 82 y.o. female with hx CAF on eliquis, HTN, CAD sp CABG, COPD, chronic dCHF, smoker , hx multiple R hip surgery and recurrent hip bleeds/ hematomas presented to [facility] ED with complaints of right hip pain and swelling. Patient stated symptoms similar to prior right hip hematoma. Used a walker to ambulate and was in her usual state of health prior to this.  Denies falls or trauma. In the past pt has had aspiration of the hematoma usually by IR and usually Dr Alvan Dame is also notified per the daughter.  In ED,  There was induration in the right hip.  Ordered right hip x-ray.  Dr. Alvan Dame was called by the ED physician.  Vital signs remarkable for accelerated hypertension which may be contributed by pain.  Lab studies remarkable for AKI on CKD 3.  Patient accepted for admission to [facility] from [facility] for recurrent right hip hematoma.         Impression/Plan:  Active Problems:   Hematoma of right hip  1) Recurrent right hip hematoma - Right hip x-ray showed > "Fracture of the 2 anchoring screws at the acetabular cup of the RIGHT hip prosthesis with marked subluxation and interval rotation of the acetabular cup since the prior exam." - Dr.  Alvan Dame called, his PA is coming this am Highly appreciated. - Continue to hold Eliquis - Obtain CBC to monitor hemoglobin - Pain management in place - Bowel regimen to avoid opioid-induced constipation  2) CKD 3 Baseline creatinine 1.5 Creatinine on presentation 1.83 exam avoid nephrotoxic agents, hypertension, dehydration Repeat BMP today shows creat down 1.4  3) Accelerated hypertension - on multiple BP meds (3 + thiazide) - cont meds  - most likely related to pain - Pain management in place  4) Coronary artery disease status post CABG Denies chest pain Continue statin Continue monitoring on telemetry  5) Paroxysmal A. fib Rate  controlled on metoprolol Hold Eliquis due to possible procedure Also on amiodarone for rhythm control  6) Chronic diastolic CHF Last 2D echo done on 10/14/16 revealed LVEF 55% with moderate diastolic dysfunction and moderate pulmonary hypertension. - no sx's of decomp CHF on exam  PFO No acute issues  Moderate pulmonary hypertension No acute issues Defer to anesthesia for respiratory support prior to surgical procedure  GERD Stable Continue famotidine  Tobacco use disorder Tobacco cessation counseling done at bedside Nicotine patch  Generalized anxiety Continue p.o. Ativan as needed   DVT prophylaxis: SCDs  Code Status: Full code  Family Communication: Daughter at bedside  Disposition Plan: telemetry admit  Consults called: [facility]  Admission status: Inpatient     Kelly Splinter MD [facility] pgr [phone] 06/23/2017, 9:44 AM   Recent Labs  Lab 06/21/17 2137 06/22/17 0947 06/23/17 0423  NA 140 139 140  K 4.1 4.1 3.7  CL 106 108 109  CO2 25 19* 20*  GLUCOSE 152* 125* 124*  BUN 43* 39* 32*  CREATININE 1.83* 1.87* 1.43*  CALCIUM 9.2 8.9 8.7*   Recent Labs  Lab 06/21/17 2137 06/23/17 0423  AST 22 18  ALT 16 14  ALKPHOS 48 43  BILITOT 0.4 0.9  PROT 7.4 6.7  ALBUMIN 4.1 3.6   Recent Labs  Lab 06/21/17 2137 06/22/17 0947 06/23/17 0423  WBC 11.2* 11.2* 11.6*  NEUTROABS 7.1*  --   --   HGB 13.4 12.7 12.3  HCT 39.3 39.4 37.8  MCV 92.9 94.0 93.8  PLT 248 231 231   Iron/TIBC/Ferritin/ %Sat No results found for: IRON, TIBC, FERRITIN, IRONPCTSAT

## 2017-06-24 ENCOUNTER — Inpatient Hospital Stay (HOSPITAL_COMMUNITY): Payer: Medicare Other

## 2017-06-24 DIAGNOSIS — Z96641 Presence of right artificial hip joint: Secondary | ICD-10-CM

## 2017-06-24 DIAGNOSIS — N183 Chronic kidney disease, stage 3 (moderate): Secondary | ICD-10-CM

## 2017-06-24 DIAGNOSIS — T84029A Dislocation of unspecified internal joint prosthesis, initial encounter: Secondary | ICD-10-CM

## 2017-06-24 DIAGNOSIS — M25551 Pain in right hip: Secondary | ICD-10-CM

## 2017-06-24 DIAGNOSIS — Z96649 Presence of unspecified artificial hip joint: Secondary | ICD-10-CM

## 2017-06-24 DIAGNOSIS — I5033 Acute on chronic diastolic (congestive) heart failure: Secondary | ICD-10-CM

## 2017-06-24 MED ORDER — ELIQUIS 5 MG PO TABS: 5.0000 mg | ORAL_TABLET | Freq: Two times a day (BID) | ORAL | 3 refills | Status: DC

## 2017-06-24 MED ORDER — FUROSEMIDE 10 MG/ML IJ SOLN
20.0000 mg | Freq: Once | INTRAMUSCULAR | Status: AC
  Administered 2017-06-24: 20 mg via INTRAVENOUS
  Filled 2017-06-24: qty 2

## 2017-06-24 MED ORDER — ALBUTEROL SULFATE (2.5 MG/3ML) 0.083% IN NEBU: 2.5000 mg | INHALATION_SOLUTION | RESPIRATORY_TRACT | Status: DC | PRN

## 2017-06-24 MED ORDER — FUROSEMIDE 10 MG/ML IJ SOLN
40.0000 mg | Freq: Once | INTRAMUSCULAR | Status: AC
  Administered 2017-06-24: 40 mg via INTRAVENOUS
  Filled 2017-06-24: qty 4
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
## 2017-06-24 NOTE — Plan of Care (Signed)
Discharged instructions review with daughter. Verbalized understanding, daughter verbalized no Eliquis until after surgery Monday. RN transported patient to daughter's [facility] Lei to be taken home.Marland Kitchen

## 2017-06-24 NOTE — Discharge Summary (Addendum)
Physician Discharge Summary  Patient ID: Wanda Brooks MRN: [id number] DOB/AGE: 80/23/1937 80 y.o.  Admit date: 06/22/2017 Discharge date: 06/24/2017  Admission Diagnoses:    Hip pain    History of right hip replacement    Chronic atrial fibrillation    DM 2    HTN    CAD hx CABG    COPD    History of R hip hematoma aspiration     Discharge Diagnoses:     R Hip pain    History of right hip replacement    Acute on chronic diast CHF    Chronic atrial fibrillation    DM 2    HTN    CAD hx CABG    COPD      Discharged Condition: good  Hospital Course: Wanda Brooks a 82 y.o.femalewith hx CAF on eliquis, HTN, CAD sp CABG, COPD, chronic dCHF, smoker , hx multiple R hip surgery and recurrent hip bleeds/ hematomas presented to [facility] ED with complaints of right hip pain and swelling. Patient stated symptoms similar to prior right hip hematoma. Used a walker to ambulate and was in her usual state of health prior to this. Denies falls or trauma. In the past pt has had aspiration of the hematoma usually by IR and usually Dr Alvan Dame is also notified per the daughter.  In ED,  There wasinduration in the right hip. Ordered right hip x-ray. Dr. Alvan Dame was called by the ED physician. Vital signs remarkable for accelerated hypertension which may be contributed by pain. Lab studies remarkable for AKI on CKD 3. Patient accepted for admission to [facility] from [facility] for recurrent right hip hematoma.      Impression/Plan:  Active Problems: Right hip pain  1) Recurrent right hip pain/ hist of multiple R hip replacement surgeries - xray showed "fracture of the 2 anchoring screws at the acetabular cup of the RIGHT hip prosthesis with marked subluxation and interval rotation of the acetabular cup since the prior exam" - IR saw patient and did aspiration of R hip joint w/ no blood or pus returned.  They injected saline and sent a sample to lab of joint aspirate and Gram stain was  negative for WBC's or bacteria.  No signs of infection clinically.   - suspect R hip pain and swelling are due to the new abnormal findings on xray - seen by Ortho team, plan is for dc home and patient will be admitted early next week for hip surgery due to new pain and new xray findings - have d/w pateint and daughter and questions answered - she had CHF exacerbation due to fluid loading but responded w/ excellent diuresis and her SOB/ dyspnea is resolved and lungs are clear w/ no hypoxia on RA at the time of discharge this am  - spoke w Dr Alvan Dame, patient should continue to hold eliquis in anticipation of surgery early next week which is 4 days away   2) CKD 3 Baseline creatinine 1.5 Creatinine on presentation 1.83 exam avoid nephrotoxic agents, hypertension, dehydration Repeat BMP showed creat down 1.4  3) Acceleratedhypertension - on multiple BP meds (3 + thiazide) - cont meds    4) Coronary artery disease status post CABG Denies chest pain   5) Paroxysmal A. Fib - resume meds except continue to hold eliquis in preparation for the surgery next week  6) Chronic diastolic CHF Last 2D echo done on 8/15/18revealed LVEF 55% with moderate diastolic dysfunction and moderate pulmonary hypertension. - patient rec'd >  3 L IVF's and developed SOB, CXR showed decomp CHF/ pulm edema; she rec'd IV lasix and voided " a lot " overnight per the daughter and all her resp symptoms resolved, RA SpO2 was 95% at dc    7) PFO No acute issues  8) Moderate pulmonary hypertension No acute issues   9) GERD Stable   10) Tobacco use disorder Tobacco cessation counseling done at bedside   11) Generalized anxiety - stable    Code Status:Full code  Family Communication:Daughter at bedside  Consults called:Dr.OlinOrthopedic surgery   Discharge Exam: Blood pressure (!) 155/46, pulse (!) 59, temperature 98.4 F (36.9 C), temperature source Oral, resp. rate (!) 24, height 5'  5" (1.651 m), weight 71.9 kg (158 lb 8.2 oz), SpO2 95 %. General:82 year old Caucasian female well-developed well-nourished in no acute distress. Alert and oriented x3. Eyes:Anicteric sclera. EHU:DJSHFW membranes dry with no erythema or exudates. Neck:No JVD or thyromegaly. Cardiovascular:Scar is noted on the chest from previous CABG. Regular rate and rhythmwith no rubs or gallops. Respiratory:Clear to auscultation with no wheezes or rales. Abdomen:Soft nontender nondistended with normal bowel sounds x4 quadrants. Skin:No ulcerative lesions or rashes. Musculoskeletal:Significant tenderness and swelling on right hip, no sig erythema, fluctuance or drainage,+scar from prior surgery.No lower extremities edema. Psychiatric:Mood is appropriate for condition and setting. Neurologic:Alert and oriented x3. No focal motor deficits.  Disposition: Discharge disposition: 01-Home or Self Care        Allergies as of 06/24/2017      Reactions   Aspirin Other (See Comments)   Stomach burning  Can only take coated   Daypro [oxaprozin] Other (See Comments)   Dizziness Affects driving   Multaq [dronedarone] Diarrhea      Medication List    TAKE these medications   amiodarone 200 MG tablet Commonly known as:  PACERONE Take 2 tablets (400 mg) by mouth twice daily x 2 weeks, then decrease to 2 tablets (400 mg) by mouth once daily x 2 weeks   amLODipine 10 MG tablet Commonly known as:  NORVASC Take 1 tablet (10 mg total) by mouth daily with breakfast.   atorvastatin 20 MG tablet Commonly known as:  LIPITOR TAKE 1 TABLET(20 MG) BY MOUTH EVERY EVENING   cholecalciferol 1000 units tablet Commonly known as:  VITAMIN D Take 1,000 Units by mouth daily.   docusate sodium 100 MG capsule Commonly known as:  COLACE Take 100 mg by mouth 2 (two) times daily.   ELIQUIS 5 MG Tabs tablet Generic drug:  apixaban Take 1 tablet (5 mg total) by mouth 2 (two) times daily. Start taking  on:  06/29/2017 What changed:  These instructions start on 06/29/2017. If you are unsure what to do until then, ask your doctor or other care provider.   hydrALAZINE 10 MG tablet Commonly known as:  APRESOLINE TAKE 1 TABLET(10 MG) BY MOUTH EVERY 8 HOURS   hydrochlorothiazide 12.5 MG capsule Commonly known as:  MICROZIDE Take 1 capsule (12.5 mg total) by mouth daily.   HYDROcodone-acetaminophen 7.5-325 MG tablet Commonly known as:  NORCO Take 1 tablet by mouth 2 (two) times daily. What changed:    how much to take  when to take this  reasons to take this   isosorbide mononitrate 120 MG 24 hr tablet Commonly known as:  IMDUR TAKE 1 TABLET(120 MG) BY MOUTH DAILY WITH BREAKFAST   LORazepam 0.5 MG tablet Commonly known as:  ATIVAN TAKE 1 TABLET BY MOUTH EVERY 12 HOURS AS NEEDED   metoprolol succinate 100  MG 24 hr tablet Commonly known as:  TOPROL-XL TAKE 1 TABLET(100 MG) BY MOUTH TWICE DAILY   oxybutynin 5 MG 24 hr tablet Commonly known as:  DITROPAN-XL Take 1 tablet (5 mg total) by mouth at bedtime.   psyllium 58.6 % packet Commonly known as:  METAMUCIL Take 1 packet by mouth 2 (two) times daily.   ranitidine 150 MG tablet Commonly known as:  ZANTAC Take 150 mg by mouth 2 (two) times daily.        Signed: Sandy Salaam Jayla Mackie 06/24/2017, 11:08 AM

## 2017-06-25 ENCOUNTER — Encounter (HOSPITAL_COMMUNITY): Payer: Self-pay | Admitting: Anesthesiology

## 2017-06-25 ENCOUNTER — Other Ambulatory Visit: Payer: Self-pay

## 2017-06-25 ENCOUNTER — Encounter (HOSPITAL_COMMUNITY): Payer: Self-pay | Admitting: *Deleted

## 2017-06-25 NOTE — Progress Notes (Signed)
Spoke with dr Gifford Shave anesthesia and made aware patient medical history and last pacer check 09-22-16 and chest xray results from 06-24-17, patient needs medtronic pacer check prior to surgery 06-28-17, paged and spoke with medtronic rep Dannial Monarch and medtronic rep will come prior to 355 pm surgery on 06-28-17 and check medtronic pacer.

## 2017-06-27 LAB — BODY FLUID CULTURE
Culture: NO GROWTH
GRAM STAIN: NONE SEEN
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
## 2017-06-28 ENCOUNTER — Inpatient Hospital Stay (HOSPITAL_COMMUNITY): Admission: RE | Admit: 2017-06-28 | Payer: Medicare Other | Source: Ambulatory Visit | Admitting: Orthopedic Surgery

## 2017-06-28 HISTORY — DX: Dyspnea, unspecified: R06.00

## 2017-06-28 HISTORY — DX: Pneumonia, unspecified organism: J18.9

## 2017-06-28 SURGERY — TOTAL HIP REVISION
Anesthesia: Spinal | Laterality: Right

## 2017-06-28 NOTE — Progress Notes (Signed)
Patient's daughter Charleston Poot called in to say that patient had not stopped taking Eliquis as had been instructed.  Took last dose last night.  Drs. Alvan Dame and Joslin (MDA) notified, stated patient had to be off for 5 days to proceed with surgery.  Procedure cancelled today.  Patient to call Dr. Aurea Graff office to reschedule hopefully for next week.  Patient instructed to stop Eliquis for 5 days prior to OR date.

## 2017-06-30 ENCOUNTER — Telehealth: Payer: Self-pay | Admitting: *Deleted

## 2017-06-30 LAB — CUP PACEART INCLINIC DEVICE CHECK
Implantable Lead Implant Date: 20130529
Implantable Lead Location: 753859
Implantable Lead Location: 753860
Implantable Lead Model: 4074
MDC IDC LEAD IMPLANT DT: 20130529
MDC IDC PG IMPLANT DT: 20130529
MDC IDC SESS DTM: 20190501082658

## 2017-06-30 NOTE — Progress Notes (Signed)
Called [facility] and spoke with shequita at [facility] and made aware patient needs remote device check prior to surgery per dr Gifford Shave anesthesia. and she will call patient to do remote packemaker trnasmission today, left message for sherry wills  About remote pacemaker transmission needed per anesthesia.

## 2017-06-30 NOTE — Telephone Encounter (Signed)
Patient's in office device check from 06/15/17 has been documented, and the report will be scanned into "media". Mayer Camel, RN from [facility] and informed her of this. Ivin Booty verbalized understanding.   Device pre-op form completed and faxed.  Remote transmission is not needed at this time.   Ok to tell daughter to disregard vm if she returns call.

## 2017-06-30 NOTE — Telephone Encounter (Signed)
LMTCB/sss - patient needs to send remote transmission for upcoming surgery.  Ivin Booty, RN from [facility] called to see if patient could send remote transmission bc she has sx scheduled for 07/06/17, and anesthesia said that her last ppm check on file was too long ago.

## 2017-06-30 NOTE — Telephone Encounter (Signed)
lmtcb/sss 

## 2017-07-01 ENCOUNTER — Encounter: Payer: Medicare Other | Admitting: Internal Medicine

## 2017-07-06 ENCOUNTER — Other Ambulatory Visit: Payer: Self-pay | Admitting: *Deleted

## 2017-07-06 ENCOUNTER — Other Ambulatory Visit: Payer: Self-pay

## 2017-07-06 ENCOUNTER — Inpatient Hospital Stay (HOSPITAL_COMMUNITY)
Admission: RE | Admit: 2017-07-06 | Discharge: 2017-07-12 | DRG: 466 | Disposition: A | Discharge status: Skilled Nursing Facility | Payer: Medicare Other | Source: Ambulatory Visit | Attending: Orthopedic Surgery | Admitting: Orthopedic Surgery

## 2017-07-06 ENCOUNTER — Inpatient Hospital Stay (HOSPITAL_COMMUNITY): Payer: Medicare Other | Admitting: Anesthesiology

## 2017-07-06 ENCOUNTER — Inpatient Hospital Stay (HOSPITAL_COMMUNITY): Payer: Medicare Other

## 2017-07-06 ENCOUNTER — Telehealth: Payer: Self-pay | Admitting: Internal Medicine

## 2017-07-06 ENCOUNTER — Encounter (HOSPITAL_COMMUNITY): Payer: Self-pay

## 2017-07-06 ENCOUNTER — Encounter (HOSPITAL_COMMUNITY)
Admission: RE | Disposition: A | Discharge status: Skilled Nursing Facility | Payer: Self-pay | Source: Ambulatory Visit | Attending: Orthopedic Surgery

## 2017-07-06 DIAGNOSIS — I2510 Atherosclerotic heart disease of native coronary artery without angina pectoris: Secondary | ICD-10-CM

## 2017-07-06 DIAGNOSIS — M25562 Pain in left knee: Secondary | ICD-10-CM | POA: Diagnosis present

## 2017-07-06 DIAGNOSIS — Z96649 Presence of unspecified artificial hip joint: Secondary | ICD-10-CM

## 2017-07-06 DIAGNOSIS — Z6829 Body mass index (BMI) 29.0-29.9, adult: Secondary | ICD-10-CM

## 2017-07-06 DIAGNOSIS — R001 Bradycardia, unspecified: Secondary | ICD-10-CM | POA: Diagnosis not present

## 2017-07-06 DIAGNOSIS — I499 Cardiac arrhythmia, unspecified: Secondary | ICD-10-CM

## 2017-07-06 DIAGNOSIS — T84090A Other mechanical complication of internal right hip prosthesis, initial encounter: Principal | ICD-10-CM | POA: Diagnosis present

## 2017-07-06 DIAGNOSIS — R0602 Shortness of breath: Secondary | ICD-10-CM | POA: Diagnosis not present

## 2017-07-06 DIAGNOSIS — I480 Paroxysmal atrial fibrillation: Secondary | ICD-10-CM | POA: Diagnosis present

## 2017-07-06 DIAGNOSIS — I779 Disorder of arteries and arterioles, unspecified: Secondary | ICD-10-CM | POA: Diagnosis not present

## 2017-07-06 DIAGNOSIS — Z950 Presence of cardiac pacemaker: Secondary | ICD-10-CM | POA: Diagnosis not present

## 2017-07-06 DIAGNOSIS — Q211 Atrial septal defect: Secondary | ICD-10-CM

## 2017-07-06 DIAGNOSIS — Z7901 Long term (current) use of anticoagulants: Secondary | ICD-10-CM

## 2017-07-06 DIAGNOSIS — I340 Nonrheumatic mitral (valve) insufficiency: Secondary | ICD-10-CM | POA: Diagnosis present

## 2017-07-06 DIAGNOSIS — I272 Pulmonary hypertension, unspecified: Secondary | ICD-10-CM | POA: Diagnosis present

## 2017-07-06 DIAGNOSIS — I429 Cardiomyopathy, unspecified: Secondary | ICD-10-CM | POA: Diagnosis present

## 2017-07-06 DIAGNOSIS — I2583 Coronary atherosclerosis due to lipid rich plaque: Secondary | ICD-10-CM | POA: Diagnosis not present

## 2017-07-06 DIAGNOSIS — Z79899 Other long term (current) drug therapy: Secondary | ICD-10-CM

## 2017-07-06 DIAGNOSIS — I493 Ventricular premature depolarization: Secondary | ICD-10-CM | POA: Diagnosis present

## 2017-07-06 DIAGNOSIS — Z471 Aftercare following joint replacement surgery: Secondary | ICD-10-CM | POA: Diagnosis not present

## 2017-07-06 DIAGNOSIS — M2550 Pain in unspecified joint: Secondary | ICD-10-CM | POA: Diagnosis not present

## 2017-07-06 DIAGNOSIS — Y792 Prosthetic and other implants, materials and accessory orthopedic devices associated with adverse incidents: Secondary | ICD-10-CM | POA: Diagnosis present

## 2017-07-06 DIAGNOSIS — I5043 Acute on chronic combined systolic (congestive) and diastolic (congestive) heart failure: Secondary | ICD-10-CM | POA: Diagnosis not present

## 2017-07-06 DIAGNOSIS — J449 Chronic obstructive pulmonary disease, unspecified: Secondary | ICD-10-CM | POA: Diagnosis present

## 2017-07-06 DIAGNOSIS — I130 Hypertensive heart and chronic kidney disease with heart failure and stage 1 through stage 4 chronic kidney disease, or unspecified chronic kidney disease: Secondary | ICD-10-CM | POA: Diagnosis present

## 2017-07-06 DIAGNOSIS — E782 Mixed hyperlipidemia: Secondary | ICD-10-CM | POA: Diagnosis present

## 2017-07-06 DIAGNOSIS — E1122 Type 2 diabetes mellitus with diabetic chronic kidney disease: Secondary | ICD-10-CM | POA: Diagnosis present

## 2017-07-06 DIAGNOSIS — Z7401 Bed confinement status: Secondary | ICD-10-CM | POA: Diagnosis not present

## 2017-07-06 DIAGNOSIS — N183 Chronic kidney disease, stage 3 (moderate): Secondary | ICD-10-CM | POA: Diagnosis present

## 2017-07-06 DIAGNOSIS — I481 Persistent atrial fibrillation: Secondary | ICD-10-CM | POA: Diagnosis not present

## 2017-07-06 DIAGNOSIS — D72829 Elevated white blood cell count, unspecified: Secondary | ICD-10-CM | POA: Diagnosis present

## 2017-07-06 DIAGNOSIS — K219 Gastro-esophageal reflux disease without esophagitis: Secondary | ICD-10-CM | POA: Diagnosis present

## 2017-07-06 DIAGNOSIS — E785 Hyperlipidemia, unspecified: Secondary | ICD-10-CM | POA: Diagnosis not present

## 2017-07-06 DIAGNOSIS — H9190 Unspecified hearing loss, unspecified ear: Secondary | ICD-10-CM | POA: Diagnosis present

## 2017-07-06 DIAGNOSIS — I4892 Unspecified atrial flutter: Secondary | ICD-10-CM | POA: Diagnosis present

## 2017-07-06 DIAGNOSIS — F419 Anxiety disorder, unspecified: Secondary | ICD-10-CM | POA: Diagnosis not present

## 2017-07-06 DIAGNOSIS — E663 Overweight: Secondary | ICD-10-CM | POA: Diagnosis present

## 2017-07-06 DIAGNOSIS — I351 Nonrheumatic aortic (valve) insufficiency: Secondary | ICD-10-CM | POA: Diagnosis not present

## 2017-07-06 DIAGNOSIS — J329 Chronic sinusitis, unspecified: Secondary | ICD-10-CM | POA: Diagnosis not present

## 2017-07-06 DIAGNOSIS — H532 Diplopia: Secondary | ICD-10-CM | POA: Diagnosis present

## 2017-07-06 DIAGNOSIS — M1611 Unilateral primary osteoarthritis, right hip: Secondary | ICD-10-CM | POA: Diagnosis present

## 2017-07-06 DIAGNOSIS — F17210 Nicotine dependence, cigarettes, uncomplicated: Secondary | ICD-10-CM | POA: Diagnosis present

## 2017-07-06 DIAGNOSIS — I25119 Atherosclerotic heart disease of native coronary artery with unspecified angina pectoris: Secondary | ICD-10-CM | POA: Diagnosis not present

## 2017-07-06 DIAGNOSIS — Z96641 Presence of right artificial hip joint: Secondary | ICD-10-CM | POA: Diagnosis not present

## 2017-07-06 DIAGNOSIS — E1151 Type 2 diabetes mellitus with diabetic peripheral angiopathy without gangrene: Secondary | ICD-10-CM | POA: Diagnosis present

## 2017-07-06 DIAGNOSIS — R262 Difficulty in walking, not elsewhere classified: Secondary | ICD-10-CM | POA: Diagnosis not present

## 2017-07-06 DIAGNOSIS — F329 Major depressive disorder, single episode, unspecified: Secondary | ICD-10-CM | POA: Diagnosis present

## 2017-07-06 DIAGNOSIS — I509 Heart failure, unspecified: Secondary | ICD-10-CM | POA: Diagnosis not present

## 2017-07-06 DIAGNOSIS — Z87820 Personal history of traumatic brain injury: Secondary | ICD-10-CM | POA: Diagnosis not present

## 2017-07-06 DIAGNOSIS — D649 Anemia, unspecified: Secondary | ICD-10-CM | POA: Diagnosis not present

## 2017-07-06 DIAGNOSIS — Z951 Presence of aortocoronary bypass graft: Secondary | ICD-10-CM

## 2017-07-06 DIAGNOSIS — I5032 Chronic diastolic (congestive) heart failure: Secondary | ICD-10-CM | POA: Diagnosis not present

## 2017-07-06 DIAGNOSIS — R0989 Other specified symptoms and signs involving the circulatory and respiratory systems: Secondary | ICD-10-CM | POA: Diagnosis not present

## 2017-07-06 DIAGNOSIS — I739 Peripheral vascular disease, unspecified: Secondary | ICD-10-CM | POA: Diagnosis present

## 2017-07-06 DIAGNOSIS — R41841 Cognitive communication deficit: Secondary | ICD-10-CM | POA: Diagnosis not present

## 2017-07-06 DIAGNOSIS — I10 Essential (primary) hypertension: Secondary | ICD-10-CM | POA: Diagnosis not present

## 2017-07-06 DIAGNOSIS — M6281 Muscle weakness (generalized): Secondary | ICD-10-CM | POA: Diagnosis not present

## 2017-07-06 DIAGNOSIS — T84030D Mechanical loosening of internal right hip prosthetic joint, subsequent encounter: Secondary | ICD-10-CM | POA: Diagnosis not present

## 2017-07-06 DIAGNOSIS — T84091A Other mechanical complication of internal left hip prosthesis, initial encounter: Secondary | ICD-10-CM | POA: Diagnosis not present

## 2017-07-06 HISTORY — PX: ACETABULAR REVISION: SHX5712

## 2017-07-06 LAB — TYPE AND SCREEN
ABO/RH(D): A NEG
Antibody Screen: NEGATIVE

## 2017-07-06 SURGERY — REVISION, TOTAL ARTHROPLASTY, HIP, ACETABULAR COMPONENT
Anesthesia: Spinal | Site: Hip | Laterality: Right

## 2017-07-06 MED ORDER — SODIUM CHLORIDE 0.9 % IV SOLN
INTRAVENOUS | Status: DC
  Administered 2017-07-06: 15:00:00 via INTRAVENOUS

## 2017-07-06 MED ORDER — MIDAZOLAM HCL 2 MG/2ML IJ SOLN
INTRAMUSCULAR | Status: AC
  Filled 2017-07-06: qty 2

## 2017-07-06 MED ORDER — MAGNESIUM CITRATE PO SOLN: 1.0000 | Freq: Once | ORAL | Status: DC | PRN

## 2017-07-06 MED ORDER — ONDANSETRON HCL 4 MG/2ML IJ SOLN
4.0000 mg | Freq: Four times a day (QID) | INTRAMUSCULAR | Status: DC | PRN
  Administered 2017-07-09: 4 mg via INTRAVENOUS
  Filled 2017-07-06: qty 2

## 2017-07-06 MED ORDER — METOCLOPRAMIDE HCL 5 MG PO TABS: 5.0000 mg | ORAL_TABLET | Freq: Three times a day (TID) | ORAL | Status: DC | PRN

## 2017-07-06 MED ORDER — FENTANYL CITRATE (PF) 100 MCG/2ML IJ SOLN
INTRAMUSCULAR | Status: AC
  Filled 2017-07-06: qty 2

## 2017-07-06 MED ORDER — POLYETHYLENE GLYCOL 3350 17 G PO PACK
17.0000 g | PACK | Freq: Two times a day (BID) | ORAL | Status: DC
  Administered 2017-07-06 – 2017-07-12 (×11): 17 g via ORAL
  Filled 2017-07-06 (×12): qty 1

## 2017-07-06 MED ORDER — PROPOFOL 10 MG/ML IV BOLUS
INTRAVENOUS | Status: AC
  Filled 2017-07-06: qty 40

## 2017-07-06 MED ORDER — AMIODARONE HCL 200 MG PO TABS: 200.0000 mg | ORAL_TABLET | Freq: Every day | ORAL | 3 refills | Status: DC

## 2017-07-06 MED ORDER — PHENYLEPHRINE HCL 10 MG/ML IJ SOLN
INTRAVENOUS | Status: DC | PRN
  Administered 2017-07-06: 30 ug/min via INTRAVENOUS

## 2017-07-06 MED ORDER — ATORVASTATIN CALCIUM 20 MG PO TABS
20.0000 mg | ORAL_TABLET | Freq: Every day | ORAL | Status: DC
  Administered 2017-07-06 – 2017-07-12 (×7): 20 mg via ORAL
  Filled 2017-07-06 (×7): qty 1

## 2017-07-06 MED ORDER — AMLODIPINE BESYLATE 10 MG PO TABS
10.0000 mg | ORAL_TABLET | Freq: Every day | ORAL | Status: DC
  Administered 2017-07-08 – 2017-07-12 (×5): 10 mg via ORAL
  Filled 2017-07-06 (×6): qty 1

## 2017-07-06 MED ORDER — MENTHOL 3 MG MT LOZG
1.0000 | LOZENGE | OROMUCOSAL | Status: DC | PRN
  Filled 2017-07-06: qty 9

## 2017-07-06 MED ORDER — METHOCARBAMOL 500 MG PO TABS: 500.0000 mg | ORAL_TABLET | Freq: Four times a day (QID) | ORAL | Status: DC | PRN

## 2017-07-06 MED ORDER — BUPIVACAINE IN DEXTROSE 0.75-8.25 % IT SOLN
INTRATHECAL | Status: DC | PRN
  Administered 2017-07-06: 15 mg via INTRATHECAL

## 2017-07-06 MED ORDER — DEXAMETHASONE SODIUM PHOSPHATE 10 MG/ML IJ SOLN
10.0000 mg | Freq: Once | INTRAMUSCULAR | Status: AC
  Administered 2017-07-06: 10 mg via INTRAVENOUS

## 2017-07-06 MED ORDER — APIXABAN 2.5 MG PO TABS
2.5000 mg | ORAL_TABLET | Freq: Two times a day (BID) | ORAL | Status: DC
  Administered 2017-07-07 – 2017-07-12 (×11): 2.5 mg via ORAL
  Filled 2017-07-06 (×11): qty 1

## 2017-07-06 MED ORDER — PHENYLEPHRINE 40 MCG/ML (10ML) SYRINGE FOR IV PUSH (FOR BLOOD PRESSURE SUPPORT)
PREFILLED_SYRINGE | INTRAVENOUS | Status: DC | PRN
  Administered 2017-07-06 (×2): 80 ug via INTRAVENOUS

## 2017-07-06 MED ORDER — POLYETHYLENE GLYCOL 3350 17 G PO PACK: 17.0000 g | PACK | Freq: Two times a day (BID) | ORAL | 0 refills | Status: AC

## 2017-07-06 MED ORDER — CEFAZOLIN SODIUM-DEXTROSE 2-4 GM/100ML-% IV SOLN
2.0000 g | Freq: Four times a day (QID) | INTRAVENOUS | Status: AC
  Administered 2017-07-06 (×2): 2 g via INTRAVENOUS
  Filled 2017-07-06 (×2): qty 100

## 2017-07-06 MED ORDER — PROPOFOL 500 MG/50ML IV EMUL
INTRAVENOUS | Status: DC | PRN
  Administered 2017-07-06: 100 ug/kg/min via INTRAVENOUS

## 2017-07-06 MED ORDER — ALUM & MAG HYDROXIDE-SIMETH 200-200-20 MG/5ML PO SUSP
15.0000 mL | ORAL | Status: DC | PRN
  Administered 2017-07-08: 15 mL via ORAL
  Filled 2017-07-06: qty 30

## 2017-07-06 MED ORDER — HYDROCODONE-ACETAMINOPHEN 5-325 MG PO TABS
1.0000 | ORAL_TABLET | ORAL | Status: DC | PRN
  Administered 2017-07-06 – 2017-07-07 (×5): 1 via ORAL
  Filled 2017-07-06 (×5): qty 1

## 2017-07-06 MED ORDER — ACETAMINOPHEN 325 MG PO TABS
325.0000 mg | ORAL_TABLET | Freq: Four times a day (QID) | ORAL | Status: DC | PRN
  Administered 2017-07-10 – 2017-07-12 (×2): 650 mg via ORAL
  Filled 2017-07-06 (×2): qty 2

## 2017-07-06 MED ORDER — METOPROLOL SUCCINATE ER 100 MG PO TB24
100.0000 mg | ORAL_TABLET | Freq: Two times a day (BID) | ORAL | Status: DC
  Administered 2017-07-07 – 2017-07-12 (×10): 100 mg via ORAL
  Filled 2017-07-06: qty 1
  Filled 2017-07-06 (×2): qty 2
  Filled 2017-07-06: qty 1
  Filled 2017-07-06 (×2): qty 2
  Filled 2017-07-06 (×3): qty 1
  Filled 2017-07-06: qty 2
  Filled 2017-07-06: qty 1
  Filled 2017-07-06: qty 2

## 2017-07-06 MED ORDER — LACTATED RINGERS IV SOLN
INTRAVENOUS | Status: DC
  Administered 2017-07-06: 1000 mL via INTRAVENOUS
  Administered 2017-07-06: 12:00:00 via INTRAVENOUS

## 2017-07-06 MED ORDER — MIDAZOLAM HCL 5 MG/5ML IJ SOLN
INTRAMUSCULAR | Status: DC | PRN
  Administered 2017-07-06 (×2): 1 mg via INTRAVENOUS

## 2017-07-06 MED ORDER — SODIUM CHLORIDE 0.9 % IR SOLN
Status: DC | PRN
  Administered 2017-07-06 (×2): 1000 mL

## 2017-07-06 MED ORDER — PHENYLEPHRINE HCL 10 MG/ML IJ SOLN
INTRAMUSCULAR | Status: AC
  Filled 2017-07-06: qty 1

## 2017-07-06 MED ORDER — DOCUSATE SODIUM 100 MG PO CAPS: 100.0000 mg | ORAL_CAPSULE | Freq: Two times a day (BID) | ORAL | 0 refills | Status: AC

## 2017-07-06 MED ORDER — BISACODYL 10 MG RE SUPP: 10.0000 mg | Freq: Every day | RECTAL | Status: DC | PRN

## 2017-07-06 MED ORDER — FAMOTIDINE 20 MG PO TABS
20.0000 mg | ORAL_TABLET | Freq: Two times a day (BID) | ORAL | Status: DC
  Administered 2017-07-06 – 2017-07-08 (×4): 20 mg via ORAL
  Filled 2017-07-06 (×4): qty 1

## 2017-07-06 MED ORDER — METHOCARBAMOL 500 MG PO TABS: 500.0000 mg | ORAL_TABLET | Freq: Four times a day (QID) | ORAL | 0 refills | Status: DC | PRN

## 2017-07-06 MED ORDER — PROMETHAZINE HCL 25 MG/ML IJ SOLN: 6.2500 mg | INTRAMUSCULAR | Status: DC | PRN

## 2017-07-06 MED ORDER — METHOCARBAMOL 1000 MG/10ML IJ SOLN
500.0000 mg | Freq: Four times a day (QID) | INTRAVENOUS | Status: DC | PRN
  Administered 2017-07-06: 500 mg via INTRAVENOUS
  Filled 2017-07-06: qty 550

## 2017-07-06 MED ORDER — ISOSORBIDE MONONITRATE ER 60 MG PO TB24
120.0000 mg | ORAL_TABLET | Freq: Every day | ORAL | Status: DC
  Administered 2017-07-08 – 2017-07-12 (×5): 120 mg via ORAL
  Filled 2017-07-06 (×6): qty 2

## 2017-07-06 MED ORDER — HYDROCODONE-ACETAMINOPHEN 7.5-325 MG PO TABS: 1.0000 | ORAL_TABLET | ORAL | Status: DC | PRN

## 2017-07-06 MED ORDER — TRANEXAMIC ACID 1000 MG/10ML IV SOLN
1000.0000 mg | Freq: Once | INTRAVENOUS | Status: AC
  Administered 2017-07-06: 1000 mg via INTRAVENOUS
  Filled 2017-07-06: qty 1100

## 2017-07-06 MED ORDER — EPHEDRINE 5 MG/ML INJ
INTRAVENOUS | Status: AC
  Filled 2017-07-06: qty 10

## 2017-07-06 MED ORDER — LORAZEPAM 0.5 MG PO TABS
0.5000 mg | ORAL_TABLET | Freq: Two times a day (BID) | ORAL | Status: DC | PRN
  Administered 2017-07-12: 0.5 mg via ORAL
  Filled 2017-07-06 (×2): qty 1

## 2017-07-06 MED ORDER — PHENOL 1.4 % MT LIQD: 1.0000 | OROMUCOSAL | Status: DC | PRN

## 2017-07-06 MED ORDER — DIPHENHYDRAMINE HCL 12.5 MG/5ML PO ELIX: 12.5000 mg | ORAL_SOLUTION | ORAL | Status: DC | PRN

## 2017-07-06 MED ORDER — SODIUM CHLORIDE 0.9 % IV SOLN
1000.0000 mg | INTRAVENOUS | Status: AC
  Administered 2017-07-06: 1000 mg via INTRAVENOUS
  Filled 2017-07-06: qty 1100

## 2017-07-06 MED ORDER — DEXAMETHASONE SODIUM PHOSPHATE 10 MG/ML IJ SOLN
INTRAMUSCULAR | Status: AC
  Filled 2017-07-06: qty 1

## 2017-07-06 MED ORDER — PROPOFOL 10 MG/ML IV BOLUS
INTRAVENOUS | Status: AC
  Filled 2017-07-06: qty 20

## 2017-07-06 MED ORDER — HYDROCODONE-ACETAMINOPHEN 7.5-325 MG PO TABS: 1.0000 | ORAL_TABLET | ORAL | 0 refills | Status: DC | PRN

## 2017-07-06 MED ORDER — ONDANSETRON HCL 4 MG/2ML IJ SOLN
INTRAMUSCULAR | Status: DC | PRN
  Administered 2017-07-06: 4 mg via INTRAVENOUS

## 2017-07-06 MED ORDER — DEXAMETHASONE SODIUM PHOSPHATE 10 MG/ML IJ SOLN
10.0000 mg | Freq: Once | INTRAMUSCULAR | Status: AC
  Administered 2017-07-07: 10 mg via INTRAVENOUS
  Filled 2017-07-06: qty 1

## 2017-07-06 MED ORDER — OXYBUTYNIN CHLORIDE ER 5 MG PO TB24
5.0000 mg | ORAL_TABLET | Freq: Every day | ORAL | Status: DC
  Administered 2017-07-06 – 2017-07-11 (×6): 5 mg via ORAL
  Filled 2017-07-06 (×6): qty 1

## 2017-07-06 MED ORDER — HYDROMORPHONE HCL 1 MG/ML IJ SOLN
INTRAMUSCULAR | Status: AC
  Filled 2017-07-06: qty 1

## 2017-07-06 MED ORDER — METOCLOPRAMIDE HCL 5 MG/ML IJ SOLN: 5.0000 mg | Freq: Three times a day (TID) | INTRAMUSCULAR | Status: DC | PRN

## 2017-07-06 MED ORDER — 0.9 % SODIUM CHLORIDE (POUR BTL) OPTIME
TOPICAL | Status: DC | PRN
  Administered 2017-07-06: 1000 mL

## 2017-07-06 MED ORDER — ONDANSETRON HCL 4 MG/2ML IJ SOLN
INTRAMUSCULAR | Status: AC
  Filled 2017-07-06: qty 2

## 2017-07-06 MED ORDER — CEFAZOLIN SODIUM-DEXTROSE 2-4 GM/100ML-% IV SOLN
2.0000 g | Freq: Once | INTRAVENOUS | Status: AC
  Administered 2017-07-06: 2 g via INTRAVENOUS
  Filled 2017-07-06: qty 100

## 2017-07-06 MED ORDER — HYDROMORPHONE HCL 1 MG/ML IJ SOLN
0.2500 mg | INTRAMUSCULAR | Status: DC | PRN
  Administered 2017-07-06: 0.25 mg via INTRAVENOUS

## 2017-07-06 MED ORDER — STERILE WATER FOR IRRIGATION IR SOLN
Status: DC | PRN
  Administered 2017-07-06: 2000 mL

## 2017-07-06 MED ORDER — HYDRALAZINE HCL 10 MG PO TABS
10.0000 mg | ORAL_TABLET | Freq: Three times a day (TID) | ORAL | Status: DC
  Administered 2017-07-07 – 2017-07-12 (×16): 10 mg via ORAL
  Filled 2017-07-06 (×19): qty 1

## 2017-07-06 MED ORDER — FENTANYL CITRATE (PF) 100 MCG/2ML IJ SOLN
INTRAMUSCULAR | Status: DC | PRN
  Administered 2017-07-06: 50 ug via INTRAVENOUS

## 2017-07-06 MED ORDER — AMIODARONE HCL 200 MG PO TABS
200.0000 mg | ORAL_TABLET | Freq: Every day | ORAL | Status: DC
  Administered 2017-07-07 – 2017-07-12 (×6): 200 mg via ORAL
  Filled 2017-07-06 (×6): qty 1

## 2017-07-06 MED ORDER — CHLORHEXIDINE GLUCONATE 4 % EX LIQD: 60.0000 mL | Freq: Once | CUTANEOUS | Status: DC

## 2017-07-06 MED ORDER — FERROUS SULFATE 325 (65 FE) MG PO TABS
325.0000 mg | ORAL_TABLET | Freq: Three times a day (TID) | ORAL | Status: DC
  Administered 2017-07-07 – 2017-07-12 (×18): 325 mg via ORAL
  Filled 2017-07-06 (×18): qty 1

## 2017-07-06 MED ORDER — DOCUSATE SODIUM 100 MG PO CAPS
100.0000 mg | ORAL_CAPSULE | Freq: Two times a day (BID) | ORAL | Status: DC
  Administered 2017-07-06 – 2017-07-12 (×12): 100 mg via ORAL
  Filled 2017-07-06 (×12): qty 1

## 2017-07-06 MED ORDER — ONDANSETRON HCL 4 MG PO TABS: 4.0000 mg | ORAL_TABLET | Freq: Four times a day (QID) | ORAL | Status: DC | PRN

## 2017-07-06 MED ORDER — TRAMADOL HCL 50 MG PO TABS
50.0000 mg | ORAL_TABLET | Freq: Four times a day (QID) | ORAL | Status: DC
  Administered 2017-07-07 – 2017-07-12 (×22): 50 mg via ORAL
  Filled 2017-07-06 (×22): qty 1

## 2017-07-06 MED ORDER — MORPHINE SULFATE (PF) 2 MG/ML IV SOLN: 0.5000 mg | INTRAVENOUS | Status: DC | PRN

## 2017-07-06 MED ORDER — EPHEDRINE SULFATE-NACL 50-0.9 MG/10ML-% IV SOSY
PREFILLED_SYRINGE | INTRAVENOUS | Status: DC | PRN
  Administered 2017-07-06: 10 mg via INTRAVENOUS
  Administered 2017-07-06: 5 mg via INTRAVENOUS
  Administered 2017-07-06: 10 mg via INTRAVENOUS

## 2017-07-06 MED ORDER — FERROUS SULFATE 325 (65 FE) MG PO TABS: 325.0000 mg | ORAL_TABLET | Freq: Three times a day (TID) | ORAL | 3 refills | Status: DC

## 2017-07-06 SURGICAL SUPPLY — 64 items
BAG ZIPLOCK 12X15 (MISCELLANEOUS) ×3 IMPLANT
BLADE SAW SGTL 11.0X1.19X90.0M (BLADE) IMPLANT
BLADE SAW SGTL 18X1.27X75 (BLADE) IMPLANT
BLADE SAW SGTL 18X1.27X75MM (BLADE)
BRUSH FEMORAL CANAL (MISCELLANEOUS) IMPLANT
COVER SURGICAL LIGHT HANDLE (MISCELLANEOUS) ×3 IMPLANT
DERMABOND ADVANCED (GAUZE/BANDAGES/DRESSINGS) ×2
DERMABOND ADVANCED .7 DNX12 (GAUZE/BANDAGES/DRESSINGS) ×1 IMPLANT
DRAPE ORTHO SPLIT 77X108 STRL (DRAPES) ×4
DRAPE POUCH INSTRU U-SHP 10X18 (DRAPES) ×3 IMPLANT
DRAPE SURG 17X11 SM STRL (DRAPES) ×3 IMPLANT
DRAPE SURG ORHT 6 SPLT 77X108 (DRAPES) ×2 IMPLANT
DRAPE U-SHAPE 47X51 STRL (DRAPES) ×3 IMPLANT
DRSG AQUACEL AG ADV 3.5X10 (GAUZE/BANDAGES/DRESSINGS) IMPLANT
DRSG AQUACEL AG ADV 3.5X14 (GAUZE/BANDAGES/DRESSINGS) ×3 IMPLANT
DURAPREP 26ML APPLICATOR (WOUND CARE) ×3 IMPLANT
ELECT BLADE TIP CTD 4 INCH (ELECTRODE) ×3 IMPLANT
ELECT REM PT RETURN 15FT ADLT (MISCELLANEOUS) ×3 IMPLANT
EVACUATOR 1/8 PVC DRAIN (DRAIN) IMPLANT
FACESHIELD WRAPAROUND (MASK) ×15 IMPLANT
GLOVE BIOGEL M 7.0 STRL (GLOVE) IMPLANT
GLOVE BIOGEL PI IND STRL 7.5 (GLOVE) ×1 IMPLANT
GLOVE BIOGEL PI IND STRL 8.5 (GLOVE) ×1 IMPLANT
GLOVE BIOGEL PI INDICATOR 7.5 (GLOVE) ×2
GLOVE BIOGEL PI INDICATOR 8.5 (GLOVE) ×2
GLOVE ECLIPSE 8.0 STRL XLNG CF (GLOVE) ×9 IMPLANT
GLOVE ORTHO TXT STRL SZ7.5 (GLOVE) ×9 IMPLANT
GOWN STRL REUS W/TWL LRG LVL3 (GOWN DISPOSABLE) ×3 IMPLANT
GOWN STRL REUS W/TWL XL LVL3 (GOWN DISPOSABLE) ×3 IMPLANT
HANDPIECE INTERPULSE COAX TIP (DISPOSABLE) ×2
HEAD BIOLOX DELTA 36 P6 NECK (Head) ×3 IMPLANT
IMMOBILIZER KNEE 20 (SOFTGOODS) ×3
IMMOBILIZER KNEE 20 THIGH 36 (SOFTGOODS) ×1 IMPLANT
KIT BASIN OR (CUSTOM PROCEDURE TRAY) ×3 IMPLANT
LINER HIP ACET POLY 36X66 (Orthopedic Implant) ×3 IMPLANT
MANIFOLD NEPTUNE II (INSTRUMENTS) ×3 IMPLANT
NS IRRIG 1000ML POUR BTL (IV SOLUTION) ×6 IMPLANT
PACK TOTAL JOINT (CUSTOM PROCEDURE TRAY) ×3 IMPLANT
POSITIONER SURGICAL ARM (MISCELLANEOUS) ×3 IMPLANT
PRESSURIZER FEMORAL UNIV (MISCELLANEOUS) IMPLANT
SCREW 6.5MMX30MM (Screw) ×3 IMPLANT
SCREW 6.5MMX35MM (Screw) ×3 IMPLANT
SCREW TPR HD 5.0MM DIA 50 (Screw) ×3 IMPLANT
SET HNDPC FAN SPRY TIP SCT (DISPOSABLE) ×1 IMPLANT
SHELL ACET REV 72 LINER HIP (Orthopedic Implant) ×3 IMPLANT
SPONGE LAP 18X18 RF (DISPOSABLE) IMPLANT
SPONGE LAP 4X18 RFD (DISPOSABLE) IMPLANT
STAPLER VISISTAT 35W (STAPLE) IMPLANT
SUCTION FRAZIER HANDLE 10FR (MISCELLANEOUS)
SUCTION FRAZIER HANDLE 12FR (TUBING) ×2
SUCTION TUBE FRAZIER 10FR DISP (MISCELLANEOUS) IMPLANT
SUCTION TUBE FRAZIER 12FR DISP (TUBING) ×1 IMPLANT
SUT MNCRL AB 3-0 PS2 18 (SUTURE) ×3 IMPLANT
SUT STRATAFIX PDS+ 0 24IN (SUTURE) ×3 IMPLANT
SUT VIC AB 1 CT1 36 (SUTURE) ×6 IMPLANT
SUT VIC AB 2-0 CT1 27 (SUTURE) ×6
SUT VIC AB 2-0 CT1 TAPERPNT 27 (SUTURE) ×3 IMPLANT
SUT VLOC 180 0 24IN GS25 (SUTURE) IMPLANT
TOWEL OR 17X26 10 PK STRL BLUE (TOWEL DISPOSABLE) ×3 IMPLANT
TOWEL OR NON WOVEN STRL DISP B (DISPOSABLE) ×3 IMPLANT
TOWER CARTRIDGE SMART MIX (DISPOSABLE) IMPLANT
TRAY FOLEY CATH 14FRSI W/METER (CATHETERS) ×3 IMPLANT
TRAY FOLEY W/METER SILVER 16FR (SET/KITS/TRAYS/PACK) IMPLANT
WATER STERILE IRR 1000ML POUR (IV SOLUTION) ×3 IMPLANT

## 2017-07-06 NOTE — Plan of Care (Signed)
Plan of care 

## 2017-07-06 NOTE — Transfer of Care (Signed)
Immediate Anesthesia Transfer of Care Note  Patient: Wanda Brooks  Procedure(s) Performed: Revision right total hip acetabulum- posterior (Right Hip)  Patient Location: PACU  Anesthesia Type:Spinal  Level of Consciousness: sedated  Airway & Oxygen Therapy: Patient Spontanous Breathing and Patient connected to face mask oxygen  Post-op Assessment: Report given to RN and Post -op Vital signs reviewed and stable  Post vital signs: Reviewed and stable  Last Vitals:  Vitals Value Taken Time  BP    Temp    Pulse 67 07/06/2017  1:17 PM  Resp    SpO2 100 % 07/06/2017  1:17 PM  Vitals shown include unvalidated device data.  Last Pain:  Vitals:   07/06/17 1017  TempSrc:   PainSc: 2          Complications: No apparent anesthesia complications

## 2017-07-06 NOTE — Interval H&P Note (Signed)
History and Physical Interval Note:  07/06/2017 9:53 AM  Wanda Brooks  has presented today for surgery, with the diagnosis of Failed Right total hip acetabulum  The various methods of treatment have been discussed with the patient and family. After consideration of risks, benefits and other options for treatment, the patient has consented to  Procedure(s): Revision right total hip acetabulum- posterior (Right) as a surgical intervention .  The patient's history has been reviewed, patient examined, no change in status, stable for surgery.  I have reviewed the patient's chart and labs.  Questions were answered to the patient's satisfaction.     [name]

## 2017-07-06 NOTE — Anesthesia Procedure Notes (Signed)
Spinal  Patient location during procedure: OR Start time: 07/06/2017 11:08 AM End time: 07/06/2017 11:10 AM Staffing Resident/CRNA: Lind Covert, CRNA Performed: resident/CRNA  Preanesthetic Checklist Completed: patient identified, site marked, surgical consent, pre-op evaluation, timeout performed, IV checked, risks and benefits discussed and monitors and equipment checked Spinal Block Patient position: sitting Prep: Betadine Patient monitoring: heart rate, cardiac monitor, continuous pulse ox and blood pressure Approach: midline Location: L3-4 Needle Needle type: Pencan  Needle gauge: 24 G Needle length: 10 cm Needle insertion depth: 7 cm Assessment Sensory level: T6 Additional Notes Timeout performed. SAB kit date checked. SAB without difficulty

## 2017-07-06 NOTE — Op Note (Signed)
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  PROCEDURE:  Right total hip arthroplasty with revision of the acetabular component using a size 72 mm DePuy revision Pinnacle shell with 2 dome screws and 1 peripheral screw;  A 36+1 face changing 10 degree liner with the lipped portion at approximately  10:00 on this right hip; A size 36+6 ceramic ball from [employer] to match the previously placed stem with a type 1 taper.  SURGEON:  Pietro Cassis. Alvan Dame, MD  ASSISTANT:  Danae Orleans PA-C.  Note that Mr. Wanda Brooks was present for the entirety of the case and preoperative positioning, perioperative management of the operative extremity, general facilitation of the case and primary wound closure.  ANESTHESIA:  Spinal.  SPECIMENS:  None.  COMPLICATIONS:  None.  ESTIMATED BLOOD LOSS:  200 mL.    INDICATIONS:  The patient is a 82 year old female with longstanding challenges regarding this right hip.  She has recently undergone revision surgery due to instability.  She is also noted to have very poor bone stock within the acetabulum including  medial wall fracture with no evidence of dissociation of the pelvis.  She had been doing fairly well and with intermittent challenges with the right hip after I had performed the last revision surgery.  Recently, she presented with increasing pain and dysfunction.  Radiographs revealed an dissociated acetabular component  from the pelvis.  This was a clear indication for surgery.  Unfortunately, she was scheduled for  surgery last week, but did not stop her Eliquis and subsequently was canceled and postponed until today.  Risks and benefits of the procedure were discussed  and reviewed.  The obvious risk of failure of bony ingrowth the need for future surgery discussed, risks of infection, DVT component failure.  Risks associated with her Eliquis use and recurrent hematomas discussed and this was dealt with in the past as  well.  Consent was obtained for benefit of pain relief and function.  DESCRIPTION OF PROCEDURE:  The patient was brought to the operative theater.  Once adequate anesthesia was obtained, preoperative antibiotics administered as well as tranexamic acid and Decadron, she was positioned into the left lateral decubitus  position with the right hip up.  The right lower extremity was then prepped and draped in sterile fashion.  Timeout was performed identifying the patient, the planned procedure and extremity.  The patient's old incision was identified and a portion of it utilized for the procedure.  The skin was excised.  Soft tissue planes were created.  As I got through the dermal layer, we identified a pseudocapsular area where there was old hematoma, which  was evacuated.  I removed the pseudocapsular lining of this very area to try to allow for better healing.  The iliotibial band and gluteal fascia were then split for posterior approach to the hip.  Again, we encountered again some pseudocapsulization of  the soft tissues related to the retained hematoma where the hip and the acetabular component and liner were obviously dissociated from the pelvis.  We used a bone tamp to remove the metal ball from the trunnion and then I  was able just to remove the  acetabular shell with a rongeur.  The trunnion and the hip were then placed onto the ilium and retracted anteriorly.  I debrided using a curet initially the acetabulum, identifying bony landmarks still present.  She had very little posterior wall at  all.  Not until around the 10 or 11  o'clock was there any posterior ilium bone present.  Her ischium was intact.  There was a crack in the pelvis, but there was no evidence of dissociation as the medial wall had been compromised all along.  Anterior bone stock was relatively intact.  So I did feel that we would at least get the 3 points of fixation that we would need.  Once I debrided and identified the anatomic landmarks, I began identifying the reamers.  I ended up reaming to a 71 reamer  where I felt I was getting good circumferential fit from the area  aforementioned.  We thus selected a 72 Pinnacle shell.  This final shell was impacted.  I was able to get some scratch fit and it appeared to be seated.  I was able then to get 2 dome  screws placed into the ilium with very good purchase as it seems that one of the screws actually was a cortical type screw.  I did place a third peripheral screw in the anterior aspect of the cup as this was the only area where I could get some primary  bony purchase.  At this point, I chose to do trial reductions.  As the cup position appeared to have about 10 degrees to 15 degrees of anteversion, I would have liked a little bit more, but there was no bone stock to get this purchased appropriately.  I  thus selected a 10 degree face changing liner.  We did a trial reduction with a 36+6 trial and found that the combined anteversion was 50 degrees.  Leg lengths appeared to be improved and the hip was stable without significant evidence of subluxation  with forward flexion and internal rotation.  Given these findings, the trial components were removed.  The final 36+4 face changing 10 degree liner was then impacted again with the lip portion around 10 degrees from the right hip.  I then selected a 36+6  ball available through [employer] as a delta ceramic ball.  The metal balls were not available for [place] today.  Final 36+6 ceramic ball was impacted onto the trunnion and the hip  was reduced.  We irrigated the hip throughout the case and again at this point.  There was no capsular tissue to repair based on her previous histories and the disassociation of her  acetabular component pretty much destroyed all that soft tissue.  The iliotibial band and gluteal fascia were reapproximated in a near watertight seal using #1 Vicryl and #1 Stratafix suture.  The remainder of the wound was closed with 2-0 Vicryl and  running Monocryl.  The hip was then cleaned, dried and dressed sterilely using surgical glue and Aquacel dressing. She was then brought to the recovery room in stable condition tolerating the procedure well.  The findings were reviewed with her daughter.  Postoperatively, we will have her be partial weightbearing on this right lower extremity for up to six to eight weeks to allow for ingrowth.  We will have her be mindful of the posterior precautions, even using the knee immobilizer for some time.  This  will probably pose a challenge for mobility due to  some pain in her left knee for which this may require short-term nursing facility care.  AN/NUANCE  D:07/06/2017 T:07/06/2017 JOB:[id number]

## 2017-07-06 NOTE — Anesthesia Preprocedure Evaluation (Signed)
Anesthesia Evaluation  Patient identified by MRN, date of birth, ID band Patient awake    Reviewed: Allergy & Precautions, NPO status , Patient's Chart, lab work & pertinent test results  Airway Mallampati: II  TM Distance: >3 FB Neck ROM: Full    Dental no notable dental hx.    Pulmonary COPD, Current Smoker,    Pulmonary exam normal breath sounds clear to auscultation       Cardiovascular hypertension, + CAD, + CABG and + Peripheral Vascular Disease  Normal cardiovascular exam+ pacemaker + Valvular Problems/Murmurs MR  Rhythm:Regular Rate:Normal     Neuro/Psych negative neurological ROS  negative psych ROS   GI/Hepatic Neg liver ROS, GERD  ,  Endo/Other  diabetes  Renal/GU negative Renal ROS  negative genitourinary   Musculoskeletal negative musculoskeletal ROS (+)   Abdominal   Peds negative pediatric ROS (+)  Hematology negative hematology ROS (+)   Anesthesia Other Findings   Reproductive/Obstetrics negative OB ROS                             Anesthesia Physical Anesthesia Plan  ASA: III  Anesthesia Plan: Spinal   Post-op Pain Management:    Induction: Intravenous  PONV Risk Score and Plan: 2 and Ondansetron and Dexamethasone  Airway Management Planned: Simple Face Mask  Additional Equipment:   Intra-op Plan:   Post-operative Plan:   Informed Consent: I have reviewed the patients History and Physical, chart, labs and discussed the procedure including the risks, benefits and alternatives for the proposed anesthesia with the patient or authorized representative who has indicated his/her understanding and acceptance.   Dental advisory given  Plan Discussed with: CRNA and Surgeon  Anesthesia Plan Comments:         Anesthesia Quick Evaluation

## 2017-07-06 NOTE — Telephone Encounter (Signed)
Copied from [place] [phone]. Topic: Quick Communication - See Telephone Encounter >> Jul 06, 2017  1:37 PM Hewitt Shorts wrote: Pt is needing a refill on her hydrocodone   [facility] [phone]  [facility]

## 2017-07-06 NOTE — Brief Op Note (Signed)
07/06/2017  12:49 PM  PATIENT:  Wanda Brooks  82 y.o. female  PRE-OPERATIVE DIAGNOSIS:  Failed Right total hip acetabulum  POST-OPERATIVE DIAGNOSIS:  Failed Right total hip acetabulum  PROCEDURE:  Procedure(s): Revision right total hip acetabulum- posterior (Right)  SURGEON:  Surgeon(s) and Role:    Paralee Cancel, MD - Primary  PHYSICIAN ASSISTANT:  Danae Orleans, PA-C  ANESTHESIA:   spinal  EBL:  200 mL   BLOOD ADMINISTERED:none  DRAINS: none   LOCAL MEDICATIONS USED:  NONE  SPECIMEN:  No Specimen  DISPOSITION OF SPECIMEN:  N/A  COUNTS:  YES  TOURNIQUET:  * No tourniquets in log *  DICTATION: .Other Dictation: Dictation Number [id number]  PLAN OF CARE: Admit to inpatient   PATIENT DISPOSITION:  PACU - hemodynamically stable.   Delay start of Pharmacological VTE agent (>24hrs) due to surgical blood loss or risk of bleeding: no

## 2017-07-07 DIAGNOSIS — E663 Overweight: Secondary | ICD-10-CM | POA: Diagnosis present

## 2017-07-07 LAB — CBC
HEMATOCRIT: 30 % — AB (ref 36.0–46.0)
HEMOGLOBIN: 9.9 g/dL — AB (ref 12.0–15.0)
MCH: 30.7 pg (ref 26.0–34.0)
MCHC: 33 g/dL (ref 30.0–36.0)
MCV: 92.9 fL (ref 78.0–100.0)
Platelets: 220 10*3/uL (ref 150–400)
RBC: 3.23 MIL/uL — ABNORMAL LOW (ref 3.87–5.11)
RDW: 13.5 % (ref 11.5–15.5)
WBC: 11.8 10*3/uL — ABNORMAL HIGH (ref 4.0–10.5)

## 2017-07-07 LAB — BASIC METABOLIC PANEL
Anion gap: 9 (ref 5–15)
BUN: 30 mg/dL — AB (ref 6–20)
CHLORIDE: 109 mmol/L (ref 101–111)
CO2: 19 mmol/L — AB (ref 22–32)
CREATININE: 1.53 mg/dL — AB (ref 0.44–1.00)
Calcium: 7.8 mg/dL — ABNORMAL LOW (ref 8.9–10.3)
GFR calc Af Amer: 36 mL/min — ABNORMAL LOW (ref >60)
GFR calc non Af Amer: 31 mL/min — ABNORMAL LOW (ref >60)
Glucose, Bld: 134 mg/dL — ABNORMAL HIGH (ref 65–99)
Potassium: 4.2 mmol/L (ref 3.5–5.1)
Sodium: 137 mmol/L (ref 135–145)

## 2017-07-07 MED ORDER — METHYLPREDNISOLONE ACETATE 40 MG/ML IJ SUSP
80.0000 mg | Freq: Once | INTRAMUSCULAR | Status: DC
  Filled 2017-07-07: qty 2

## 2017-07-07 MED ORDER — LIDOCAINE HCL 0.5 % IJ SOLN
10.0000 mL | Freq: Once | INTRAMUSCULAR | Status: DC
  Filled 2017-07-07 (×2): qty 10

## 2017-07-07 NOTE — Evaluation (Signed)
Physical Therapy Evaluation Patient Details Name: Wanda Brooks MRN: [id number] DOB: [birth year] Today's Date: 07/07/2017   History of Present Illness  Pt is a 82 year old female s/p revision right total hip acetabulum- posterior with PMHx signficant for but not limited to CAD, COPD, CHF, CABG, MI, DM, pacemaker, multiple R hip surgeries  Clinical Impression  Patient is s/p above surgery resulting in functional limitations due to the deficits listed below (see PT Problem List).  Patient will benefit from skilled PT to increase their independence and safety with mobility to allow discharge to the venue listed below.   Pt educated on posterior hip precautions and PWB status.  Pt reports difficulty mobilizing prior to surgery.  Recommend SNF upon d/c.     Follow Up Recommendations SNF    Equipment Recommendations  Rolling walker with 5" wheels    Recommendations for Other Services       Precautions / Restrictions Precautions Precautions: Posterior Hip;Fall Precaution Comments: reviewed posterior hip precautions and provided handout Required Braces or Orthoses: Knee Immobilizer - Right Restrictions Weight Bearing Restrictions: Yes RLE Weight Bearing: Partial weight bearing      Mobility  Bed Mobility Overal bed mobility: Needs Assistance Bed Mobility: Supine to Sit     Supine to sit: Mod assist;HOB elevated     General bed mobility comments: verbal cues for precautions and technique, assist for R LE and scooting to EOB  Transfers Overall transfer level: Needs assistance Equipment used: Rolling walker (2 wheeled) Transfers: Sit to/from Stand Sit to Stand: Min assist;From elevated surface         General transfer comment: verbal cues for UE and LE positioning, maintaining precautions, assist to control descent  Ambulation/Gait Ambulation/Gait assistance: Min guard;+2 safety/equipment Ambulation Distance (Feet): 25 Feet Assistive device: Rolling walker (2 wheeled) Gait  Pattern/deviations: Step-to pattern;Decreased stance time - right     General Gait Details: verbal cues for sequence, RW positioning, PWB status, distance to tolerance, recliner following for safety  Stairs            Wheelchair Mobility    Modified Rankin (Stroke Patients Only)       Balance                                             Pertinent Vitals/Pain Pain Assessment: 0-10 Pain Score: 2  Pain Location: R hip Pain Descriptors / Indicators: Sore;Aching Pain Intervention(s): Limited activity within patient's tolerance;Repositioned;Monitored during session;Ice applied    Home Living Family/patient expects to be discharged to:: Skilled nursing facility Living Arrangements: Alone                    Prior Function Level of Independence: Independent with assistive device(s)         Comments: Pt ambulating household distances with rollator     Hand Dominance        Extremity/Trunk Assessment   Upper Extremity Assessment Upper Extremity Assessment: Generalized weakness    Lower Extremity Assessment Lower Extremity Assessment: RLE deficits/detail;Generalized weakness RLE Deficits / Details: maintained posterior hip precautions, requiring assist for R LE during bed mobility, educated on neutral pelvis positioning as pt tends to rest with hips slightly towards left side (attempting to off weight R hip however causing R hip IR); able to perform bil ankle pumps       Communication   Communication: [facility]  Cognition Arousal/Alertness: Awake/alert Behavior During Therapy: WFL for tasks assessed/performed Overall Cognitive Status: Within Functional Limits for tasks assessed                                        General Comments      Exercises     Assessment/Plan    PT Assessment Patient needs continued PT services  PT Problem List Decreased strength;Decreased mobility;Decreased activity tolerance;Decreased  knowledge of use of DME;Pain;Decreased knowledge of precautions       PT Treatment Interventions DME instruction;Gait training;Therapeutic exercise;Therapeutic activities;Patient/family education;Functional mobility training    PT Goals (Current goals can be found in the Care Plan section)  Acute Rehab PT Goals PT Goal Formulation: With patient Time For Goal Achievement: 07/21/17 Potential to Achieve Goals: Good    Frequency Min 6X/week   Barriers to discharge        Co-evaluation               AM-PAC PT "6 Clicks" Daily Activity  Outcome Measure Difficulty turning over in bed (including adjusting bedclothes, sheets and blankets)?: Unable Difficulty moving from lying on back to sitting on the side of the bed? : Unable Difficulty sitting down on and standing up from a chair with arms (e.g., wheelchair, bedside commode, etc,.)?: Unable Help needed moving to and from a bed to chair (including a wheelchair)?: A Lot Help needed walking in hospital room?: A Little Help needed climbing 3-5 steps with a railing? : Total 6 Click Score: 9    End of Session Equipment Utilized During Treatment: Gait belt;Right knee immobilizer Activity Tolerance: Patient tolerated treatment well Patient left: in chair;with chair alarm set;with call bell/phone within reach   PT Visit Diagnosis: Difficulty in walking, not elsewhere classified (R26.2)    Time: 8295-6213 PT Time Calculation (min) (ACUTE ONLY): 17 min   Charges:   PT Evaluation $PT Eval Low Complexity: 1 Low     PT G CodesCarmelia Bake, PT, DPT 07/07/2017 Pager: [phone]   York Ram E 07/07/2017, 11:29 AM

## 2017-07-07 NOTE — Anesthesia Postprocedure Evaluation (Signed)
Anesthesia Post Note  Patient: Wanda Brooks  Procedure(s) Performed: Revision right total hip acetabulum- posterior (Right Hip)     Patient location during evaluation: PACU Anesthesia Type: Spinal Level of consciousness: oriented and awake and alert Pain management: pain level controlled Vital Signs Assessment: post-procedure vital signs reviewed and stable Respiratory status: spontaneous breathing, respiratory function stable and patient connected to nasal cannula oxygen Cardiovascular status: blood pressure returned to baseline and stable Postop Assessment: no headache, no backache and no apparent nausea or vomiting Anesthetic complications: no    Last Vitals:  Vitals:   07/07/17 0056 07/07/17 0605  BP: (!) 114/45 (!) 138/48  Pulse: 60 61  Resp: 18 17  Temp: 36.7 C 36.7 C  SpO2: 98% 97%    Last Pain:  Vitals:   07/07/17 0605  TempSrc: Oral  PainSc:                  Syliva Mee S

## 2017-07-07 NOTE — NC FL2 (Signed)
[place] LEVEL OF CARE SCREENING TOOL     IDENTIFICATION  Patient Name: Wanda Brooks Birthdate: [birth year] Sex: female Admission Date (Current Location): 07/06/2017  [facility] and [place] Number:  Herbalist and Address:  [facility],  [place] [address] [place]      Provider Number: [id number]  Attending Physician Name and Address:  Paralee Cancel, MD  Relative Name and Phone Number:       Current Level of Care: Hospital Recommended Level of Care: [place] Prior Approval Number:    Date Approved/Denied:   PASRR Number:   [id number]   Discharge Plan: SNF    Current Diagnoses: Patient Active Problem List   Diagnosis Date Noted  . Overweight (BMI 25.0-29.9) 07/07/2017  . S/P hip replacement 07/06/2017  . History of right hip replacement 06/24/2017  . Thoracic aortic atherosclerosis ([place]) 02/13/2017  . Recurrent dislocation of right hip 12/30/2015  . CKD (chronic kidney disease), stage III ([place])   . Chronic atrial fibrillation ([place]) 12/16/2015  . Type 2 diabetes mellitus without complication, without long-term current use of insulin ([place]) 12/16/2015  . Essential hypertension 12/16/2015  . Pure hypercholesterolemia 12/16/2015  . Right hip pain 12/16/2015  . Gastroesophageal reflux disease 12/16/2015  . Coronary artery disease involving coronary bypass graft of native heart without angina pectoris 12/16/2015  . Chronic obstructive pulmonary disease ([place]) 12/16/2015  . Chronic congestive heart failure ([place]) 12/16/2015  . Slow transit constipation 12/16/2015  . Anxiety and depression 12/16/2015    Orientation RESPIRATION BLADDER Height & Weight     Self, Place  Normal Continent Weight: 158 lb (71.7 kg) Height:  5\' 5"  (165.1 cm)  BEHAVIORAL SYMPTOMS/MOOD NEUROLOGICAL BOWEL NUTRITION STATUS      Continent Diet(Regular )  AMBULATORY STATUS COMMUNICATION OF NEEDS Skin   Extensive Assist Verbally Normal                        Personal Care Assistance Level of Assistance  Bathing, Feeding, Dressing Bathing Assistance: Limited assistance Feeding assistance: Independent Dressing Assistance: Limited assistance     Functional Limitations Info  Sight, Hearing, Speech Sight Info: Adequate Hearing Info: Impaired Speech Info: Adequate    SPECIAL CARE FACTORS FREQUENCY  PT (By licensed PT), OT (By licensed OT)     PT Frequency: 5x/week OT Frequency: 5x/week            Contractures Contractures Info: Not present    Additional Factors Info  Code Status, Allergies Code Status Info: Fullcode Allergies Info: Allergies: Aspirin, Daypro Oxaprozin, Multaq Dronedarone           Current Medications (07/07/2017):  This is the current hospital active medication list Current Facility-Administered Medications  Medication Dose Route Frequency Provider Last Rate Last Dose  . 0.9 %  sodium chloride infusion   Intravenous Continuous Danae Orleans, PA-C 100 mL/hr at 07/06/17 1524    . acetaminophen (TYLENOL) tablet 325-650 mg  325-650 mg Oral Q6H PRN Danae Orleans, PA-C      . alum & mag hydroxide-simeth (MAALOX/MYLANTA) 200-200-20 MG/5ML suspension 15 mL  15 mL Oral Q4H PRN Babish, Matthew, PA-C      . amiodarone (PACERONE) tablet 200 mg  200 mg Oral Daily Danae Orleans, PA-C   200 mg at 07/07/17 1051  . amLODipine (NORVASC) tablet 10 mg  10 mg Oral Q breakfast Babish, Matthew, PA-C      . apixaban (ELIQUIS) tablet 2.5 mg  2.5  mg Oral Q12H Danae Orleans, PA-C   2.5 mg at 07/07/17 0857  . atorvastatin (LIPITOR) tablet 20 mg  20 mg Oral q1800 Danae Orleans, PA-C   20 mg at 07/06/17 1631  . bisacodyl (DULCOLAX) suppository 10 mg  10 mg Rectal Daily PRN Babish, Matthew, PA-C      . diphenhydrAMINE (BENADRYL) 12.5 MG/5ML elixir 12.5-25 mg  12.5-25 mg Oral Q4H PRN Babish, Matthew, PA-C      . docusate sodium (COLACE) capsule 100 mg  100 mg Oral BID Babish, Matthew, PA-C   100 mg at 07/07/17  1051  . famotidine (PEPCID) tablet 20 mg  20 mg Oral BID Danae Orleans, PA-C   20 mg at 07/07/17 1051  . ferrous sulfate tablet 325 mg  325 mg Oral TID PC Danae Orleans, PA-C   325 mg at 07/07/17 1307  . hydrALAZINE (APRESOLINE) tablet 10 mg  10 mg Oral Q8H Babish, Matthew, PA-C      . HYDROcodone-acetaminophen (NORCO) 7.5-325 MG per tablet 1-2 tablet  1-2 tablet Oral Q4H PRN Danae Orleans, PA-C      . HYDROcodone-acetaminophen (NORCO/VICODIN) 5-325 MG per tablet 1-2 tablet  1-2 tablet Oral Q4H PRN Danae Orleans, PA-C   1 tablet at 07/07/17 1050  . isosorbide mononitrate (IMDUR) 24 hr tablet 120 mg  120 mg Oral Q breakfast Babish, Matthew, PA-C      . lidocaine (XYLOCAINE) 0.5 % (with pres) injection 10 mL  10 mL Intradermal Once Danae Orleans, PA-C      . LORazepam (ATIVAN) tablet 0.5 mg  0.5 mg Oral Q12H PRN Babish, Matthew, PA-C      . magnesium citrate solution 1 Bottle  1 Bottle Oral Once PRN Babish, Matthew, PA-C      . menthol-cetylpyridinium (CEPACOL) lozenge 3 mg  1 lozenge Oral PRN Danae Orleans, PA-C       Or  . phenol (CHLORASEPTIC) mouth spray 1 spray  1 spray Mouth/Throat PRN Babish, Matthew, PA-C      . methocarbamol (ROBAXIN) tablet 500 mg  500 mg Oral Q6H PRN Danae Orleans, PA-C       Or  . methocarbamol (ROBAXIN) 500 mg in dextrose 5 % 50 mL IVPB  500 mg Intravenous Q6H PRN Danae Orleans, PA-C 110 mL/hr at 07/06/17 1336 500 mg at 07/06/17 1336  . methylPREDNISolone acetate (DEPO-MEDROL) injection 80 mg  80 mg Intra-articular Once Babish, Matthew, PA-C      . metoCLOPramide (REGLAN) tablet 5-10 mg  5-10 mg Oral Q8H PRN Danae Orleans, PA-C       Or  . metoCLOPramide (REGLAN) injection 5-10 mg  5-10 mg Intravenous Q8H PRN Danae Orleans, PA-C      . metoprolol succinate (TOPROL-XL) 24 hr tablet 100 mg  100 mg Oral BID Babish, Matthew, PA-C   100 mg at 07/07/17 0858  . morphine 2 MG/ML injection 0.5-1 mg  0.5-1 mg Intravenous Q2H PRN Danae Orleans, PA-C      .  ondansetron [facility]) tablet 4 mg  4 mg Oral Q6H PRN Danae Orleans, PA-C       Or  . ondansetron [facility]) injection 4 mg  4 mg Intravenous Q6H PRN Danae Orleans, PA-C      . oxybutynin (DITROPAN-XL) 24 hr tablet 5 mg  5 mg Oral QHS Babish, Matthew, PA-C   5 mg at 07/06/17 2051  . polyethylene glycol (MIRALAX / GLYCOLAX) packet 17 g  17 g Oral BID Danae Orleans, PA-C   17 g at 07/07/17 1050  .  traMADol (ULTRAM) tablet 50 mg  50 mg Oral Q6H BabishRodman Key, PA-C   50 mg at 07/07/17 1307     Discharge Medications: Please see discharge summary for a list of discharge medications.  Relevant Imaging Results:  Relevant Lab Results:   Additional Information HEB:[id number]  Lia Hopping, LCSW

## 2017-07-07 NOTE — Clinical Social Work Placement (Signed)
   CLINICAL SOCIAL WORK PLACEMENT  NOTE  Date:  07/07/2017  Patient Details  Name: Wanda Brooks MRN: [id number] Date of Birth: 1935-03-21  Clinical Social Work is seeking post-discharge placement for this patient at the [place] level of care (*CSW will initial, date and re-position this form in  chart as items are completed):  Yes   Patient/family provided with [place] Work Department's list of facilities offering this level of care within the geographic area requested by the patient (or if unable, by the patient's family).  Yes   Patient/family informed of their freedom to choose among providers that offer the needed level of care, that participate in Medicare, Medicaid or managed care program needed by the patient, have an available bed and are willing to accept the patient.  Yes   Patient/family informed of [place]'s ownership interest in [facility] and [facility], as well as of the fact that they are under no obligation to receive care at these facilities.  PASRR submitted to EDS on 07/07/17     PASRR number received on 07/07/17     Existing PASRR number confirmed on       FL2 transmitted to all facilities in geographic area requested by pt/family on       FL2 transmitted to all facilities within larger geographic area on 07/07/17     Patient informed that his/her managed care company has contracts with or will negotiate with certain facilities, including the following:  [facility] informed of bed offers received.  Patient chooses bed at [facility]     Physician recommends and patient chooses bed at      Patient to be transferred to [facility] on  .  Patient to be transferred to facility by       Patient family notified on   of transfer.  Name of family member notified:        PHYSICIAN       Additional Comment:    _______________________________________________ Lia Hopping, LCSW 07/07/2017, 4:20 PM

## 2017-07-07 NOTE — Clinical Social Work Note (Signed)
Clinical Social Work Assessment  Patient Details  Name: Wanda Brooks MRN: [id number] Date of Birth: 04/11/1935  Date of referral:  07/07/17               Reason for consult:  Facility Placement                Permission sought to share information with:  Family Supports Permission granted to share information::  Yes, Verbal Permission Granted  Name::        Agency::  SNF  Relationship::  Daughter   Contact Information:     Housing/Transportation Living arrangements for the past 2 months:  Single Family Home Source of Information:  Patient Patient Interpreter Needed:  None Criminal Activity/Legal Involvement Pertinent to Current Situation/Hospitalization:  No - Comment as needed Significant Relationships:  Adult Children Lives with:  Adult Children Do you feel safe going back to the place where you live?  No Need for family participation in patient care:  Yes (Dependent with mobility)  Care giving concerns:   Patient admitted for right revision of total hip.   Social Worker assessment / plan:  CSW met with patient at bedside, patient alert x4. Patient reports she live in the home with her daughter. Patient reports she admitted to hospital because she "could not pick up my foot without having pain. Patient reports she went to outpatient therapy with her daughter but the pain continued. Patient gave CSW permission to talk with her daughter about discharge planning.   CSW discussed patient discharge plan to SNF with the patient daughter via phone. CSW inquired about the patient level of functioning at home. Patient daughter reports the patient uses a walker,and uses modified shower quipment in home for bathing. She reports the patient has been able to go home and complete outpatient therapy in the past but this time is slowly progressing this time. Patient daughter prefers a facility close to her home in [place]. CSW informed the patient daughter about the SNF process and later follow  up with bed offers. CSW explain medicare requirements for placement.  Patient daughter reports understanding  CSW sent patient clinical information via the HUB.  fl2 completed.  Patient and her daughter agreeable to [facility].   Plan: SNF  Employment status:  Retired Forensic scientist:  Medicare PT Recommendations:  [place] / Referral to community resources:  [place]  Patient/Family's Response to care:  Agreeable and Responding well to care.   Patient/Family's Understanding of and Emotional Response to Diagnosis, Current Treatment, and Prognosis: Patient has a basic understanding of diagnosis. Patient daughter is a Marine scientist and familiar with patient diagnosis and medical history. She reports, "She will never be able to walk at one hundred percent again, I just want her to be strong enough to use her walker and get from her room to bathroom."  Emotional Assessment Appearance:  Appears stated age Attitude/Demeanor/Rapport:    Affect (typically observed):  Accepting, Pleasant Orientation:  Oriented to Self, Oriented to Place, Oriented to  Time, Oriented to Situation Alcohol / Substance use:  Not Applicable Psych involvement (Current and /or in the community):  No (Comment)  Discharge Needs  Concerns to be addressed:  Discharge Planning Concerns Readmission within the last 30 days:  No Current discharge risk:  Dependent with Mobility Barriers to Discharge:  Continued Medical Work up   [facility], [place] 07/07/2017, 4:13 PM

## 2017-07-07 NOTE — Progress Notes (Signed)
     Subjective: 1 Day Post-Op Procedure(s) (LRB): Revision right total hip acetabulum- posterior (Right)   Patient reports pain as mild, in the right hip.  States that she is having more pain in the left knee.  Discussed cortisone injection.  Planning on SNF upon discharge.  Patient was having difficulty getting around on the left leg prior to surgery and is not 50% WB on the right leg post-op.   Objective:   VITALS:   Vitals:   07/07/17 0056 07/07/17 0605  BP: (!) 114/45 (!) 138/48  Pulse: 60 61  Resp: 18 17  Temp: 98.1 F (36.7 C) 98.1 F (36.7 C)  SpO2: 98% 97%    Dorsiflexion/Plantar flexion intact Incision: dressing C/D/I No cellulitis present Compartment soft  LABS Recent Labs    07/07/17 0547  HGB 9.9*  HCT 30.0*  WBC 11.8*  PLT 220    Recent Labs    07/07/17 0547  NA 137  K 4.2  BUN 30*  CREATININE 1.53*  GLUCOSE 134*     Assessment/Plan: 1 Day Post-Op Procedure(s) (LRB): Revision right total hip acetabulum- posterior (Right) Foley cath d/c'ed Advance diet Up with therapy D/C IV fluids Discharge to SNF eventually, when ready  Overweight (BMI 25-29.9) Estimated body mass index is 26.29 kg/m as calculated from the following:   Height as of this encounter: 5\' 5"  (1.651 m).   Weight as of this encounter: 71.7 kg (158 lb). Patient also counseled that weight may inhibit the healing process Patient counseled that losing weight will help with future health issues      [place]. Margia Wiesen   PAC  07/07/2017, 8:34 AM

## 2017-07-08 ENCOUNTER — Inpatient Hospital Stay (HOSPITAL_COMMUNITY): Payer: Medicare Other

## 2017-07-08 LAB — CBC
HCT: 31.7 % — ABNORMAL LOW (ref 36.0–46.0)
Hemoglobin: 10.4 g/dL — ABNORMAL LOW (ref 12.0–15.0)
MCH: 30.6 pg (ref 26.0–34.0)
MCHC: 32.8 g/dL (ref 30.0–36.0)
MCV: 93.2 fL (ref 78.0–100.0)
PLATELETS: 251 10*3/uL (ref 150–400)
RBC: 3.4 MIL/uL — AB (ref 3.87–5.11)
RDW: 14 % (ref 11.5–15.5)
WBC: 13.8 10*3/uL — AB (ref 4.0–10.5)

## 2017-07-08 LAB — BASIC METABOLIC PANEL
ANION GAP: 8 (ref 5–15)
BUN: 37 mg/dL — AB (ref 6–20)
CO2: 20 mmol/L — ABNORMAL LOW (ref 22–32)
Calcium: 8.1 mg/dL — ABNORMAL LOW (ref 8.9–10.3)
Chloride: 112 mmol/L — ABNORMAL HIGH (ref 101–111)
Creatinine, Ser: 1.69 mg/dL — ABNORMAL HIGH (ref 0.44–1.00)
GFR calc Af Amer: 32 mL/min — ABNORMAL LOW (ref >60)
GFR calc non Af Amer: 28 mL/min — ABNORMAL LOW (ref >60)
Glucose, Bld: 145 mg/dL — ABNORMAL HIGH (ref 65–99)
POTASSIUM: 4.4 mmol/L (ref 3.5–5.1)
SODIUM: 140 mmol/L (ref 135–145)

## 2017-07-08 MED ORDER — GUAIFENESIN ER 600 MG PO TB12
600.0000 mg | ORAL_TABLET | Freq: Two times a day (BID) | ORAL | Status: DC
  Administered 2017-07-08 – 2017-07-12 (×9): 600 mg via ORAL
  Filled 2017-07-08 (×9): qty 1

## 2017-07-08 MED ORDER — FAMOTIDINE 20 MG PO TABS
20.0000 mg | ORAL_TABLET | Freq: Every day | ORAL | Status: DC
  Administered 2017-07-09 – 2017-07-12 (×4): 20 mg via ORAL
  Filled 2017-07-08 (×4): qty 1

## 2017-07-08 NOTE — Procedures (Signed)
Patient wishes to proceed with a left knee cortisone injection.  Risks, benefits and expectations were discussed with the patient.   Patient understand the risks, benefits and expectations and wishes to proceed with surgery. The superolateral portal was cleaned 3 times with betadine. The knee was then injected with 80 mg (2 cc) of DepoMedrol and 4 cc of lidocaine.   Patient tolerated the procedure well without complications or issues.  Instructed the patient to use ice to decrease any inflammation in the left knee.

## 2017-07-08 NOTE — Consult Note (Signed)
   [facility] CM Inpatient Consult   07/08/2017  Wanda Brooks 01-19-1936 [id number]   Patient screened for potential [facility] Care Management services due to multiple hospitalizations and high risk of unplanned readmission score of 29%.  Chart review reveals patient is s/p revision right total hip acetabulum-posterior. Appears discharge plan is for SNF.   No identifiable [facility] Care Management needs at this time.    Marthenia Rolling, MSN-Ed, RN,BSN [facility] Liaison [phone]

## 2017-07-08 NOTE — Progress Notes (Signed)
Physical Therapy Treatment Patient Details Name: Wanda Brooks MRN: [id number] DOB: 28-Mar-1935 Today's Date: 07/08/2017    History of Present Illness Pt is a 82 year old female s/p revision right total hip acetabulum- posterior with PMHx signficant for but not limited to CAD, COPD, CHF, CABG, MI, DM, pacemaker, multiple R hip surgeries    PT Comments    Pt ambulated short distance in hallway and performed exercises in supine.  Reviewed posterior hip precautions during mobility and exercises.  Continue to recommend SNF.   Follow Up Recommendations  SNF     Equipment Recommendations  Rolling walker with 5" wheels    Recommendations for Other Services       Precautions / Restrictions Precautions Precautions: Posterior Hip;Fall Precaution Comments: reviewed posterior hip precautions and provided handout Required Braces or Orthoses: Knee Immobilizer - Right Restrictions Weight Bearing Restrictions: Yes RLE Weight Bearing: Partial weight bearing    Mobility  Bed Mobility Overal bed mobility: Needs Assistance Bed Mobility: Supine to Sit;Sit to Supine     Supine to sit: HOB elevated;Min assist Sit to supine: Min assist   General bed mobility comments: verbal cues for precautions and technique, assist for R LE  Transfers Overall transfer level: Needs assistance Equipment used: Rolling walker (2 wheeled) Transfers: Sit to/from Stand Sit to Stand: Min assist;From elevated surface         General transfer comment: verbal cues for UE and LE positioning, maintaining precautions, assist to control descent  Ambulation/Gait Ambulation/Gait assistance: Min guard Ambulation Distance (Feet): 40 Feet Assistive device: Rolling walker (2 wheeled) Gait Pattern/deviations: Step-to pattern;Decreased stance time - right     General Gait Details: verbal cues for sequence, RW positioning, PWB status, distance to tolerance   Stairs             Wheelchair Mobility     Modified Rankin (Stroke Patients Only)       Balance                                            Cognition Arousal/Alertness: Awake/alert Behavior During Therapy: WFL for tasks assessed/performed Overall Cognitive Status: Within Functional Limits for tasks assessed                                        Exercises Total Joint Exercises Ankle Circles/Pumps: AROM;Both;10 reps Quad Sets: AROM;10 reps;Both Short Arc Quad: AROM;10 reps;Right Heel Slides: AAROM;10 reps;Right(within precautions) Hip ABduction/ADduction: 10 reps;Right;AAROM    General Comments        Pertinent Vitals/Pain Pain Assessment: 0-10 Pain Score: 3  Pain Location: R hip Pain Descriptors / Indicators: Sore;Aching Pain Intervention(s): Limited activity within patient's tolerance;Repositioned;Monitored during session;Ice applied    Home Living                      Prior Function            PT Goals (current goals can now be found in the care plan section) Progress towards PT goals: Progressing toward goals    Frequency    Min 6X/week      PT Plan Current plan remains appropriate    Co-evaluation              AM-PAC PT "6 Clicks" Daily Activity  Outcome Measure  Difficulty turning over in bed (including adjusting bedclothes, sheets and blankets)?: Unable Difficulty moving from lying on back to sitting on the side of the bed? : Unable Difficulty sitting down on and standing up from a chair with arms (e.g., wheelchair, bedside commode, etc,.)?: Unable Help needed moving to and from a bed to chair (including a wheelchair)?: A Little Help needed walking in hospital room?: A Little Help needed climbing 3-5 steps with a railing? : A Lot 6 Click Score: 11    End of Session Equipment Utilized During Treatment: Gait belt;Right knee immobilizer Activity Tolerance: Patient tolerated treatment well Patient left: in bed;with bed alarm set;with  call bell/phone within reach;with family/visitor present   PT Visit Diagnosis: Difficulty in walking, not elsewhere classified (R26.2)     Time: 3570-1779 PT Time Calculation (min) (ACUTE ONLY): 24 min  Charges:  $Gait Training: 8-22 mins $Therapeutic Exercise: 8-22 mins                    G Codes:       Carmelia Bake, PT, DPT 07/08/2017 Pager: [phone]  York Ram E 07/08/2017, 1:24 PM

## 2017-07-08 NOTE — Progress Notes (Signed)
     Subjective: 2 Days Post-Op Procedure(s) (LRB): Revision right total hip acetabulum- posterior (Right)   Patient reports pain as mild, pain controlled. No events throughout the night.  State that she has continued pain in the left knee and had a previous discussion with Dr. Alvan Dame with regards to receiving a cortisone injection in the left knee.  Still planning on d/c to SNF, which would be appropriate due to her restrictions.   Objective:   VITALS:   Vitals:   07/08/17 0746 07/08/17 1335  BP: (!) 156/68 (!) 151/60  Pulse: 61 (!) 59  Resp: 20   Temp: 98.2 F (36.8 C) (!) 97.3 F (36.3 C)  SpO2: 96% 95%    Dorsiflexion/Plantar flexion intact Incision: dressing C/D/I No cellulitis present Compartment soft  LABS Recent Labs    07/07/17 0547 07/08/17 0516  HGB 9.9* 10.4*  HCT 30.0* 31.7*  WBC 11.8* 13.8*  PLT 220 251    Recent Labs    07/07/17 0547 07/08/17 0516  NA 137 140  K 4.2 4.4  BUN 30* 37*  CREATININE 1.53* 1.69*  GLUCOSE 134* 145*     Assessment/Plan: 2 Days Post-Op Procedure(s) (LRB): Revision right total hip acetabulum- posterior (Right)  After receiving consent a cortisone injection was preformed without issues.  Procedure note to follow. Up with therapy Discharge to SNF planned, possibly tomorrow   [place] Pugh. Lawrie Tunks   PAC  07/08/2017, 2:48 PM

## 2017-07-09 ENCOUNTER — Encounter
Admission: RE | Admit: 2017-07-09 | Discharge: 2017-07-09 | Disposition: A | Discharge status: Home / Self Care | Payer: Medicare Other | Source: Ambulatory Visit | Attending: Internal Medicine | Admitting: Internal Medicine

## 2017-07-09 ENCOUNTER — Encounter (HOSPITAL_COMMUNITY): Payer: Self-pay | Admitting: Physician Assistant

## 2017-07-09 ENCOUNTER — Inpatient Hospital Stay (HOSPITAL_COMMUNITY): Payer: Medicare Other

## 2017-07-09 DIAGNOSIS — R001 Bradycardia, unspecified: Secondary | ICD-10-CM

## 2017-07-09 DIAGNOSIS — R0602 Shortness of breath: Secondary | ICD-10-CM

## 2017-07-09 DIAGNOSIS — I2510 Atherosclerotic heart disease of native coronary artery without angina pectoris: Secondary | ICD-10-CM

## 2017-07-09 DIAGNOSIS — I2583 Coronary atherosclerosis due to lipid rich plaque: Secondary | ICD-10-CM

## 2017-07-09 DIAGNOSIS — I10 Essential (primary) hypertension: Secondary | ICD-10-CM

## 2017-07-09 LAB — BASIC METABOLIC PANEL
Anion gap: 9 (ref 5–15)
BUN: 47 mg/dL — AB (ref 6–20)
CALCIUM: 8.3 mg/dL — AB (ref 8.9–10.3)
CO2: 19 mmol/L — AB (ref 22–32)
CREATININE: 1.84 mg/dL — AB (ref 0.44–1.00)
Chloride: 109 mmol/L (ref 101–111)
GFR, EST AFRICAN AMERICAN: 29 mL/min — AB (ref >60)
GFR, EST NON AFRICAN AMERICAN: 25 mL/min — AB (ref >60)
Glucose, Bld: 168 mg/dL — ABNORMAL HIGH (ref 65–99)
Potassium: 4.7 mmol/L (ref 3.5–5.1)
SODIUM: 137 mmol/L (ref 135–145)

## 2017-07-09 LAB — TROPONIN I
TROPONIN I: 0.11 ng/mL — AB (ref <0.03)
TROPONIN I: 0.12 ng/mL — AB (ref <0.03)

## 2017-07-09 LAB — BRAIN NATRIURETIC PEPTIDE: B Natriuretic Peptide: 1478.8 pg/mL — ABNORMAL HIGH (ref 0.0–100.0)

## 2017-07-09 MED ORDER — TECHNETIUM TO 99M ALBUMIN AGGREGATED
3.9000 | Freq: Once | INTRAVENOUS | Status: AC | PRN
  Administered 2017-07-09: 3.9 via INTRAVENOUS

## 2017-07-09 MED ORDER — TECHNETIUM TC 99M DIETHYLENETRIAME-PENTAACETIC ACID
28.0000 | Freq: Once | INTRAVENOUS | Status: AC | PRN
  Administered 2017-07-09: 28 via RESPIRATORY_TRACT

## 2017-07-09 NOTE — Progress Notes (Signed)
Patient discharge to SNF canceled for today. CSW updated Sharyn Lull at the facility.  The [facility] will accept patient in the am, if she is medically ready.  Weekend CSW will follow up.   Kathrin Greathouse, Latanya Presser, MSW Clinical Social Worker  [phone] 07/09/2017  2:18 PM

## 2017-07-09 NOTE — Progress Notes (Signed)
     Subjective: 3 Days Post-Op Procedure(s) (LRB): Revision right total hip acetabulum- posterior (Right)   Patient reports pain as mild, pain controlled in the right hip.  Left knee is feeling much better after the cortisone injection yesterday.  No reported events throughout the night.  Ready to be discharged to skilled nursing facility.  Objective:   VITALS:   Vitals:   07/08/17 2128 07/09/17 0551  BP: (!) 180/68 (!) 189/49  Pulse: 61 (!) 41  Resp:  (!) 21  Temp: 97.8 F (36.6 C) (!) 96.5 F (35.8 C)  SpO2: 96% 95%    Dorsiflexion/Plantar flexion intact Incision: dressing C/D/I No cellulitis present Compartment soft  LABS Recent Labs    07/07/17 0547 07/08/17 0516  HGB 9.9* 10.4*  HCT 30.0* 31.7*  WBC 11.8* 13.8*  PLT 220 251    Recent Labs    07/07/17 0547 07/08/17 0516  NA 137 140  K 4.2 4.4  BUN 30* 37*  CREATININE 1.53* 1.69*  GLUCOSE 134* 145*     Assessment/Plan: 3 Days Post-Op Procedure(s) (LRB): Revision right total hip acetabulum- posterior (Right)   Up with therapy Discharge to SNF  Follow up in 2 weeks at [facility]. Follow up with OLIN,Karsten Howry D in 2 weeks.  Contact information:  [facility] [address] [place] Jackolyn Geron   PAC  07/09/2017, 8:03 AM
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
## 2017-07-10 ENCOUNTER — Inpatient Hospital Stay (HOSPITAL_COMMUNITY): Payer: Medicare Other

## 2017-07-10 DIAGNOSIS — I351 Nonrheumatic aortic (valve) insufficiency: Secondary | ICD-10-CM

## 2017-07-10 DIAGNOSIS — I25119 Atherosclerotic heart disease of native coronary artery with unspecified angina pectoris: Secondary | ICD-10-CM

## 2017-07-10 DIAGNOSIS — N183 Chronic kidney disease, stage 3 (moderate): Secondary | ICD-10-CM

## 2017-07-10 DIAGNOSIS — I481 Persistent atrial fibrillation: Secondary | ICD-10-CM

## 2017-07-10 LAB — TROPONIN I: TROPONIN I: 0.09 ng/mL — AB (ref <0.03)

## 2017-07-10 LAB — ECHOCARDIOGRAM COMPLETE
HEIGHTINCHES: 65 in
Weight: 2828.94 oz

## 2017-07-10 MED ORDER — ALBUTEROL SULFATE (2.5 MG/3ML) 0.083% IN NEBU
2.5000 mg | INHALATION_SOLUTION | Freq: Four times a day (QID) | RESPIRATORY_TRACT | Status: DC | PRN
  Administered 2017-07-10: 2.5 mg via RESPIRATORY_TRACT
  Filled 2017-07-10: qty 3
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
## 2017-07-10 NOTE — Progress Notes (Signed)
  Echocardiogram 2D Echocardiogram has been performed.  Wanda Brooks 07/10/2017, 9:50 AM

## 2017-07-10 NOTE — Progress Notes (Signed)
Progress Note  Patient Name: Wanda Brooks Date of Encounter: 07/10/2017  Primary Cardiologist: Dr. Kirk Ruths  Subjective   Breathing improved today.  No further dizziness, no chest pain.  Somewhat congested with intermittent cough.  Inpatient Medications    Scheduled Meds: . amiodarone  200 mg Oral Daily  . amLODipine  10 mg Oral Q breakfast  . apixaban  2.5 mg Oral Q12H  . atorvastatin  20 mg Oral q1800  . docusate sodium  100 mg Oral BID  . famotidine  20 mg Oral Daily  . ferrous sulfate  325 mg Oral TID PC  . guaiFENesin  600 mg Oral BID  . hydrALAZINE  10 mg Oral Q8H  . isosorbide mononitrate  120 mg Oral Q breakfast  . lidocaine  10 mL Intradermal Once  . methylPREDNISolone acetate  80 mg Intra-articular Once  . metoprolol succinate  100 mg Oral BID  . oxybutynin  5 mg Oral QHS  . polyethylene glycol  17 g Oral BID  . traMADol  50 mg Oral Q6H   Continuous Infusions: . sodium chloride Stopped (07/08/17 0745)  . methocarbamol (ROBAXIN)  IV 500 mg (07/06/17 1336)   PRN Meds: acetaminophen, alum & mag hydroxide-simeth, bisacodyl, diphenhydrAMINE, HYDROcodone-acetaminophen, HYDROcodone-acetaminophen, LORazepam, magnesium citrate, menthol-cetylpyridinium **OR** phenol, methocarbamol **OR** methocarbamol (ROBAXIN)  IV, metoCLOPramide **OR** metoCLOPramide (REGLAN) injection, morphine injection, ondansetron **OR** ondansetron (ZOFRAN) IV   Vital Signs    Vitals:   07/09/17 1632 07/09/17 1744 07/09/17 2051 07/10/17 0518  BP: (!) 136/92 (!) 167/66 (!) 153/66 (!) 164/64  Pulse: 60 70 (!) 59 63  Resp: 18 20 20 18   Temp:  98.7 F (37.1 C) 98.8 F (37.1 C) (!) 97.5 F (36.4 C)  TempSrc:  Oral Oral Oral  SpO2: 90% 91% 95% 94%  Weight:      Height:        Intake/Output Summary (Last 24 hours) at 07/10/2017 0833 Last data filed at 07/09/2017 2300 Gross per 24 hour  Intake 600 ml  Output -  Net 600 ml   Filed Weights   07/06/17 1017 07/06/17 1445 07/09/17 1100   Weight: 158 lb (71.7 kg) 158 lb (71.7 kg) 176 lb 12.9 oz (80.2 kg)    Telemetry    Atrial flutter with ventricular pacing. Personally reviewed.  ECG    Tracing from 07/08/2017 showed a ventricular paced rhythm with possible atrial tracking.  Personally reviewed.  Physical Exam   GEN:  Elderly woman.  No acute distress.   Neck: No JVD. Cardiac: RRR, soft systolic murmur, no gallop.  Respiratory: Nonlabored.  Congestion and rhonchi mainly left-sided. GI: Soft, nontender, bowel sounds present. MS: No edema; No deformity. Neuro:  Nonfocal. Psych: Alert and oriented x 3. Normal affect.  Labs    Chemistry Recent Labs  Lab 07/07/17 0547 07/08/17 0516 07/09/17 1207  NA 137 140 137  K 4.2 4.4 4.7  CL 109 112* 109  CO2 19* 20* 19*  GLUCOSE 134* 145* 168*  BUN 30* 37* 47*  CREATININE 1.53* 1.69* 1.84*  CALCIUM 7.8* 8.1* 8.3*  GFRNONAA 31* 28* 25*  GFRAA 36* 32* 29*  ANIONGAP 9 8 9      Hematology Recent Labs  Lab 07/07/17 0547 07/08/17 0516  WBC 11.8* 13.8*  RBC 3.23* 3.40*  HGB 9.9* 10.4*  HCT 30.0* 31.7*  MCV 92.9 93.2  MCH 30.7 30.6  MCHC 33.0 32.8  RDW 13.5 14.0  PLT 220 251    Cardiac Enzymes Recent Labs  Lab 07/09/17 1207 07/09/17 1856 07/10/17 0124  TROPONINI 0.11* 0.12* 0.09*   No results for input(s): TROPIPOC in the last 168 hours.   BNP Recent Labs  Lab 07/09/17 1206  BNP 1,478.8*     Radiology    Nm Pulmonary Perf And Vent  Result Date: 07/09/2017 CLINICAL DATA:  Shortness of breath EXAM: NUCLEAR MEDICINE VENTILATION - PERFUSION LUNG SCAN TECHNIQUE: Ventilation images were obtained in multiple projections using inhaled aerosol Tc-19m DTPA. Perfusion images were obtained in multiple projections after intravenous injection of Tc-77m-MAA. RADIOPHARMACEUTICALS:  28 mCi of Tc-18m DTPA aerosol inhalation and 3.9 mCi Tc84m-MAA IV COMPARISON:  None Correlation: Chest radiograph 07/09/2017 FINDINGS: Ventilation: Central airway deposition of  aerosol. Markedly impaired ventilation in LEFT lower lobe, likely related to cardiomegaly. Blunting of ventilation at the in the lower lobes at the costophrenic angles question pleural effusions. Perfusion: Unable to obtain all 8 images due to patient being short of breath and orthopneic; unable to obtain RPO and LAO views. Diminished perfusion in LEFT lower lobe matching ventilation, suspect related to cardiomegaly present on chest radiograph. Probable small layered pleural effusions posteriorly. No other segmental or subsegmental perfusion defects identified. Chest radiograph: Enlargement of cardiac silhouette with pulmonary vascular congestion and mild pulmonary edema. Atelectasis versus consolidation LEFT lower lobe and suspected small pleural effusion. IMPRESSION: Very low probability for pulmonary embolism. Electronically Signed   By: Lavonia Dana M.D.   On: 07/09/2017 16:52   Dg Chest Port 1 View  Result Date: 07/09/2017 CLINICAL DATA:  Shortness of breath.  Weakness. EXAM: PORTABLE CHEST 1 VIEW COMPARISON:  07/08/2017. FINDINGS: Stable post CABG changes, enlarged cardiac silhouette and left subclavian pacemaker leads. The pulmonary vasculature remains prominent. Mild decrease in prominence of the interstitial markings. There is also mildly increased density at the left lateral lung base. Diffuse osteopenia. IMPRESSION: 1. Interval patchy atelectasis or pneumonia at the left lateral lung base with possible left pleural fluid. 2. Stable cardiomegaly and pulmonary vascular congestion with mildly improved interstitial pulmonary edema. Electronically Signed   By: Claudie Revering M.D.   On: 07/09/2017 14:17   Dg Chest Port 1 View  Result Date: 07/08/2017 CLINICAL DATA:  Status post right hip replacement 2 days ago with irregular heartbeat EXAM: PORTABLE CHEST 1 VIEW COMPARISON:  06/24/2017 FINDINGS: Cardiac shadow is stable. Postsurgical changes are noted. Pacing device is again seen. The lungs are well aerated  bilaterally. No focal confluent infiltrate is noted. Mild vascular congestion is noted although improved from the prior study. IMPRESSION: Improved vascular congestion when compare with the prior exam. No acute infiltrate is noted. Electronically Signed   By: Inez Catalina M.D.   On: 07/08/2017 11:45    Cardiac Studies   Echocardiogram pending.  Patient Profile     82 y.o. female with a history of atrial fibrillation/flutter status post Medtronic pacemaker, CAD status post CABG in 4196, chronic diastolic heart failure, carotid artery disease status post bilateral CEA, COPD, CKD stage III, type 2 diabetes mellitus, hypertension, hyperlipidemia, now status post right hip surgery.  Cardiac evaluation requested due to dizziness, shortness of breath and reported bradycardia..  Assessment & Plan    1.  Shortness of breath and weakness.  This has improved today.  Pacemaker was interrogated with adjustments made yesterday and she is now ventricular paced at 60 bpm, no bradycardia noted on telemetry monitoring.  Ventilation/perfusion lung scan was very low probability for pulmonary embolus.  Blood pressure has been stable, mildly hypertensive.  Echocardiogram is pending.  2.  Multivessel CAD status post CABG in 2006.  No obvious angina symptoms.  Echocardiogram pending for reassessment of LVEF.  3.  Mixed hyperlipidemia on statin therapy.  4.  Essential hypertension.  5.  Paroxysmal to persistent atrial fibrillation/flutter.  She is on amiodarone and Eliquis has been resumed.  6. CKD, stage 3.  Creatinine 1.8.  7.  Postop day #5 status post revision right total hip acetabulum-posterior.  Discussed with patient and family in room.  Follow-up echocardiogram today.  She feels better after pacemaker adjustments, would follow telemetry.  Chest x-ray does show possible left-sided infiltrate versus effusion.  She is afebrile.  Needs follow-up CBC and BMET.  Would continue to watch in hospital for  now.  Signed, Rozann Lesches, MD  07/10/2017, 8:33 AM

## 2017-07-10 NOTE — Progress Notes (Signed)
Subjective: 4 Days Post-Op Procedure(s) (LRB): Revision right total hip acetabulum- posterior (Right) Patient reports pain as mild.   Reports feeling better this morning. No CP or SOB. Minimal hip pain.   Objective: Vital signs in last 24 hours: Temp:  [97.5 F (36.4 C)-98.8 F (37.1 C)] 97.5 F (36.4 C) (05/11 0518) Pulse Rate:  [37-70] 63 (05/11 0518) Resp:  [18-20] 18 (05/11 0518) BP: (136-167)/(53-92) 164/64 (05/11 0518) SpO2:  [88 %-98 %] 94 % (05/11 0518) Weight:  [80.2 kg (176 lb 12.9 oz)] 80.2 kg (176 lb 12.9 oz) (05/10 1100)  Intake/Output from previous day: 05/10 0701 - 05/11 0700 In: 600 [P.O.:600] Out: -  Intake/Output this shift: Total I/O In: 240 [P.O.:240] Out: -   Recent Labs    07/08/17 0516  HGB 10.4*   Recent Labs    07/08/17 0516  WBC 13.8*  RBC 3.40*  HCT 31.7*  PLT 251   Recent Labs    07/08/17 0516 07/09/17 1207  NA 140 137  K 4.4 4.7  CL 112* 109  CO2 20* 19*  BUN 37* 47*  CREATININE 1.69* 1.84*  GLUCOSE 145* 168*  CALCIUM 8.1* 8.3*   No results for input(s): LABPT, INR in the last 72 hours.  Neurologically intact ABD soft Neurovascular intact Sensation intact distally Intact pulses distally Dorsiflexion/Plantar flexion intact Incision: dressing C/D/I and no drainage No cellulitis present Compartment soft no calf pain or sign of DVT  Anticipated LOS equal to or greater than 2 midnights due to - Age 42 and older with one or more of the following:  - Obesity  - Expected need for hospital services (PT, OT, Nursing) required for safe  discharge  - Anticipated need for postoperative skilled nursing care or inpatient rehab  - Active co-morbidities: Coronary Artery Disease and Cardiac Arrhythmia OR   - Unanticipated findings during/Post Surgery: Lab abnormalities and Slow post-op progression: GI, pain control, mobility     Assessment/Plan: 4 Days Post-Op Procedure(s) (LRB): Revision right total hip acetabulum- posterior  (Right) Advance diet Up with therapy Echo pending Continue Eliquis Appreciate cardiology input Pt seems to be doing much better following pacer interrogation yesterday Stable from ortho standpoint when medically stable for D/C, will require SNF Discussed with Dr. Mliss Fritz, Conley Rolls. 07/10/2017, 10:06 AM

## 2017-07-11 DIAGNOSIS — I429 Cardiomyopathy, unspecified: Secondary | ICD-10-CM

## 2017-07-11 LAB — BASIC METABOLIC PANEL
Anion gap: 8 (ref 5–15)
BUN: 36 mg/dL — AB (ref 6–20)
CALCIUM: 8.4 mg/dL — AB (ref 8.9–10.3)
CO2: 21 mmol/L — ABNORMAL LOW (ref 22–32)
CREATININE: 1.45 mg/dL — AB (ref 0.44–1.00)
Chloride: 109 mmol/L (ref 101–111)
GFR, EST AFRICAN AMERICAN: 39 mL/min — AB (ref >60)
GFR, EST NON AFRICAN AMERICAN: 33 mL/min — AB (ref >60)
Glucose, Bld: 111 mg/dL — ABNORMAL HIGH (ref 65–99)
Potassium: 4 mmol/L (ref 3.5–5.1)
SODIUM: 138 mmol/L (ref 135–145)

## 2017-07-11 LAB — CBC
HCT: 29.2 % — ABNORMAL LOW (ref 36.0–46.0)
Hemoglobin: 9.5 g/dL — ABNORMAL LOW (ref 12.0–15.0)
MCH: 30.8 pg (ref 26.0–34.0)
MCHC: 32.5 g/dL (ref 30.0–36.0)
MCV: 94.8 fL (ref 78.0–100.0)
PLATELETS: 212 10*3/uL (ref 150–400)
RBC: 3.08 MIL/uL — ABNORMAL LOW (ref 3.87–5.11)
RDW: 14.4 % (ref 11.5–15.5)
WBC: 12.1 10*3/uL — ABNORMAL HIGH (ref 4.0–10.5)

## 2017-07-11 MED ORDER — FUROSEMIDE 10 MG/ML IJ SOLN
40.0000 mg | Freq: Once | INTRAMUSCULAR | Status: AC
  Administered 2017-07-11: 40 mg via INTRAVENOUS
  Filled 2017-07-11: qty 4

## 2017-07-11 NOTE — Progress Notes (Signed)
Progress Note  Patient Name: Wanda Brooks Date of Encounter: 07/11/2017  Primary Cardiologist: Dr. Kirk Ruths  Subjective   No dizziness, breathing generally improved.  Still somewhat congested.  No chest pain or palpitations.  Inpatient Medications    Scheduled Meds: . amiodarone  200 mg Oral Daily  . amLODipine  10 mg Oral Q breakfast  . apixaban  2.5 mg Oral Q12H  . atorvastatin  20 mg Oral q1800  . docusate sodium  100 mg Oral BID  . famotidine  20 mg Oral Daily  . ferrous sulfate  325 mg Oral TID PC  . guaiFENesin  600 mg Oral BID  . hydrALAZINE  10 mg Oral Q8H  . isosorbide mononitrate  120 mg Oral Q breakfast  . lidocaine  10 mL Intradermal Once  . methylPREDNISolone acetate  80 mg Intra-articular Once  . metoprolol succinate  100 mg Oral BID  . oxybutynin  5 mg Oral QHS  . polyethylene glycol  17 g Oral BID  . traMADol  50 mg Oral Q6H   Continuous Infusions: . sodium chloride Stopped (07/08/17 0745)  . methocarbamol (ROBAXIN)  IV 500 mg (07/06/17 1336)   PRN Meds: acetaminophen, albuterol, alum & mag hydroxide-simeth, bisacodyl, diphenhydrAMINE, HYDROcodone-acetaminophen, HYDROcodone-acetaminophen, LORazepam, magnesium citrate, menthol-cetylpyridinium **OR** phenol, methocarbamol **OR** methocarbamol (ROBAXIN)  IV, metoCLOPramide **OR** metoCLOPramide (REGLAN) injection, morphine injection, ondansetron **OR** ondansetron (ZOFRAN) IV   Vital Signs    Vitals:   07/10/17 1400 07/10/17 2020 07/11/17 0420 07/11/17 0611  BP:  (!) 153/58 140/60   Pulse:  61 66 64  Resp:  18 16   Temp:  98.1 F (36.7 C) 98.1 F (36.7 C)   TempSrc:  Oral Oral   SpO2: 90% 96% 90% 95%  Weight:      Height:        Intake/Output Summary (Last 24 hours) at 07/11/2017 0737 Last data filed at 07/11/2017 0500 Gross per 24 hour  Intake 780 ml  Output -  Net 780 ml   Filed Weights   07/06/17 1017 07/06/17 1445 07/09/17 1100  Weight: 158 lb (71.7 kg) 158 lb (71.7 kg) 176 lb  12.9 oz (80.2 kg)    Telemetry    Ventricular paced rhythm at 60 bpm. Personally reviewed.  ECG    Tracing from 07/08/2017 showed a ventricular paced rhythm with possible atrial tracking. Personally reviewed.  Physical Exam   GEN:  Elderly woman.  No acute distress.   Neck: No JVD. Cardiac: RRR, soft systolic murmur, no gallop.  Respiratory: Nonlabored.  Rhonchi and egophony left base. GI: Soft, nontender, bowel sounds present. MS: No edema; No deformity. Neuro:  Nonfocal. Psych: Alert and oriented x 3. Normal affect.  Labs    Chemistry Recent Labs  Lab 07/08/17 0516 07/09/17 1207 07/11/17 0425  NA 140 137 138  K 4.4 4.7 4.0  CL 112* 109 109  CO2 20* 19* 21*  GLUCOSE 145* 168* 111*  BUN 37* 47* 36*  CREATININE 1.69* 1.84* 1.45*  CALCIUM 8.1* 8.3* 8.4*  GFRNONAA 28* 25* 33*  GFRAA 32* 29* 39*  ANIONGAP 8 9 8      Hematology Recent Labs  Lab 07/07/17 0547 07/08/17 0516 07/11/17 0425  WBC 11.8* 13.8* 12.1*  RBC 3.23* 3.40* 3.08*  HGB 9.9* 10.4* 9.5*  HCT 30.0* 31.7* 29.2*  MCV 92.9 93.2 94.8  MCH 30.7 30.6 30.8  MCHC 33.0 32.8 32.5  RDW 13.5 14.0 14.4  PLT 220 251 212    Cardiac Enzymes  Recent Labs  Lab 07/09/17 1207 07/09/17 1856 07/10/17 0124  TROPONINI 0.11* 0.12* 0.09*   No results for input(s): TROPIPOC in the last 168 hours.   BNP Recent Labs  Lab 07/09/17 1206  BNP 1,478.8*     Radiology    Nm Pulmonary Perf And Vent  Result Date: 07/09/2017 CLINICAL DATA:  Shortness of breath EXAM: NUCLEAR MEDICINE VENTILATION - PERFUSION LUNG SCAN TECHNIQUE: Ventilation images were obtained in multiple projections using inhaled aerosol Tc-78m DTPA. Perfusion images were obtained in multiple projections after intravenous injection of Tc-25m-MAA. RADIOPHARMACEUTICALS:  28 mCi of Tc-7m DTPA aerosol inhalation and 3.9 mCi Tc64m-MAA IV COMPARISON:  None Correlation: Chest radiograph 07/09/2017 FINDINGS: Ventilation: Central airway deposition of aerosol.  Markedly impaired ventilation in LEFT lower lobe, likely related to cardiomegaly. Blunting of ventilation at the in the lower lobes at the costophrenic angles question pleural effusions. Perfusion: Unable to obtain all 8 images due to patient being short of breath and orthopneic; unable to obtain RPO and LAO views. Diminished perfusion in LEFT lower lobe matching ventilation, suspect related to cardiomegaly present on chest radiograph. Probable small layered pleural effusions posteriorly. No other segmental or subsegmental perfusion defects identified. Chest radiograph: Enlargement of cardiac silhouette with pulmonary vascular congestion and mild pulmonary edema. Atelectasis versus consolidation LEFT lower lobe and suspected small pleural effusion. IMPRESSION: Very low probability for pulmonary embolism. Electronically Signed   By: Lavonia Dana M.D.   On: 07/09/2017 16:52   Dg Chest Port 1 View  Result Date: 07/09/2017 CLINICAL DATA:  Shortness of breath.  Weakness. EXAM: PORTABLE CHEST 1 VIEW COMPARISON:  07/08/2017. FINDINGS: Stable post CABG changes, enlarged cardiac silhouette and left subclavian pacemaker leads. The pulmonary vasculature remains prominent. Mild decrease in prominence of the interstitial markings. There is also mildly increased density at the left lateral lung base. Diffuse osteopenia. IMPRESSION: 1. Interval patchy atelectasis or pneumonia at the left lateral lung base with possible left pleural fluid. 2. Stable cardiomegaly and pulmonary vascular congestion with mildly improved interstitial pulmonary edema. Electronically Signed   By: Claudie Revering M.D.   On: 07/09/2017 14:17    Cardiac Studies   Echocardiogram 07/10/2017: Study Conclusions  - Left ventricle: Diffuse hypokinesis worse in the md and basal   inferior wal. The cavity size was mildly dilated. Wall thickness   was increased in a pattern of severe LVH. Systolic function was   moderately reduced. The estimated ejection  fraction was in the   range of 35% to 40%. Doppler parameters are consistent with both   elevated ventricular end-diastolic filling pressure and elevated   left atrial filling pressure. - Aortic valve: There was mild regurgitation. - Mitral valve: Calcified annulus. Mildly thickened leaflets .   There was moderate to severe regurgitation. - Left atrium: The atrium was moderately dilated. - Atrial septum: No defect or patent foramen ovale was identified. - Pulmonary arteries: PA peak pressure: 47 mm Hg (S).  Patient Profile     82 y.o. female with a history of atrial fibrillation/flutter status post Medtronic pacemaker, CAD status post CABG in 8921, chronic diastolic heart failure, carotid artery disease status post bilateral CEA, COPD, CKD stage III, type 2 diabetes mellitus, hypertension, hyperlipidemia, now status post right hip surgery.  Cardiac evaluation requested due to dizziness, shortness of breath and reported bradycardia.  Assessment & Plan    1.  Shortness of breath and weakness.  This has improved overall following pacemaker adjustments, currently ventricular pacing at 60 bpm, no further bradycardia.  Ventilation/perfusion lung scan was very low probability for pulmonary embolus.  She does have left lower lobe atelectasis and effusion, less likely infiltrate as she is afebrile (mild leukocytosis).  Follow-up echocardiogram shows decrease in LVEF compared to previous study from August 2018, now 35 to 40% range with diffuse hypokinesis, more prominent in the mid to basal inferior wall.  2.  Multivessel CAD status post CABG in 2006.  She does not report any obvious angina symptoms.  3.  Mixed hyperlipidemia on statin therapy.  4.  Essential hypertension.  5.  Paroxysmal to persistent atrial fibrillation/flutter.  She is on amiodarone and Eliquis.  6.  CKD stage 3, follow-up creatinine down to 1.45.  7.  Postop day #6 status post revision right total hip  acetabulum-posterior.  Discussed with patient and family in room.  Current medical regimen is reasonable in terms of cardiomyopathy including Toprol-XL, hydralazine, and Imdur.  Will give dose of IV Lasix today, may need at least a low-dose standing diuretic following discharge (she had been on HCTZ).  Continue amiodarone and Eliquis.  Not clear that we need to pursue follow-up ischemic testing now to investigate reduced LVEF, this can be reconsidered as an outpatient.  Plan to follow-up tomorrow, she might be able to pursue discharge to rehabilitation.  Signed, Rozann Lesches, MD  07/11/2017, 7:37 AM

## 2017-07-11 NOTE — Progress Notes (Signed)
Physical Therapy Treatment Patient Details Name: Wanda Brooks MRN: [id number] DOB: 05-22-1935 Today's Date: 07/11/2017    History of Present Illness Pt is a 82 year old female s/p revision right total hip acetabulum- posterior with PMHx signficant for but not limited to CAD, COPD, CHF, CABG, MI, DM, pacemaker, multiple R hip surgeries    PT Comments    Pt assisted OOB, used BSC (daughter reports lasix given) and then ambulated short distance in hallway.  Pt also performed LE exercises in recliner.  Pt continues to require cues for posterior hip precautions and also physical assist while performing exercises to maintain correct alignment (tends to perform hip internal rotation). Plan is for d/c to SNF tomorrow.   Follow Up Recommendations  SNF     Equipment Recommendations  Rolling walker with 5" wheels    Recommendations for Other Services       Precautions / Restrictions Precautions Precautions: Posterior Hip;Fall Precaution Comments: able to recall 1/3 posterior hip precautions, reviewed precautions Required Braces or Orthoses: Knee Immobilizer - Right Restrictions Weight Bearing Restrictions: Yes RLE Weight Bearing: Partial weight bearing RLE Partial Weight Bearing Percentage or Pounds: 50%    Mobility  Bed Mobility Overal bed mobility: Needs Assistance Bed Mobility: Supine to Sit     Supine to sit: HOB elevated;Min guard     General bed mobility comments: verbal cues for precautions and technique  Transfers Overall transfer level: Needs assistance Equipment used: Rolling walker (2 wheeled) Transfers: Sit to/from Stand Sit to Stand: Min assist;From elevated surface         General transfer comment: verbal cues for UE and LE positioning, maintaining precautions, very light assist for stabilizing  Ambulation/Gait Ambulation/Gait assistance: Min guard Ambulation Distance (Feet): 40 Feet Assistive device: Rolling walker (2 wheeled) Gait Pattern/deviations:  Step-to pattern;Decreased stance time - right Gait velocity: decreased   General Gait Details: verbal cues for sequence, RW positioning, PWB status, distance to tolerance   Stairs             Wheelchair Mobility    Modified Rankin (Stroke Patients Only)       Balance                                            Cognition Arousal/Alertness: Awake/alert Behavior During Therapy: WFL for tasks assessed/performed Overall Cognitive Status: Within Functional Limits for tasks assessed                                        Exercises Total Joint Exercises Ankle Circles/Pumps: AROM;Both;20 reps;Supine Quad Sets: AROM;10 reps;Both Short Arc Quad: AROM;Right;10 reps Heel Slides: AAROM;Right;10 reps Hip ABduction/ADduction: Right;AAROM;Supine;10 reps    General Comments        Pertinent Vitals/Pain Pain Assessment: 0-10 Pain Score: 2  Pain Location: R hip Pain Descriptors / Indicators: Aching;Sore Pain Intervention(s): Limited activity within patient's tolerance;Repositioned;Monitored during session    Home Living                      Prior Function            PT Goals (current goals can now be found in the care plan section) Progress towards PT goals: Progressing toward goals    Frequency    Min 6X/week  PT Plan Current plan remains appropriate    Co-evaluation              AM-PAC PT "6 Clicks" Daily Activity  Outcome Measure  Difficulty turning over in bed (including adjusting bedclothes, sheets and blankets)?: A Lot Difficulty moving from lying on back to sitting on the side of the bed? : A Lot Difficulty sitting down on and standing up from a chair with arms (e.g., wheelchair, bedside commode, etc,.)?: Unable Help needed moving to and from a bed to chair (including a wheelchair)?: A Little Help needed walking in hospital room?: A Little Help needed climbing 3-5 steps with a railing? : A Lot 6  Click Score: 13    End of Session Equipment Utilized During Treatment: Right knee immobilizer Activity Tolerance: Patient tolerated treatment well Patient left: in chair;with call bell/phone within reach;with family/visitor present Nurse Communication: Mobility status PT Visit Diagnosis: Difficulty in walking, not elsewhere classified (R26.2)     Time: 0093-8182 PT Time Calculation (min) (ACUTE ONLY): 23 min  Charges:  $Gait Training: 8-22 mins $Therapeutic Exercise: 8-22 mins                    G Codes:       Carmelia Bake, PT, DPT 07/11/2017 Pager: [phone]  York Ram E 07/11/2017, 1:19 PM

## 2017-07-11 NOTE — Progress Notes (Signed)
Subjective: 5 Days Post-Op Procedure(s) (LRB): Revision right total hip acetabulum- posterior (Right) Patient reports pain as moderate to right  Hip.Tolerating PO's. Slow progress with PT.     Objective: Vital signs in last 24 hours: Temp:  [98.1 F (36.7 C)-99 F (37.2 C)] 98.1 F (36.7 C) (05/12 0420) Pulse Rate:  [61-66] 64 (05/12 0611) Resp:  [16-18] 16 (05/12 0420) BP: (140-159)/(58-61) 140/60 (05/12 0420) SpO2:  [88 %-96 %] 95 % (05/12 0611)  Intake/Output from previous day: 05/11 0701 - 05/12 0700 In: 780 [P.O.:780] Out: -  Intake/Output this shift: No intake/output data recorded.  Recent Labs    07/11/17 0425  HGB 9.5*   Recent Labs    07/11/17 0425  WBC 12.1*  RBC 3.08*  HCT 29.2*  PLT 212   Recent Labs    07/09/17 1207 07/11/17 0425  NA 137 138  K 4.7 4.0  CL 109 109  CO2 19* 21*  BUN 47* 36*  CREATININE 1.84* 1.45*  GLUCOSE 168* 111*  CALCIUM 8.3* 8.4*   No results for input(s): LABPT, INR in the last 72 hours.  Well nourished. Alert and oriented x3. RRR, Lungs clear, BS x4. Abdomen soft and non tender. Right Calf soft and non tender. Right hip dressing C/D/I. No DVT signs. Compartment soft. No signs of infection.  Right LE grossly neurovascular intact.  Anticipated LOS equal to or greater than 2 midnights due to - Age 80 and older with one or more of the following:  - Obesity  - Expected need for hospital services (PT, OT, Nursing) required for safe  discharge  - Anticipated need for postoperative skilled nursing care or inpatient rehab  - Active co-morbidities: None OR   - Unanticipated findings during/Post Surgery: None  - Patient is a high risk of re-admission due to: None   Assessment/Plan: 5 Days Post-Op Procedure(s) (LRB): Revision right total hip acetabulum- posterior (Right) Up with therapy Plan for discharge tomorrow Discharge to SNF  Pain management Family at bedside   Lajean Manes 07/11/2017, 8:01 AM

## 2017-07-12 ENCOUNTER — Other Ambulatory Visit: Payer: Self-pay | Admitting: Cardiology

## 2017-07-12 DIAGNOSIS — I5043 Acute on chronic combined systolic (congestive) and diastolic (congestive) heart failure: Secondary | ICD-10-CM | POA: Diagnosis not present

## 2017-07-12 DIAGNOSIS — M150 Primary generalized (osteo)arthritis: Secondary | ICD-10-CM | POA: Diagnosis not present

## 2017-07-12 DIAGNOSIS — N289 Disorder of kidney and ureter, unspecified: Secondary | ICD-10-CM

## 2017-07-12 DIAGNOSIS — I481 Persistent atrial fibrillation: Secondary | ICD-10-CM | POA: Diagnosis not present

## 2017-07-12 DIAGNOSIS — M6281 Muscle weakness (generalized): Secondary | ICD-10-CM | POA: Diagnosis not present

## 2017-07-12 DIAGNOSIS — J449 Chronic obstructive pulmonary disease, unspecified: Secondary | ICD-10-CM | POA: Diagnosis not present

## 2017-07-12 DIAGNOSIS — D649 Anemia, unspecified: Secondary | ICD-10-CM | POA: Diagnosis not present

## 2017-07-12 DIAGNOSIS — E1122 Type 2 diabetes mellitus with diabetic chronic kidney disease: Secondary | ICD-10-CM | POA: Diagnosis not present

## 2017-07-12 DIAGNOSIS — Z7401 Bed confinement status: Secondary | ICD-10-CM | POA: Diagnosis not present

## 2017-07-12 DIAGNOSIS — F419 Anxiety disorder, unspecified: Secondary | ICD-10-CM | POA: Diagnosis not present

## 2017-07-12 DIAGNOSIS — I2583 Coronary atherosclerosis due to lipid rich plaque: Secondary | ICD-10-CM | POA: Diagnosis not present

## 2017-07-12 DIAGNOSIS — Z7901 Long term (current) use of anticoagulants: Secondary | ICD-10-CM | POA: Diagnosis not present

## 2017-07-12 DIAGNOSIS — N183 Chronic kidney disease, stage 3 (moderate): Secondary | ICD-10-CM | POA: Diagnosis not present

## 2017-07-12 DIAGNOSIS — Z96641 Presence of right artificial hip joint: Secondary | ICD-10-CM | POA: Diagnosis not present

## 2017-07-12 DIAGNOSIS — I2510 Atherosclerotic heart disease of native coronary artery without angina pectoris: Secondary | ICD-10-CM | POA: Diagnosis not present

## 2017-07-12 DIAGNOSIS — K219 Gastro-esophageal reflux disease without esophagitis: Secondary | ICD-10-CM | POA: Diagnosis not present

## 2017-07-12 DIAGNOSIS — T84030D Mechanical loosening of internal right hip prosthetic joint, subsequent encounter: Secondary | ICD-10-CM | POA: Diagnosis not present

## 2017-07-12 DIAGNOSIS — I482 Chronic atrial fibrillation: Secondary | ICD-10-CM | POA: Diagnosis not present

## 2017-07-12 DIAGNOSIS — I5032 Chronic diastolic (congestive) heart failure: Secondary | ICD-10-CM | POA: Diagnosis not present

## 2017-07-12 DIAGNOSIS — I130 Hypertensive heart and chronic kidney disease with heart failure and stage 1 through stage 4 chronic kidney disease, or unspecified chronic kidney disease: Secondary | ICD-10-CM | POA: Diagnosis not present

## 2017-07-12 DIAGNOSIS — Z87820 Personal history of traumatic brain injury: Secondary | ICD-10-CM | POA: Diagnosis not present

## 2017-07-12 DIAGNOSIS — E785 Hyperlipidemia, unspecified: Secondary | ICD-10-CM | POA: Diagnosis not present

## 2017-07-12 DIAGNOSIS — J329 Chronic sinusitis, unspecified: Secondary | ICD-10-CM | POA: Diagnosis not present

## 2017-07-12 DIAGNOSIS — Z950 Presence of cardiac pacemaker: Secondary | ICD-10-CM | POA: Diagnosis not present

## 2017-07-12 DIAGNOSIS — R262 Difficulty in walking, not elsewhere classified: Secondary | ICD-10-CM | POA: Diagnosis not present

## 2017-07-12 DIAGNOSIS — R001 Bradycardia, unspecified: Secondary | ICD-10-CM | POA: Diagnosis not present

## 2017-07-12 DIAGNOSIS — M2550 Pain in unspecified joint: Secondary | ICD-10-CM | POA: Diagnosis not present

## 2017-07-12 DIAGNOSIS — R41841 Cognitive communication deficit: Secondary | ICD-10-CM | POA: Diagnosis not present

## 2017-07-12 DIAGNOSIS — I779 Disorder of arteries and arterioles, unspecified: Secondary | ICD-10-CM | POA: Diagnosis not present

## 2017-07-12 MED ORDER — APIXABAN 5 MG PO TABS: 5.0000 mg | ORAL_TABLET | Freq: Two times a day (BID) | ORAL | Status: DC

## 2017-07-12 MED ORDER — FUROSEMIDE 20 MG PO TABS: 20.0000 mg | ORAL_TABLET | Freq: Every day | ORAL | 0 refills | Status: DC

## 2017-07-12 MED ORDER — FUROSEMIDE 20 MG PO TABS
20.0000 mg | ORAL_TABLET | Freq: Every day | ORAL | Status: DC
  Administered 2017-07-12: 20 mg via ORAL
  Filled 2017-07-12: qty 1

## 2017-07-12 NOTE — Progress Notes (Signed)
Order for post hospital BMP in 1 week placed.

## 2017-07-12 NOTE — Plan of Care (Signed)
  Problem: Activity: Goal: Risk for activity intolerance will decrease Outcome: Adequate for Discharge   Problem: Education: Goal: Understanding of discharge needs will improve Outcome: Adequate for Discharge   Problem: Activity: Goal: Ability to avoid complications of mobility impairment will improve Outcome: Adequate for Discharge Goal: Ability to tolerate increased activity will improve Outcome: Adequate for Discharge

## 2017-07-12 NOTE — Clinical Social Work Placement (Signed)
Pt discharged- will admit to [facility] room 203-B, report#[phone]. Dc information provided via the HUB Daughter at bedside agreeable to plan PTAR transportation arranged  Sharren Bridge, MSW, LCSW Clinical Social Work 07/12/2017 [phone]    CLINICAL SOCIAL WORK PLACEMENT  NOTE  Date:  07/12/2017  Patient Details  Name: Wanda Brooks MRN: [id number] Date of Birth: [birth year]  Clinical Social Work is seeking post-discharge placement for this patient at the [place] level of care (*CSW will initial, date and re-position this form in  chart as items are completed):  Yes   Patient/family provided with [place] Work Department's list of facilities offering this level of care within the geographic area requested by the patient (or if unable, by the patient's family).  Yes   Patient/family informed of their freedom to choose among providers that offer the needed level of care, that participate in Medicare, Medicaid or managed care program needed by the patient, have an available bed and are willing to accept the patient.  Yes   Patient/family informed of [facility]'s ownership interest in [facility] and [facility], as well as of the fact that they are under no obligation to receive care at these facilities.  PASRR submitted to EDS on 07/07/17     PASRR number received on 07/07/17     Existing PASRR number confirmed on       FL2 transmitted to all facilities in geographic area requested by pt/family on       FL2 transmitted to all facilities within larger geographic area on 07/07/17     Patient informed that his/her managed care company has contracts with or will negotiate with certain facilities, including the following:  [facility] [facility] [employer] informed of bed offers received.  Patient chooses bed at [facility]     Physician recommends and patient chooses bed at      Patient to be transferred to  [facility] on  .  Patient to be transferred to facility by       Patient family notified on   of transfer.  Name of family member notified:        PHYSICIAN       Additional Comment:    _______________________________________________ Nila Nephew, LCSW 07/12/2017, 3:36 PM

## 2017-07-12 NOTE — Progress Notes (Addendum)
Progress Note  Patient Name: Wanda Brooks Date of Encounter: 07/12/2017  Primary Cardiologist: Virl Axe, MD   Subjective   Feels better today. Breathing much improved. Dizziness resolved. Her dauther is by bedside. 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 Currently CP free.   Inpatient Medications    Scheduled Meds: . amiodarone  200 mg Oral Daily  . amLODipine  10 mg Oral Q breakfast  . apixaban  2.5 mg Oral Q12H  . atorvastatin  20 mg Oral q1800  . docusate sodium  100 mg Oral BID  . famotidine  20 mg Oral Daily  . ferrous sulfate  325 mg Oral TID PC  . guaiFENesin  600 mg Oral BID  . hydrALAZINE  10 mg Oral Q8H  . isosorbide mononitrate  120 mg Oral Q breakfast  . lidocaine  10 mL Intradermal Once  . methylPREDNISolone acetate  80 mg Intra-articular Once  . metoprolol succinate  100 mg Oral BID  . oxybutynin  5 mg Oral QHS  . polyethylene glycol  17 g Oral BID  . traMADol  50 mg Oral Q6H   Continuous Infusions: . sodium chloride Stopped (07/08/17 0745)  . methocarbamol (ROBAXIN)  IV 500 mg (07/06/17 1336)   PRN Meds: acetaminophen, albuterol, alum & mag hydroxide-simeth, bisacodyl, diphenhydrAMINE, HYDROcodone-acetaminophen, HYDROcodone-acetaminophen, LORazepam, magnesium citrate, menthol-cetylpyridinium **OR** phenol, methocarbamol **OR** methocarbamol (ROBAXIN)  IV, metoCLOPramide **OR** metoCLOPramide (REGLAN) injection, morphine injection, ondansetron **OR** ondansetron (ZOFRAN) IV   Vital Signs    Vitals:   07/11/17 1415 07/11/17 2117 07/12/17 0432 07/12/17 0757  BP: (!) 154/57 (!) 163/59 (!) 155/67 (!) 155/58  Pulse: 61 62 72 63  Resp: (!) 24 17 18 18   Temp: 98.4 F (36.9 C) 98.3 F (36.8 C) 98.3 F (36.8 C) 98.3 F (36.8 C)  TempSrc: Oral Oral Oral Oral  SpO2: 92% 91% 92% 94%  Weight:      Height:        Intake/Output Summary (Last 24 hours) at 07/12/2017  0926 Last data filed at 07/12/2017 7867 Gross per 24 hour  Intake 600 ml  Output -  Net 600 ml   Filed Weights   07/06/17 1017 07/06/17 1445 07/09/17 1100  Weight: 158 lb (71.7 kg) 158 lb (71.7 kg) 176 lb 12.9 oz (80.2 kg)    Telemetry    Paced rhythm, 60s  - Personally Reviewed  ECG    -Ventricular-paced rhythm, 63 bpm Personally Reviewed  Physical Exam   GEN: No acute distress.   Neck: No JVD Cardiac: RRR, no murmurs, rubs, or gallops.  Respiratory: crackles at the bases that improve after cough, c/w atelectasis  GI: Soft, nontender, non-distended  MS: No edema; No deformity. Neuro:  Nonfocal  Psych: Normal affect   Labs    Chemistry Recent Labs  Lab 07/08/17 0516 07/09/17 1207 07/11/17 0425  NA 140 137 138  K 4.4 4.7 4.0  CL 112* 109 109  CO2 20* 19* 21*  GLUCOSE 145* 168* 111*  BUN 37* 47* 36*  CREATININE 1.69* 1.84* 1.45*  CALCIUM 8.1* 8.3* 8.4*  GFRNONAA 28* 25* 33*  GFRAA 32* 29* 39*  ANIONGAP 8 9 8      Hematology Recent Labs  Lab 07/07/17 0547 07/08/17 0516 07/11/17 0425  WBC 11.8* 13.8* 12.1*  RBC 3.23* 3.40* 3.08*  HGB 9.9* 10.4* 9.5*  HCT 30.0* 31.7* 29.2*  MCV 92.9 93.2  94.8  MCH 30.7 30.6 30.8  MCHC 33.0 32.8 32.5  RDW 13.5 14.0 14.4  PLT 220 251 212    Cardiac Enzymes Recent Labs  Lab 07/09/17 1207 07/09/17 1856 07/10/17 0124  TROPONINI 0.11* 0.12* 0.09*   No results for input(s): TROPIPOC in the last 168 hours.   BNP Recent Labs  Lab 07/09/17 1206  BNP 1,478.8*     DDimer No results for input(s): DDIMER in the last 168 hours.   Radiology    No results found.  Cardiac Studies   2D Echo 07/10/17 Study Conclusions  - Left ventricle: Diffuse hypokinesis worse in the md and basal   inferior wal. The cavity size was mildly dilated. Wall thickness   was increased in a pattern of severe LVH. Systolic function was   moderately reduced. The estimated ejection fraction was in the   range of 35% to 40%. Doppler  parameters are consistent with both   elevated ventricular end-diastolic filling pressure and elevated   left atrial filling pressure. - Aortic valve: There was mild regurgitation. - Mitral valve: Calcified annulus. Mildly thickened leaflets .   There was moderate to severe regurgitation. - Left atrium: The atrium was moderately dilated. - Atrial septum: No defect or patent foramen ovale was identified. - Pulmonary arteries: PA peak pressure: 47 mm Hg (S).  Patient Profile     82 y.o. female with a history of atrial fibrillation/flutter status post Medtronic pacemaker, CAD status post CABG in 0932, chronic diastolic heart failure, carotid artery disease status post bilateral CEA, COPD, CKD stage III, type 2 diabetes mellitus, hypertension, hyperlipidemia, now status post right hip surgery 07/06/17. Cardiac evaluation requested due to dizziness, shortness of breathand reported bradycardia.   Assessment & Plan    1. SOB and Weakness: multifactorial>>bradcyardia and HF.  V/Q scan low prob for PE. CXR with mild interstitial pulmonary edema. IV Lasix given, dyspnea improved. EF reduced on echo, down to 35-40% with diffuse hypokinesis. Was also noted to be bradycardic, despite PPM. Per chart, her pacemaker settings were adjusted this admit and bradycardia improved. Weakness and dizziness resolved.   2. Acute Combined Systolic and Diastolic CHF: CXR w/ mild pulmonary edema. BNP on 07/09/17 was abnormal at 1,478. 2D echo with reduced LVEF, 35-40%. Doppler parameters are consistent with both elevated ventricular end-diastolic filling pressure and elevated left atrial filling pressure. Dose of IV Lasix was given yesterday resulting in symptomatic improvement. Intake documented but no output reordered. She was on HCTZ prior to admit but may need to change to PO Lasix at discharge.   3. Cardiomyopathy: new diagnosis. Echo in 09/2016 showed normal LVEF at 55%, now 35-40% with diffuse hypokinesis worse in the  mid and basal inferior wall. She has known CAD s/p CABG in 2006 and has had CP in recent months but currently CP free. For now, continue medical therapy (metoprolol, nitrate and hydralazine, no ACE/ARB due to renal function) and will need to consider outpatient ischemic w/u once she has recovered from her surgery.   4. CAD:  H/o CABG in 2006. Currently CP free but EF reduced and she has had a few episodes of CP in recent months. Continue medical therapy. Further w/u can be done in outpatient setting.   5. PAF: rate is controlled. She is on Eliquis for a/c. ? If she should be on full dose, 5 mg BID instead of 2.5 mg BID. She is <80 y/o, weight is > 60 Kg. SCr 1.4.   6. S/p Hip Surgery:  management per ortho.   For questions or updates, please contact [place] Please consult [email or web address] for contact info under Cardiology/STEMI.      Signed, Lyda Jester, PA-C  07/12/2017, 9:26 AM    As above, patient seen and examined.  Her dyspnea has resolved.  She denies chest pain.  Echocardiogram shows newly reduced LV function and moderate to severe mitral regurgitation.  Etiology of LV dysfunction unclear.  Given recent hip surgery we will not pursue further ischemia evaluation at this point.  We will arrange outpatient nuclear study for risk stratification after she recovers.  Note she does have baseline renal insufficiency and would be at risk for contrast nephropathy if catheterization required.  Continue hydralazine/nitrates (not on ACE inhibitor/ARB because of renal insufficiency) and beta-blockade.  Add Lasix 20 mg daily in place of preadmission hydrochlorothiazide.  Check potassium and renal function in 1 week.  Follow-up Dr. Caryl Comes after discharge.  We will also arrange a transition of care appointment in 1 week.  Patient can be discharged from a cardiac standpoint.  Continue amiodarone for rhythm control of atrial fibrillation.  Increase apixaban to 5 mg twice daily.  Kirk Ruths, MD

## 2017-07-12 NOTE — Progress Notes (Signed)
Report given to Demetrios Loll, LPN at [facility]. All questions answered and contact number provided. Pt and dtr aware of transfer and agreeable.

## 2017-07-12 NOTE — Progress Notes (Signed)
Patient ID: Wanda Brooks, female   DOB: [birth year], 82 y.o.   MRN: [id number] Subjective: 6 Days Post-Op Procedure(s) (LRB): Revision right total hip acetabulum- posterior (Right)    Patient reports pain as moderate. But improving.  Pacemaker adjustment successful at maintaining HR at more normal level.  No other events  Objective:   VITALS:   Vitals:   07/12/17 0757 07/12/17 1234  BP: (!) 155/58 (!) 133/47  Pulse: 63 62  Resp: 18 16  Temp: 98.3 F (36.8 C) 98.4 F (36.9 C)  SpO2: 94% 91%    Neurovascular intact Incision: dressing C/D/I  LABS Recent Labs    07/11/17 0425  HGB 9.5*  HCT 29.2*  WBC 12.1*  PLT 212    Recent Labs    07/11/17 0425  NA 138  K 4.0  BUN 36*  CREATININE 1.45*  GLUCOSE 111*    No results for input(s): LABPT, INR in the last 72 hours.   Assessment/Plan: 6 Days Post-Op Procedure(s) (LRB): Revision right total hip acetabulum- posterior (Right)   Up with therapy Discharge to SNF  RTC in 2 weeks Knee immobilizer PWB RLE until further directed

## 2017-07-13 DIAGNOSIS — M150 Primary generalized (osteo)arthritis: Secondary | ICD-10-CM | POA: Diagnosis not present

## 2017-07-13 DIAGNOSIS — R001 Bradycardia, unspecified: Secondary | ICD-10-CM | POA: Diagnosis not present

## 2017-07-13 DIAGNOSIS — I482 Chronic atrial fibrillation: Secondary | ICD-10-CM | POA: Diagnosis not present

## 2017-07-13 DIAGNOSIS — I5032 Chronic diastolic (congestive) heart failure: Secondary | ICD-10-CM | POA: Diagnosis not present

## 2017-07-14 ENCOUNTER — Telehealth: Payer: Self-pay | Admitting: Internal Medicine

## 2017-07-14 NOTE — Telephone Encounter (Signed)
7-10 day TOC pt-appt with Caryl Comes 07-20-17 at 915am in [place].

## 2017-07-16 ENCOUNTER — Other Ambulatory Visit: Payer: Self-pay

## 2017-07-16 MED ORDER — HYDROCODONE-ACETAMINOPHEN 7.5-325 MG PO TABS: 1.0000 | ORAL_TABLET | ORAL | 0 refills | Status: DC | PRN

## 2017-07-16 NOTE — Telephone Encounter (Signed)
Rx sent to [facility] phone : [phone] , fax : [phone]  

## 2017-07-16 NOTE — Telephone Encounter (Signed)
**Note De-Identified Wanda Brooks Obfuscation** The pt is following up in our [place] office. Will forward message to that office to contact pt.

## 2017-07-16 NOTE — Telephone Encounter (Signed)
Patient's daughter, ok per DPR, contacted regarding discharge from 07/12/17 on [facility].   Patient'S DAUGHTER understands to follow up with provider ? On 07/20/17 at 9:15 AM at [facility].  Patient'S DAUGHTER understands discharge instructions? Yes Patient'S DAUGHTER understands medications and regiment? Yes  Patient'S DAUGHTER understands to bring all medications to this visit? Yes   Currently patient is in rehab facility at this time. She asked if patient was still supposed to be taking hydralazine. Advised it was listed on discharge summary and suggested she speak with the facility concerning this. She verbalized understanding.

## 2017-07-16 NOTE — Telephone Encounter (Signed)
No answer. Left message to call back.   

## 2017-07-19 ENCOUNTER — Other Ambulatory Visit: Payer: Self-pay

## 2017-07-19 MED ORDER — LORAZEPAM 0.5 MG PO TABS: 0.5000 mg | ORAL_TABLET | Freq: Two times a day (BID) | ORAL | 0 refills | Status: DC | PRN

## 2017-07-19 NOTE — Telephone Encounter (Signed)
Rx sent to [facility] phone : [phone] , fax : [phone]  

## 2017-07-20 ENCOUNTER — Encounter: Payer: Self-pay | Admitting: Internal Medicine

## 2017-07-20 ENCOUNTER — Ambulatory Visit (INDEPENDENT_AMBULATORY_CARE_PROVIDER_SITE_OTHER): Payer: Medicare Other | Admitting: Internal Medicine

## 2017-07-20 VITALS — BP 140/60 | HR 64 | Ht 65.0 in | Wt 151.0 lb

## 2017-07-20 DIAGNOSIS — I481 Persistent atrial fibrillation: Secondary | ICD-10-CM | POA: Diagnosis not present

## 2017-07-20 DIAGNOSIS — I5032 Chronic diastolic (congestive) heart failure: Secondary | ICD-10-CM

## 2017-07-20 DIAGNOSIS — Z950 Presence of cardiac pacemaker: Secondary | ICD-10-CM

## 2017-07-20 DIAGNOSIS — R001 Bradycardia, unspecified: Secondary | ICD-10-CM

## 2017-07-20 DIAGNOSIS — I4819 Other persistent atrial fibrillation: Secondary | ICD-10-CM

## 2017-07-20 MED ORDER — FLUTICASONE PROPIONATE (INHAL) 50 MCG/BLIST IN AEPB: 1.0000 | INHALATION_SPRAY | Freq: Two times a day (BID) | RESPIRATORY_TRACT | 6 refills | Status: DC
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
## 2017-07-20 NOTE — Patient Instructions (Addendum)
Medication Instructions: - Your physician has recommended you make the following change in your medication:  1) START flovent 50 mcg- inhale 1 puff into the lungs twice daily  Labwork: - none ordered  Procedures/Testing: - Your physician has recommended that you have a Cardioversion (DCCV). Electrical Cardioversion uses a jolt of electricity to your heart either through paddles or wired patches attached to your chest. This is a controlled, usually prescheduled, procedure. Defibrillation is done under light anesthesia in the hospital, and you usually go home the day of the procedure. This is done to get your heart back into a normal rhythm. You are not awake for the procedure.   You are scheduled for a Cardioversion on ________________ with Dr.___________ Please arrive at the [place] [facility] at _________ a.m. on the day of your procedure.  DIET INSTRUCTIONS:  Nothing to eat or drink after midnight except your medications with a  sip of water.         1) Labs: ______n/a____________  2) Medications:  YOU MAY TAKE ALL of your medications with a small amount of water the morning of your procedure except- hold lasix (furosemide)   3) Must have a responsible person to drive you home.  4) Bring a current list of your medications and current insurance cards.    If you have any questions after you get home, please call the office at [phone]   Follow-Up: - Your physician recommends that you schedule a follow-up appointment in: 3 months with Dr. Caryl Comes.   Any Additional Special Instructions Will Be Listed Below (If Applicable).     If you need a refill on your cardiac medications before your next appointment, please call your pharmacy.

## 2017-07-23 ENCOUNTER — Telehealth: Payer: Self-pay

## 2017-07-23 NOTE — Telephone Encounter (Signed)
I spoke to [place] and she states that she was advised that she would have to contact PCP from the rehab ctr as they also told her that they did not have a provider to see the pt... Additionally, she reports pt was having other issues and was advised she would have to be seen by her cardiologist and [name] had to take pt to Cardiologist on Tues because they stated they did not have anyone to evaluate her--- daughter states pt has been wheezing as well... Please advise

## 2017-07-23 NOTE — Telephone Encounter (Signed)
There should be a provider that comes to the rehab for acute issues like this. Not comfortable prescribing her anything without seeing her.

## 2017-07-23 NOTE — Telephone Encounter (Signed)
Copied from [place] [phone]. Topic: General - Other >> Jul 23, 2017 11:24 AM Yvette Rack wrote: Reason for CRM: pt daughter [name] [phone] calling stating that her mother is in rehab for her hip and she cant bring her into the office and states that she has a sinus infection with thick yellow drainage and blowing nose with a cough and would like something called into her pharmacy [facility] [place], [place] AT [address] [phone] (Phone) [phone] (Fax)

## 2017-07-23 NOTE — Telephone Encounter (Signed)
Pt last seen 03/25/17.

## 2017-07-25 NOTE — Telephone Encounter (Signed)
She will need evaluation in the office.

## 2017-07-28 NOTE — Telephone Encounter (Signed)
Spoke to daughter [name] and she reports a NP at the facility prescribed pt a Zpack and pt seems to be improving

## 2017-07-31 ENCOUNTER — Encounter
Admission: RE | Admit: 2017-07-31 | Discharge: 2017-07-31 | Disposition: A | Discharge status: Home / Self Care | Payer: Medicare Other | Source: Ambulatory Visit | Attending: Internal Medicine | Admitting: Internal Medicine

## 2017-08-03 ENCOUNTER — Other Ambulatory Visit: Payer: Self-pay | Admitting: Internal Medicine

## 2017-08-06 LAB — CUP PACEART INCLINIC DEVICE CHECK
Implantable Lead Implant Date: 20130529
Implantable Lead Model: 4574
Implantable Pulse Generator Implant Date: 20130529
MDC IDC LEAD IMPLANT DT: 20130529
MDC IDC LEAD LOCATION: 753859
MDC IDC LEAD LOCATION: 753860
MDC IDC SESS DTM: 20190607153802

## 2017-08-10 ENCOUNTER — Encounter: Payer: Self-pay | Admitting: Gerontology

## 2017-08-10 NOTE — Progress Notes (Signed)
Pt D/C'ed prior to assessment  This encounter was created in error - please disregard.

## 2017-08-11 ENCOUNTER — Encounter: Payer: Self-pay | Admitting: *Deleted

## 2017-08-12 ENCOUNTER — Other Ambulatory Visit: Payer: Self-pay | Admitting: Internal Medicine

## 2017-08-12 DIAGNOSIS — I4819 Other persistent atrial fibrillation: Secondary | ICD-10-CM

## 2017-08-16 DIAGNOSIS — I130 Hypertensive heart and chronic kidney disease with heart failure and stage 1 through stage 4 chronic kidney disease, or unspecified chronic kidney disease: Secondary | ICD-10-CM | POA: Diagnosis not present

## 2017-08-16 DIAGNOSIS — M150 Primary generalized (osteo)arthritis: Secondary | ICD-10-CM | POA: Diagnosis not present

## 2017-08-16 DIAGNOSIS — T84030D Mechanical loosening of internal right hip prosthetic joint, subsequent encounter: Secondary | ICD-10-CM | POA: Diagnosis not present

## 2017-08-16 DIAGNOSIS — I482 Chronic atrial fibrillation: Secondary | ICD-10-CM | POA: Diagnosis not present

## 2017-08-16 DIAGNOSIS — J449 Chronic obstructive pulmonary disease, unspecified: Secondary | ICD-10-CM | POA: Diagnosis not present

## 2017-08-16 DIAGNOSIS — E1122 Type 2 diabetes mellitus with diabetic chronic kidney disease: Secondary | ICD-10-CM | POA: Diagnosis not present

## 2017-08-18 DIAGNOSIS — I482 Chronic atrial fibrillation: Secondary | ICD-10-CM | POA: Diagnosis not present

## 2017-08-18 DIAGNOSIS — T84030D Mechanical loosening of internal right hip prosthetic joint, subsequent encounter: Secondary | ICD-10-CM | POA: Diagnosis not present

## 2017-08-18 DIAGNOSIS — I130 Hypertensive heart and chronic kidney disease with heart failure and stage 1 through stage 4 chronic kidney disease, or unspecified chronic kidney disease: Secondary | ICD-10-CM | POA: Diagnosis not present

## 2017-08-18 DIAGNOSIS — M150 Primary generalized (osteo)arthritis: Secondary | ICD-10-CM | POA: Diagnosis not present

## 2017-08-18 DIAGNOSIS — J449 Chronic obstructive pulmonary disease, unspecified: Secondary | ICD-10-CM | POA: Diagnosis not present

## 2017-08-18 DIAGNOSIS — E1122 Type 2 diabetes mellitus with diabetic chronic kidney disease: Secondary | ICD-10-CM | POA: Diagnosis not present

## 2017-08-19 DIAGNOSIS — M1712 Unilateral primary osteoarthritis, left knee: Secondary | ICD-10-CM | POA: Diagnosis not present

## 2017-08-19 DIAGNOSIS — Z96641 Presence of right artificial hip joint: Secondary | ICD-10-CM | POA: Diagnosis not present

## 2017-08-19 DIAGNOSIS — M25562 Pain in left knee: Secondary | ICD-10-CM | POA: Diagnosis not present

## 2017-08-19 DIAGNOSIS — Z471 Aftercare following joint replacement surgery: Secondary | ICD-10-CM | POA: Diagnosis not present

## 2017-08-20 ENCOUNTER — Encounter: Payer: Self-pay | Admitting: *Deleted

## 2017-08-20 ENCOUNTER — Ambulatory Visit
Admission: RE | Admit: 2017-08-20 | Discharge: 2017-08-20 | Disposition: A | Discharge status: Home / Self Care | Payer: Medicare Other | Source: Ambulatory Visit | Attending: Cardiovascular Disease | Admitting: Cardiovascular Disease

## 2017-08-20 ENCOUNTER — Ambulatory Visit: Payer: Medicare Other | Admitting: Certified Registered Nurse Anesthetist

## 2017-08-20 ENCOUNTER — Other Ambulatory Visit: Payer: Self-pay

## 2017-08-20 ENCOUNTER — Other Ambulatory Visit: Payer: Self-pay | Admitting: Internal Medicine

## 2017-08-20 ENCOUNTER — Encounter
Admission: RE | Disposition: A | Discharge status: Home / Self Care | Payer: Self-pay | Source: Ambulatory Visit | Attending: Cardiovascular Disease

## 2017-08-20 DIAGNOSIS — J189 Pneumonia, unspecified organism: Secondary | ICD-10-CM | POA: Diagnosis not present

## 2017-08-20 DIAGNOSIS — I110 Hypertensive heart disease with heart failure: Secondary | ICD-10-CM | POA: Insufficient documentation

## 2017-08-20 DIAGNOSIS — I4891 Unspecified atrial fibrillation: Secondary | ICD-10-CM | POA: Diagnosis not present

## 2017-08-20 DIAGNOSIS — J449 Chronic obstructive pulmonary disease, unspecified: Secondary | ICD-10-CM | POA: Insufficient documentation

## 2017-08-20 DIAGNOSIS — E1122 Type 2 diabetes mellitus with diabetic chronic kidney disease: Secondary | ICD-10-CM | POA: Diagnosis not present

## 2017-08-20 DIAGNOSIS — I483 Typical atrial flutter: Secondary | ICD-10-CM | POA: Diagnosis not present

## 2017-08-20 DIAGNOSIS — I2510 Atherosclerotic heart disease of native coronary artery without angina pectoris: Secondary | ICD-10-CM | POA: Diagnosis not present

## 2017-08-20 DIAGNOSIS — I4819 Other persistent atrial fibrillation: Secondary | ICD-10-CM

## 2017-08-20 DIAGNOSIS — I482 Chronic atrial fibrillation: Secondary | ICD-10-CM | POA: Diagnosis not present

## 2017-08-20 DIAGNOSIS — Z79899 Other long term (current) drug therapy: Secondary | ICD-10-CM | POA: Insufficient documentation

## 2017-08-20 DIAGNOSIS — F418 Other specified anxiety disorders: Secondary | ICD-10-CM | POA: Diagnosis not present

## 2017-08-20 DIAGNOSIS — F17200 Nicotine dependence, unspecified, uncomplicated: Secondary | ICD-10-CM | POA: Insufficient documentation

## 2017-08-20 DIAGNOSIS — R0789 Other chest pain: Secondary | ICD-10-CM | POA: Diagnosis not present

## 2017-08-20 DIAGNOSIS — I480 Paroxysmal atrial fibrillation: Secondary | ICD-10-CM | POA: Diagnosis not present

## 2017-08-20 DIAGNOSIS — I509 Heart failure, unspecified: Secondary | ICD-10-CM | POA: Diagnosis not present

## 2017-08-20 DIAGNOSIS — E1151 Type 2 diabetes mellitus with diabetic peripheral angiopathy without gangrene: Secondary | ICD-10-CM | POA: Diagnosis not present

## 2017-08-20 DIAGNOSIS — I130 Hypertensive heart and chronic kidney disease with heart failure and stage 1 through stage 4 chronic kidney disease, or unspecified chronic kidney disease: Secondary | ICD-10-CM | POA: Diagnosis not present

## 2017-08-20 DIAGNOSIS — N183 Chronic kidney disease, stage 3 (moderate): Secondary | ICD-10-CM | POA: Diagnosis not present

## 2017-08-20 DIAGNOSIS — R0602 Shortness of breath: Secondary | ICD-10-CM | POA: Diagnosis not present

## 2017-08-20 DIAGNOSIS — J440 Chronic obstructive pulmonary disease with acute lower respiratory infection: Secondary | ICD-10-CM | POA: Diagnosis not present

## 2017-08-20 DIAGNOSIS — I481 Persistent atrial fibrillation: Secondary | ICD-10-CM | POA: Diagnosis not present

## 2017-08-20 DIAGNOSIS — I739 Peripheral vascular disease, unspecified: Secondary | ICD-10-CM | POA: Insufficient documentation

## 2017-08-20 HISTORY — PX: CARDIOVERSION: EP1203

## 2017-08-20 SURGERY — CARDIOVERSION (CATH LAB)
Anesthesia: General

## 2017-08-20 MED ORDER — PROPOFOL 10 MG/ML IV BOLUS
INTRAVENOUS | Status: AC
  Filled 2017-08-20: qty 20

## 2017-08-20 MED ORDER — SODIUM CHLORIDE 0.9 % IV SOLN: INTRAVENOUS | Status: DC

## 2017-08-20 MED ORDER — PROPOFOL 10 MG/ML IV BOLUS
INTRAVENOUS | Status: DC | PRN
  Administered 2017-08-20: 20 mg via INTRAVENOUS
  Administered 2017-08-20: 30 mg via INTRAVENOUS

## 2017-08-20 NOTE — Telephone Encounter (Signed)
Last filled 02/04/17 #270 w/ 1 refill... Please advise

## 2017-08-20 NOTE — Transfer of Care (Addendum)
Immediate Anesthesia Transfer of Care Note  Patient: Wanda Brooks  Procedure(s) Performed: CARDIOVERSION (N/A )  Patient Location: PACU  Anesthesia Type:General  Level of Consciousness: sedated  Airway & Oxygen Therapy: Patient Spontanous Breathing and Patient connected to nasal cannula oxygen  Post-op Assessment: Report given to RN and Post -op Vital signs reviewed and stable  Post vital signs: stable  Last Vitals:  Vitals Value Taken Time  BP    Temp    Pulse    Resp    SpO2      Last Pain:  Vitals:   08/20/17 0656  TempSrc: Oral  PainSc: 0-No pain         Complications: No apparent anesthesia complications

## 2017-08-20 NOTE — CV Procedure (Signed)
Cardioversion procedure note For atrial flutter, typical  Procedure Details:  Consent: Risks of procedure as well as the alternatives and risks of each were explained to the (patient/caregiver). Consent for procedure obtained.  Time Out: Verified patient identification, verified procedure, site/side was marked, verified correct patient position, special equipment/implants available, medications/allergies/relevent history reviewed, required imaging and test results available. Performed  Patient placed on cardiac monitor, pulse oximetry, supplemental oxygen as necessary.  Sedation given: 60 mg propofol IV, Dr. Andree Elk Pacer pads placed anterior and posterior chest.   Cardioverted 1 time(s).  Cardioverted at  150 J. Synchronized biphasic Converted to NSR   Evaluation: Findings: Post procedure EKG shows: NSR Complications: None Patient did tolerate procedure well.  Time Spent Directly with the Patient:  89 minutes   Esmond Plants, M.D., Ph.D.

## 2017-08-20 NOTE — Anesthesia Post-op Follow-up Note (Signed)
Anesthesia QCDR form completed.        
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
## 2017-08-20 NOTE — Anesthesia Postprocedure Evaluation (Signed)
Anesthesia Post Note  Patient: Wanda Brooks  Procedure(s) Performed: CARDIOVERSION (N/A )  Patient location during evaluation: PACU Anesthesia Type: General Level of consciousness: awake and alert Pain management: pain level controlled Vital Signs Assessment: post-procedure vital signs reviewed and stable Respiratory status: spontaneous breathing, nonlabored ventilation, respiratory function stable and patient connected to nasal cannula oxygen Cardiovascular status: blood pressure returned to baseline and stable Postop Assessment: no apparent nausea or vomiting Anesthetic complications: no     Last Vitals:  Vitals:   08/20/17 0656  BP: (!) 144/91  Pulse: 68  Resp: 20  Temp: 36.7 C  SpO2: 96%    Last Pain:  Vitals:   08/20/17 0656  TempSrc: Oral  PainSc: 0-No pain                 Molli Barrows

## 2017-08-22 NOTE — H&P (Signed)
H&P Addendum, pre-cardioversion for atrial flutter  Patient was seen and evaluated prior to -cardioversion procedure Symptoms, prior testing details again confirmed with the patient Patient examined, no significant change from prior exam Lab work reviewed in detail personally by myself Patient understands risk and benefit of the procedure, willing to proceed  Signed, Esmond Plants, MD, Ph.D [facility]

## 2017-08-23 ENCOUNTER — Encounter: Payer: Self-pay | Admitting: Cardiovascular Disease

## 2017-08-23 DIAGNOSIS — I130 Hypertensive heart and chronic kidney disease with heart failure and stage 1 through stage 4 chronic kidney disease, or unspecified chronic kidney disease: Secondary | ICD-10-CM | POA: Diagnosis not present

## 2017-08-23 DIAGNOSIS — M150 Primary generalized (osteo)arthritis: Secondary | ICD-10-CM | POA: Diagnosis not present

## 2017-08-23 DIAGNOSIS — E1122 Type 2 diabetes mellitus with diabetic chronic kidney disease: Secondary | ICD-10-CM | POA: Diagnosis not present

## 2017-08-23 DIAGNOSIS — I482 Chronic atrial fibrillation: Secondary | ICD-10-CM | POA: Diagnosis not present

## 2017-08-23 DIAGNOSIS — T84030D Mechanical loosening of internal right hip prosthetic joint, subsequent encounter: Secondary | ICD-10-CM | POA: Diagnosis not present

## 2017-08-23 DIAGNOSIS — J449 Chronic obstructive pulmonary disease, unspecified: Secondary | ICD-10-CM | POA: Diagnosis not present

## 2017-08-24 ENCOUNTER — Other Ambulatory Visit: Payer: Self-pay | Admitting: Internal Medicine

## 2017-08-25 ENCOUNTER — Ambulatory Visit: Payer: Self-pay | Admitting: *Deleted

## 2017-08-25 DIAGNOSIS — T84030D Mechanical loosening of internal right hip prosthetic joint, subsequent encounter: Secondary | ICD-10-CM | POA: Diagnosis not present

## 2017-08-25 DIAGNOSIS — J449 Chronic obstructive pulmonary disease, unspecified: Secondary | ICD-10-CM | POA: Diagnosis not present

## 2017-08-25 DIAGNOSIS — I482 Chronic atrial fibrillation: Secondary | ICD-10-CM | POA: Diagnosis not present

## 2017-08-25 DIAGNOSIS — E1122 Type 2 diabetes mellitus with diabetic chronic kidney disease: Secondary | ICD-10-CM | POA: Diagnosis not present

## 2017-08-25 DIAGNOSIS — I130 Hypertensive heart and chronic kidney disease with heart failure and stage 1 through stage 4 chronic kidney disease, or unspecified chronic kidney disease: Secondary | ICD-10-CM | POA: Diagnosis not present

## 2017-08-25 DIAGNOSIS — M150 Primary generalized (osteo)arthritis: Secondary | ICD-10-CM | POA: Diagnosis not present

## 2017-08-25 NOTE — Telephone Encounter (Signed)
Pt with home health P.T. 'Wanda Brooks"  Reports pt's BP 175/70, after few minutes 170/70. Pt reports took all medications this AM. Pt present during call.  P.T. States BP usually runs 140's/60's.  Pt denies headache, no other symptoms. Pt states mild CP this am at 0800 "But laid down and went away... It was gas." Did not reoccur. Pt's daughter Wanda Brooks will call to schedule appt within 3 days per protocol. Care advise given per protocol.P.T.with patient. Reason for Disposition . Systolic BP  >= 893 OR Diastolic >= 734  Answer Assessment - Initial Assessment Questions 1. BLOOD PRESSURE: "What is the blood pressure?" "Did you take at least two measurements 5 minutes apart?"     175/70...170/70 2. ONSET: "When did you take your blood pressure?"     1145 with therapy 3. HOW: "How did you obtain the blood pressure?" (e.g., visiting nurse, automatic home BP monitor)     P.T.s monitor 4. HISTORY: "Do you have a history of high blood pressure?"     yes 5. MEDICATIONS: "Are you taking any medications for blood pressure?" "Have you missed any doses recently?"     no 6. OTHER SYMPTOMS: "Do you have any symptoms?" (e.g., headache, chest pain, blurred vision, difficulty breathing, weakness)     Mild CP this am, brief, resolved with rest "It was gas."  Protocols used: HIGH BLOOD PRESSURE-A-AH

## 2017-08-30 ENCOUNTER — Telehealth: Payer: Self-pay

## 2017-08-30 DIAGNOSIS — T84030D Mechanical loosening of internal right hip prosthetic joint, subsequent encounter: Secondary | ICD-10-CM | POA: Diagnosis not present

## 2017-08-30 DIAGNOSIS — E1122 Type 2 diabetes mellitus with diabetic chronic kidney disease: Secondary | ICD-10-CM | POA: Diagnosis not present

## 2017-08-30 DIAGNOSIS — M150 Primary generalized (osteo)arthritis: Secondary | ICD-10-CM | POA: Diagnosis not present

## 2017-08-30 DIAGNOSIS — I482 Chronic atrial fibrillation: Secondary | ICD-10-CM | POA: Diagnosis not present

## 2017-08-30 DIAGNOSIS — J449 Chronic obstructive pulmonary disease, unspecified: Secondary | ICD-10-CM | POA: Diagnosis not present

## 2017-08-30 DIAGNOSIS — I130 Hypertensive heart and chronic kidney disease with heart failure and stage 1 through stage 4 chronic kidney disease, or unspecified chronic kidney disease: Secondary | ICD-10-CM | POA: Diagnosis not present

## 2017-08-30 NOTE — Telephone Encounter (Signed)
Reviewed with Mrs. Isley, systolic 748. F/u prn

## 2017-08-30 NOTE — Telephone Encounter (Addendum)
Copied from [place] [phone]. Topic: Quick Communication - Office Called Patient >> Aug 30, 2017  1:36 PM Synthia Innocent wrote: Reason for CRM: returning call >> Aug 30, 2017  2:56 PM [place], Tera Partridge, CMA wrote: I have not called pt >> Aug 30, 2017  3:22 PM Helene Shoe, LPN wrote: I spoke with [name] and she said pts BP is back to her normal now with the systolic being 483. Pt has not had anymore chest pain. [name] said she thought when PT [place] came out the other day it was early and pt had not been up long and had not taken med for BP very long before BP taken and was high. [name] declined to make appt now but said she would cb if needed. Avie Echevaria NP is out of office today and will send note to provider in office to review.

## 2017-08-31 ENCOUNTER — Other Ambulatory Visit: Payer: Self-pay | Admitting: Internal Medicine

## 2017-08-31 DIAGNOSIS — J449 Chronic obstructive pulmonary disease, unspecified: Secondary | ICD-10-CM | POA: Diagnosis not present

## 2017-08-31 DIAGNOSIS — M150 Primary generalized (osteo)arthritis: Secondary | ICD-10-CM | POA: Diagnosis not present

## 2017-08-31 DIAGNOSIS — I482 Chronic atrial fibrillation: Secondary | ICD-10-CM | POA: Diagnosis not present

## 2017-08-31 DIAGNOSIS — T84030D Mechanical loosening of internal right hip prosthetic joint, subsequent encounter: Secondary | ICD-10-CM | POA: Diagnosis not present

## 2017-08-31 DIAGNOSIS — E1122 Type 2 diabetes mellitus with diabetic chronic kidney disease: Secondary | ICD-10-CM | POA: Diagnosis not present

## 2017-08-31 DIAGNOSIS — I130 Hypertensive heart and chronic kidney disease with heart failure and stage 1 through stage 4 chronic kidney disease, or unspecified chronic kidney disease: Secondary | ICD-10-CM | POA: Diagnosis not present

## 2017-09-06 DIAGNOSIS — I482 Chronic atrial fibrillation: Secondary | ICD-10-CM | POA: Diagnosis not present

## 2017-09-06 DIAGNOSIS — M150 Primary generalized (osteo)arthritis: Secondary | ICD-10-CM | POA: Diagnosis not present

## 2017-09-06 DIAGNOSIS — J449 Chronic obstructive pulmonary disease, unspecified: Secondary | ICD-10-CM | POA: Diagnosis not present

## 2017-09-06 DIAGNOSIS — T84030D Mechanical loosening of internal right hip prosthetic joint, subsequent encounter: Secondary | ICD-10-CM | POA: Diagnosis not present

## 2017-09-06 DIAGNOSIS — I130 Hypertensive heart and chronic kidney disease with heart failure and stage 1 through stage 4 chronic kidney disease, or unspecified chronic kidney disease: Secondary | ICD-10-CM | POA: Diagnosis not present

## 2017-09-06 DIAGNOSIS — E1122 Type 2 diabetes mellitus with diabetic chronic kidney disease: Secondary | ICD-10-CM | POA: Diagnosis not present

## 2017-09-09 DIAGNOSIS — J449 Chronic obstructive pulmonary disease, unspecified: Secondary | ICD-10-CM | POA: Diagnosis not present

## 2017-09-09 DIAGNOSIS — I482 Chronic atrial fibrillation: Secondary | ICD-10-CM | POA: Diagnosis not present

## 2017-09-09 DIAGNOSIS — E1122 Type 2 diabetes mellitus with diabetic chronic kidney disease: Secondary | ICD-10-CM | POA: Diagnosis not present

## 2017-09-09 DIAGNOSIS — T84030D Mechanical loosening of internal right hip prosthetic joint, subsequent encounter: Secondary | ICD-10-CM | POA: Diagnosis not present

## 2017-09-09 DIAGNOSIS — I130 Hypertensive heart and chronic kidney disease with heart failure and stage 1 through stage 4 chronic kidney disease, or unspecified chronic kidney disease: Secondary | ICD-10-CM | POA: Diagnosis not present

## 2017-09-09 DIAGNOSIS — M150 Primary generalized (osteo)arthritis: Secondary | ICD-10-CM | POA: Diagnosis not present

## 2017-09-10 ENCOUNTER — Other Ambulatory Visit: Payer: Self-pay | Admitting: Internal Medicine

## 2017-09-10 NOTE — Telephone Encounter (Signed)
LOV  03/25/17 Wanda Brooks Provider's name not associated with medication.

## 2017-09-10 NOTE — Telephone Encounter (Signed)
Copied from [place] [phone]. Topic: Quick Communication - Rx Refill/Question >> Sep 10, 2017  1:53 PM Synthia Innocent wrote: Medication: HYDROcodone-acetaminophen ([place]) 7.5-325 MG tablet  Has the patient contacted their pharmacy? No. (Agent: If no, request that the patient contact the pharmacy for the refill.) (Agent: If yes, when and what did the pharmacy advise?)  Preferred Pharmacy (with phone number or street name):[facility] in [place]: Please be advised that RX refills may take up to 3 business days. We ask that you follow-up with your pharmacy.

## 2017-09-13 DIAGNOSIS — I130 Hypertensive heart and chronic kidney disease with heart failure and stage 1 through stage 4 chronic kidney disease, or unspecified chronic kidney disease: Secondary | ICD-10-CM | POA: Diagnosis not present

## 2017-09-13 DIAGNOSIS — J449 Chronic obstructive pulmonary disease, unspecified: Secondary | ICD-10-CM | POA: Diagnosis not present

## 2017-09-13 DIAGNOSIS — E1122 Type 2 diabetes mellitus with diabetic chronic kidney disease: Secondary | ICD-10-CM | POA: Diagnosis not present

## 2017-09-13 DIAGNOSIS — T84030D Mechanical loosening of internal right hip prosthetic joint, subsequent encounter: Secondary | ICD-10-CM | POA: Diagnosis not present

## 2017-09-13 DIAGNOSIS — M150 Primary generalized (osteo)arthritis: Secondary | ICD-10-CM | POA: Diagnosis not present

## 2017-09-13 DIAGNOSIS — I482 Chronic atrial fibrillation: Secondary | ICD-10-CM | POA: Diagnosis not present

## 2017-09-14 ENCOUNTER — Encounter: Payer: Medicare Other | Admitting: Internal Medicine

## 2017-09-14 ENCOUNTER — Ambulatory Visit: Payer: Self-pay | Admitting: *Deleted

## 2017-09-14 MED ORDER — HYDROCODONE-ACETAMINOPHEN 7.5-325 MG PO TABS: 1.0000 | ORAL_TABLET | Freq: Two times a day (BID) | ORAL | 0 refills | Status: DC | PRN

## 2017-09-14 NOTE — Telephone Encounter (Signed)
Pt's daughter [name]' called to report pt had CP at 0500 and SOB. States SOB is more so than usual, CP resolved.Daughter is not with pt, states NT cannot call and speak with her as she is very HOH. Daughter made aware NT unable to do assessment, pt may need to go to ED given symptoms. Daughter states she is an Therapist, sports and would not have left her mother alone if she needed to go to ED. States this is an ongoing problem, just a little increased and would like appt to have pt "checked out." Appt made with Glenda Chroman for tomorrow AM, earliest available.  Daughter instructed to take pt to ED if symptoms worsen. States will do so. Reason for Disposition . [1] Longstanding difficulty breathing (e.g., CHF, COPD, emphysema) AND [2] WORSE than normal  Protocols used: BREATHING DIFFICULTY-A-AH

## 2017-09-15 ENCOUNTER — Ambulatory Visit (INDEPENDENT_AMBULATORY_CARE_PROVIDER_SITE_OTHER): Payer: Medicare Other | Admitting: Family Medicine

## 2017-09-15 ENCOUNTER — Ambulatory Visit (INDEPENDENT_AMBULATORY_CARE_PROVIDER_SITE_OTHER)
Admission: RE | Admit: 2017-09-15 | Discharge: 2017-09-15 | Disposition: A | Discharge status: Home / Self Care | Payer: Medicare Other | Source: Ambulatory Visit | Attending: Family Medicine | Admitting: Family Medicine

## 2017-09-15 ENCOUNTER — Encounter: Payer: Self-pay | Admitting: Family Medicine

## 2017-09-15 VITALS — BP 166/60 | HR 80 | Temp 98.3°F | Ht 65.0 in | Wt 153.0 lb

## 2017-09-15 DIAGNOSIS — I5032 Chronic diastolic (congestive) heart failure: Secondary | ICD-10-CM

## 2017-09-15 DIAGNOSIS — J449 Chronic obstructive pulmonary disease, unspecified: Secondary | ICD-10-CM

## 2017-09-15 DIAGNOSIS — R0602 Shortness of breath: Secondary | ICD-10-CM | POA: Diagnosis not present

## 2017-09-15 LAB — CBC WITH DIFFERENTIAL/PLATELET
Basophils Absolute: 0.1 10*3/uL (ref 0.0–0.1)
Basophils Relative: 1.1 % (ref 0.0–3.0)
EOS ABS: 0.1 10*3/uL (ref 0.0–0.7)
Eosinophils Relative: 0.7 % (ref 0.0–5.0)
HCT: 40.2 % (ref 36.0–46.0)
Hemoglobin: 13.1 g/dL (ref 12.0–15.0)
LYMPHS ABS: 2.5 10*3/uL (ref 0.7–4.0)
Lymphocytes Relative: 20.7 % (ref 12.0–46.0)
MCHC: 32.7 g/dL (ref 30.0–36.0)
MCV: 95 fl (ref 78.0–100.0)
Monocytes Absolute: 0.9 10*3/uL (ref 0.1–1.0)
Monocytes Relative: 7.7 % (ref 3.0–12.0)
NEUTROS PCT: 69.8 % (ref 43.0–77.0)
Neutro Abs: 8.5 10*3/uL — ABNORMAL HIGH (ref 1.4–7.7)
Platelets: 299 10*3/uL (ref 150.0–400.0)
RBC: 4.23 Mil/uL (ref 3.87–5.11)
RDW: 17.2 % — ABNORMAL HIGH (ref 11.5–15.5)
WBC: 12.1 10*3/uL — ABNORMAL HIGH (ref 4.0–10.5)

## 2017-09-15 LAB — BASIC METABOLIC PANEL
BUN: 30 mg/dL — ABNORMAL HIGH (ref 6–23)
CALCIUM: 9.7 mg/dL (ref 8.4–10.5)
CO2: 29 meq/L (ref 19–32)
Chloride: 103 mEq/L (ref 96–112)
Creatinine, Ser: 1.8 mg/dL — ABNORMAL HIGH (ref 0.40–1.20)
GFR: 28.78 mL/min — ABNORMAL LOW (ref >60.00)
Glucose, Bld: 133 mg/dL — ABNORMAL HIGH (ref 70–99)
POTASSIUM: 4.3 meq/L (ref 3.5–5.1)
SODIUM: 142 meq/L (ref 135–145)

## 2017-09-15 LAB — BRAIN NATRIURETIC PEPTIDE: PRO B NATRI PEPTIDE: 1254 pg/mL — AB (ref 0.0–100.0)
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
## 2017-09-15 NOTE — Patient Instructions (Signed)
Good to see you today  Please got to lab and xray for a chest xray- will call you about results  Please stop at desk to get appointment with cardiology

## 2017-09-16 ENCOUNTER — Other Ambulatory Visit: Payer: Self-pay | Admitting: Internal Medicine

## 2017-09-16 DIAGNOSIS — T84030D Mechanical loosening of internal right hip prosthetic joint, subsequent encounter: Secondary | ICD-10-CM | POA: Diagnosis not present

## 2017-09-16 DIAGNOSIS — M150 Primary generalized (osteo)arthritis: Secondary | ICD-10-CM | POA: Diagnosis not present

## 2017-09-16 DIAGNOSIS — J449 Chronic obstructive pulmonary disease, unspecified: Secondary | ICD-10-CM | POA: Diagnosis not present

## 2017-09-16 DIAGNOSIS — E1122 Type 2 diabetes mellitus with diabetic chronic kidney disease: Secondary | ICD-10-CM | POA: Diagnosis not present

## 2017-09-16 DIAGNOSIS — I130 Hypertensive heart and chronic kidney disease with heart failure and stage 1 through stage 4 chronic kidney disease, or unspecified chronic kidney disease: Secondary | ICD-10-CM | POA: Diagnosis not present

## 2017-09-16 DIAGNOSIS — I482 Chronic atrial fibrillation: Secondary | ICD-10-CM | POA: Diagnosis not present

## 2017-09-20 ENCOUNTER — Encounter: Payer: Medicare Other | Admitting: *Deleted

## 2017-09-20 ENCOUNTER — Telehealth: Payer: Self-pay

## 2017-09-20 NOTE — Telephone Encounter (Signed)
LMOVM reminding pt to send remote transmission.   

## 2017-09-21 ENCOUNTER — Ambulatory Visit (INDEPENDENT_AMBULATORY_CARE_PROVIDER_SITE_OTHER): Payer: Medicare Other | Admitting: Internal Medicine

## 2017-09-21 ENCOUNTER — Other Ambulatory Visit: Payer: Self-pay | Admitting: Internal Medicine

## 2017-09-21 ENCOUNTER — Encounter: Payer: Self-pay | Admitting: Internal Medicine

## 2017-09-21 VITALS — BP 142/64 | HR 64 | Ht 65.0 in | Wt 153.0 lb

## 2017-09-21 DIAGNOSIS — I481 Persistent atrial fibrillation: Secondary | ICD-10-CM | POA: Diagnosis not present

## 2017-09-21 DIAGNOSIS — I5032 Chronic diastolic (congestive) heart failure: Secondary | ICD-10-CM

## 2017-09-21 DIAGNOSIS — I4819 Other persistent atrial fibrillation: Secondary | ICD-10-CM

## 2017-09-21 DIAGNOSIS — Z950 Presence of cardiac pacemaker: Secondary | ICD-10-CM | POA: Diagnosis not present

## 2017-09-21 DIAGNOSIS — R001 Bradycardia, unspecified: Secondary | ICD-10-CM

## 2017-09-21 LAB — CUP PACEART INCLINIC DEVICE CHECK
Battery Remaining Longevity: 6 mo
Brady Statistic AP VP Percent: 85 %
Brady Statistic AP VS Percent: 0 %
Brady Statistic AS VP Percent: 15 %
Brady Statistic AS VS Percent: 0 %
Implantable Lead Implant Date: 20130529
Implantable Lead Implant Date: 20130529
Implantable Lead Location: 753860
Implantable Pulse Generator Implant Date: 20130529
Lead Channel Impedance Value: 372 Ohm
Lead Channel Impedance Value: 397 Ohm
Lead Channel Pacing Threshold Amplitude: 1.5 V
Lead Channel Sensing Intrinsic Amplitude: 1.4 mV
Lead Channel Setting Pacing Amplitude: 4 V
MDC IDC LEAD LOCATION: 753859
MDC IDC MSMT BATTERY IMPEDANCE: 2295 Ohm
MDC IDC MSMT BATTERY VOLTAGE: 2.67 V
MDC IDC MSMT LEADCHNL RV PACING THRESHOLD PULSEWIDTH: 1.25 ms
MDC IDC SESS DTM: 20190723150224
MDC IDC SET LEADCHNL RA PACING AMPLITUDE: 1.5 V
MDC IDC SET LEADCHNL RV PACING PULSEWIDTH: 1.25 ms
MDC IDC SET LEADCHNL RV SENSING SENSITIVITY: 4 mV

## 2017-09-21 MED ORDER — AMLODIPINE BESYLATE 10 MG PO TABS: 10.0000 mg | ORAL_TABLET | Freq: Every day | ORAL | 3 refills | Status: DC

## 2017-09-21 MED ORDER — FUROSEMIDE 40 MG PO TABS: ORAL_TABLET | ORAL | 3 refills | Status: DC

## 2017-09-21 NOTE — Patient Instructions (Signed)
Medication Instructions: - Your physician has recommended you make the following change in your medication:   1) Change furosemide to 40 mg -take 1 tablet by mouth every other day  Labwork: - Your physician recommends that you have lab work today: BNP  Procedures/Testing: - Your physician has recommended that you have a Cardioversion (DCCV). Electrical Cardioversion uses a jolt of electricity to your heart either through paddles or wired patches attached to your chest. This is a controlled, usually prescheduled, procedure. Defibrillation is done under light anesthesia in the hospital, and you usually go home the day of the procedure. This is done to get your heart back into a normal rhythm. You are not awake for the procedure.   You are scheduled for a Cardioversion on Tuesday 09/28/17 with Dr. Saunders Revel. Please arrive at the [facility] at 6:30 a.m. on the day of your procedure.  DIET INSTRUCTIONS:  Nothing to eat or drink after midnight except your medications with a              sip of water.         1) Labs: n/a  2) Medications:  YOU MAY TAKE ALL of your medications with a small amount of water the morning your procedure unless listed below:  - hold lasix (furosemide) the morning of your procedure  3) Must have a responsible person to drive you home.  4) Bring a current list of your medications and current insurance cards.    If you have any questions after you get home, please call the office at [phone]   - Your physician has requested that you have an echocardiogram in 3 weeks. Echocardiography is a painless test that uses sound waves to create images of your heart. It provides your doctor with information about the size and shape of your heart and how well your heart's chambers and valves are working. This procedure takes approximately one hour. There are no restrictions for this procedure.  Follow-Up: - Your physician recommends that you schedule a follow-up  appointment in: 4 weeks with Dr. Caryl Comes (PPM- Medtronic)   Any Additional Special Instructions Will Be Listed Below (If Applicable).     If you need a refill on your cardiac medications before your next appointment, please call your pharmacy.
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
## 2017-09-22 ENCOUNTER — Encounter: Payer: Self-pay | Admitting: Cardiology

## 2017-09-22 LAB — BRAIN NATRIURETIC PEPTIDE: BNP: 351.8 pg/mL — AB (ref 0.0–100.0)

## 2017-09-28 ENCOUNTER — Other Ambulatory Visit: Payer: Self-pay | Admitting: Internal Medicine

## 2017-09-28 ENCOUNTER — Telehealth: Payer: Self-pay | Admitting: Internal Medicine

## 2017-09-28 MED ORDER — PROPOFOL 10 MG/ML IV BOLUS
INTRAVENOUS | Status: AC
  Filled 2017-09-28: qty 20

## 2017-09-28 NOTE — Telephone Encounter (Signed)
Pt daughter calling stating she and patient mistook the dates for the cardioversion  She is very apologetic and states she would like to reschedule  Please call back to reschedule

## 2017-09-28 NOTE — Telephone Encounter (Signed)
I spoke with [name]. She got her dates mixed up for the patient's DCCV for today. We have rescheduled her for Monday 10/04/17 at 8:00 am with Dr. Fletcher Anon.  She is aware to have the patient arrive at 7:00 am at the [facility] and that all previous pre procedure instructions will apply.  [name] voices understanding.

## 2017-09-29 ENCOUNTER — Other Ambulatory Visit: Payer: Self-pay | Admitting: Internal Medicine

## 2017-09-29 ENCOUNTER — Other Ambulatory Visit: Payer: Self-pay

## 2017-09-29 DIAGNOSIS — Z96641 Presence of right artificial hip joint: Secondary | ICD-10-CM | POA: Diagnosis not present

## 2017-09-29 DIAGNOSIS — Z471 Aftercare following joint replacement surgery: Secondary | ICD-10-CM | POA: Diagnosis not present

## 2017-09-29 NOTE — Telephone Encounter (Signed)
Please advise if okay for pt to continue medication.... I saw were there was a dose change and discontinue at discharge

## 2017-09-30 NOTE — Telephone Encounter (Signed)
Looks like you have filled prior to rehab  Name of Medication: Lorazepam 0.5mg  Name of Pharmacy: [facility] or [facility] Date and Quantity: 07/19/17  #28, maybe filled by rehab Last Office Visit and Type: 08/2017 Acute Next Office Visit and Type: none

## 2017-10-04 ENCOUNTER — Encounter
Admission: RE | Disposition: A | Discharge status: Home / Self Care | Payer: Self-pay | Source: Ambulatory Visit | Attending: Cardiovascular Disease

## 2017-10-04 ENCOUNTER — Ambulatory Visit: Payer: Medicare Other | Admitting: Anesthesiology

## 2017-10-04 ENCOUNTER — Ambulatory Visit
Admission: RE | Admit: 2017-10-04 | Discharge: 2017-10-04 | Disposition: A | Discharge status: Home / Self Care | Payer: Medicare Other | Source: Ambulatory Visit | Attending: Cardiovascular Disease | Admitting: Cardiovascular Disease

## 2017-10-04 ENCOUNTER — Encounter: Payer: Self-pay | Admitting: *Deleted

## 2017-10-04 DIAGNOSIS — Z886 Allergy status to analgesic agent status: Secondary | ICD-10-CM | POA: Insufficient documentation

## 2017-10-04 DIAGNOSIS — I2510 Atherosclerotic heart disease of native coronary artery without angina pectoris: Secondary | ICD-10-CM | POA: Insufficient documentation

## 2017-10-04 DIAGNOSIS — Z96641 Presence of right artificial hip joint: Secondary | ICD-10-CM | POA: Insufficient documentation

## 2017-10-04 DIAGNOSIS — I5032 Chronic diastolic (congestive) heart failure: Secondary | ICD-10-CM | POA: Insufficient documentation

## 2017-10-04 DIAGNOSIS — E1122 Type 2 diabetes mellitus with diabetic chronic kidney disease: Secondary | ICD-10-CM | POA: Diagnosis not present

## 2017-10-04 DIAGNOSIS — F17200 Nicotine dependence, unspecified, uncomplicated: Secondary | ICD-10-CM | POA: Diagnosis not present

## 2017-10-04 DIAGNOSIS — K219 Gastro-esophageal reflux disease without esophagitis: Secondary | ICD-10-CM | POA: Insufficient documentation

## 2017-10-04 DIAGNOSIS — I4819 Other persistent atrial fibrillation: Secondary | ICD-10-CM

## 2017-10-04 DIAGNOSIS — Z951 Presence of aortocoronary bypass graft: Secondary | ICD-10-CM | POA: Diagnosis not present

## 2017-10-04 DIAGNOSIS — Z950 Presence of cardiac pacemaker: Secondary | ICD-10-CM | POA: Diagnosis not present

## 2017-10-04 DIAGNOSIS — Z888 Allergy status to other drugs, medicaments and biological substances status: Secondary | ICD-10-CM | POA: Insufficient documentation

## 2017-10-04 DIAGNOSIS — J449 Chronic obstructive pulmonary disease, unspecified: Secondary | ICD-10-CM | POA: Diagnosis not present

## 2017-10-04 DIAGNOSIS — Z79899 Other long term (current) drug therapy: Secondary | ICD-10-CM | POA: Insufficient documentation

## 2017-10-04 DIAGNOSIS — I481 Persistent atrial fibrillation: Secondary | ICD-10-CM

## 2017-10-04 DIAGNOSIS — M1990 Unspecified osteoarthritis, unspecified site: Secondary | ICD-10-CM | POA: Insufficient documentation

## 2017-10-04 DIAGNOSIS — I4891 Unspecified atrial fibrillation: Secondary | ICD-10-CM | POA: Diagnosis not present

## 2017-10-04 DIAGNOSIS — Z7901 Long term (current) use of anticoagulants: Secondary | ICD-10-CM | POA: Insufficient documentation

## 2017-10-04 DIAGNOSIS — N183 Chronic kidney disease, stage 3 (moderate): Secondary | ICD-10-CM | POA: Diagnosis not present

## 2017-10-04 DIAGNOSIS — I130 Hypertensive heart and chronic kidney disease with heart failure and stage 1 through stage 4 chronic kidney disease, or unspecified chronic kidney disease: Secondary | ICD-10-CM | POA: Diagnosis not present

## 2017-10-04 DIAGNOSIS — E785 Hyperlipidemia, unspecified: Secondary | ICD-10-CM | POA: Insufficient documentation

## 2017-10-04 HISTORY — PX: CARDIOVERSION: EP1203

## 2017-10-04 SURGERY — CARDIOVERSION (CATH LAB)
Anesthesia: General

## 2017-10-04 MED ORDER — PROPOFOL 10 MG/ML IV BOLUS
INTRAVENOUS | Status: DC | PRN
  Administered 2017-10-04: 40 mg via INTRAVENOUS

## 2017-10-04 MED ORDER — PROPOFOL 10 MG/ML IV BOLUS
INTRAVENOUS | Status: AC
  Filled 2017-10-04: qty 20

## 2017-10-04 MED ORDER — SODIUM CHLORIDE 0.9 % IV SOLN
INTRAVENOUS | Status: DC | PRN
  Administered 2017-10-04: 08:00:00 via INTRAVENOUS

## 2017-10-04 NOTE — Interval H&P Note (Signed)
History and Physical Interval Note:  10/04/2017 8:05 AM  Wanda Brooks  has presented today for surgery, with the diagnosis of Cardioversion    Afib OK 2nd Cardioversion per [facility] time 8a  The various methods of treatment have been discussed with the patient and family. After consideration of risks, benefits and other options for treatment, the patient has consented to  Procedure(s): CARDIOVERSION (N/A) as a surgical intervention .  The patient's history has been reviewed, patient examined, no change in status, stable for surgery.  I have reviewed the patient's chart and labs.  Questions were answered to the patient's satisfaction.     [name]

## 2017-10-04 NOTE — Transfer of Care (Signed)
Immediate Anesthesia Transfer of Care Note  Patient: Wanda Brooks  Procedure(s) Performed: CARDIOVERSION (N/A )  Patient Location: Short Stay  Anesthesia Type:General  Level of Consciousness: awake  Airway & Oxygen Therapy: Patient connected to nasal cannula oxygen  Post-op Assessment: Post -op Vital signs reviewed and stable  Post vital signs: stable  Last Vitals:  Vitals Value Taken Time  BP 134/68 10/04/2017  7:54 AM  Temp    Pulse 80 10/04/2017  7:54 AM  Resp 26 10/04/2017  7:54 AM  SpO2 95 % 10/04/2017  7:54 AM  Vitals shown include unvalidated device data.  Last Pain:  Vitals:   10/04/17 0713  TempSrc: Oral  PainSc: 0-No pain         Complications: No apparent anesthesia complications

## 2017-10-04 NOTE — CV Procedure (Signed)
Cardioversion note: A standard informed consent was obtained. Timeout was performed. The pads were placed in the anterior posterior fashion. The patient was given propofol by the anesthesia team.  Successful cardioversion was performed with a 150 J. The patient converted to sinus/paced rhythm. Pre-and post EKGs were reviewed. The patient tolerated the procedure with no immediate complications.  Recommendations: Continue same medications and follow-up in 2-3 weeks.
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
## 2017-10-04 NOTE — Anesthesia Postprocedure Evaluation (Signed)
Anesthesia Post Note  Patient: Wanda Brooks  Procedure(s) Performed: CARDIOVERSION (N/A )  Patient location during evaluation: PACU Anesthesia Type: General Level of consciousness: awake and alert Pain management: pain level controlled Vital Signs Assessment: post-procedure vital signs reviewed and stable Respiratory status: spontaneous breathing, nonlabored ventilation, respiratory function stable and patient connected to nasal cannula oxygen Cardiovascular status: blood pressure returned to baseline and stable Postop Assessment: no apparent nausea or vomiting Anesthetic complications: no     Last Vitals:  Vitals:   10/04/17 0815 10/04/17 0836  BP: (!) 154/58 (!) 158/61  Pulse: 60 71  Resp: (!) 21 (!) 21  Temp:    SpO2: 96% 95%    Last Pain:  Vitals:   10/04/17 0836  TempSrc:   PainSc: 0-No pain                 Molli Barrows

## 2017-10-04 NOTE — Anesthesia Post-op Follow-up Note (Signed)
Anesthesia QCDR form completed.        

## 2017-10-05 ENCOUNTER — Encounter (HOSPITAL_COMMUNITY): Payer: Medicare Other | Admitting: Cardiology

## 2017-10-09 ENCOUNTER — Other Ambulatory Visit: Payer: Self-pay | Admitting: Internal Medicine

## 2017-10-13 ENCOUNTER — Ambulatory Visit (INDEPENDENT_AMBULATORY_CARE_PROVIDER_SITE_OTHER): Payer: Medicare Other

## 2017-10-13 ENCOUNTER — Other Ambulatory Visit: Payer: Self-pay

## 2017-10-13 DIAGNOSIS — I481 Persistent atrial fibrillation: Secondary | ICD-10-CM | POA: Diagnosis not present

## 2017-10-13 DIAGNOSIS — I4819 Other persistent atrial fibrillation: Secondary | ICD-10-CM

## 2017-10-14 ENCOUNTER — Ambulatory Visit: Payer: Medicare Other | Admitting: Internal Medicine

## 2017-10-19 ENCOUNTER — Encounter: Payer: Medicare Other | Admitting: Internal Medicine

## 2017-10-21 ENCOUNTER — Ambulatory Visit (HOSPITAL_COMMUNITY)
Admission: RE | Admit: 2017-10-21 | Discharge: 2017-10-21 | Disposition: A | Discharge status: Home / Self Care | Payer: Medicare Other | Source: Ambulatory Visit | Attending: Internal Medicine | Admitting: Internal Medicine

## 2017-10-21 VITALS — BP 168/84 | HR 78 | Wt 146.0 lb

## 2017-10-21 DIAGNOSIS — N184 Chronic kidney disease, stage 4 (severe): Secondary | ICD-10-CM | POA: Diagnosis not present

## 2017-10-21 DIAGNOSIS — Z800 Family history of malignant neoplasm of digestive organs: Secondary | ICD-10-CM | POA: Insufficient documentation

## 2017-10-21 DIAGNOSIS — F17210 Nicotine dependence, cigarettes, uncomplicated: Secondary | ICD-10-CM | POA: Diagnosis not present

## 2017-10-21 DIAGNOSIS — Z823 Family history of stroke: Secondary | ICD-10-CM | POA: Insufficient documentation

## 2017-10-21 DIAGNOSIS — K219 Gastro-esophageal reflux disease without esophagitis: Secondary | ICD-10-CM | POA: Insufficient documentation

## 2017-10-21 DIAGNOSIS — I482 Chronic atrial fibrillation, unspecified: Secondary | ICD-10-CM

## 2017-10-21 DIAGNOSIS — N183 Chronic kidney disease, stage 3 unspecified: Secondary | ICD-10-CM

## 2017-10-21 DIAGNOSIS — I130 Hypertensive heart and chronic kidney disease with heart failure and stage 1 through stage 4 chronic kidney disease, or unspecified chronic kidney disease: Secondary | ICD-10-CM | POA: Insufficient documentation

## 2017-10-21 DIAGNOSIS — Z79899 Other long term (current) drug therapy: Secondary | ICD-10-CM | POA: Insufficient documentation

## 2017-10-21 DIAGNOSIS — I5042 Chronic combined systolic (congestive) and diastolic (congestive) heart failure: Secondary | ICD-10-CM | POA: Insufficient documentation

## 2017-10-21 DIAGNOSIS — Z7901 Long term (current) use of anticoagulants: Secondary | ICD-10-CM | POA: Insufficient documentation

## 2017-10-21 DIAGNOSIS — Z888 Allergy status to other drugs, medicaments and biological substances status: Secondary | ICD-10-CM | POA: Diagnosis not present

## 2017-10-21 DIAGNOSIS — Z951 Presence of aortocoronary bypass graft: Secondary | ICD-10-CM | POA: Insufficient documentation

## 2017-10-21 DIAGNOSIS — J449 Chronic obstructive pulmonary disease, unspecified: Secondary | ICD-10-CM | POA: Insufficient documentation

## 2017-10-21 DIAGNOSIS — M1990 Unspecified osteoarthritis, unspecified site: Secondary | ICD-10-CM | POA: Diagnosis not present

## 2017-10-21 DIAGNOSIS — R001 Bradycardia, unspecified: Secondary | ICD-10-CM | POA: Insufficient documentation

## 2017-10-21 DIAGNOSIS — Z8349 Family history of other endocrine, nutritional and metabolic diseases: Secondary | ICD-10-CM | POA: Insufficient documentation

## 2017-10-21 DIAGNOSIS — I2583 Coronary atherosclerosis due to lipid rich plaque: Secondary | ICD-10-CM

## 2017-10-21 DIAGNOSIS — Z886 Allergy status to analgesic agent status: Secondary | ICD-10-CM | POA: Diagnosis not present

## 2017-10-21 DIAGNOSIS — Z841 Family history of disorders of kidney and ureter: Secondary | ICD-10-CM | POA: Insufficient documentation

## 2017-10-21 DIAGNOSIS — F419 Anxiety disorder, unspecified: Secondary | ICD-10-CM | POA: Insufficient documentation

## 2017-10-21 DIAGNOSIS — F329 Major depressive disorder, single episode, unspecified: Secondary | ICD-10-CM | POA: Diagnosis not present

## 2017-10-21 DIAGNOSIS — I2510 Atherosclerotic heart disease of native coronary artery without angina pectoris: Secondary | ICD-10-CM | POA: Insufficient documentation

## 2017-10-21 DIAGNOSIS — I480 Paroxysmal atrial fibrillation: Secondary | ICD-10-CM | POA: Diagnosis not present

## 2017-10-21 DIAGNOSIS — E1122 Type 2 diabetes mellitus with diabetic chronic kidney disease: Secondary | ICD-10-CM | POA: Diagnosis not present

## 2017-10-21 DIAGNOSIS — Z79891 Long term (current) use of opiate analgesic: Secondary | ICD-10-CM | POA: Insufficient documentation

## 2017-10-21 DIAGNOSIS — E785 Hyperlipidemia, unspecified: Secondary | ICD-10-CM | POA: Insufficient documentation

## 2017-10-21 DIAGNOSIS — Z833 Family history of diabetes mellitus: Secondary | ICD-10-CM | POA: Insufficient documentation

## 2017-10-21 DIAGNOSIS — Z8249 Family history of ischemic heart disease and other diseases of the circulatory system: Secondary | ICD-10-CM | POA: Diagnosis not present

## 2017-10-21 LAB — BASIC METABOLIC PANEL
ANION GAP: 11 (ref 5–15)
BUN: 38 mg/dL — ABNORMAL HIGH (ref 8–23)
CO2: 29 mmol/L (ref 22–32)
Calcium: 9.8 mg/dL (ref 8.9–10.3)
Chloride: 102 mmol/L (ref 98–111)
Creatinine, Ser: 1.82 mg/dL — ABNORMAL HIGH (ref 0.44–1.00)
GFR, EST AFRICAN AMERICAN: 29 mL/min — AB (ref >60)
GFR, EST NON AFRICAN AMERICAN: 25 mL/min — AB (ref >60)
Glucose, Bld: 130 mg/dL — ABNORMAL HIGH (ref 70–99)
POTASSIUM: 3.3 mmol/L — AB (ref 3.5–5.1)
SODIUM: 142 mmol/L (ref 135–145)

## 2017-10-21 MED ORDER — HYDRALAZINE HCL 25 MG PO TABS: 25.0000 mg | ORAL_TABLET | Freq: Three times a day (TID) | ORAL | 6 refills | Status: DC
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
## 2017-10-21 NOTE — Progress Notes (Signed)
Order for HHPT and vest protocol faxed to [facility] at [phone]

## 2017-10-21 NOTE — Patient Instructions (Signed)
Increase Hydralazine to 25 mg Three times a day   Labs done today, we will call you for abnormal labs only  You have been referred to [facility], home health, they will contact you   Your physician recommends that you schedule a follow-up appointment in: 2 months

## 2017-10-21 NOTE — Progress Notes (Signed)
ReDS Vest - 10/21/17 1000      ReDS Vest   Fitting Posture  Sitting    Height Marker  --   station A   Ruler Value  27    ReDS Value  33

## 2017-10-23 DIAGNOSIS — I2510 Atherosclerotic heart disease of native coronary artery without angina pectoris: Secondary | ICD-10-CM | POA: Diagnosis not present

## 2017-10-23 DIAGNOSIS — Z8701 Personal history of pneumonia (recurrent): Secondary | ICD-10-CM | POA: Diagnosis not present

## 2017-10-23 DIAGNOSIS — M810 Age-related osteoporosis without current pathological fracture: Secondary | ICD-10-CM | POA: Diagnosis not present

## 2017-10-23 DIAGNOSIS — Z950 Presence of cardiac pacemaker: Secondary | ICD-10-CM | POA: Diagnosis not present

## 2017-10-23 DIAGNOSIS — F17210 Nicotine dependence, cigarettes, uncomplicated: Secondary | ICD-10-CM | POA: Diagnosis not present

## 2017-10-23 DIAGNOSIS — E1122 Type 2 diabetes mellitus with diabetic chronic kidney disease: Secondary | ICD-10-CM | POA: Diagnosis not present

## 2017-10-23 DIAGNOSIS — I481 Persistent atrial fibrillation: Secondary | ICD-10-CM | POA: Diagnosis not present

## 2017-10-23 DIAGNOSIS — I5042 Chronic combined systolic (congestive) and diastolic (congestive) heart failure: Secondary | ICD-10-CM | POA: Diagnosis not present

## 2017-10-23 DIAGNOSIS — J449 Chronic obstructive pulmonary disease, unspecified: Secondary | ICD-10-CM | POA: Diagnosis not present

## 2017-10-23 DIAGNOSIS — Z7901 Long term (current) use of anticoagulants: Secondary | ICD-10-CM | POA: Diagnosis not present

## 2017-10-23 DIAGNOSIS — N183 Chronic kidney disease, stage 3 (moderate): Secondary | ICD-10-CM | POA: Diagnosis not present

## 2017-10-23 DIAGNOSIS — I130 Hypertensive heart and chronic kidney disease with heart failure and stage 1 through stage 4 chronic kidney disease, or unspecified chronic kidney disease: Secondary | ICD-10-CM | POA: Diagnosis not present

## 2017-10-24 ENCOUNTER — Other Ambulatory Visit: Payer: Self-pay | Admitting: Internal Medicine

## 2017-10-25 NOTE — Telephone Encounter (Signed)
Recent cardiology note reviewed, will refill Metoprolol

## 2017-10-25 NOTE — Telephone Encounter (Signed)
Last filled 04/2017... Please advise... Last OV 03/2017

## 2017-10-27 ENCOUNTER — Encounter: Payer: Medicare Other | Admitting: *Deleted

## 2017-10-27 ENCOUNTER — Telehealth: Payer: Self-pay

## 2017-10-27 NOTE — Telephone Encounter (Signed)
Confirmed remote transmission w/ pt daughter.   

## 2017-10-29 ENCOUNTER — Encounter: Payer: Self-pay | Admitting: Cardiology

## 2017-10-29 DIAGNOSIS — J449 Chronic obstructive pulmonary disease, unspecified: Secondary | ICD-10-CM | POA: Diagnosis not present

## 2017-10-29 DIAGNOSIS — N183 Chronic kidney disease, stage 3 (moderate): Secondary | ICD-10-CM | POA: Diagnosis not present

## 2017-10-29 DIAGNOSIS — E1122 Type 2 diabetes mellitus with diabetic chronic kidney disease: Secondary | ICD-10-CM | POA: Diagnosis not present

## 2017-10-29 DIAGNOSIS — I481 Persistent atrial fibrillation: Secondary | ICD-10-CM | POA: Diagnosis not present

## 2017-10-29 DIAGNOSIS — I130 Hypertensive heart and chronic kidney disease with heart failure and stage 1 through stage 4 chronic kidney disease, or unspecified chronic kidney disease: Secondary | ICD-10-CM | POA: Diagnosis not present

## 2017-10-29 DIAGNOSIS — I5042 Chronic combined systolic (congestive) and diastolic (congestive) heart failure: Secondary | ICD-10-CM | POA: Diagnosis not present

## 2017-11-01 ENCOUNTER — Other Ambulatory Visit: Payer: Self-pay | Admitting: Internal Medicine

## 2017-11-02 DIAGNOSIS — E1122 Type 2 diabetes mellitus with diabetic chronic kidney disease: Secondary | ICD-10-CM | POA: Diagnosis not present

## 2017-11-02 DIAGNOSIS — I481 Persistent atrial fibrillation: Secondary | ICD-10-CM | POA: Diagnosis not present

## 2017-11-02 DIAGNOSIS — N183 Chronic kidney disease, stage 3 (moderate): Secondary | ICD-10-CM | POA: Diagnosis not present

## 2017-11-02 DIAGNOSIS — J449 Chronic obstructive pulmonary disease, unspecified: Secondary | ICD-10-CM | POA: Diagnosis not present

## 2017-11-02 DIAGNOSIS — I130 Hypertensive heart and chronic kidney disease with heart failure and stage 1 through stage 4 chronic kidney disease, or unspecified chronic kidney disease: Secondary | ICD-10-CM | POA: Diagnosis not present

## 2017-11-02 DIAGNOSIS — I5042 Chronic combined systolic (congestive) and diastolic (congestive) heart failure: Secondary | ICD-10-CM | POA: Diagnosis not present

## 2017-11-04 DIAGNOSIS — N183 Chronic kidney disease, stage 3 (moderate): Secondary | ICD-10-CM | POA: Diagnosis not present

## 2017-11-04 DIAGNOSIS — I130 Hypertensive heart and chronic kidney disease with heart failure and stage 1 through stage 4 chronic kidney disease, or unspecified chronic kidney disease: Secondary | ICD-10-CM | POA: Diagnosis not present

## 2017-11-04 DIAGNOSIS — I5042 Chronic combined systolic (congestive) and diastolic (congestive) heart failure: Secondary | ICD-10-CM | POA: Diagnosis not present

## 2017-11-04 DIAGNOSIS — E1122 Type 2 diabetes mellitus with diabetic chronic kidney disease: Secondary | ICD-10-CM | POA: Diagnosis not present

## 2017-11-04 DIAGNOSIS — I481 Persistent atrial fibrillation: Secondary | ICD-10-CM | POA: Diagnosis not present

## 2017-11-04 DIAGNOSIS — J449 Chronic obstructive pulmonary disease, unspecified: Secondary | ICD-10-CM | POA: Diagnosis not present

## 2017-11-09 ENCOUNTER — Encounter: Payer: Self-pay | Admitting: *Deleted

## 2017-11-09 ENCOUNTER — Encounter: Payer: Self-pay | Admitting: Internal Medicine

## 2017-11-09 ENCOUNTER — Ambulatory Visit (INDEPENDENT_AMBULATORY_CARE_PROVIDER_SITE_OTHER): Payer: Medicare Other | Admitting: Internal Medicine

## 2017-11-09 ENCOUNTER — Encounter

## 2017-11-09 VITALS — BP 120/60 | HR 64 | Ht 65.0 in | Wt 149.5 lb

## 2017-11-09 DIAGNOSIS — I5042 Chronic combined systolic (congestive) and diastolic (congestive) heart failure: Secondary | ICD-10-CM | POA: Diagnosis not present

## 2017-11-09 DIAGNOSIS — R001 Bradycardia, unspecified: Secondary | ICD-10-CM | POA: Diagnosis not present

## 2017-11-09 DIAGNOSIS — J449 Chronic obstructive pulmonary disease, unspecified: Secondary | ICD-10-CM | POA: Diagnosis not present

## 2017-11-09 DIAGNOSIS — E1122 Type 2 diabetes mellitus with diabetic chronic kidney disease: Secondary | ICD-10-CM | POA: Diagnosis not present

## 2017-11-09 DIAGNOSIS — I2583 Coronary atherosclerosis due to lipid rich plaque: Secondary | ICD-10-CM | POA: Diagnosis not present

## 2017-11-09 DIAGNOSIS — I2510 Atherosclerotic heart disease of native coronary artery without angina pectoris: Secondary | ICD-10-CM

## 2017-11-09 DIAGNOSIS — I4819 Other persistent atrial fibrillation: Secondary | ICD-10-CM

## 2017-11-09 DIAGNOSIS — I130 Hypertensive heart and chronic kidney disease with heart failure and stage 1 through stage 4 chronic kidney disease, or unspecified chronic kidney disease: Secondary | ICD-10-CM | POA: Diagnosis not present

## 2017-11-09 DIAGNOSIS — I481 Persistent atrial fibrillation: Secondary | ICD-10-CM

## 2017-11-09 DIAGNOSIS — N183 Chronic kidney disease, stage 3 (moderate): Secondary | ICD-10-CM | POA: Diagnosis not present

## 2017-11-09 DIAGNOSIS — Z950 Presence of cardiac pacemaker: Secondary | ICD-10-CM | POA: Diagnosis not present

## 2017-11-09 DIAGNOSIS — Z01812 Encounter for preprocedural laboratory examination: Secondary | ICD-10-CM
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
## 2017-11-09 NOTE — Patient Instructions (Signed)
Medication Instructions: - Your physician recommends that you continue on your current medications as directed. Please refer to the Current Medication list given to you today.  Labwork: - Your physician recommends that you have lab work today: [employer]  - Your physician recommends that you return for lab work: between 11/24/17-12/06/17  Procedures/Testing: - Your physician has recommended that you have a pacemaker upgrade inserted. Please see the instruction sheet given to you today for more information.  Follow-Up: - Your physician recommends that you schedule a follow-up appointment: with the [facility] in 10-14 days (from 12/08/17) for a wound check  - Your physician recommends that you schedule a follow-up appointment in: 91 days (from 12/08/17) with Dr. Caryl Comes.   Any Additional Special Instructions Will Be Listed Below (If Applicable).     If you need a refill on your cardiac medications before your next appointment, please call your pharmacy.

## 2017-11-10 LAB — BASIC METABOLIC PANEL
BUN / CREAT RATIO: 20 (ref 12–28)
BUN: 30 mg/dL — ABNORMAL HIGH (ref 8–27)
CO2: 23 mmol/L (ref 20–29)
CREATININE: 1.52 mg/dL — AB (ref 0.57–1.00)
Calcium: 9.5 mg/dL (ref 8.7–10.3)
Chloride: 103 mmol/L (ref 96–106)
GFR, EST AFRICAN AMERICAN: 37 mL/min/{1.73_m2} — AB (ref >59)
GFR, EST NON AFRICAN AMERICAN: 32 mL/min/{1.73_m2} — AB (ref >59)
Glucose: 144 mg/dL — ABNORMAL HIGH (ref 65–99)
Potassium: 4 mmol/L (ref 3.5–5.2)
Sodium: 146 mmol/L — ABNORMAL HIGH (ref 134–144)

## 2017-11-11 DIAGNOSIS — I5042 Chronic combined systolic (congestive) and diastolic (congestive) heart failure: Secondary | ICD-10-CM | POA: Diagnosis not present

## 2017-11-11 DIAGNOSIS — I130 Hypertensive heart and chronic kidney disease with heart failure and stage 1 through stage 4 chronic kidney disease, or unspecified chronic kidney disease: Secondary | ICD-10-CM | POA: Diagnosis not present

## 2017-11-11 DIAGNOSIS — N183 Chronic kidney disease, stage 3 (moderate): Secondary | ICD-10-CM | POA: Diagnosis not present

## 2017-11-11 DIAGNOSIS — I481 Persistent atrial fibrillation: Secondary | ICD-10-CM | POA: Diagnosis not present

## 2017-11-11 DIAGNOSIS — E1122 Type 2 diabetes mellitus with diabetic chronic kidney disease: Secondary | ICD-10-CM | POA: Diagnosis not present

## 2017-11-11 DIAGNOSIS — J449 Chronic obstructive pulmonary disease, unspecified: Secondary | ICD-10-CM | POA: Diagnosis not present

## 2017-11-12 ENCOUNTER — Other Ambulatory Visit: Payer: Self-pay

## 2017-11-12 ENCOUNTER — Telehealth (HOSPITAL_COMMUNITY): Payer: Self-pay | Admitting: *Deleted

## 2017-11-12 ENCOUNTER — Inpatient Hospital Stay
Admission: EM | Admit: 2017-11-12 | Discharge: 2017-11-15 | DRG: 291 | Disposition: A | Discharge status: Home Health | Payer: Medicare Other | Attending: Internal Medicine | Admitting: Internal Medicine

## 2017-11-12 ENCOUNTER — Emergency Department: Payer: Medicare Other

## 2017-11-12 ENCOUNTER — Encounter: Payer: Self-pay | Admitting: Emergency Medicine

## 2017-11-12 DIAGNOSIS — Z96641 Presence of right artificial hip joint: Secondary | ICD-10-CM | POA: Diagnosis present

## 2017-11-12 DIAGNOSIS — Z8701 Personal history of pneumonia (recurrent): Secondary | ICD-10-CM

## 2017-11-12 DIAGNOSIS — I2510 Atherosclerotic heart disease of native coronary artery without angina pectoris: Secondary | ICD-10-CM | POA: Diagnosis present

## 2017-11-12 DIAGNOSIS — J440 Chronic obstructive pulmonary disease with acute lower respiratory infection: Secondary | ICD-10-CM | POA: Diagnosis present

## 2017-11-12 DIAGNOSIS — J441 Chronic obstructive pulmonary disease with (acute) exacerbation: Secondary | ICD-10-CM | POA: Diagnosis not present

## 2017-11-12 DIAGNOSIS — Z833 Family history of diabetes mellitus: Secondary | ICD-10-CM

## 2017-11-12 DIAGNOSIS — R0602 Shortness of breath: Secondary | ICD-10-CM | POA: Diagnosis present

## 2017-11-12 DIAGNOSIS — M1990 Unspecified osteoarthritis, unspecified site: Secondary | ICD-10-CM | POA: Diagnosis present

## 2017-11-12 DIAGNOSIS — I5042 Chronic combined systolic (congestive) and diastolic (congestive) heart failure: Secondary | ICD-10-CM | POA: Diagnosis not present

## 2017-11-12 DIAGNOSIS — I5033 Acute on chronic diastolic (congestive) heart failure: Secondary | ICD-10-CM | POA: Diagnosis present

## 2017-11-12 DIAGNOSIS — I481 Persistent atrial fibrillation: Secondary | ICD-10-CM | POA: Diagnosis present

## 2017-11-12 DIAGNOSIS — E785 Hyperlipidemia, unspecified: Secondary | ICD-10-CM | POA: Diagnosis present

## 2017-11-12 DIAGNOSIS — Z950 Presence of cardiac pacemaker: Secondary | ICD-10-CM

## 2017-11-12 DIAGNOSIS — N183 Chronic kidney disease, stage 3 (moderate): Secondary | ICD-10-CM | POA: Diagnosis present

## 2017-11-12 DIAGNOSIS — H9190 Unspecified hearing loss, unspecified ear: Secondary | ICD-10-CM | POA: Diagnosis present

## 2017-11-12 DIAGNOSIS — I4891 Unspecified atrial fibrillation: Secondary | ICD-10-CM | POA: Diagnosis not present

## 2017-11-12 DIAGNOSIS — F17210 Nicotine dependence, cigarettes, uncomplicated: Secondary | ICD-10-CM | POA: Diagnosis present

## 2017-11-12 DIAGNOSIS — J9602 Acute respiratory failure with hypercapnia: Secondary | ICD-10-CM | POA: Diagnosis present

## 2017-11-12 DIAGNOSIS — J9601 Acute respiratory failure with hypoxia: Secondary | ICD-10-CM | POA: Diagnosis not present

## 2017-11-12 DIAGNOSIS — K219 Gastro-esophageal reflux disease without esophagitis: Secondary | ICD-10-CM | POA: Diagnosis present

## 2017-11-12 DIAGNOSIS — Z7901 Long term (current) use of anticoagulants: Secondary | ICD-10-CM | POA: Diagnosis not present

## 2017-11-12 DIAGNOSIS — J189 Pneumonia, unspecified organism: Secondary | ICD-10-CM | POA: Diagnosis present

## 2017-11-12 DIAGNOSIS — Z90710 Acquired absence of both cervix and uterus: Secondary | ICD-10-CM

## 2017-11-12 DIAGNOSIS — Z888 Allergy status to other drugs, medicaments and biological substances status: Secondary | ICD-10-CM

## 2017-11-12 DIAGNOSIS — Z8349 Family history of other endocrine, nutritional and metabolic diseases: Secondary | ICD-10-CM

## 2017-11-12 DIAGNOSIS — I130 Hypertensive heart and chronic kidney disease with heart failure and stage 1 through stage 4 chronic kidney disease, or unspecified chronic kidney disease: Principal | ICD-10-CM | POA: Diagnosis present

## 2017-11-12 DIAGNOSIS — Z841 Family history of disorders of kidney and ureter: Secondary | ICD-10-CM

## 2017-11-12 DIAGNOSIS — E1122 Type 2 diabetes mellitus with diabetic chronic kidney disease: Secondary | ICD-10-CM | POA: Diagnosis present

## 2017-11-12 DIAGNOSIS — Z7951 Long term (current) use of inhaled steroids: Secondary | ICD-10-CM | POA: Diagnosis not present

## 2017-11-12 DIAGNOSIS — Z79899 Other long term (current) drug therapy: Secondary | ICD-10-CM

## 2017-11-12 DIAGNOSIS — J449 Chronic obstructive pulmonary disease, unspecified: Secondary | ICD-10-CM | POA: Diagnosis not present

## 2017-11-12 DIAGNOSIS — Z800 Family history of malignant neoplasm of digestive organs: Secondary | ICD-10-CM

## 2017-11-12 DIAGNOSIS — Z951 Presence of aortocoronary bypass graft: Secondary | ICD-10-CM

## 2017-11-12 DIAGNOSIS — Z9049 Acquired absence of other specified parts of digestive tract: Secondary | ICD-10-CM

## 2017-11-12 DIAGNOSIS — Z8249 Family history of ischemic heart disease and other diseases of the circulatory system: Secondary | ICD-10-CM

## 2017-11-12 LAB — CBC WITH DIFFERENTIAL/PLATELET
BASOS PCT: 1 %
Basophils Absolute: 0.2 10*3/uL — ABNORMAL HIGH (ref 0–0.1)
EOS ABS: 0.2 10*3/uL (ref 0–0.7)
EOS PCT: 1 %
HCT: 37.2 % (ref 35.0–47.0)
Hemoglobin: 12.4 g/dL (ref 12.0–16.0)
LYMPHS ABS: 2.4 10*3/uL (ref 1.0–3.6)
Lymphocytes Relative: 13 %
MCH: 30 pg (ref 26.0–34.0)
MCHC: 33.3 g/dL (ref 32.0–36.0)
MCV: 90 fL (ref 80.0–100.0)
MONOS PCT: 6 %
Monocytes Absolute: 1.1 10*3/uL — ABNORMAL HIGH (ref 0.2–0.9)
NEUTROS PCT: 79 %
Neutro Abs: 14.1 10*3/uL — ABNORMAL HIGH (ref 1.4–6.5)
Platelets: 316 10*3/uL (ref 150–440)
RBC: 4.13 MIL/uL (ref 3.80–5.20)
RDW: 15.9 % — ABNORMAL HIGH (ref 11.5–14.5)
WBC: 17.9 10*3/uL — AB (ref 3.6–11.0)

## 2017-11-12 LAB — PROCALCITONIN: Procalcitonin: <0.1 ng/mL

## 2017-11-12 LAB — LIPASE, BLOOD: LIPASE: 28 U/L (ref 11–51)

## 2017-11-12 LAB — COMPREHENSIVE METABOLIC PANEL
ALBUMIN: 3.7 g/dL (ref 3.5–5.0)
ALK PHOS: 57 U/L (ref 38–126)
ALT: 15 U/L (ref 0–44)
AST: 23 U/L (ref 15–41)
Anion gap: 8 (ref 5–15)
BILIRUBIN TOTAL: 0.6 mg/dL (ref 0.3–1.2)
BUN: 31 mg/dL — ABNORMAL HIGH (ref 8–23)
CALCIUM: 9 mg/dL (ref 8.9–10.3)
CO2: 24 mmol/L (ref 22–32)
CREATININE: 1.41 mg/dL — AB (ref 0.44–1.00)
Chloride: 103 mmol/L (ref 98–111)
GFR calc Af Amer: 40 mL/min — ABNORMAL LOW (ref >60)
GFR, EST NON AFRICAN AMERICAN: 34 mL/min — AB (ref >60)
Glucose, Bld: 191 mg/dL — ABNORMAL HIGH (ref 70–99)
Potassium: 3.7 mmol/L (ref 3.5–5.1)
SODIUM: 135 mmol/L (ref 135–145)
TOTAL PROTEIN: 7.2 g/dL (ref 6.5–8.1)

## 2017-11-12 LAB — BLOOD GAS, VENOUS
ACID-BASE EXCESS: 1.2 mmol/L (ref 0.0–2.0)
BICARBONATE: 26 mmol/L (ref 20.0–28.0)
O2 Saturation: 85.4 %
PATIENT TEMPERATURE: 37
PH VEN: 7.41 (ref 7.250–7.430)
pCO2, Ven: 41 mmHg — ABNORMAL LOW (ref 44.0–60.0)
pO2, Ven: 50 mmHg — ABNORMAL HIGH (ref 32.0–45.0)

## 2017-11-12 LAB — TROPONIN I: Troponin I: 0.04 ng/mL (ref <0.03)

## 2017-11-12 LAB — TSH: TSH: 2.847 u[IU]/mL (ref 0.350–4.500)

## 2017-11-12 LAB — LACTIC ACID, PLASMA
Lactic Acid, Venous: 1 mmol/L (ref 0.5–1.9)
Lactic Acid, Venous: 1.6 mmol/L (ref 0.5–1.9)

## 2017-11-12 LAB — BRAIN NATRIURETIC PEPTIDE: B Natriuretic Peptide: 1106 pg/mL — ABNORMAL HIGH (ref 0.0–100.0)

## 2017-11-12 MED ORDER — ACETAMINOPHEN 325 MG PO TABS: 325.0000 mg | ORAL_TABLET | ORAL | Status: DC | PRN

## 2017-11-12 MED ORDER — ONDANSETRON HCL 4 MG PO TABS: 4.0000 mg | ORAL_TABLET | Freq: Four times a day (QID) | ORAL | Status: DC | PRN

## 2017-11-12 MED ORDER — IPRATROPIUM-ALBUTEROL 0.5-2.5 (3) MG/3ML IN SOLN
3.0000 mL | Freq: Once | RESPIRATORY_TRACT | Status: AC
  Administered 2017-11-12: 3 mL via RESPIRATORY_TRACT
  Filled 2017-11-12: qty 3

## 2017-11-12 MED ORDER — FLUTICASONE PROPIONATE 50 MCG/ACT NA SUSP
1.0000 | Freq: Every day | NASAL | Status: DC | PRN
  Filled 2017-11-12: qty 16

## 2017-11-12 MED ORDER — ACETAMINOPHEN 325 MG PO TABS: 650.0000 mg | ORAL_TABLET | Freq: Four times a day (QID) | ORAL | Status: DC | PRN

## 2017-11-12 MED ORDER — SODIUM CHLORIDE 0.9% FLUSH: 3.0000 mL | INTRAVENOUS | Status: DC | PRN

## 2017-11-12 MED ORDER — SODIUM CHLORIDE 0.9 % IV SOLN
500.0000 mg | INTRAVENOUS | Status: DC
  Filled 2017-11-12 (×2): qty 500

## 2017-11-12 MED ORDER — SODIUM CHLORIDE 0.9 % IV SOLN: 2.0000 g | INTRAVENOUS | Status: DC

## 2017-11-12 MED ORDER — PSYLLIUM 95 % PO PACK
1.0000 | PACK | Freq: Every day | ORAL | Status: DC
  Administered 2017-11-12 – 2017-11-15 (×4): 1 via ORAL
  Filled 2017-11-12 (×4): qty 1

## 2017-11-12 MED ORDER — SODIUM CHLORIDE 0.9 % IV SOLN
1.0000 g | INTRAVENOUS | Status: DC
  Filled 2017-11-12: qty 10

## 2017-11-12 MED ORDER — DOCUSATE SODIUM 100 MG PO CAPS
100.0000 mg | ORAL_CAPSULE | Freq: Two times a day (BID) | ORAL | Status: DC
  Administered 2017-11-13 – 2017-11-15 (×5): 100 mg via ORAL
  Filled 2017-11-12 (×5): qty 1

## 2017-11-12 MED ORDER — AMLODIPINE BESYLATE 10 MG PO TABS
10.0000 mg | ORAL_TABLET | Freq: Every day | ORAL | Status: DC
  Administered 2017-11-13 – 2017-11-15 (×3): 10 mg via ORAL
  Filled 2017-11-12 (×3): qty 1

## 2017-11-12 MED ORDER — SODIUM CHLORIDE 0.9 % IV SOLN
1.0000 g | INTRAVENOUS | Status: DC
  Administered 2017-11-12: 1 g via INTRAVENOUS
  Filled 2017-11-12: qty 10

## 2017-11-12 MED ORDER — ATORVASTATIN CALCIUM 20 MG PO TABS
20.0000 mg | ORAL_TABLET | Freq: Every evening | ORAL | Status: DC
  Administered 2017-11-12 – 2017-11-14 (×3): 20 mg via ORAL
  Filled 2017-11-12 (×3): qty 1

## 2017-11-12 MED ORDER — APIXABAN 5 MG PO TABS
5.0000 mg | ORAL_TABLET | Freq: Two times a day (BID) | ORAL | Status: DC
  Administered 2017-11-13 – 2017-11-15 (×5): 5 mg via ORAL
  Filled 2017-11-12 (×5): qty 1

## 2017-11-12 MED ORDER — FAMOTIDINE 20 MG PO TABS
20.0000 mg | ORAL_TABLET | Freq: Every day | ORAL | Status: DC
  Administered 2017-11-13 – 2017-11-15 (×3): 20 mg via ORAL
  Filled 2017-11-12 (×3): qty 1

## 2017-11-12 MED ORDER — VITAMIN D3 25 MCG (1000 UNIT) PO TABS
1000.0000 [IU] | ORAL_TABLET | Freq: Every day | ORAL | Status: DC
  Administered 2017-11-13 – 2017-11-15 (×3): 1000 [IU] via ORAL
  Filled 2017-11-12 (×3): qty 1

## 2017-11-12 MED ORDER — LORAZEPAM 0.5 MG PO TABS
0.5000 mg | ORAL_TABLET | Freq: Two times a day (BID) | ORAL | Status: DC | PRN
  Administered 2017-11-12 – 2017-11-15 (×3): 0.5 mg via ORAL
  Filled 2017-11-12 (×3): qty 1

## 2017-11-12 MED ORDER — ISOSORBIDE MONONITRATE ER 60 MG PO TB24
120.0000 mg | ORAL_TABLET | Freq: Every day | ORAL | Status: DC
  Administered 2017-11-13 – 2017-11-15 (×3): 120 mg via ORAL
  Filled 2017-11-12 (×3): qty 2

## 2017-11-12 MED ORDER — SODIUM CHLORIDE 0.9 % IV SOLN
500.0000 mg | INTRAVENOUS | Status: DC
  Administered 2017-11-12: 500 mg via INTRAVENOUS

## 2017-11-12 MED ORDER — AMIODARONE HCL 200 MG PO TABS
200.0000 mg | ORAL_TABLET | Freq: Every day | ORAL | Status: DC
  Administered 2017-11-13 – 2017-11-15 (×3): 200 mg via ORAL
  Filled 2017-11-12 (×3): qty 1

## 2017-11-12 MED ORDER — METHYLPREDNISOLONE SODIUM SUCC 125 MG IJ SOLR
125.0000 mg | Freq: Once | INTRAMUSCULAR | Status: AC
  Administered 2017-11-12: 125 mg via INTRAVENOUS
  Filled 2017-11-12: qty 2

## 2017-11-12 MED ORDER — ACETAMINOPHEN 650 MG RE SUPP: 650.0000 mg | Freq: Four times a day (QID) | RECTAL | Status: DC | PRN

## 2017-11-12 MED ORDER — GUAIFENESIN ER 600 MG PO TB12
600.0000 mg | ORAL_TABLET | Freq: Two times a day (BID) | ORAL | Status: DC
  Administered 2017-11-12 – 2017-11-15 (×6): 600 mg via ORAL
  Filled 2017-11-12 (×6): qty 1

## 2017-11-12 MED ORDER — SODIUM CHLORIDE 0.9 % IV SOLN: 250.0000 mL | INTRAVENOUS | Status: DC | PRN

## 2017-11-12 MED ORDER — HYDRALAZINE HCL 50 MG PO TABS
25.0000 mg | ORAL_TABLET | Freq: Three times a day (TID) | ORAL | Status: DC
  Administered 2017-11-12 – 2017-11-15 (×8): 25 mg via ORAL
  Filled 2017-11-12 (×8): qty 1

## 2017-11-12 MED ORDER — ENOXAPARIN SODIUM 40 MG/0.4ML ~~LOC~~ SOLN: 40.0000 mg | SUBCUTANEOUS | Status: DC

## 2017-11-12 MED ORDER — MAGNESIUM SULFATE 2 GM/50ML IV SOLN
2.0000 g | Freq: Once | INTRAVENOUS | Status: AC
  Administered 2017-11-12: 2 g via INTRAVENOUS
  Filled 2017-11-12: qty 50

## 2017-11-12 MED ORDER — FUROSEMIDE 10 MG/ML IJ SOLN
20.0000 mg | Freq: Two times a day (BID) | INTRAMUSCULAR | Status: DC
  Administered 2017-11-12 – 2017-11-13 (×3): 20 mg via INTRAVENOUS
  Filled 2017-11-12 (×3): qty 2

## 2017-11-12 MED ORDER — METOPROLOL SUCCINATE ER 50 MG PO TB24
100.0000 mg | ORAL_TABLET | Freq: Every day | ORAL | Status: DC
  Administered 2017-11-13 – 2017-11-15 (×3): 100 mg via ORAL
  Filled 2017-11-12 (×3): qty 2

## 2017-11-12 MED ORDER — BUDESONIDE 0.25 MG/2ML IN SUSP
0.2500 mg | Freq: Two times a day (BID) | RESPIRATORY_TRACT | Status: DC
  Administered 2017-11-13 – 2017-11-15 (×5): 0.25 mg via RESPIRATORY_TRACT
  Filled 2017-11-12 (×5): qty 2

## 2017-11-12 MED ORDER — SODIUM CHLORIDE 0.9% FLUSH
3.0000 mL | Freq: Two times a day (BID) | INTRAVENOUS | Status: DC
  Administered 2017-11-12 – 2017-11-15 (×6): 3 mL via INTRAVENOUS

## 2017-11-12 MED ORDER — ONDANSETRON HCL 4 MG/2ML IJ SOLN: 4.0000 mg | Freq: Four times a day (QID) | INTRAMUSCULAR | Status: DC | PRN

## 2017-11-12 MED ORDER — OXYBUTYNIN CHLORIDE ER 5 MG PO TB24
5.0000 mg | ORAL_TABLET | Freq: Every day | ORAL | Status: DC
  Administered 2017-11-12 – 2017-11-14 (×3): 5 mg via ORAL
  Filled 2017-11-12 (×4): qty 1

## 2017-11-12 MED ORDER — HYDROCODONE-ACETAMINOPHEN 7.5-325 MG PO TABS
1.0000 | ORAL_TABLET | Freq: Two times a day (BID) | ORAL | Status: DC | PRN
  Administered 2017-11-12 – 2017-11-15 (×3): 1 via ORAL
  Filled 2017-11-12 (×3): qty 1

## 2017-11-12 MED ORDER — IPRATROPIUM-ALBUTEROL 0.5-2.5 (3) MG/3ML IN SOLN
3.0000 mL | RESPIRATORY_TRACT | Status: DC
  Administered 2017-11-12 – 2017-11-13 (×3): 3 mL via RESPIRATORY_TRACT
  Filled 2017-11-12 (×3): qty 3
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
## 2017-11-12 NOTE — Progress Notes (Signed)
Advanced care plan.  Purpose of the Encounter: CODE STATUS  Parties in Attendance: Patient herself and daughter  Patient's Decision Capacity: Intact  Subjective/Patient's story:  Patient is a 82 year old with history of CHF, COPD presenting with worsening shortness of breath  Objective/Medical story  Discussed with the patient regarding her desires for cardiac and pulmonary resuscitation  Goals of care determination:  States that she would like to be resuscitated initially then her daughter will make further decisions based on her condition   CODE STATUS: Full code   Time spent discussing advanced care planning: 16 minutes
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
## 2017-11-12 NOTE — [place] Notes (Signed)
Talked to Dr. Duane Boston about patient's family reported that patient takes Ativan 0.5 mg bid at home, order given. RN will continue to monitor.

## 2017-11-12 NOTE — ED Triage Notes (Signed)
C/O worsening SOB x 2 days.  Patient has history CHF

## 2017-11-12 NOTE — ED Notes (Signed)
Lab called with a critical trop of 0.04. Dr. Karma Greaser made aware

## 2017-11-12 NOTE — ED Notes (Signed)
Patient placed on 2l/  in waiting room.  Report called to [facility]

## 2017-11-12 NOTE — Telephone Encounter (Signed)
PT called to report pt is having some wheezing today, wt is up to 150.8 lb (was 148 lb earlier this week).  She states o2 sats were a little low at 89-91%, BP 160/72.  No edema noted.  She states pt's daughter is not home at this time and she is not sure how she has been taking Furosemide.  PT does state she has spoken w/pt's daughter who is on her way home now and she will call us if needed.  Advised to tell daughter if pt has not taken Lasix today to take it when she gets home, if not helping or pt already took a dose to day to please call us back,  PT verbalized understanding and will relay info to pt's daughter.

## 2017-11-13 ENCOUNTER — Telehealth: Payer: Self-pay | Admitting: Internal Medicine

## 2017-11-13 ENCOUNTER — Ambulatory Visit: Payer: Medicare Other | Admitting: Family Medicine

## 2017-11-13 LAB — TROPONIN I
TROPONIN I: 0.04 ng/mL — AB (ref <0.03)
Troponin I: 0.03 ng/mL (ref <0.03)
Troponin I: 0.03 ng/mL (ref <0.03)

## 2017-11-13 LAB — CBC
HCT: 33.6 % — ABNORMAL LOW (ref 35.0–47.0)
Hemoglobin: 11.4 g/dL — ABNORMAL LOW (ref 12.0–16.0)
MCH: 30.7 pg (ref 26.0–34.0)
MCHC: 34 g/dL (ref 32.0–36.0)
MCV: 90.3 fL (ref 80.0–100.0)
PLATELETS: 258 10*3/uL (ref 150–440)
RBC: 3.72 MIL/uL — ABNORMAL LOW (ref 3.80–5.20)
RDW: 15.7 % — AB (ref 11.5–14.5)
WBC: 10.8 10*3/uL (ref 3.6–11.0)

## 2017-11-13 LAB — BASIC METABOLIC PANEL
Anion gap: 7 (ref 5–15)
BUN: 35 mg/dL — AB (ref 8–23)
CALCIUM: 8.5 mg/dL — AB (ref 8.9–10.3)
CO2: 25 mmol/L (ref 22–32)
CREATININE: 1.68 mg/dL — AB (ref 0.44–1.00)
Chloride: 104 mmol/L (ref 98–111)
GFR calc Af Amer: 32 mL/min — ABNORMAL LOW (ref >60)
GFR, EST NON AFRICAN AMERICAN: 28 mL/min — AB (ref >60)
GLUCOSE: 209 mg/dL — AB (ref 70–99)
Potassium: 3.8 mmol/L (ref 3.5–5.1)
Sodium: 136 mmol/L (ref 135–145)

## 2017-11-13 MED ORDER — IPRATROPIUM-ALBUTEROL 0.5-2.5 (3) MG/3ML IN SOLN
3.0000 mL | Freq: Four times a day (QID) | RESPIRATORY_TRACT | Status: DC
  Administered 2017-11-13 – 2017-11-15 (×7): 3 mL via RESPIRATORY_TRACT
  Filled 2017-11-13 (×8): qty 3

## 2017-11-13 MED ORDER — GUAIFENESIN-DM 100-10 MG/5ML PO SYRP
5.0000 mL | ORAL_SOLUTION | ORAL | Status: DC | PRN
  Administered 2017-11-13 – 2017-11-15 (×6): 5 mL via ORAL
  Filled 2017-11-13 (×6): qty 5

## 2017-11-13 NOTE — [place] Notes (Signed)
[facility] [place] NAME: Wanda Brooks    MR#:  [id number]  DATE OF BIRTH:  1935/08/11  SUBJECTIVE:  Came in with SOB Says she is able to speak whole sentence w/o getting sob and slept good dter in the room Gets PT at home Cont to smoke  REVIEW OF SYSTEMS:   Review of Systems  Constitutional: Negative for chills, fever and weight loss.  HENT: Negative for ear discharge, ear pain and nosebleeds.   Eyes: Negative for blurred vision, pain and discharge.  Respiratory: Positive for cough and shortness of breath. Negative for sputum production, wheezing and stridor.   Cardiovascular: Negative for chest pain, palpitations, orthopnea and PND.  Gastrointestinal: Negative for abdominal pain, diarrhea, nausea and vomiting.  Genitourinary: Negative for frequency and urgency.  Musculoskeletal: Negative for back pain and joint pain.  Neurological: Negative for sensory change, speech change, focal weakness and weakness.  Psychiatric/Behavioral: Negative for depression and hallucinations. The patient is not nervous/anxious.    Tolerating Diet:yes Tolerating PT: gets at home  DRUG ALLERGIES:   Allergies  Allergen Reactions  . Dronedarone Diarrhea  . Aspirin Other (See Comments)    Stomach burning, Can only take coated  . Daypro [Oxaprozin] Other (See Comments)    Dizziness, Affects driving    VITALS:  Blood pressure (!) 151/69, pulse 65, temperature 97.7 F (36.5 C), temperature source Oral, resp. rate 18, height 5\' 5"  (1.651 m), weight 67.7 kg, SpO2 99 %.  PHYSICAL EXAMINATION:   Physical Exam  GENERAL:  82 y.o.-year-old patient lying in the bed with no acute distress.  EYES: Pupils equal, round, reactive to light and accommodation. No scleral icterus. Extraocular muscles intact.  HEENT: Head atraumatic, normocephalic. Oropharynx and nasopharynx clear.  NECK:  Supple, no jugular venous distention. No thyroid enlargement, no  tenderness.  LUNGS: Normal breath sounds bilaterally, no wheezing,few bibasilar rales, no rhonchi. No use of accessory muscles of respiration.  CARDIOVASCULAR: S1, S2 normal. No murmurs, rubs, or gallops.  ABDOMEN: Soft, nontender, nondistended. Bowel sounds present. No organomegaly or mass.  EXTREMITIES: No cyanosis, clubbing or edema b/l.    NEUROLOGIC: Cranial nerves II through XII are intact. No focal Motor or sensory deficits b/l.   PSYCHIATRIC:  patient is alert and oriented x 3.  SKIN: No obvious rash, lesion, or ulcer.   LABORATORY PANEL:  CBC Recent Labs  Lab 11/13/17 0112  WBC 10.8  HGB 11.4*  HCT 33.6*  PLT 258    Chemistries  Recent Labs  Lab 11/12/17 1915 11/13/17 0112  NA 135 136  K 3.7 3.8  CL 103 104  CO2 24 25  GLUCOSE 191* 209*  BUN 31* 35*  CREATININE 1.41* 1.68*  CALCIUM 9.0 8.5*  AST 23  --   ALT 15  --   ALKPHOS 57  --   BILITOT 0.6  --    Cardiac Enzymes Recent Labs  Lab 11/13/17 0716  TROPONINI 0.03*   RADIOLOGY:  Dg Chest Portable 1 View  Result Date: 11/12/2017 CLINICAL DATA:  Worsening shortness of breath 3 days.  CHF. EXAM: PORTABLE CHEST 1 VIEW COMPARISON:  09/15/2017 FINDINGS: Left-sided pacemaker and sternotomy wires unchanged. Lungs are adequately inflated with hazy prominence of the perihilar/bibasilar markings suggesting mild interstitial edema. Mild increased left retrocardiac opacification as cannot exclude infection. Stable cardiomegaly. Remainder of the exam is unchanged. IMPRESSION: Hazy prominence of the perihilar bibasilar markings likely due to mild interstitial edema. Mild opacification the left retrocardiac  region which may be due to infection. Electronically Signed   By: Marin Olp M.D.   On: 11/12/2017 19:37   ASSESSMENT AND PLAN:  Wanda Brooks  is a 82 y.o. female with a known history of atrial fibrillation, chronic diastolic CHF, chronic kidney disease stage III and diabetes type 2 who is presenting to the hospital  with complaint of shortness of breath for the past few days.  1.  Shortness of breath due to combination of acute on chronic diastolic CHF -IV lasix 20 mg bid -UOP 600 cc Watch creat -wean oxygen to RA if able to  2.  Acute on chronic COPD exacerbation treat with nebulizer steroids -cont to smoke---not motivated to stop!! -set up home nebulizer -wean oxygen to RA  3. Tobacco abuse counselled smoking cessation--does not seem much motivated  4.  Atrial fibrillation  Chronic with h/o pacemaker -follows Dr Klein--recently underwent cardioversion -cont BB and amiodarone -cont eliquis  5.  Essential hypertension continue hydralazine, amlodipine, BB  6.  Miscellaneous eliquis for DVT prophylaxis  Pt has been getting PT at home. Will resume that at d/c OOB to chair    Case discussed with Care Management/Social Worker. Management plans discussed with the patient, family and they are in agreement.  CODE STATUS: FULL  DVT Prophylaxis: eliquis  TOTAL TIME TAKING CARE OF THIS PATIENT: *40* minutes.  >50% time spent on counselling and coordination of care  POSSIBLE D/C IN  1-2 DAYS, DEPENDING ON CLINICAL CONDITION.  Note: This dictation was prepared with Dragon dictation along with smaller phrase technology. Any transcriptional errors that result from this process are unintentional.  Wanda Brooks M.D on 11/13/2017 at 8:23 AM  Between 7am to 6pm - Pager - [phone]  After 6pm go to [email or web address] - password [email or web address] [facility]  Office  [phone]  CC: Primary care physician; Wanda Brooks, NPPatient ID: Wanda Brooks, female   DOB: 1935-06-23, 82 y.o.   MRN: [id number]

## 2017-11-14 LAB — CREATININE, SERUM
CREATININE: 1.91 mg/dL — AB (ref 0.44–1.00)
GFR calc Af Amer: 27 mL/min — ABNORMAL LOW (ref >60)
GFR calc non Af Amer: 24 mL/min — ABNORMAL LOW (ref >60)

## 2017-11-14 MED ORDER — PREDNISONE 50 MG PO TABS
50.0000 mg | ORAL_TABLET | Freq: Every day | ORAL | Status: DC
  Administered 2017-11-14 – 2017-11-15 (×2): 50 mg via ORAL
  Filled 2017-11-14 (×2): qty 1

## 2017-11-14 NOTE — Progress Notes (Signed)
Wanda Brooks at [place] NAME: Wanda Brooks    MR#:  [id number]  DATE OF BIRTH:  18-Mar-1935  SUBJECTIVE:  Came in with SOB Says she is able to speak whole sentence w/o getting sob and slept good Gets PT at home Continues  to smoke Good UOP over 24 hours REVIEW OF SYSTEMS:   Review of Systems  Constitutional: Negative for chills, fever and weight loss.  HENT: Negative for ear discharge, ear pain and nosebleeds.   Eyes: Negative for blurred vision, pain and discharge.  Respiratory: Positive for cough and shortness of breath. Negative for sputum production, wheezing and stridor.   Cardiovascular: Negative for chest pain, palpitations, orthopnea and PND.  Gastrointestinal: Negative for abdominal pain, diarrhea, nausea and vomiting.  Genitourinary: Negative for frequency and urgency.  Musculoskeletal: Negative for back pain and joint pain.  Neurological: Negative for sensory change, speech change, focal weakness and weakness.  Psychiatric/Behavioral: Negative for depression and hallucinations. The patient is not nervous/anxious.    Tolerating Diet:yes Tolerating PT: gets at home  DRUG ALLERGIES:   Allergies  Allergen Reactions  . Dronedarone Diarrhea  . Aspirin Other (See Comments)    Stomach burning, Can only take coated  . Daypro [Oxaprozin] Other (See Comments)    Dizziness, Affects driving    VITALS:  Blood pressure (!) 149/61, pulse 69, temperature 98.4 F (36.9 C), temperature source Oral, resp. rate 19, height 5\' 5"  (1.651 m), weight 68.5 kg, SpO2 92 %.  PHYSICAL EXAMINATION:   Physical Exam  GENERAL:  82 y.o.-year-old patient lying in the bed with no acute distress.  EYES: Pupils equal, round, reactive to light and accommodation. No scleral icterus. Extraocular muscles intact.  HEENT: Head atraumatic, normocephalic. Oropharynx and nasopharynx clear.  NECK:  Supple, no jugular venous distention. No thyroid enlargement,  no tenderness.  LUNGS: Normal breath sounds bilaterally, no wheezing,few bibasilar rales, no rhonchi. No use of accessory muscles of respiration.  CARDIOVASCULAR: S1, S2 normal. No murmurs, rubs, or gallops.  ABDOMEN: Soft, nontender, nondistended. Bowel sounds present. No organomegaly or mass.  EXTREMITIES: No cyanosis, clubbing or edema b/l.    NEUROLOGIC: Cranial nerves II through XII are intact. No focal Motor or sensory deficits b/l.   PSYCHIATRIC:  patient is alert and oriented x 3.  SKIN: No obvious rash, lesion, or ulcer.   LABORATORY PANEL:  CBC Recent Labs  Lab 11/13/17 0112  WBC 10.8  HGB 11.4*  HCT 33.6*  PLT 258    Chemistries  Recent Labs  Lab 11/12/17 1915 11/13/17 0112  NA 135 136  K 3.7 3.8  CL 103 104  CO2 24 25  GLUCOSE 191* 209*  BUN 31* 35*  CREATININE 1.41* 1.68*  CALCIUM 9.0 8.5*  AST 23  --   ALT 15  --   ALKPHOS 57  --   BILITOT 0.6  --    Cardiac Enzymes Recent Labs  Lab 11/13/17 1340  TROPONINI 0.03*   RADIOLOGY:  Dg Chest Portable 1 View  Result Date: 11/12/2017 CLINICAL DATA:  Worsening shortness of breath 3 days.  CHF. EXAM: PORTABLE CHEST 1 VIEW COMPARISON:  09/15/2017 FINDINGS: Left-sided pacemaker and sternotomy wires unchanged. Lungs are adequately inflated with hazy prominence of the perihilar/bibasilar markings suggesting mild interstitial edema. Mild increased left retrocardiac opacification as cannot exclude infection. Stable cardiomegaly. Remainder of the exam is unchanged. IMPRESSION: Hazy prominence of the perihilar bibasilar markings likely due to mild interstitial edema. Mild opacification the left  retrocardiac region which may be due to infection. Electronically Signed   By: Wanda Brooks M.D.   On: 11/12/2017 19:37   ASSESSMENT AND PLAN:  Wanda Brooks  is a 82 y.o. female with a known history of atrial fibrillation, chronic diastolic CHF, chronic kidney disease stage III and diabetes type 2 who is presenting to the hospital  with complaint of shortness of breath for the past few days.  1.  Shortness of breath due to combination of acute on chronic diastolic CHF -IV lasix 20 mg bid---holding today -UOP 3200 cc since admission -check creat -currently on RA 92%  2.  Acute on chronic COPD exacerbation treat with nebulizer steroids -cont to smoke---not motivated to stop!! -set up home nebulizer -weaned oxygen to RA--will ambulate and make sure sats remain stable  3. Tobacco abuse counselled smoking cessation--does not seem much motivated  4.  Atrial fibrillation  Chronic with h/o pacemaker -follows Dr Klein--recently underwent cardioversion -cont BB and amiodarone -cont eliquis  5.  Essential hypertension continue hydralazine, amlodipine, BB  6.  Miscellaneous eliquis for DVT prophylaxis  Pt has been getting PT at home. Will resume that at d/c OOB to chair    Case discussed with Care Management/Social Worker. Management plans discussed with the patient, family and they are in agreement.  CODE STATUS: FULL  DVT Prophylaxis: eliquis  TOTAL TIME TAKING CARE OF THIS PATIENT: *40* minutes.  >50% time spent on counselling and coordination of care  POSSIBLE D/C IN  1 DAYS, DEPENDING ON CLINICAL CONDITION.  Note: This dictation was prepared with Dragon dictation along with smaller phrase technology. Any transcriptional errors that result from this process are unintentional.  Wanda Brooks M.D on 11/14/2017 at 8:33 AM  Between 7am to 6pm - Pager - [phone]  After 6pm go to [email or web address] - password [email or web address] [facility]  Office  [phone]  CC: Primary care physician; Wanda Brooks, NPPatient ID: Wanda Brooks, female   DOB: Feb 09, 1936, 82 y.o.   MRN: [id number]

## 2017-11-15 MED ORDER — FUROSEMIDE 40 MG PO TABS: 40.0000 mg | ORAL_TABLET | ORAL | 0 refills | Status: DC

## 2017-11-15 MED ORDER — PREDNISONE 10 MG PO TABS: ORAL_TABLET | ORAL | 0 refills | Status: DC

## 2017-11-15 MED ORDER — APIXABAN 2.5 MG PO TABS: 2.5000 mg | ORAL_TABLET | Freq: Two times a day (BID) | ORAL | 1 refills | Status: DC

## 2017-11-15 MED ORDER — IPRATROPIUM-ALBUTEROL 0.5-2.5 (3) MG/3ML IN SOLN: 3.0000 mL | Freq: Four times a day (QID) | RESPIRATORY_TRACT | 1 refills | Status: DC | PRN

## 2017-11-15 MED ORDER — GUAIFENESIN-DM 100-10 MG/5ML PO SYRP: 5.0000 mL | ORAL_SOLUTION | ORAL | 0 refills | Status: DC | PRN

## 2017-11-15 MED ORDER — APIXABAN 2.5 MG PO TABS: 2.5000 mg | ORAL_TABLET | Freq: Two times a day (BID) | ORAL | Status: DC

## 2017-11-15 NOTE — Care Management Note (Signed)
Case Management Note  Patient Details  Name: BUFFI EWTON MRN: [id number] Date of Birth: [birth year]  Subjective/Objective:        Possible discharge later today.  Patient lives with daughter.  Currently receives home health PT with [facility].  Helene Kelp notified of possible d/c today.  Choice offered for DME/Nebulizer.  Daughter would like [facility]; notified Corene Cornea with [facility].  Currently on room air.  She uses a walker at home.  Denies difficulty affording medications.  No transportation issues.  Daughter thinks maybe the visits need to be increased for the HF protocol.  Spoke to [place] and she is monitored per protocol.  Daughter thinks she will want to increase to 2 times per week.  She will speak with [facility] about that.                  Action/Plan:   Expected Discharge Date:  11/15/17               Expected Discharge Plan:  [place]  In-House Referral:     Discharge planning Services  CM Consult  Post Acute Care Choice:  Durable Medical Equipment, Resumption of Svcs/PTA Provider(Chose advanced for DME; Resuming HH PT with [facility]) Choice offered to:  Adult Children  DME Arranged:  [facility] DME Agency:  [place] Arranged:  PT [place] Agency:  [facility] (formerly [facility]  Status of Service:  Completed, signed off  If discussed at [employer] of Stay Meetings, dates discussed:    Additional Comments:  Elza Rafter, RN 11/15/2017, 12:36 PM

## 2017-11-15 NOTE — Care Management Important Message (Signed)
Copy of signed IM left with patient in room.  

## 2017-11-15 NOTE — Progress Notes (Deleted)
Discharge instructions  Explained to pt/ verbalized an understanding/ iv and tele removed/ right groin site WNL.
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
## 2017-11-15 NOTE — Progress Notes (Signed)
Discharge instructions explained to pt and pts daughter/ verbalized an understanding / iv and tele removed/ transported off unit via wheelchair.

## 2017-11-16 ENCOUNTER — Telehealth: Payer: Self-pay | Admitting: *Deleted

## 2017-11-16 ENCOUNTER — Other Ambulatory Visit: Payer: Self-pay | Admitting: Internal Medicine

## 2017-11-16 MED ORDER — HYDROCODONE-ACETAMINOPHEN 7.5-325 MG PO TABS: 1.0000 | ORAL_TABLET | Freq: Two times a day (BID) | ORAL | 0 refills | Status: DC | PRN

## 2017-11-16 NOTE — Telephone Encounter (Signed)
Name of Medication: Hydrocodone apap 7.5-325 mg Name of Pharmacy: [facility] or Written Date and Quantity: #60 on 09/14/17 Last Office Visit and Type: 03/25/17 pain mgt Next Office Visit and Type: 11/22/17 HFU Last Controlled Substance Agreement Date: 03/25/17 Last UDS:03/25/17

## 2017-11-16 NOTE — Telephone Encounter (Signed)
Transition Care Management Follow-up Telephone Call TCM completed by pts daughter Wanda Brooks   Date discharged?11/15/2017   How have you been since you were released from the hospital? ok   Do you understand why you were in the hospital? yes   Do you understand the discharge instructions? yes   Where were you discharged to? home   Items Reviewed:  Medications reviewed: yes  Allergies reviewed: yes  Dietary changes reviewed: yes  Referrals reviewed: yes   Functional Questionnaire:   Activities of Daily Living (ADLs):   She states they are independent in the following: independent in all areas but Wanda Brooks is there to assist as needed    Any transportation issues/concerns?: no   Any patient concerns? no   Confirmed importance and date/time of follow-up visits scheduled yes Provider Appointment booked with [facility] 9/23  Confirmed with patient if condition begins to worsen call PCP or go to the ER.  Patient was given the office number and encouraged to call back with question or concerns.  : yes

## 2017-11-16 NOTE — Telephone Encounter (Signed)
Rx refill request: hydrocodone- acetaminophen 7.5-325 mg      Last filled: 09/14/17  LOV: 09/15/17  PCP: Garnette Gunner  Pharmacy: verified

## 2017-11-16 NOTE — Telephone Encounter (Signed)
Copied from [place] [id number]. Topic: Quick Communication - Rx Refill/Question >> Nov 16, 2017  8:38 AM Cecelia Byars, NT wrote: Medication: HYDROcodone-acetaminophen ([place]) 7.5-325 MG tablet Has the patient contacted their pharmacy? no (Agent: If no, request that the patient contact the pharmacy for the refill. (Agent: If yes, when and what did the pharmacy advise?  Preferred Pharmacy (with phone number or street name  [id number] - [address], [place] [phone] (Phone) [phone] (Fax)    Agent: Please be advised that RX refills may take up to 3 business days. We ask that you follow-up with your pharmacy.

## 2017-11-16 NOTE — Telephone Encounter (Signed)
Per DPR left v/m that hydrocodone apap was sent to [facility] electronically.

## 2017-11-17 ENCOUNTER — Encounter: Payer: Medicare Other | Admitting: Internal Medicine

## 2017-11-17 LAB — CULTURE, BLOOD (ROUTINE X 2)
CULTURE: NO GROWTH
Culture: NO GROWTH

## 2017-11-18 DIAGNOSIS — I481 Persistent atrial fibrillation: Secondary | ICD-10-CM | POA: Diagnosis not present

## 2017-11-18 DIAGNOSIS — J449 Chronic obstructive pulmonary disease, unspecified: Secondary | ICD-10-CM | POA: Diagnosis not present

## 2017-11-18 DIAGNOSIS — N183 Chronic kidney disease, stage 3 (moderate): Secondary | ICD-10-CM | POA: Diagnosis not present

## 2017-11-18 DIAGNOSIS — I5042 Chronic combined systolic (congestive) and diastolic (congestive) heart failure: Secondary | ICD-10-CM | POA: Diagnosis not present

## 2017-11-18 DIAGNOSIS — E1122 Type 2 diabetes mellitus with diabetic chronic kidney disease: Secondary | ICD-10-CM | POA: Diagnosis not present

## 2017-11-18 DIAGNOSIS — I130 Hypertensive heart and chronic kidney disease with heart failure and stage 1 through stage 4 chronic kidney disease, or unspecified chronic kidney disease: Secondary | ICD-10-CM | POA: Diagnosis not present

## 2017-11-19 ENCOUNTER — Telehealth: Payer: Self-pay | Admitting: Internal Medicine

## 2017-11-19 NOTE — Telephone Encounter (Signed)
[place] for orders as stated

## 2017-11-19 NOTE — Telephone Encounter (Signed)
Copied from [place] [phone]. Topic: Quick Communication - See Telephone Encounter >> Nov 19, 2017  1:55 PM Sheran Luz wrote: CRM for notification. See Telephone encounter for: 11/19/17.  Kara from [facility] requesting verbal orders for BMT starting on 9/23.   Cb#[phone]

## 2017-11-22 ENCOUNTER — Ambulatory Visit (INDEPENDENT_AMBULATORY_CARE_PROVIDER_SITE_OTHER): Payer: Medicare Other | Admitting: Internal Medicine

## 2017-11-22 ENCOUNTER — Encounter: Payer: Self-pay | Admitting: Internal Medicine

## 2017-11-22 VITALS — BP 136/86 | HR 85 | Temp 98.1°F | Wt 149.2 lb

## 2017-11-22 DIAGNOSIS — F17200 Nicotine dependence, unspecified, uncomplicated: Secondary | ICD-10-CM | POA: Diagnosis not present

## 2017-11-22 DIAGNOSIS — N179 Acute kidney failure, unspecified: Secondary | ICD-10-CM

## 2017-11-22 DIAGNOSIS — I5033 Acute on chronic diastolic (congestive) heart failure: Secondary | ICD-10-CM

## 2017-11-22 DIAGNOSIS — Z23 Encounter for immunization: Secondary | ICD-10-CM | POA: Diagnosis not present

## 2017-11-22 DIAGNOSIS — J441 Chronic obstructive pulmonary disease with (acute) exacerbation: Secondary | ICD-10-CM | POA: Diagnosis not present

## 2017-11-22 DIAGNOSIS — I4821 Permanent atrial fibrillation: Secondary | ICD-10-CM

## 2017-11-22 DIAGNOSIS — N189 Chronic kidney disease, unspecified: Secondary | ICD-10-CM | POA: Diagnosis not present

## 2017-11-22 DIAGNOSIS — I482 Chronic atrial fibrillation: Secondary | ICD-10-CM | POA: Diagnosis not present

## 2017-11-22 LAB — BASIC METABOLIC PANEL
BUN: 36 mg/dL — ABNORMAL HIGH (ref 6–23)
CO2: 29 meq/L (ref 19–32)
CREATININE: 1.49 mg/dL — AB (ref 0.40–1.20)
Calcium: 9 mg/dL (ref 8.4–10.5)
Chloride: 103 mEq/L (ref 96–112)
GFR: 35.78 mL/min — AB (ref >60.00)
Glucose, Bld: 92 mg/dL (ref 70–99)
Potassium: 4.3 mEq/L (ref 3.5–5.1)
SODIUM: 140 meq/L (ref 135–145)

## 2017-11-22 MED ORDER — BUPROPION HCL 75 MG PO TABS: 75.0000 mg | ORAL_TABLET | Freq: Two times a day (BID) | ORAL | 2 refills | Status: DC

## 2017-11-22 NOTE — Telephone Encounter (Signed)
Lm on Kara's vm and provided verbal orders as requested.

## 2017-11-22 NOTE — Addendum Note (Signed)
Addended by: Modena Nunnery on: 11/22/2017 02:06 PM   Modules accepted: Orders

## 2017-11-22 NOTE — Patient Instructions (Signed)
Bupropion sustained-release tablets (smoking cessation)  What is this medicine?  BUPROPION (byoo PROE pee on) is used to help people quit smoking.  This medicine may be used for other purposes; ask your health care provider or pharmacist if you have questions.  COMMON BRAND NAME(S): Buproban, Zyban  What should I tell my health care provider before I take this medicine?  They need to know if you have any of these conditions:  -an eating disorder, such as anorexia or bulimia  -bipolar disorder or psychosis  -diabetes or high blood sugar, treated with medication  -glaucoma  -head injury or brain tumor  -heart disease, previous heart attack, or irregular heart beat  -high blood pressure  -kidney or liver disease  -seizures  -suicidal thoughts or a previous suicide attempt  -Tourette's syndrome  -weight loss  -an unusual or allergic reaction to bupropion, other medicines, foods, dyes, or preservatives  -breast-feeding  -pregnant or trying to become pregnant  How should I use this medicine?  Take this medicine by mouth with a glass of water. Follow the directions on the prescription label. You can take it with or without food. If it upsets your stomach, take it with food. Do not cut, crush or chew this medicine. Take your medicine at regular intervals. If you take this medicine more than once a day, take your second dose at least 8 hours after you take your first dose. To limit difficulty in sleeping, avoid taking this medicine at bedtime. Do not take your medicine more often than directed. Do not stop taking this medicine suddenly except upon the advice of your doctor. Stopping this medicine too quickly may cause serious side effects.  A special MedGuide will be given to you by the pharmacist with each prescription and refill. Be sure to read this information carefully each time.  Talk to your pediatrician regarding the use of this medicine in children. Special care may be needed.  Overdosage: If you think you have  taken too much of this medicine contact a poison control center or emergency room at once.  NOTE: This medicine is only for you. Do not share this medicine with others.  What if I miss a dose?  If you miss a dose, skip the missed dose and take your next tablet at the regular time. There should be at least 8 hours between doses. Do not take double or extra doses.  What may interact with this medicine?  Do not take this medicine with any of the following medications:  -linezolid  -MAOIs like Azilect, Carbex, Eldepryl, Marplan, Nardil, and Parnate  -methylene blue (injected into a vein)  -other medicines that contain bupropion like Wellbutrin  This medicine may also interact with the following medications:  -alcohol  -certain medicines for anxiety or sleep  -certain medicines for blood pressure like metoprolol, propranolol  -certain medicines for depression or psychotic disturbances  -certain medicines for HIV or AIDS like efavirenz, lopinavir, nelfinavir, ritonavir  -certain medicines for irregular heart beat like propafenone, flecainide  -certain medicines for Parkinson's disease like amantadine, levodopa  -certain medicines for seizures like carbamazepine, phenytoin, phenobarbital  -cimetidine  -clopidogrel  -cyclophosphamide  -digoxin  -furazolidone  -isoniazid  -nicotine  -orphenadrine  -procarbazine  -steroid medicines like prednisone or cortisone  -stimulant medicines for attention disorders, weight loss, or to stay awake  -tamoxifen  -theophylline  -thiotepa  -ticlopidine  -tramadol  -warfarin  This list may not describe all possible interactions. Give your health care provider a list   patient support program. It is important to participate in a behavioral program, counseling, or other support program that is recommended by your health care professional. Patients and their families should watch out for new or worsening thoughts of suicide or depression. Also watch out for sudden changes in feelings such as feeling anxious, agitated, panicky, irritable, hostile, aggressive, impulsive, severely restless, overly excited and hyperactive, or not being able to sleep. If this happens, especially at the beginning of treatment or after a change in dose, call your health care professional. Avoid alcoholic drinks while taking this medicine. Drinking excessive alcoholic beverages, using sleeping or anxiety medicines, or quickly stopping the use of these agents while taking this medicine may increase your risk for a seizure. Do not drive or use heavy machinery until you know how this medicine affects you. This medicine can impair your ability to perform these tasks. Do not take this medicine close to bedtime. It may prevent you from sleeping. Your mouth may get dry. Chewing sugarless gum or sucking hard candy, and drinking plenty of water may help. Contact your doctor if the problem does not go away or is severe. Do not use nicotine patches or chewing gum without the advice of your doctor or health care professional while taking this medicine. You may need to have your blood pressure taken regularly if your doctor recommends that you use both nicotine and this medicine together. What side effects may I notice from receiving this medicine? Side effects that you should report to your doctor or health care professional as soon as possible: -allergic reactions like skin rash, itching or hives, swelling of the face, lips, or tongue -breathing problems -changes in vision -confusion -elevated mood, decreased need for sleep, racing thoughts, impulsive  behavior -fast or irregular heartbeat -hallucinations, loss of contact with reality -increased blood pressure -redness, blistering, peeling or loosening of the skin, including inside the mouth -seizures -suicidal thoughts or other mood changes -unusually weak or tired -vomiting Side effects that usually do not require medical attention (report to your doctor or health care professional if they continue or are bothersome): -constipation -headache -loss of appetite -nausea -tremors -weight loss This list may not describe all possible side effects. Call your doctor for medical advice about side effects. You may report side effects to FDA at 1-800-FDA-1088. Where should I keep my medicine? Keep out of the reach of children. Store at room temperature between 20 and 25 degrees C (68 and 77 degrees F). Protect from light. Keep container tightly closed. Throw away any unused medicine after the expiration date. NOTE: This sheet is a summary. It may not cover all possible information. If you have questions about this medicine, talk to your doctor, pharmacist, or health care provider.  2018 Elsevier/Gold Standard (2015-08-09 13:49:28)
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
## 2017-11-23 DIAGNOSIS — I130 Hypertensive heart and chronic kidney disease with heart failure and stage 1 through stage 4 chronic kidney disease, or unspecified chronic kidney disease: Secondary | ICD-10-CM | POA: Diagnosis not present

## 2017-11-23 DIAGNOSIS — J449 Chronic obstructive pulmonary disease, unspecified: Secondary | ICD-10-CM | POA: Diagnosis not present

## 2017-11-23 DIAGNOSIS — N183 Chronic kidney disease, stage 3 (moderate): Secondary | ICD-10-CM | POA: Diagnosis not present

## 2017-11-23 DIAGNOSIS — E1122 Type 2 diabetes mellitus with diabetic chronic kidney disease: Secondary | ICD-10-CM | POA: Diagnosis not present

## 2017-11-23 DIAGNOSIS — I5042 Chronic combined systolic (congestive) and diastolic (congestive) heart failure: Secondary | ICD-10-CM | POA: Diagnosis not present

## 2017-11-23 DIAGNOSIS — I481 Persistent atrial fibrillation: Secondary | ICD-10-CM | POA: Diagnosis not present

## 2017-11-24 ENCOUNTER — Encounter: Payer: Self-pay | Admitting: *Deleted

## 2017-11-24 DIAGNOSIS — I5042 Chronic combined systolic (congestive) and diastolic (congestive) heart failure: Secondary | ICD-10-CM | POA: Diagnosis not present

## 2017-11-24 DIAGNOSIS — I481 Persistent atrial fibrillation: Secondary | ICD-10-CM | POA: Diagnosis not present

## 2017-11-24 DIAGNOSIS — E1122 Type 2 diabetes mellitus with diabetic chronic kidney disease: Secondary | ICD-10-CM | POA: Diagnosis not present

## 2017-11-24 DIAGNOSIS — N183 Chronic kidney disease, stage 3 (moderate): Secondary | ICD-10-CM | POA: Diagnosis not present

## 2017-11-24 DIAGNOSIS — J449 Chronic obstructive pulmonary disease, unspecified: Secondary | ICD-10-CM | POA: Diagnosis not present

## 2017-11-24 DIAGNOSIS — I130 Hypertensive heart and chronic kidney disease with heart failure and stage 1 through stage 4 chronic kidney disease, or unspecified chronic kidney disease: Secondary | ICD-10-CM | POA: Diagnosis not present

## 2017-11-24 LAB — CUP PACEART INCLINIC DEVICE CHECK
Battery Impedance: 2563 Ohm
Battery Remaining Longevity: 5 mo
Brady Statistic AP VP Percent: 88 %
Brady Statistic AP VS Percent: 0 %
Implantable Lead Implant Date: 20130529
Implantable Lead Location: 753860
Implantable Lead Model: 4074
Implantable Lead Model: 4574
Implantable Pulse Generator Implant Date: 20130529
Lead Channel Impedance Value: 401 Ohm
Lead Channel Sensing Intrinsic Amplitude: 2 mV
Lead Channel Setting Pacing Amplitude: 1.75 V
Lead Channel Setting Pacing Amplitude: 4 V
Lead Channel Setting Pacing Pulse Width: 1.25 ms
MDC IDC LEAD IMPLANT DT: 20130529
MDC IDC LEAD LOCATION: 753859
MDC IDC MSMT BATTERY VOLTAGE: 2.65 V
MDC IDC MSMT LEADCHNL RA PACING THRESHOLD AMPLITUDE: 0.75 V
MDC IDC MSMT LEADCHNL RA PACING THRESHOLD PULSEWIDTH: 0.4 ms
MDC IDC MSMT LEADCHNL RV IMPEDANCE VALUE: 393 Ohm
MDC IDC MSMT LEADCHNL RV PACING THRESHOLD AMPLITUDE: 1.75 V
MDC IDC MSMT LEADCHNL RV PACING THRESHOLD PULSEWIDTH: 1.25 ms
MDC IDC SESS DTM: 20190910144215
MDC IDC SET LEADCHNL RV SENSING SENSITIVITY: 4 mV
MDC IDC STAT BRADY AS VP PERCENT: 12 %
MDC IDC STAT BRADY AS VS PERCENT: 0 %

## 2017-11-25 DIAGNOSIS — I5042 Chronic combined systolic (congestive) and diastolic (congestive) heart failure: Secondary | ICD-10-CM | POA: Diagnosis not present

## 2017-11-25 DIAGNOSIS — I481 Persistent atrial fibrillation: Secondary | ICD-10-CM | POA: Diagnosis not present

## 2017-11-25 DIAGNOSIS — J449 Chronic obstructive pulmonary disease, unspecified: Secondary | ICD-10-CM | POA: Diagnosis not present

## 2017-11-25 DIAGNOSIS — I130 Hypertensive heart and chronic kidney disease with heart failure and stage 1 through stage 4 chronic kidney disease, or unspecified chronic kidney disease: Secondary | ICD-10-CM | POA: Diagnosis not present

## 2017-11-25 DIAGNOSIS — N183 Chronic kidney disease, stage 3 (moderate): Secondary | ICD-10-CM | POA: Diagnosis not present

## 2017-11-25 DIAGNOSIS — E1122 Type 2 diabetes mellitus with diabetic chronic kidney disease: Secondary | ICD-10-CM | POA: Diagnosis not present

## 2017-11-30 ENCOUNTER — Other Ambulatory Visit (INDEPENDENT_AMBULATORY_CARE_PROVIDER_SITE_OTHER): Payer: Medicare Other

## 2017-11-30 DIAGNOSIS — I130 Hypertensive heart and chronic kidney disease with heart failure and stage 1 through stage 4 chronic kidney disease, or unspecified chronic kidney disease: Secondary | ICD-10-CM | POA: Diagnosis not present

## 2017-11-30 DIAGNOSIS — I5042 Chronic combined systolic (congestive) and diastolic (congestive) heart failure: Secondary | ICD-10-CM

## 2017-11-30 DIAGNOSIS — R001 Bradycardia, unspecified: Secondary | ICD-10-CM | POA: Diagnosis not present

## 2017-11-30 DIAGNOSIS — I481 Persistent atrial fibrillation: Secondary | ICD-10-CM | POA: Diagnosis not present

## 2017-11-30 DIAGNOSIS — N183 Chronic kidney disease, stage 3 (moderate): Secondary | ICD-10-CM | POA: Diagnosis not present

## 2017-11-30 DIAGNOSIS — Z01812 Encounter for preprocedural laboratory examination: Secondary | ICD-10-CM | POA: Diagnosis not present

## 2017-11-30 DIAGNOSIS — Z950 Presence of cardiac pacemaker: Secondary | ICD-10-CM | POA: Diagnosis not present

## 2017-11-30 DIAGNOSIS — E1122 Type 2 diabetes mellitus with diabetic chronic kidney disease: Secondary | ICD-10-CM | POA: Diagnosis not present

## 2017-11-30 DIAGNOSIS — J449 Chronic obstructive pulmonary disease, unspecified: Secondary | ICD-10-CM | POA: Diagnosis not present

## 2017-12-01 LAB — CBC WITH DIFFERENTIAL/PLATELET
BASOS ABS: 0.1 10*3/uL (ref 0.0–0.2)
Basos: 1 %
EOS (ABSOLUTE): 0.4 10*3/uL (ref 0.0–0.4)
Eos: 3 %
HEMOGLOBIN: 12.4 g/dL (ref 11.1–15.9)
Hematocrit: 37.9 % (ref 34.0–46.6)
Immature Grans (Abs): 0.1 10*3/uL (ref 0.0–0.1)
Immature Granulocytes: 1 %
LYMPHS ABS: 1.7 10*3/uL (ref 0.7–3.1)
LYMPHS: 16 %
MCH: 28.8 pg (ref 26.6–33.0)
MCHC: 32.7 g/dL (ref 31.5–35.7)
MCV: 88 fL (ref 79–97)
MONOCYTES: 8 %
Monocytes Absolute: 0.8 10*3/uL (ref 0.1–0.9)
Neutrophils Absolute: 7.4 10*3/uL — ABNORMAL HIGH (ref 1.4–7.0)
Neutrophils: 71 %
PLATELETS: 248 10*3/uL (ref 150–450)
RBC: 4.31 x10E6/uL (ref 3.77–5.28)
RDW: 14.9 % (ref 12.3–15.4)
WBC: 10.4 10*3/uL (ref 3.4–10.8)

## 2017-12-01 LAB — BASIC METABOLIC PANEL
BUN/Creatinine Ratio: 19 (ref 12–28)
BUN: 39 mg/dL — AB (ref 8–27)
CALCIUM: 9.3 mg/dL (ref 8.7–10.3)
CHLORIDE: 100 mmol/L (ref 96–106)
CO2: 26 mmol/L (ref 20–29)
CREATININE: 2.01 mg/dL — AB (ref 0.57–1.00)
GFR calc Af Amer: 26 mL/min/{1.73_m2} — ABNORMAL LOW (ref >59)
GFR calc non Af Amer: 23 mL/min/{1.73_m2} — ABNORMAL LOW (ref >59)
GLUCOSE: 170 mg/dL — AB (ref 65–99)
Potassium: 4.4 mmol/L (ref 3.5–5.2)
Sodium: 141 mmol/L (ref 134–144)

## 2017-12-01 NOTE — Telephone Encounter (Signed)
-----   Message from Will Meredith Leeds, MD sent at 12/01/2017  8:31 AM EDT ----- creatinie mildly elevated from baseline pre CRT-D upgrade. Plan per Caryl Comes.

## 2017-12-01 NOTE — Telephone Encounter (Signed)
Spoke with pt's daughter, [name], regarding latest lab results. Pt's Creatinine is elevated from last BMP draw. Pt's daughter states her mother's lasix was increased recently after her last visit with the heart failure clinic. I advised [name] I would sent this message to [facility] triage and Dr Bensimhon's RN to contact her regarding lasix increase. [name] agrees with this plan and will call the [facility] as well.

## 2017-12-01 NOTE — Telephone Encounter (Signed)
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

## 2017-12-02 DIAGNOSIS — J449 Chronic obstructive pulmonary disease, unspecified: Secondary | ICD-10-CM | POA: Diagnosis not present

## 2017-12-02 DIAGNOSIS — I481 Persistent atrial fibrillation: Secondary | ICD-10-CM | POA: Diagnosis not present

## 2017-12-02 DIAGNOSIS — I5042 Chronic combined systolic (congestive) and diastolic (congestive) heart failure: Secondary | ICD-10-CM | POA: Diagnosis not present

## 2017-12-02 DIAGNOSIS — E1122 Type 2 diabetes mellitus with diabetic chronic kidney disease: Secondary | ICD-10-CM | POA: Diagnosis not present

## 2017-12-02 DIAGNOSIS — I130 Hypertensive heart and chronic kidney disease with heart failure and stage 1 through stage 4 chronic kidney disease, or unspecified chronic kidney disease: Secondary | ICD-10-CM | POA: Diagnosis not present

## 2017-12-02 DIAGNOSIS — N183 Chronic kidney disease, stage 3 (moderate): Secondary | ICD-10-CM | POA: Diagnosis not present

## 2017-12-07 DIAGNOSIS — I481 Persistent atrial fibrillation: Secondary | ICD-10-CM | POA: Diagnosis not present

## 2017-12-07 DIAGNOSIS — J449 Chronic obstructive pulmonary disease, unspecified: Secondary | ICD-10-CM | POA: Diagnosis not present

## 2017-12-07 DIAGNOSIS — I130 Hypertensive heart and chronic kidney disease with heart failure and stage 1 through stage 4 chronic kidney disease, or unspecified chronic kidney disease: Secondary | ICD-10-CM | POA: Diagnosis not present

## 2017-12-07 DIAGNOSIS — I5042 Chronic combined systolic (congestive) and diastolic (congestive) heart failure: Secondary | ICD-10-CM | POA: Diagnosis not present

## 2017-12-07 DIAGNOSIS — E1122 Type 2 diabetes mellitus with diabetic chronic kidney disease: Secondary | ICD-10-CM | POA: Diagnosis not present

## 2017-12-07 DIAGNOSIS — N183 Chronic kidney disease, stage 3 (moderate): Secondary | ICD-10-CM | POA: Diagnosis not present

## 2017-12-08 ENCOUNTER — Encounter (HOSPITAL_COMMUNITY): Payer: Self-pay | Admitting: *Deleted

## 2017-12-08 ENCOUNTER — Ambulatory Visit (HOSPITAL_COMMUNITY)
Admission: RE | Admit: 2017-12-08 | Discharge: 2017-12-09 | Disposition: A | Discharge status: Home / Self Care | Payer: Medicare Other | Source: Ambulatory Visit | Attending: Internal Medicine | Admitting: Internal Medicine

## 2017-12-08 ENCOUNTER — Other Ambulatory Visit: Payer: Self-pay

## 2017-12-08 ENCOUNTER — Encounter (HOSPITAL_COMMUNITY)
Admission: RE | Disposition: A | Discharge status: Home / Self Care | Payer: Self-pay | Source: Ambulatory Visit | Attending: Internal Medicine

## 2017-12-08 DIAGNOSIS — Z79899 Other long term (current) drug therapy: Secondary | ICD-10-CM | POA: Insufficient documentation

## 2017-12-08 DIAGNOSIS — I442 Atrioventricular block, complete: Secondary | ICD-10-CM | POA: Diagnosis present

## 2017-12-08 DIAGNOSIS — H9190 Unspecified hearing loss, unspecified ear: Secondary | ICD-10-CM | POA: Insufficient documentation

## 2017-12-08 DIAGNOSIS — E785 Hyperlipidemia, unspecified: Secondary | ICD-10-CM | POA: Insufficient documentation

## 2017-12-08 DIAGNOSIS — K219 Gastro-esophageal reflux disease without esophagitis: Secondary | ICD-10-CM | POA: Insufficient documentation

## 2017-12-08 DIAGNOSIS — I2510 Atherosclerotic heart disease of native coronary artery without angina pectoris: Secondary | ICD-10-CM | POA: Diagnosis not present

## 2017-12-08 DIAGNOSIS — F419 Anxiety disorder, unspecified: Secondary | ICD-10-CM | POA: Insufficient documentation

## 2017-12-08 DIAGNOSIS — Z951 Presence of aortocoronary bypass graft: Secondary | ICD-10-CM | POA: Insufficient documentation

## 2017-12-08 DIAGNOSIS — N183 Chronic kidney disease, stage 3 (moderate): Secondary | ICD-10-CM | POA: Diagnosis not present

## 2017-12-08 DIAGNOSIS — I130 Hypertensive heart and chronic kidney disease with heart failure and stage 1 through stage 4 chronic kidney disease, or unspecified chronic kidney disease: Secondary | ICD-10-CM | POA: Insufficient documentation

## 2017-12-08 DIAGNOSIS — Z90710 Acquired absence of both cervix and uterus: Secondary | ICD-10-CM | POA: Insufficient documentation

## 2017-12-08 DIAGNOSIS — F329 Major depressive disorder, single episode, unspecified: Secondary | ICD-10-CM | POA: Insufficient documentation

## 2017-12-08 DIAGNOSIS — Z87828 Personal history of other (healed) physical injury and trauma: Secondary | ICD-10-CM | POA: Insufficient documentation

## 2017-12-08 DIAGNOSIS — Z886 Allergy status to analgesic agent status: Secondary | ICD-10-CM | POA: Diagnosis not present

## 2017-12-08 DIAGNOSIS — E1022 Type 1 diabetes mellitus with diabetic chronic kidney disease: Secondary | ICD-10-CM | POA: Diagnosis not present

## 2017-12-08 DIAGNOSIS — Z888 Allergy status to other drugs, medicaments and biological substances status: Secondary | ICD-10-CM | POA: Diagnosis not present

## 2017-12-08 DIAGNOSIS — Z96641 Presence of right artificial hip joint: Secondary | ICD-10-CM | POA: Insufficient documentation

## 2017-12-08 DIAGNOSIS — I5042 Chronic combined systolic (congestive) and diastolic (congestive) heart failure: Secondary | ICD-10-CM | POA: Diagnosis not present

## 2017-12-08 DIAGNOSIS — Z9889 Other specified postprocedural states: Secondary | ICD-10-CM | POA: Insufficient documentation

## 2017-12-08 DIAGNOSIS — Z7901 Long term (current) use of anticoagulants: Secondary | ICD-10-CM | POA: Insufficient documentation

## 2017-12-08 DIAGNOSIS — Z794 Long term (current) use of insulin: Secondary | ICD-10-CM | POA: Diagnosis not present

## 2017-12-08 DIAGNOSIS — E876 Hypokalemia: Secondary | ICD-10-CM | POA: Insufficient documentation

## 2017-12-08 DIAGNOSIS — J449 Chronic obstructive pulmonary disease, unspecified: Secondary | ICD-10-CM | POA: Insufficient documentation

## 2017-12-08 DIAGNOSIS — M1990 Unspecified osteoarthritis, unspecified site: Secondary | ICD-10-CM | POA: Diagnosis not present

## 2017-12-08 DIAGNOSIS — I509 Heart failure, unspecified: Secondary | ICD-10-CM

## 2017-12-08 DIAGNOSIS — I4819 Other persistent atrial fibrillation: Secondary | ICD-10-CM | POA: Diagnosis present

## 2017-12-08 DIAGNOSIS — Z959 Presence of cardiac and vascular implant and graft, unspecified: Secondary | ICD-10-CM

## 2017-12-08 DIAGNOSIS — Z9049 Acquired absence of other specified parts of digestive tract: Secondary | ICD-10-CM | POA: Diagnosis not present

## 2017-12-08 HISTORY — PX: BIV UPGRADE: EP1202

## 2017-12-08 LAB — BASIC METABOLIC PANEL
Anion gap: 13 (ref 5–15)
BUN: 29 mg/dL — ABNORMAL HIGH (ref 8–23)
CALCIUM: 8.5 mg/dL — AB (ref 8.9–10.3)
CO2: 22 mmol/L (ref 22–32)
Chloride: 108 mmol/L (ref 98–111)
Creatinine, Ser: 1.62 mg/dL — ABNORMAL HIGH (ref 0.44–1.00)
GFR calc non Af Amer: 29 mL/min — ABNORMAL LOW (ref >60)
GFR, EST AFRICAN AMERICAN: 33 mL/min — AB (ref >60)
Glucose, Bld: 97 mg/dL (ref 70–99)
Potassium: 3.6 mmol/L (ref 3.5–5.1)
SODIUM: 143 mmol/L (ref 135–145)

## 2017-12-08 LAB — SURGICAL PCR SCREEN
MRSA, PCR: NEGATIVE
Staphylococcus aureus: NEGATIVE

## 2017-12-08 LAB — POCT I-STAT, CHEM 8
BUN: 33 mg/dL — ABNORMAL HIGH (ref 8–23)
Calcium, Ion: 1.12 mmol/L — ABNORMAL LOW (ref 1.15–1.40)
Chloride: 107 mmol/L (ref 98–111)
Creatinine, Ser: 1.7 mg/dL — ABNORMAL HIGH (ref 0.44–1.00)
Glucose, Bld: 123 mg/dL — ABNORMAL HIGH (ref 70–99)
HEMATOCRIT: 32 % — AB (ref 36.0–46.0)
HEMOGLOBIN: 10.9 g/dL — AB (ref 12.0–15.0)
POTASSIUM: 3.3 mmol/L — AB (ref 3.5–5.1)
SODIUM: 144 mmol/L (ref 135–145)
TCO2: 24 mmol/L (ref 22–32)

## 2017-12-08 SURGERY — BIV UPGRADE

## 2017-12-08 MED ORDER — MIDAZOLAM HCL 5 MG/5ML IJ SOLN
INTRAMUSCULAR | Status: AC
  Filled 2017-12-08: qty 5

## 2017-12-08 MED ORDER — FLUTICASONE PROPIONATE 50 MCG/ACT NA SUSP: 1.0000 | Freq: Every day | NASAL | Status: DC | PRN

## 2017-12-08 MED ORDER — POTASSIUM CHLORIDE CRYS ER 20 MEQ PO TBCR: 40.0000 meq | EXTENDED_RELEASE_TABLET | Freq: Once | ORAL | Status: DC

## 2017-12-08 MED ORDER — GUAIFENESIN-DM 100-10 MG/5ML PO SYRP: 5.0000 mL | ORAL_SOLUTION | ORAL | Status: DC | PRN

## 2017-12-08 MED ORDER — VITAMIN D 1000 UNITS PO TABS
1000.0000 [IU] | ORAL_TABLET | Freq: Every day | ORAL | Status: DC
  Administered 2017-12-09: 1000 [IU] via ORAL
  Filled 2017-12-08: qty 1

## 2017-12-08 MED ORDER — ALBUTEROL SULFATE (2.5 MG/3ML) 0.083% IN NEBU
INHALATION_SOLUTION | RESPIRATORY_TRACT | Status: AC
  Filled 2017-12-08: qty 3

## 2017-12-08 MED ORDER — ISOSORBIDE MONONITRATE ER 60 MG PO TB24
120.0000 mg | ORAL_TABLET | Freq: Every day | ORAL | Status: DC
  Administered 2017-12-09: 120 mg via ORAL
  Filled 2017-12-08: qty 2

## 2017-12-08 MED ORDER — AMIODARONE HCL 200 MG PO TABS
200.0000 mg | ORAL_TABLET | Freq: Every day | ORAL | Status: DC
  Administered 2017-12-09: 200 mg via ORAL
  Filled 2017-12-08: qty 1

## 2017-12-08 MED ORDER — ACETAMINOPHEN 325 MG PO TABS: 325.0000 mg | ORAL_TABLET | ORAL | Status: DC | PRN

## 2017-12-08 MED ORDER — LEVALBUTEROL HCL 0.63 MG/3ML IN NEBU
0.6300 mg | INHALATION_SOLUTION | RESPIRATORY_TRACT | Status: AC
  Administered 2017-12-08: 0.63 mg via RESPIRATORY_TRACT
  Filled 2017-12-08: qty 3

## 2017-12-08 MED ORDER — POTASSIUM CHLORIDE 10 MEQ/100ML IV SOLN
INTRAVENOUS | Status: AC
  Filled 2017-12-08: qty 100

## 2017-12-08 MED ORDER — IOPAMIDOL (ISOVUE-370) INJECTION 76%
INTRAVENOUS | Status: DC | PRN
  Administered 2017-12-08: 3 mL via INTRAVENOUS

## 2017-12-08 MED ORDER — POTASSIUM CHLORIDE 10 MEQ/100ML IV SOLN
INTRAVENOUS | Status: DC | PRN
  Administered 2017-12-08: 10 meq via INTRAVENOUS

## 2017-12-08 MED ORDER — SODIUM CHLORIDE 0.9 % IV SOLN
INTRAVENOUS | Status: AC
  Administered 2017-12-08: 15:00:00 via INTRAVENOUS

## 2017-12-08 MED ORDER — FUROSEMIDE 10 MG/ML IJ SOLN
INTRAMUSCULAR | Status: AC
  Filled 2017-12-08: qty 4

## 2017-12-08 MED ORDER — ONDANSETRON HCL 4 MG/2ML IJ SOLN: 4.0000 mg | Freq: Four times a day (QID) | INTRAMUSCULAR | Status: DC | PRN

## 2017-12-08 MED ORDER — CEFAZOLIN SODIUM-DEXTROSE 1-4 GM/50ML-% IV SOLN
1.0000 g | Freq: Four times a day (QID) | INTRAVENOUS | Status: AC
  Administered 2017-12-08 – 2017-12-09 (×2): 1 g via INTRAVENOUS
  Filled 2017-12-08 (×3): qty 50

## 2017-12-08 MED ORDER — PSYLLIUM 0.52 G PO CAPS: 0.5200 g | ORAL_CAPSULE | Freq: Every day | ORAL | Status: DC

## 2017-12-08 MED ORDER — FENTANYL CITRATE (PF) 100 MCG/2ML IJ SOLN
INTRAMUSCULAR | Status: AC
  Filled 2017-12-08: qty 2

## 2017-12-08 MED ORDER — SODIUM CHLORIDE 0.9 % IV SOLN: INTRAVENOUS | Status: DC

## 2017-12-08 MED ORDER — IPRATROPIUM-ALBUTEROL 0.5-2.5 (3) MG/3ML IN SOLN: 3.0000 mL | Freq: Four times a day (QID) | RESPIRATORY_TRACT | Status: DC | PRN

## 2017-12-08 MED ORDER — LORAZEPAM 0.5 MG PO TABS: 0.5000 mg | ORAL_TABLET | Freq: Every day | ORAL | Status: DC | PRN

## 2017-12-08 MED ORDER — HYDRALAZINE HCL 25 MG PO TABS
25.0000 mg | ORAL_TABLET | Freq: Three times a day (TID) | ORAL | Status: DC
  Administered 2017-12-08 – 2017-12-09 (×2): 25 mg via ORAL
  Filled 2017-12-08 (×2): qty 1

## 2017-12-08 MED ORDER — MIDAZOLAM HCL 5 MG/5ML IJ SOLN
INTRAMUSCULAR | Status: DC | PRN
  Administered 2017-12-08: 0.5 mg via INTRAVENOUS
  Administered 2017-12-08 (×3): 1 mg via INTRAVENOUS

## 2017-12-08 MED ORDER — FENTANYL CITRATE (PF) 100 MCG/2ML IJ SOLN
INTRAMUSCULAR | Status: DC | PRN
  Administered 2017-12-08: 12.5 ug via INTRAVENOUS
  Administered 2017-12-08 (×2): 25 ug via INTRAVENOUS
  Administered 2017-12-08: 12.5 ug via INTRAVENOUS

## 2017-12-08 MED ORDER — PSYLLIUM 95 % PO PACK
1.0000 | PACK | Freq: Every day | ORAL | Status: DC
  Administered 2017-12-09: 1 via ORAL
  Filled 2017-12-08: qty 1

## 2017-12-08 MED ORDER — DIPHENHYDRAMINE HCL 50 MG/ML IJ SOLN
INTRAMUSCULAR | Status: AC
  Filled 2017-12-08: qty 1

## 2017-12-08 MED ORDER — DIPHENHYDRAMINE HCL 50 MG/ML IJ SOLN
INTRAMUSCULAR | Status: DC | PRN
  Administered 2017-12-08: 25 mg via INTRAVENOUS

## 2017-12-08 MED ORDER — SODIUM CHLORIDE 0.9 % IV SOLN
INTRAVENOUS | Status: AC
  Filled 2017-12-08: qty 2

## 2017-12-08 MED ORDER — AMLODIPINE BESYLATE 10 MG PO TABS
10.0000 mg | ORAL_TABLET | Freq: Every day | ORAL | Status: DC
  Administered 2017-12-09: 10 mg via ORAL
  Filled 2017-12-08: qty 1

## 2017-12-08 MED ORDER — CEFAZOLIN SODIUM-DEXTROSE 2-4 GM/100ML-% IV SOLN
INTRAVENOUS | Status: AC
  Filled 2017-12-08: qty 100

## 2017-12-08 MED ORDER — FUROSEMIDE 10 MG/ML IJ SOLN
INTRAMUSCULAR | Status: DC | PRN
  Administered 2017-12-08: 40 mg via INTRAVENOUS

## 2017-12-08 MED ORDER — POTASSIUM CHLORIDE CRYS ER 20 MEQ PO TBCR
EXTENDED_RELEASE_TABLET | ORAL | Status: AC
  Filled 2017-12-08: qty 2

## 2017-12-08 MED ORDER — SODIUM CHLORIDE 0.9 % IV SOLN
80.0000 mg | INTRAVENOUS | Status: AC
  Administered 2017-12-08: 80 mg
  Filled 2017-12-08: qty 2

## 2017-12-08 MED ORDER — CEFAZOLIN SODIUM-DEXTROSE 2-4 GM/100ML-% IV SOLN
2.0000 g | INTRAVENOUS | Status: AC
  Administered 2017-12-08: 2 g via INTRAVENOUS
  Filled 2017-12-08: qty 100

## 2017-12-08 MED ORDER — MUPIROCIN 2 % EX OINT
1.0000 "application " | TOPICAL_OINTMENT | Freq: Once | CUTANEOUS | Status: AC
  Administered 2017-12-08: 1 via TOPICAL
  Filled 2017-12-08: qty 22

## 2017-12-08 MED ORDER — HEPARIN (PORCINE) IN NACL 1000-0.9 UT/500ML-% IV SOLN
INTRAVENOUS | Status: DC | PRN
  Administered 2017-12-08 (×2): 500 mL

## 2017-12-08 MED ORDER — OXYBUTYNIN CHLORIDE ER 5 MG PO TB24
5.0000 mg | ORAL_TABLET | Freq: Every day | ORAL | Status: DC
  Administered 2017-12-08: 5 mg via ORAL
  Filled 2017-12-08: qty 1

## 2017-12-08 MED ORDER — BUPROPION HCL 75 MG PO TABS
75.0000 mg | ORAL_TABLET | Freq: Two times a day (BID) | ORAL | Status: DC
  Administered 2017-12-08 – 2017-12-09 (×2): 75 mg via ORAL
  Filled 2017-12-08 (×2): qty 1

## 2017-12-08 MED ORDER — HYDROCODONE-ACETAMINOPHEN 7.5-325 MG PO TABS
1.0000 | ORAL_TABLET | Freq: Two times a day (BID) | ORAL | Status: DC | PRN
  Administered 2017-12-08: 1 via ORAL
  Filled 2017-12-08: qty 1

## 2017-12-08 MED ORDER — MUPIROCIN 2 % EX OINT
TOPICAL_OINTMENT | CUTANEOUS | Status: AC
  Filled 2017-12-08: qty 22

## 2017-12-08 MED ORDER — METOPROLOL SUCCINATE ER 25 MG PO TB24
25.0000 mg | ORAL_TABLET | Freq: Every day | ORAL | Status: DC
  Administered 2017-12-09: 25 mg via ORAL
  Filled 2017-12-08: qty 1

## 2017-12-08 MED ORDER — POTASSIUM CHLORIDE 10 MEQ/50ML IV SOLN
10.0000 meq | Freq: Once | INTRAVENOUS | Status: DC
  Filled 2017-12-08: qty 50

## 2017-12-08 MED ORDER — CHLORHEXIDINE GLUCONATE 4 % EX LIQD
60.0000 mL | Freq: Once | CUTANEOUS | Status: DC
  Filled 2017-12-08: qty 60

## 2017-12-08 MED ORDER — FUROSEMIDE 10 MG/ML IJ SOLN
40.0000 mg | Freq: Once | INTRAMUSCULAR | Status: AC
  Administered 2017-12-09: 40 mg via INTRAVENOUS
  Filled 2017-12-08: qty 4

## 2017-12-08 MED ORDER — FUROSEMIDE 40 MG PO TABS: 40.0000 mg | ORAL_TABLET | ORAL | Status: DC

## 2017-12-08 MED ORDER — LIDOCAINE HCL (PF) 1 % IJ SOLN
INTRAMUSCULAR | Status: DC | PRN
  Administered 2017-12-08: 60 mL

## 2017-12-08 MED ORDER — LIDOCAINE HCL (PF) 1 % IJ SOLN
INTRAMUSCULAR | Status: AC
  Filled 2017-12-08: qty 60

## 2017-12-08 MED ORDER — DOCUSATE SODIUM 100 MG PO CAPS
100.0000 mg | ORAL_CAPSULE | Freq: Two times a day (BID) | ORAL | Status: DC
  Administered 2017-12-08 – 2017-12-09 (×2): 100 mg via ORAL
  Filled 2017-12-08 (×2): qty 1

## 2017-12-08 MED ORDER — POTASSIUM CHLORIDE CRYS ER 20 MEQ PO TBCR
40.0000 meq | EXTENDED_RELEASE_TABLET | Freq: Once | ORAL | Status: AC
  Administered 2017-12-08: 40 meq via ORAL

## 2017-12-08 MED ORDER — POTASSIUM CHLORIDE 10 MEQ/100ML IV SOLN
10.0000 meq | Freq: Once | INTRAVENOUS | Status: DC
  Filled 2017-12-08: qty 100

## 2017-12-08 SURGICAL SUPPLY — 26 items
ADAPTER SEALING SSSA-09 (ADAPTER) ×3 IMPLANT
CABLE SURGICAL S-101-97-12 (CABLE) ×3 IMPLANT
CATH ATTAIN COM SURV 6250V-AM (CATHETERS) ×3 IMPLANT
CATH ATTAIN COM SURV 6250V-MB2 (CATHETERS) ×3 IMPLANT
CATH ATTAIN SEL SURV 6248V-90 (CATHETERS) ×3 IMPLANT
CATH CPS DIRECT 135 DS2C020 (CATHETERS) ×3 IMPLANT
CATH HEXAPOLAR DAMATO 6F (CATHETERS) ×3 IMPLANT
CATH NAVICROSS ST 65CM (CATHETERS) ×1 IMPLANT
CATH OCTAPOLOR 6F 125CM 2-5-2 (CATHETERS) ×3 IMPLANT
CATH RIGHTSITE C315HIS02 (CATHETERS) ×3 IMPLANT
CATHETER NAVICROSS ST 65CM (CATHETERS) ×3
DEVICE TORQUE .025-.038 (MISCELLANEOUS) ×3 IMPLANT
HEMOSTAT SURGICEL 2X4 FIBR (HEMOSTASIS) ×6 IMPLANT
KIT ESSENTIALS PG (KITS) ×3 IMPLANT
KIT MICROPUNCTURE NIT STIFF (SHEATH) ×3 IMPLANT
LEAD SELECT SECURE 3830 383069 (Lead) ×1 IMPLANT
PACEMAKER PRCT MRI CRTP W1TR01 (Pacemaker) ×1 IMPLANT
PAD PRO RADIOLUCENT 2001M-C (PAD) ×3 IMPLANT
PPM PRECEPTA MRI CRT-P W1TR01 (Pacemaker) ×3 IMPLANT
SELECT SECURE 3830 383069 (Lead) ×3 IMPLANT
SHEATH CLASSIC 8F (SHEATH) ×3 IMPLANT
SHEATH CLASSIC 9.5F (SHEATH) ×3 IMPLANT
SLITTER 6232ADJ (MISCELLANEOUS) ×3 IMPLANT
TRAY PACEMAKER INSERTION (PACKS) ×3 IMPLANT
WIRE AQUATRAK .035X150 ANG (WIRE) ×3 IMPLANT
WIRE HI TORQ VERSACORE-J 145CM (WIRE) ×6 IMPLANT

## 2017-12-08 NOTE — Interval H&P Note (Signed)
History and Physical Interval Note:  12/08/2017 8:14 AM  Wanda Brooks  has presented today for surgery, with the diagnosis of hf - bradicardia  The various methods of treatment have been discussed with the patient and family. After consideration of risks, benefits and other options for treatment, the patient has consented to  Procedure(s): BIVP UPGRADE (N/A) as a surgical intervention .  The patient's history has been reviewed, patient examined, no change in status, stable for surgery.  I have reviewed the patient's chart and labs.  Questions were answered to the patient's satisfaction.     Virl Axe  Daughter held her lasix x 4 days which was followed by worsening dyspnea and with resumption interval improvement.  C/o cough with difficult to clear secretions.  No edema  Exam notable for wheezing  Will give neb treatment and IV lasix

## 2017-12-08 NOTE — Interval H&P Note (Signed)
History and Physical Interval Note:  12/08/2017 8:03 AM  Wanda Brooks  has presented today for surgery, with the diagnosis of hf - bradicardia  The various methods of treatment have been discussed with the patient and family. After consideration of risks, benefits and other options for treatment, the patient has consented to  Procedure(s): BIVP UPGRADE (N/A) as a surgical intervention .  The patient's history has been reviewed, patient examined, no change in status, stable for surgery.  I have reviewed the patient's chart and labs.  Questions were answered to the patient's satisfaction.     Virl Axe  Interval hospitalization for heart failure seen and admitted and treated with IV lasix  F/U visit with PCP describes much improvement but assoc with elevated Cr 1.4>>2.0-- will need to recheck today prior to proceeding

## 2017-12-08 NOTE — Progress Notes (Signed)
Cr better 1.7  K a little low Will proceed  Minimize dye K 40 po x 1

## 2017-12-09 ENCOUNTER — Other Ambulatory Visit: Payer: Self-pay | Admitting: Physician Assistant

## 2017-12-09 ENCOUNTER — Ambulatory Visit (HOSPITAL_COMMUNITY): Payer: Medicare Other

## 2017-12-09 DIAGNOSIS — I442 Atrioventricular block, complete: Secondary | ICD-10-CM | POA: Diagnosis not present

## 2017-12-09 DIAGNOSIS — N183 Chronic kidney disease, stage 3 unspecified: Secondary | ICD-10-CM

## 2017-12-09 DIAGNOSIS — E1022 Type 1 diabetes mellitus with diabetic chronic kidney disease: Secondary | ICD-10-CM | POA: Diagnosis not present

## 2017-12-09 DIAGNOSIS — M1990 Unspecified osteoarthritis, unspecified site: Secondary | ICD-10-CM | POA: Diagnosis not present

## 2017-12-09 DIAGNOSIS — J90 Pleural effusion, not elsewhere classified: Secondary | ICD-10-CM | POA: Diagnosis not present

## 2017-12-09 DIAGNOSIS — I2510 Atherosclerotic heart disease of native coronary artery without angina pectoris: Secondary | ICD-10-CM | POA: Diagnosis not present

## 2017-12-09 DIAGNOSIS — H9190 Unspecified hearing loss, unspecified ear: Secondary | ICD-10-CM | POA: Diagnosis not present

## 2017-12-09 DIAGNOSIS — E785 Hyperlipidemia, unspecified: Secondary | ICD-10-CM | POA: Diagnosis not present

## 2017-12-09 DIAGNOSIS — I130 Hypertensive heart and chronic kidney disease with heart failure and stage 1 through stage 4 chronic kidney disease, or unspecified chronic kidney disease: Secondary | ICD-10-CM | POA: Diagnosis not present

## 2017-12-09 DIAGNOSIS — I5042 Chronic combined systolic (congestive) and diastolic (congestive) heart failure: Secondary | ICD-10-CM | POA: Diagnosis not present

## 2017-12-09 DIAGNOSIS — E876 Hypokalemia: Secondary | ICD-10-CM | POA: Diagnosis not present

## 2017-12-09 DIAGNOSIS — Z950 Presence of cardiac pacemaker: Secondary | ICD-10-CM | POA: Diagnosis not present

## 2017-12-09 DIAGNOSIS — K219 Gastro-esophageal reflux disease without esophagitis: Secondary | ICD-10-CM | POA: Diagnosis not present

## 2017-12-09 DIAGNOSIS — J449 Chronic obstructive pulmonary disease, unspecified: Secondary | ICD-10-CM | POA: Diagnosis not present

## 2017-12-09 LAB — BASIC METABOLIC PANEL
ANION GAP: 11 (ref 5–15)
BUN: 29 mg/dL — ABNORMAL HIGH (ref 8–23)
CALCIUM: 8.4 mg/dL — AB (ref 8.9–10.3)
CO2: 24 mmol/L (ref 22–32)
Chloride: 106 mmol/L (ref 98–111)
Creatinine, Ser: 1.63 mg/dL — ABNORMAL HIGH (ref 0.44–1.00)
GFR, EST AFRICAN AMERICAN: 33 mL/min — AB (ref >60)
GFR, EST NON AFRICAN AMERICAN: 29 mL/min — AB (ref >60)
Glucose, Bld: 111 mg/dL — ABNORMAL HIGH (ref 70–99)
Potassium: 3.2 mmol/L — ABNORMAL LOW (ref 3.5–5.1)
SODIUM: 141 mmol/L (ref 135–145)

## 2017-12-09 MED ORDER — POTASSIUM CHLORIDE CRYS ER 20 MEQ PO TBCR
40.0000 meq | EXTENDED_RELEASE_TABLET | Freq: Once | ORAL | Status: AC
  Administered 2017-12-09: 40 meq via ORAL
  Filled 2017-12-09: qty 2

## 2017-12-09 NOTE — Discharge Instructions (Signed)
° ° °  Supplemental Discharge Instructions for  Pacemaker/Defibrillator Patients  Activity No heavy lifting or vigorous activity with your left/right arm for 6 to 8 weeks.  Do not raise your left/right arm above your head for one week.  Gradually raise your affected arm as drawn below.             12/12/17                   12/13/17                   12/14/17                 12/15/17 __  NO DRIVING the patient does not drive  WOUND CARE - Keep the wound area clean and dry.  Do not get this area wet for one week. No showers for one week; you may shower on  12/15/17 . - The tape/steri-strips on your wound will fall off; do not pull them off.  No bandage is needed on the site.  DO  NOT apply any creams, oils, or ointments to the wound area. - If you notice any drainage or discharge from the wound, any swelling or bruising at the site, or you develop a fever > 101? F after you are discharged home, call the office at once.  Special Instructions - You are still able to use cellular telephones; use the ear opposite the side where you have your pacemaker/defibrillator.  Avoid carrying your cellular phone near your device. - When traveling through airports, show security personnel your identification card to avoid being screened in the metal detectors.  Ask the security personnel to use the hand wand. - Avoid arc welding equipment, MRI testing (magnetic resonance imaging), TENS units (transcutaneous nerve stimulators).  Call the office for questions about other devices. - Avoid electrical appliances that are in poor condition or are not properly grounded. - Microwave ovens are safe to be near or to operate.  Additional information for defibrillator patients should your device go off: - If your device goes off ONCE and you feel fine afterward, notify the device clinic nurses. - If your device goes off ONCE and you do not feel well afterward, call 911. - If your device goes off TWICE, call 911. - If  your device goes off THREE times in one day, call 911.  DO NOT DRIVE YOURSELF OR A FAMILY MEMBER WITH A DEFIBRILLATOR TO THE HOSPITAL--CALL 911.

## 2017-12-09 NOTE — Discharge Summary (Signed)
ELECTROPHYSIOLOGY PROCEDURE DISCHARGE SUMMARY    Patient ID: Wanda Brooks,  MRN: [id number], DOB/AGE: 1935/08/05 82 y.o.  Admit date: 12/08/2017 Discharge date: 12/09/2017  Primary Care Physician: Jearld Fenton, NP Primary Cardiologist: Dr. Haroldine Laws Electrophysiologist: Dr. Caryl Comes  Primary Discharge Diagnosis:  1. Chronic CHF (combined)     Suspect worsened 2/2 RV pacing  Secondary Discharge Diagnosis:  1. Persistent AFib     CHA2DS2Vasc is 6, on Eliquis appropriately dosed for age/creat 2. CAD h/o CABG 3. VHD w/severe MR  4. COPD 5. Symptomatic bradycardia w/PPM  Allergies  Allergen Reactions  . Dronedarone Diarrhea  . Aspirin Other (See Comments)    Stomach burning, Can only take coated  . Daypro [Oxaprozin] Other (See Comments)    Dizziness, Affects driving     Procedures This Admission:  1.  Implantation of a MDT  HID lead and generator change( PPM) on 12/08/17 by Dr Chipper Oman.  The patient received HIS lead was a [id number] serial number [id number], Medtronic Precepta pulse generator, serial number [id number].  There were no immediate post procedure complications. 2.  CXR on 12/09/17 demonstrated no pneumothorax status post device implantation.   Brief HPI: Wanda Brooks is a 82 y.o. female is followed in the outpatient setting by EP service.  Past medical history includes above.  Worsening/difficult management of CHF suspect 2/2 her RV pacing, recommended device upgrade to CRT pacer.  Risks benefits to planned procedure were discussed with her and she was agreeable to proceed   Hospital Course:  The patient was admitted and underwent implantation of a HIS lead, after unsuccessful attempts AT lV lead placement, see full report for details.  She was monitored on telemetry overnight which demonstrated AV paced rhythm.  Left chest was without hematoma or ecchymosis.  The device was interrogated and found to be functioning normally.  CXR was obtained and demonstrated no  pneumothorax status post device implantation.   She was given IV lasix she reports with good urine output/response as well as potassium supplementation this morning   She does not feel SOB at rest.  Wound care, arm mobility, and restrictions were reviewed with the patient.  The patient feels well this morning without CP or SOB, minimal site discomfort, she was examined by Dr. Caryl Comes and considered stable for discharge to home.    Physical Exam: Vitals:   12/09/17 0418 12/09/17 0745 12/09/17 0857 12/09/17 1115  BP: (!) 172/52 (!) 167/51 (!) 117/107 (!) 148/55  Pulse:  60  60  Resp: 20 12    Temp: 98.1 F (36.7 C) 98.1 F (36.7 C)  98.6 F (37 C)  TempSrc: Oral Oral  Oral  SpO2: 94% 94% 96% 92%  Weight: 67.1 kg     Height:        GEN- The patient is well appearing, alert and oriented x 3 today.   HEENT: normocephalic, atraumatic; sclera clear, conjunctiva pink; hearing intact; oropharynx clear; neck supple, no JVP Lungs- soft exp wheezes b/l, normal work of breathing.  No wheezes, rales, rhonchi Heart- RRR, 1/6 SM, no rubs or gallops, PMI not laterally displaced GI- soft, non-tender, non-distended Extremities- no clubbing, cyanosis, or edema MS- no significant deformity or atrophy Skin- warm and dry, no rash or lesion, left chest without hematoma/ecchymosis Psych- euthymic mood, full affect Neuro- no gross deficits   Labs:   Lab Results  Component Value Date   WBC 10.4 11/30/2017   HGB 10.9 (L) 12/08/2017   HCT 32.0 (  L) 12/08/2017   MCV 88 11/30/2017   PLT 248 11/30/2017    Recent Labs  Lab 12/09/17 0426  NA 141  K 3.2*  CL 106  CO2 24  BUN 29*  CREATININE 1.63*  CALCIUM 8.4*  GLUCOSE 111*    Discharge Medications:  Allergies as of 12/09/2017      Reactions   Dronedarone Diarrhea   Aspirin Other (See Comments)   Stomach burning, Can only take coated   Daypro [oxaprozin] Other (See Comments)   Dizziness, Affects driving      Medication List    TAKE these  medications   acetaminophen 325 MG tablet Commonly known as:  TYLENOL Take 325 mg by mouth every 4 (four) hours as needed for moderate pain or fever. May be administered orally, per G-tube if needed or rectally if unable to swallow (separate order). Maximum dose for 24 hours is 3,000 mg from all sources of Acetaminophen/ Tylenol   amiodarone 200 MG tablet Commonly known as:  PACERONE Take 1 tablet (200 mg total) by mouth daily.   amLODipine 10 MG tablet Commonly known as:  NORVASC Take 1 tablet (10 mg total) by mouth daily with breakfast.   apixaban 2.5 MG Tabs tablet Commonly known as:  ELIQUIS Take 1 tablet (2.5 mg total) by mouth 2 (two) times daily. Notes to patient:  Resume Saturday 12/11/17 morning   atorvastatin 20 MG tablet Commonly known as:  LIPITOR TAKE 1 TABLET EVERY EVENING What changed:    how much to take  how to take this  when to take this  additional instructions   buPROPion 75 MG tablet Commonly known as:  WELLBUTRIN Take 1 tablet (75 mg total) by mouth 2 (two) times daily.   cholecalciferol 1000 units tablet Commonly known as:  VITAMIN D Take 1,000 Units by mouth daily.   docusate sodium 100 MG capsule Commonly known as:  COLACE Take 1 capsule (100 mg total) by mouth 2 (two) times daily.   fluticasone 50 MCG/ACT nasal spray Commonly known as:  FLONASE Place 1 spray into both nostrils daily as needed for allergies.   furosemide 40 MG tablet Commonly known as:  LASIX Take 1 tablet (40 mg total) by mouth every other day.   guaiFENesin-dextromethorphan 100-10 MG/5ML syrup Commonly known as:  ROBITUSSIN DM Take 5 mLs by mouth every 4 (four) hours as needed for cough.   hydrALAZINE 25 MG tablet Commonly known as:  APRESOLINE Take 1 tablet (25 mg total) by mouth 3 (three) times daily.   HYDROcodone-acetaminophen 7.5-325 MG tablet Commonly known as:  NORCO Take 1 tablet by mouth 2 (two) times daily as needed for moderate pain.     ipratropium-albuterol 0.5-2.5 (3) MG/3ML Soln Commonly known as:  DUONEB Take 3 mLs by nebulization every 6 (six) hours as needed.   isosorbide mononitrate 120 MG 24 hr tablet Commonly known as:  IMDUR TAKE 1 TABLET(120 MG) BY MOUTH DAILY WITH BREAKFAST What changed:    how much to take  how to take this  when to take this  additional instructions   LORazepam 0.5 MG tablet Commonly known as:  ATIVAN Take 1 tablet (0.5 mg total) by mouth every 12 (twelve) hours as needed. What changed:    when to take this  reasons to take this   metoprolol succinate 100 MG 24 hr tablet Commonly known as:  TOPROL-XL TAKE 1 TABLET(100 MG) BY MOUTH TWICE DAILY   oxybutynin 5 MG 24 hr tablet Commonly known as:  DITROPAN-XL Take 1 tablet (5 mg total) by mouth at bedtime. MUST SCHEDULE ANNUAL EXAM FOR REFILLS   polyethylene glycol packet Commonly known as:  MIRALAX / GLYCOLAX Take 17 g by mouth 2 (two) times daily. What changed:    when to take this  reasons to take this   psyllium 58.6 % packet Commonly known as:  METAMUCIL Take 1 packet by mouth at bedtime.   psyllium 0.52 g capsule Commonly known as:  REGULOID Take 0.52 g by mouth daily.   ranitidine 150 MG tablet Commonly known as:  ZANTAC Take 150 mg by mouth 2 (two) times daily.       Disposition:   Home  Discharge Instructions    Diet - low sodium heart healthy   Complete by:  As directed    Increase activity slowly   Complete by:  As directed      Follow-up Information    [facility] Follow up on 12/23/2017.   Specialty:  Cardiology Why:  11:30AM, wound check visit and lab work Contact information: [address], [address] [place]       Bensimhon, Shaune Pascal, MD Follow up on 12/23/2017.   Specialty:  Cardiology Why:  9:20AM Contact information: [address] [place] [address] [phone]        Deboraha Sprang, MD Follow up on 03/10/2018.   Specialty:  Cardiology Why:  10:30AM Contact information: [place] [address] [phone]           Duration of Discharge Encounter: Greater than 30 minutes including physician time.  Venetia Night, PA-C 12/09/2017 12:33 PM

## 2017-12-14 DIAGNOSIS — E1122 Type 2 diabetes mellitus with diabetic chronic kidney disease: Secondary | ICD-10-CM | POA: Diagnosis not present

## 2017-12-14 DIAGNOSIS — I481 Persistent atrial fibrillation: Secondary | ICD-10-CM | POA: Diagnosis not present

## 2017-12-14 DIAGNOSIS — I130 Hypertensive heart and chronic kidney disease with heart failure and stage 1 through stage 4 chronic kidney disease, or unspecified chronic kidney disease: Secondary | ICD-10-CM | POA: Diagnosis not present

## 2017-12-14 DIAGNOSIS — I5042 Chronic combined systolic (congestive) and diastolic (congestive) heart failure: Secondary | ICD-10-CM | POA: Diagnosis not present

## 2017-12-14 DIAGNOSIS — J449 Chronic obstructive pulmonary disease, unspecified: Secondary | ICD-10-CM | POA: Diagnosis not present

## 2017-12-14 DIAGNOSIS — N183 Chronic kidney disease, stage 3 (moderate): Secondary | ICD-10-CM | POA: Diagnosis not present

## 2017-12-16 DIAGNOSIS — I130 Hypertensive heart and chronic kidney disease with heart failure and stage 1 through stage 4 chronic kidney disease, or unspecified chronic kidney disease: Secondary | ICD-10-CM | POA: Diagnosis not present

## 2017-12-16 DIAGNOSIS — E1122 Type 2 diabetes mellitus with diabetic chronic kidney disease: Secondary | ICD-10-CM | POA: Diagnosis not present

## 2017-12-16 DIAGNOSIS — J449 Chronic obstructive pulmonary disease, unspecified: Secondary | ICD-10-CM | POA: Diagnosis not present

## 2017-12-16 DIAGNOSIS — N183 Chronic kidney disease, stage 3 (moderate): Secondary | ICD-10-CM | POA: Diagnosis not present

## 2017-12-16 DIAGNOSIS — I481 Persistent atrial fibrillation: Secondary | ICD-10-CM | POA: Diagnosis not present

## 2017-12-16 DIAGNOSIS — I5042 Chronic combined systolic (congestive) and diastolic (congestive) heart failure: Secondary | ICD-10-CM | POA: Diagnosis not present

## 2017-12-21 ENCOUNTER — Ambulatory Visit: Payer: Medicare Other

## 2017-12-21 DIAGNOSIS — E1122 Type 2 diabetes mellitus with diabetic chronic kidney disease: Secondary | ICD-10-CM | POA: Diagnosis not present

## 2017-12-21 DIAGNOSIS — I481 Persistent atrial fibrillation: Secondary | ICD-10-CM | POA: Diagnosis not present

## 2017-12-21 DIAGNOSIS — I130 Hypertensive heart and chronic kidney disease with heart failure and stage 1 through stage 4 chronic kidney disease, or unspecified chronic kidney disease: Secondary | ICD-10-CM | POA: Diagnosis not present

## 2017-12-21 DIAGNOSIS — J449 Chronic obstructive pulmonary disease, unspecified: Secondary | ICD-10-CM | POA: Diagnosis not present

## 2017-12-21 DIAGNOSIS — I5042 Chronic combined systolic (congestive) and diastolic (congestive) heart failure: Secondary | ICD-10-CM | POA: Diagnosis not present

## 2017-12-21 DIAGNOSIS — N183 Chronic kidney disease, stage 3 (moderate): Secondary | ICD-10-CM | POA: Diagnosis not present

## 2017-12-22 DIAGNOSIS — Z7901 Long term (current) use of anticoagulants: Secondary | ICD-10-CM | POA: Diagnosis not present

## 2017-12-22 DIAGNOSIS — I2510 Atherosclerotic heart disease of native coronary artery without angina pectoris: Secondary | ICD-10-CM | POA: Diagnosis not present

## 2017-12-22 DIAGNOSIS — E1122 Type 2 diabetes mellitus with diabetic chronic kidney disease: Secondary | ICD-10-CM | POA: Diagnosis not present

## 2017-12-22 DIAGNOSIS — F329 Major depressive disorder, single episode, unspecified: Secondary | ICD-10-CM | POA: Diagnosis not present

## 2017-12-22 DIAGNOSIS — Z9181 History of falling: Secondary | ICD-10-CM | POA: Diagnosis not present

## 2017-12-22 DIAGNOSIS — M1991 Primary osteoarthritis, unspecified site: Secondary | ICD-10-CM | POA: Diagnosis not present

## 2017-12-22 DIAGNOSIS — N184 Chronic kidney disease, stage 4 (severe): Secondary | ICD-10-CM | POA: Diagnosis not present

## 2017-12-22 DIAGNOSIS — Z96641 Presence of right artificial hip joint: Secondary | ICD-10-CM | POA: Diagnosis not present

## 2017-12-22 DIAGNOSIS — I5042 Chronic combined systolic (congestive) and diastolic (congestive) heart failure: Secondary | ICD-10-CM | POA: Diagnosis not present

## 2017-12-22 DIAGNOSIS — I4811 Longstanding persistent atrial fibrillation: Secondary | ICD-10-CM | POA: Diagnosis not present

## 2017-12-22 DIAGNOSIS — M810 Age-related osteoporosis without current pathological fracture: Secondary | ICD-10-CM | POA: Diagnosis not present

## 2017-12-22 DIAGNOSIS — I130 Hypertensive heart and chronic kidney disease with heart failure and stage 1 through stage 4 chronic kidney disease, or unspecified chronic kidney disease: Secondary | ICD-10-CM | POA: Diagnosis not present

## 2017-12-22 DIAGNOSIS — F17210 Nicotine dependence, cigarettes, uncomplicated: Secondary | ICD-10-CM | POA: Diagnosis not present

## 2017-12-22 DIAGNOSIS — J449 Chronic obstructive pulmonary disease, unspecified: Secondary | ICD-10-CM | POA: Diagnosis not present

## 2017-12-22 DIAGNOSIS — H9190 Unspecified hearing loss, unspecified ear: Secondary | ICD-10-CM | POA: Diagnosis not present

## 2017-12-22 DIAGNOSIS — I495 Sick sinus syndrome: Secondary | ICD-10-CM | POA: Diagnosis not present

## 2017-12-22 DIAGNOSIS — I080 Rheumatic disorders of both mitral and aortic valves: Secondary | ICD-10-CM | POA: Diagnosis not present

## 2017-12-22 DIAGNOSIS — Z951 Presence of aortocoronary bypass graft: Secondary | ICD-10-CM | POA: Diagnosis not present

## 2017-12-22 DIAGNOSIS — Z950 Presence of cardiac pacemaker: Secondary | ICD-10-CM | POA: Diagnosis not present

## 2017-12-22 DIAGNOSIS — Z79891 Long term (current) use of opiate analgesic: Secondary | ICD-10-CM | POA: Diagnosis not present

## 2017-12-22 DIAGNOSIS — Z8701 Personal history of pneumonia (recurrent): Secondary | ICD-10-CM | POA: Diagnosis not present

## 2017-12-23 ENCOUNTER — Ambulatory Visit (HOSPITAL_COMMUNITY)
Admission: RE | Admit: 2017-12-23 | Discharge: 2017-12-23 | Disposition: A | Discharge status: Home / Self Care | Payer: Medicare Other | Source: Ambulatory Visit | Attending: Internal Medicine | Admitting: Internal Medicine

## 2017-12-23 ENCOUNTER — Ambulatory Visit (INDEPENDENT_AMBULATORY_CARE_PROVIDER_SITE_OTHER): Payer: Medicare Other | Admitting: *Deleted

## 2017-12-23 ENCOUNTER — Encounter (HOSPITAL_COMMUNITY): Payer: Self-pay | Admitting: Internal Medicine

## 2017-12-23 ENCOUNTER — Other Ambulatory Visit: Payer: Medicare Other

## 2017-12-23 VITALS — BP 149/74 | HR 86 | Wt 147.0 lb

## 2017-12-23 DIAGNOSIS — E1122 Type 2 diabetes mellitus with diabetic chronic kidney disease: Secondary | ICD-10-CM | POA: Diagnosis not present

## 2017-12-23 DIAGNOSIS — Z79899 Other long term (current) drug therapy: Secondary | ICD-10-CM | POA: Insufficient documentation

## 2017-12-23 DIAGNOSIS — I130 Hypertensive heart and chronic kidney disease with heart failure and stage 1 through stage 4 chronic kidney disease, or unspecified chronic kidney disease: Secondary | ICD-10-CM | POA: Insufficient documentation

## 2017-12-23 DIAGNOSIS — I4819 Other persistent atrial fibrillation: Secondary | ICD-10-CM | POA: Diagnosis not present

## 2017-12-23 DIAGNOSIS — F419 Anxiety disorder, unspecified: Secondary | ICD-10-CM | POA: Insufficient documentation

## 2017-12-23 DIAGNOSIS — Z7901 Long term (current) use of anticoagulants: Secondary | ICD-10-CM | POA: Insufficient documentation

## 2017-12-23 DIAGNOSIS — N184 Chronic kidney disease, stage 4 (severe): Secondary | ICD-10-CM | POA: Diagnosis not present

## 2017-12-23 DIAGNOSIS — I2510 Atherosclerotic heart disease of native coronary artery without angina pectoris: Secondary | ICD-10-CM | POA: Diagnosis not present

## 2017-12-23 DIAGNOSIS — I10 Essential (primary) hypertension: Secondary | ICD-10-CM | POA: Diagnosis not present

## 2017-12-23 DIAGNOSIS — Z8249 Family history of ischemic heart disease and other diseases of the circulatory system: Secondary | ICD-10-CM | POA: Insufficient documentation

## 2017-12-23 DIAGNOSIS — M1990 Unspecified osteoarthritis, unspecified site: Secondary | ICD-10-CM | POA: Insufficient documentation

## 2017-12-23 DIAGNOSIS — J449 Chronic obstructive pulmonary disease, unspecified: Secondary | ICD-10-CM | POA: Insufficient documentation

## 2017-12-23 DIAGNOSIS — K219 Gastro-esophageal reflux disease without esophagitis: Secondary | ICD-10-CM | POA: Insufficient documentation

## 2017-12-23 DIAGNOSIS — F329 Major depressive disorder, single episode, unspecified: Secondary | ICD-10-CM | POA: Diagnosis not present

## 2017-12-23 DIAGNOSIS — R001 Bradycardia, unspecified: Secondary | ICD-10-CM | POA: Insufficient documentation

## 2017-12-23 DIAGNOSIS — F17210 Nicotine dependence, cigarettes, uncomplicated: Secondary | ICD-10-CM | POA: Insufficient documentation

## 2017-12-23 DIAGNOSIS — Z950 Presence of cardiac pacemaker: Secondary | ICD-10-CM | POA: Diagnosis not present

## 2017-12-23 DIAGNOSIS — I442 Atrioventricular block, complete: Secondary | ICD-10-CM | POA: Diagnosis not present

## 2017-12-23 DIAGNOSIS — Z951 Presence of aortocoronary bypass graft: Secondary | ICD-10-CM | POA: Insufficient documentation

## 2017-12-23 DIAGNOSIS — I480 Paroxysmal atrial fibrillation: Secondary | ICD-10-CM

## 2017-12-23 DIAGNOSIS — Z7951 Long term (current) use of inhaled steroids: Secondary | ICD-10-CM | POA: Insufficient documentation

## 2017-12-23 DIAGNOSIS — I5042 Chronic combined systolic (congestive) and diastolic (congestive) heart failure: Secondary | ICD-10-CM | POA: Diagnosis not present

## 2017-12-23 DIAGNOSIS — N183 Chronic kidney disease, stage 3 unspecified: Secondary | ICD-10-CM

## 2017-12-23 DIAGNOSIS — I340 Nonrheumatic mitral (valve) insufficiency: Secondary | ICD-10-CM

## 2017-12-23 DIAGNOSIS — E785 Hyperlipidemia, unspecified: Secondary | ICD-10-CM | POA: Diagnosis not present

## 2017-12-23 LAB — CUP PACEART INCLINIC DEVICE CHECK
Battery Voltage: 2.99 V
Brady Statistic AP VS Percent: 0.01 %
Brady Statistic AS VS Percent: 0.01 %
Brady Statistic RA Percent Paced: 94.35 %
Brady Statistic RV Percent Paced: 99.98 %
Date Time Interrogation Session: 20191024151557
Implantable Lead Implant Date: 20130529
Implantable Lead Implant Date: 20191009
Implantable Lead Location: 753860
Implantable Lead Model: 3830
Implantable Lead Model: 4574
Implantable Pulse Generator Implant Date: 20191009
Lead Channel Impedance Value: 209 Ohm
Lead Channel Impedance Value: 304 Ohm
Lead Channel Impedance Value: 304 Ohm
Lead Channel Impedance Value: 304 Ohm
Lead Channel Impedance Value: 323 Ohm
Lead Channel Impedance Value: 437 Ohm
Lead Channel Pacing Threshold Amplitude: 1.5 V
Lead Channel Pacing Threshold Pulse Width: 1.3 ms
Lead Channel Sensing Intrinsic Amplitude: 1.875 mV
Lead Channel Setting Pacing Amplitude: 5 V
Lead Channel Setting Pacing Pulse Width: 1 ms
MDC IDC LEAD IMPLANT DT: 20130529
MDC IDC LEAD LOCATION: 753859
MDC IDC LEAD LOCATION: 753860
MDC IDC MSMT BATTERY REMAINING LONGEVITY: 31 mo
MDC IDC MSMT LEADCHNL LV IMPEDANCE VALUE: 380 Ohm
MDC IDC MSMT LEADCHNL LV PACING THRESHOLD PULSEWIDTH: 1 ms
MDC IDC MSMT LEADCHNL RA IMPEDANCE VALUE: 380 Ohm
MDC IDC MSMT LEADCHNL RA SENSING INTR AMPL: 1.875 mV
MDC IDC MSMT LEADCHNL RV IMPEDANCE VALUE: 380 Ohm
MDC IDC MSMT LEADCHNL RV PACING THRESHOLD AMPLITUDE: 1.25 V
MDC IDC SET LEADCHNL RA PACING AMPLITUDE: 3.5 V
MDC IDC SET LEADCHNL RV PACING AMPLITUDE: 3.5 V
MDC IDC SET LEADCHNL RV PACING PULSEWIDTH: 1.3 ms
MDC IDC SET LEADCHNL RV SENSING SENSITIVITY: 4 mV
MDC IDC STAT BRADY AP VP PERCENT: 98.68 %
MDC IDC STAT BRADY AS VP PERCENT: 1.3 %

## 2017-12-23 LAB — BASIC METABOLIC PANEL
Anion gap: 9 (ref 5–15)
BUN: 19 mg/dL (ref 8–23)
CALCIUM: 9 mg/dL (ref 8.9–10.3)
CO2: 27 mmol/L (ref 22–32)
CREATININE: 1.5 mg/dL — AB (ref 0.44–1.00)
Chloride: 104 mmol/L (ref 98–111)
GFR calc Af Amer: 37 mL/min — ABNORMAL LOW (ref >60)
GFR calc non Af Amer: 32 mL/min — ABNORMAL LOW (ref >60)
GLUCOSE: 127 mg/dL — AB (ref 70–99)
Potassium: 4.1 mmol/L (ref 3.5–5.1)
Sodium: 140 mmol/L (ref 135–145)

## 2017-12-23 LAB — BRAIN NATRIURETIC PEPTIDE: B Natriuretic Peptide: 480.9 pg/mL — ABNORMAL HIGH (ref 0.0–100.0)

## 2017-12-23 MED ORDER — POTASSIUM CHLORIDE CRYS ER 20 MEQ PO TBCR: 20.0000 meq | EXTENDED_RELEASE_TABLET | Freq: Every day | ORAL | 3 refills | Status: DC

## 2017-12-23 NOTE — Progress Notes (Signed)
CRT-P Wound check appointment. Dermabond removed. Wound without redness or edema. Incision edges approximated, wound well healed. Normal device function. Thresholds, sensing, and impedances consistent with implant measurements. LV lead is HIS lead. EKG appears non-selective pacing (LV/HIS pacing).Device programmed at 3.5V for extra safety margin until 3 month visit. Histogram distribution appropriate for patient and level of activity. 4.5% AT/AF + Eliquis .No high ventricular rates noted. Patient educated about wound care, arm mobility, lifting restrictions. ROV w/ SK 03/10/2018
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
## 2017-12-23 NOTE — Patient Instructions (Signed)
Routine lab work today. Will notify you of abnormal results  START Potassium 53meq daily.  Follow up in 2 months with echo.

## 2017-12-28 ENCOUNTER — Ambulatory Visit: Payer: Medicare Other

## 2017-12-28 DIAGNOSIS — E1122 Type 2 diabetes mellitus with diabetic chronic kidney disease: Secondary | ICD-10-CM | POA: Diagnosis not present

## 2017-12-28 DIAGNOSIS — I130 Hypertensive heart and chronic kidney disease with heart failure and stage 1 through stage 4 chronic kidney disease, or unspecified chronic kidney disease: Secondary | ICD-10-CM | POA: Diagnosis not present

## 2017-12-28 DIAGNOSIS — I5042 Chronic combined systolic (congestive) and diastolic (congestive) heart failure: Secondary | ICD-10-CM | POA: Diagnosis not present

## 2017-12-28 DIAGNOSIS — J449 Chronic obstructive pulmonary disease, unspecified: Secondary | ICD-10-CM | POA: Diagnosis not present

## 2017-12-28 DIAGNOSIS — N184 Chronic kidney disease, stage 4 (severe): Secondary | ICD-10-CM | POA: Diagnosis not present

## 2017-12-28 DIAGNOSIS — I4811 Longstanding persistent atrial fibrillation: Secondary | ICD-10-CM | POA: Diagnosis not present

## 2017-12-30 DIAGNOSIS — I5042 Chronic combined systolic (congestive) and diastolic (congestive) heart failure: Secondary | ICD-10-CM | POA: Diagnosis not present

## 2017-12-30 DIAGNOSIS — N184 Chronic kidney disease, stage 4 (severe): Secondary | ICD-10-CM | POA: Diagnosis not present

## 2017-12-30 DIAGNOSIS — I4811 Longstanding persistent atrial fibrillation: Secondary | ICD-10-CM | POA: Diagnosis not present

## 2017-12-30 DIAGNOSIS — E1122 Type 2 diabetes mellitus with diabetic chronic kidney disease: Secondary | ICD-10-CM | POA: Diagnosis not present

## 2017-12-30 DIAGNOSIS — I130 Hypertensive heart and chronic kidney disease with heart failure and stage 1 through stage 4 chronic kidney disease, or unspecified chronic kidney disease: Secondary | ICD-10-CM | POA: Diagnosis not present

## 2017-12-30 DIAGNOSIS — J449 Chronic obstructive pulmonary disease, unspecified: Secondary | ICD-10-CM | POA: Diagnosis not present

## 2018-01-04 DIAGNOSIS — I130 Hypertensive heart and chronic kidney disease with heart failure and stage 1 through stage 4 chronic kidney disease, or unspecified chronic kidney disease: Secondary | ICD-10-CM | POA: Diagnosis not present

## 2018-01-04 DIAGNOSIS — N184 Chronic kidney disease, stage 4 (severe): Secondary | ICD-10-CM | POA: Diagnosis not present

## 2018-01-04 DIAGNOSIS — E1122 Type 2 diabetes mellitus with diabetic chronic kidney disease: Secondary | ICD-10-CM | POA: Diagnosis not present

## 2018-01-04 DIAGNOSIS — I5042 Chronic combined systolic (congestive) and diastolic (congestive) heart failure: Secondary | ICD-10-CM | POA: Diagnosis not present

## 2018-01-04 DIAGNOSIS — J449 Chronic obstructive pulmonary disease, unspecified: Secondary | ICD-10-CM | POA: Diagnosis not present

## 2018-01-04 DIAGNOSIS — I4811 Longstanding persistent atrial fibrillation: Secondary | ICD-10-CM | POA: Diagnosis not present

## 2018-01-06 DIAGNOSIS — I5042 Chronic combined systolic (congestive) and diastolic (congestive) heart failure: Secondary | ICD-10-CM | POA: Diagnosis not present

## 2018-01-06 DIAGNOSIS — N184 Chronic kidney disease, stage 4 (severe): Secondary | ICD-10-CM | POA: Diagnosis not present

## 2018-01-06 DIAGNOSIS — J449 Chronic obstructive pulmonary disease, unspecified: Secondary | ICD-10-CM | POA: Diagnosis not present

## 2018-01-06 DIAGNOSIS — I4811 Longstanding persistent atrial fibrillation: Secondary | ICD-10-CM | POA: Diagnosis not present

## 2018-01-06 DIAGNOSIS — E1122 Type 2 diabetes mellitus with diabetic chronic kidney disease: Secondary | ICD-10-CM | POA: Diagnosis not present

## 2018-01-06 DIAGNOSIS — I130 Hypertensive heart and chronic kidney disease with heart failure and stage 1 through stage 4 chronic kidney disease, or unspecified chronic kidney disease: Secondary | ICD-10-CM | POA: Diagnosis not present

## 2018-01-07 DIAGNOSIS — I4811 Longstanding persistent atrial fibrillation: Secondary | ICD-10-CM | POA: Diagnosis not present

## 2018-01-07 DIAGNOSIS — N184 Chronic kidney disease, stage 4 (severe): Secondary | ICD-10-CM | POA: Diagnosis not present

## 2018-01-07 DIAGNOSIS — I130 Hypertensive heart and chronic kidney disease with heart failure and stage 1 through stage 4 chronic kidney disease, or unspecified chronic kidney disease: Secondary | ICD-10-CM | POA: Diagnosis not present

## 2018-01-07 DIAGNOSIS — E1122 Type 2 diabetes mellitus with diabetic chronic kidney disease: Secondary | ICD-10-CM | POA: Diagnosis not present

## 2018-01-07 DIAGNOSIS — J449 Chronic obstructive pulmonary disease, unspecified: Secondary | ICD-10-CM | POA: Diagnosis not present

## 2018-01-07 DIAGNOSIS — I5042 Chronic combined systolic (congestive) and diastolic (congestive) heart failure: Secondary | ICD-10-CM | POA: Diagnosis not present

## 2018-01-11 ENCOUNTER — Other Ambulatory Visit: Payer: Self-pay | Admitting: Internal Medicine

## 2018-01-11 DIAGNOSIS — N184 Chronic kidney disease, stage 4 (severe): Secondary | ICD-10-CM | POA: Diagnosis not present

## 2018-01-11 DIAGNOSIS — J449 Chronic obstructive pulmonary disease, unspecified: Secondary | ICD-10-CM | POA: Diagnosis not present

## 2018-01-11 DIAGNOSIS — I4811 Longstanding persistent atrial fibrillation: Secondary | ICD-10-CM | POA: Diagnosis not present

## 2018-01-11 DIAGNOSIS — I130 Hypertensive heart and chronic kidney disease with heart failure and stage 1 through stage 4 chronic kidney disease, or unspecified chronic kidney disease: Secondary | ICD-10-CM | POA: Diagnosis not present

## 2018-01-11 DIAGNOSIS — I5042 Chronic combined systolic (congestive) and diastolic (congestive) heart failure: Secondary | ICD-10-CM | POA: Diagnosis not present

## 2018-01-11 DIAGNOSIS — E1122 Type 2 diabetes mellitus with diabetic chronic kidney disease: Secondary | ICD-10-CM | POA: Diagnosis not present

## 2018-01-12 ENCOUNTER — Other Ambulatory Visit: Payer: Self-pay | Admitting: Internal Medicine

## 2018-01-12 NOTE — Telephone Encounter (Signed)
She needs to be seen for AWV, please schedule. Will get lipid panel at that time. Continue Atorvastatin now if she is still taking it.

## 2018-01-12 NOTE — Telephone Encounter (Signed)
Overdue CPE letter mailed to pt

## 2018-01-12 NOTE — Telephone Encounter (Signed)
I do not see where pt has had lipid labs checked and pt does follow with Cardiology.... Please advise if okay to refill as pt had CHF etc..Marland KitchenMarland Kitchen

## 2018-01-13 DIAGNOSIS — I4811 Longstanding persistent atrial fibrillation: Secondary | ICD-10-CM | POA: Diagnosis not present

## 2018-01-13 DIAGNOSIS — I130 Hypertensive heart and chronic kidney disease with heart failure and stage 1 through stage 4 chronic kidney disease, or unspecified chronic kidney disease: Secondary | ICD-10-CM | POA: Diagnosis not present

## 2018-01-13 DIAGNOSIS — N184 Chronic kidney disease, stage 4 (severe): Secondary | ICD-10-CM | POA: Diagnosis not present

## 2018-01-13 DIAGNOSIS — E1122 Type 2 diabetes mellitus with diabetic chronic kidney disease: Secondary | ICD-10-CM | POA: Diagnosis not present

## 2018-01-13 DIAGNOSIS — J449 Chronic obstructive pulmonary disease, unspecified: Secondary | ICD-10-CM | POA: Diagnosis not present

## 2018-01-13 DIAGNOSIS — I5042 Chronic combined systolic (congestive) and diastolic (congestive) heart failure: Secondary | ICD-10-CM | POA: Diagnosis not present

## 2018-01-18 DIAGNOSIS — I130 Hypertensive heart and chronic kidney disease with heart failure and stage 1 through stage 4 chronic kidney disease, or unspecified chronic kidney disease: Secondary | ICD-10-CM | POA: Diagnosis not present

## 2018-01-18 DIAGNOSIS — I5042 Chronic combined systolic (congestive) and diastolic (congestive) heart failure: Secondary | ICD-10-CM | POA: Diagnosis not present

## 2018-01-18 DIAGNOSIS — J449 Chronic obstructive pulmonary disease, unspecified: Secondary | ICD-10-CM | POA: Diagnosis not present

## 2018-01-18 DIAGNOSIS — E1122 Type 2 diabetes mellitus with diabetic chronic kidney disease: Secondary | ICD-10-CM | POA: Diagnosis not present

## 2018-01-18 DIAGNOSIS — I4811 Longstanding persistent atrial fibrillation: Secondary | ICD-10-CM | POA: Diagnosis not present

## 2018-01-18 DIAGNOSIS — N184 Chronic kidney disease, stage 4 (severe): Secondary | ICD-10-CM | POA: Diagnosis not present

## 2018-01-20 DIAGNOSIS — I4811 Longstanding persistent atrial fibrillation: Secondary | ICD-10-CM | POA: Diagnosis not present

## 2018-01-20 DIAGNOSIS — J449 Chronic obstructive pulmonary disease, unspecified: Secondary | ICD-10-CM | POA: Diagnosis not present

## 2018-01-20 DIAGNOSIS — I130 Hypertensive heart and chronic kidney disease with heart failure and stage 1 through stage 4 chronic kidney disease, or unspecified chronic kidney disease: Secondary | ICD-10-CM | POA: Diagnosis not present

## 2018-01-20 DIAGNOSIS — E1122 Type 2 diabetes mellitus with diabetic chronic kidney disease: Secondary | ICD-10-CM | POA: Diagnosis not present

## 2018-01-20 DIAGNOSIS — I5042 Chronic combined systolic (congestive) and diastolic (congestive) heart failure: Secondary | ICD-10-CM | POA: Diagnosis not present

## 2018-01-20 DIAGNOSIS — N184 Chronic kidney disease, stage 4 (severe): Secondary | ICD-10-CM | POA: Diagnosis not present

## 2018-01-24 ENCOUNTER — Ambulatory Visit (INDEPENDENT_AMBULATORY_CARE_PROVIDER_SITE_OTHER): Payer: Medicare Other | Admitting: Internal Medicine

## 2018-01-24 ENCOUNTER — Ambulatory Visit (INDEPENDENT_AMBULATORY_CARE_PROVIDER_SITE_OTHER)
Admission: RE | Admit: 2018-01-24 | Discharge: 2018-01-24 | Disposition: A | Discharge status: Home / Self Care | Payer: Medicare Other | Source: Ambulatory Visit | Attending: Internal Medicine | Admitting: Internal Medicine

## 2018-01-24 ENCOUNTER — Encounter: Payer: Self-pay | Admitting: Internal Medicine

## 2018-01-24 VITALS — BP 136/80 | HR 87 | Temp 97.9°F | Wt 144.0 lb

## 2018-01-24 DIAGNOSIS — G8929 Other chronic pain: Secondary | ICD-10-CM

## 2018-01-24 DIAGNOSIS — M25562 Pain in left knee: Secondary | ICD-10-CM

## 2018-01-24 DIAGNOSIS — I2510 Atherosclerotic heart disease of native coronary artery without angina pectoris: Secondary | ICD-10-CM

## 2018-01-24 DIAGNOSIS — N184 Chronic kidney disease, stage 4 (severe): Secondary | ICD-10-CM | POA: Diagnosis not present

## 2018-01-24 DIAGNOSIS — E1122 Type 2 diabetes mellitus with diabetic chronic kidney disease: Secondary | ICD-10-CM | POA: Diagnosis not present

## 2018-01-24 DIAGNOSIS — J449 Chronic obstructive pulmonary disease, unspecified: Secondary | ICD-10-CM | POA: Diagnosis not present

## 2018-01-24 DIAGNOSIS — M1712 Unilateral primary osteoarthritis, left knee: Secondary | ICD-10-CM | POA: Diagnosis not present

## 2018-01-24 DIAGNOSIS — I130 Hypertensive heart and chronic kidney disease with heart failure and stage 1 through stage 4 chronic kidney disease, or unspecified chronic kidney disease: Secondary | ICD-10-CM | POA: Diagnosis not present

## 2018-01-24 DIAGNOSIS — I4811 Longstanding persistent atrial fibrillation: Secondary | ICD-10-CM | POA: Diagnosis not present

## 2018-01-24 DIAGNOSIS — I5042 Chronic combined systolic (congestive) and diastolic (congestive) heart failure: Secondary | ICD-10-CM | POA: Diagnosis not present

## 2018-01-24 MED ORDER — LORAZEPAM 0.5 MG PO TABS: 0.5000 mg | ORAL_TABLET | Freq: Every day | ORAL | 0 refills | Status: DC | PRN

## 2018-01-24 MED ORDER — HYDROCODONE-ACETAMINOPHEN 7.5-325 MG PO TABS: 1.0000 | ORAL_TABLET | Freq: Two times a day (BID) | ORAL | 0 refills | Status: DC | PRN

## 2018-01-24 NOTE — Patient Instructions (Signed)
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
## 2018-01-25 DIAGNOSIS — N184 Chronic kidney disease, stage 4 (severe): Secondary | ICD-10-CM | POA: Diagnosis not present

## 2018-01-25 DIAGNOSIS — J449 Chronic obstructive pulmonary disease, unspecified: Secondary | ICD-10-CM | POA: Diagnosis not present

## 2018-01-25 DIAGNOSIS — I4811 Longstanding persistent atrial fibrillation: Secondary | ICD-10-CM | POA: Diagnosis not present

## 2018-01-25 DIAGNOSIS — I5042 Chronic combined systolic (congestive) and diastolic (congestive) heart failure: Secondary | ICD-10-CM | POA: Diagnosis not present

## 2018-01-25 DIAGNOSIS — I130 Hypertensive heart and chronic kidney disease with heart failure and stage 1 through stage 4 chronic kidney disease, or unspecified chronic kidney disease: Secondary | ICD-10-CM | POA: Diagnosis not present

## 2018-01-25 DIAGNOSIS — E1122 Type 2 diabetes mellitus with diabetic chronic kidney disease: Secondary | ICD-10-CM | POA: Diagnosis not present

## 2018-01-26 DIAGNOSIS — J449 Chronic obstructive pulmonary disease, unspecified: Secondary | ICD-10-CM | POA: Diagnosis not present

## 2018-01-26 DIAGNOSIS — I130 Hypertensive heart and chronic kidney disease with heart failure and stage 1 through stage 4 chronic kidney disease, or unspecified chronic kidney disease: Secondary | ICD-10-CM | POA: Diagnosis not present

## 2018-01-26 DIAGNOSIS — I4811 Longstanding persistent atrial fibrillation: Secondary | ICD-10-CM | POA: Diagnosis not present

## 2018-01-26 DIAGNOSIS — I5042 Chronic combined systolic (congestive) and diastolic (congestive) heart failure: Secondary | ICD-10-CM | POA: Diagnosis not present

## 2018-01-26 DIAGNOSIS — N184 Chronic kidney disease, stage 4 (severe): Secondary | ICD-10-CM | POA: Diagnosis not present

## 2018-01-26 DIAGNOSIS — E1122 Type 2 diabetes mellitus with diabetic chronic kidney disease: Secondary | ICD-10-CM | POA: Diagnosis not present

## 2018-01-31 DIAGNOSIS — I130 Hypertensive heart and chronic kidney disease with heart failure and stage 1 through stage 4 chronic kidney disease, or unspecified chronic kidney disease: Secondary | ICD-10-CM | POA: Diagnosis not present

## 2018-01-31 DIAGNOSIS — I4811 Longstanding persistent atrial fibrillation: Secondary | ICD-10-CM | POA: Diagnosis not present

## 2018-01-31 DIAGNOSIS — E1122 Type 2 diabetes mellitus with diabetic chronic kidney disease: Secondary | ICD-10-CM | POA: Diagnosis not present

## 2018-01-31 DIAGNOSIS — J449 Chronic obstructive pulmonary disease, unspecified: Secondary | ICD-10-CM | POA: Diagnosis not present

## 2018-01-31 DIAGNOSIS — I5042 Chronic combined systolic (congestive) and diastolic (congestive) heart failure: Secondary | ICD-10-CM | POA: Diagnosis not present

## 2018-01-31 DIAGNOSIS — N184 Chronic kidney disease, stage 4 (severe): Secondary | ICD-10-CM | POA: Diagnosis not present

## 2018-02-01 ENCOUNTER — Other Ambulatory Visit: Payer: Self-pay | Admitting: Internal Medicine

## 2018-02-02 DIAGNOSIS — E1122 Type 2 diabetes mellitus with diabetic chronic kidney disease: Secondary | ICD-10-CM | POA: Diagnosis not present

## 2018-02-02 DIAGNOSIS — I4811 Longstanding persistent atrial fibrillation: Secondary | ICD-10-CM | POA: Diagnosis not present

## 2018-02-02 DIAGNOSIS — I130 Hypertensive heart and chronic kidney disease with heart failure and stage 1 through stage 4 chronic kidney disease, or unspecified chronic kidney disease: Secondary | ICD-10-CM | POA: Diagnosis not present

## 2018-02-02 DIAGNOSIS — J449 Chronic obstructive pulmonary disease, unspecified: Secondary | ICD-10-CM | POA: Diagnosis not present

## 2018-02-02 DIAGNOSIS — N184 Chronic kidney disease, stage 4 (severe): Secondary | ICD-10-CM | POA: Diagnosis not present

## 2018-02-02 DIAGNOSIS — I5042 Chronic combined systolic (congestive) and diastolic (congestive) heart failure: Secondary | ICD-10-CM | POA: Diagnosis not present

## 2018-02-03 DIAGNOSIS — N184 Chronic kidney disease, stage 4 (severe): Secondary | ICD-10-CM | POA: Diagnosis not present

## 2018-02-03 DIAGNOSIS — I4811 Longstanding persistent atrial fibrillation: Secondary | ICD-10-CM | POA: Diagnosis not present

## 2018-02-03 DIAGNOSIS — I5042 Chronic combined systolic (congestive) and diastolic (congestive) heart failure: Secondary | ICD-10-CM | POA: Diagnosis not present

## 2018-02-03 DIAGNOSIS — J449 Chronic obstructive pulmonary disease, unspecified: Secondary | ICD-10-CM | POA: Diagnosis not present

## 2018-02-03 DIAGNOSIS — I130 Hypertensive heart and chronic kidney disease with heart failure and stage 1 through stage 4 chronic kidney disease, or unspecified chronic kidney disease: Secondary | ICD-10-CM | POA: Diagnosis not present

## 2018-02-03 DIAGNOSIS — E1122 Type 2 diabetes mellitus with diabetic chronic kidney disease: Secondary | ICD-10-CM | POA: Diagnosis not present

## 2018-02-07 DIAGNOSIS — N184 Chronic kidney disease, stage 4 (severe): Secondary | ICD-10-CM | POA: Diagnosis not present

## 2018-02-07 DIAGNOSIS — E1122 Type 2 diabetes mellitus with diabetic chronic kidney disease: Secondary | ICD-10-CM | POA: Diagnosis not present

## 2018-02-07 DIAGNOSIS — I130 Hypertensive heart and chronic kidney disease with heart failure and stage 1 through stage 4 chronic kidney disease, or unspecified chronic kidney disease: Secondary | ICD-10-CM | POA: Diagnosis not present

## 2018-02-07 DIAGNOSIS — J449 Chronic obstructive pulmonary disease, unspecified: Secondary | ICD-10-CM | POA: Diagnosis not present

## 2018-02-07 DIAGNOSIS — I4811 Longstanding persistent atrial fibrillation: Secondary | ICD-10-CM | POA: Diagnosis not present

## 2018-02-07 DIAGNOSIS — I5042 Chronic combined systolic (congestive) and diastolic (congestive) heart failure: Secondary | ICD-10-CM | POA: Diagnosis not present

## 2018-02-10 DIAGNOSIS — J449 Chronic obstructive pulmonary disease, unspecified: Secondary | ICD-10-CM | POA: Diagnosis not present

## 2018-02-10 DIAGNOSIS — I130 Hypertensive heart and chronic kidney disease with heart failure and stage 1 through stage 4 chronic kidney disease, or unspecified chronic kidney disease: Secondary | ICD-10-CM | POA: Diagnosis not present

## 2018-02-10 DIAGNOSIS — N184 Chronic kidney disease, stage 4 (severe): Secondary | ICD-10-CM | POA: Diagnosis not present

## 2018-02-10 DIAGNOSIS — I5042 Chronic combined systolic (congestive) and diastolic (congestive) heart failure: Secondary | ICD-10-CM | POA: Diagnosis not present

## 2018-02-10 DIAGNOSIS — I4811 Longstanding persistent atrial fibrillation: Secondary | ICD-10-CM | POA: Diagnosis not present

## 2018-02-10 DIAGNOSIS — E1122 Type 2 diabetes mellitus with diabetic chronic kidney disease: Secondary | ICD-10-CM | POA: Diagnosis not present

## 2018-02-15 ENCOUNTER — Other Ambulatory Visit: Payer: Self-pay | Admitting: Internal Medicine

## 2018-02-15 ENCOUNTER — Encounter: Payer: Medicare Other | Admitting: Internal Medicine

## 2018-02-15 DIAGNOSIS — N184 Chronic kidney disease, stage 4 (severe): Secondary | ICD-10-CM | POA: Diagnosis not present

## 2018-02-15 DIAGNOSIS — I4811 Longstanding persistent atrial fibrillation: Secondary | ICD-10-CM | POA: Diagnosis not present

## 2018-02-15 DIAGNOSIS — I5042 Chronic combined systolic (congestive) and diastolic (congestive) heart failure: Secondary | ICD-10-CM | POA: Diagnosis not present

## 2018-02-15 DIAGNOSIS — E1122 Type 2 diabetes mellitus with diabetic chronic kidney disease: Secondary | ICD-10-CM | POA: Diagnosis not present

## 2018-02-15 DIAGNOSIS — I130 Hypertensive heart and chronic kidney disease with heart failure and stage 1 through stage 4 chronic kidney disease, or unspecified chronic kidney disease: Secondary | ICD-10-CM | POA: Diagnosis not present

## 2018-02-15 DIAGNOSIS — J449 Chronic obstructive pulmonary disease, unspecified: Secondary | ICD-10-CM | POA: Diagnosis not present

## 2018-02-17 DIAGNOSIS — I130 Hypertensive heart and chronic kidney disease with heart failure and stage 1 through stage 4 chronic kidney disease, or unspecified chronic kidney disease: Secondary | ICD-10-CM | POA: Diagnosis not present

## 2018-02-17 DIAGNOSIS — I4811 Longstanding persistent atrial fibrillation: Secondary | ICD-10-CM | POA: Diagnosis not present

## 2018-02-17 DIAGNOSIS — N184 Chronic kidney disease, stage 4 (severe): Secondary | ICD-10-CM | POA: Diagnosis not present

## 2018-02-17 DIAGNOSIS — J449 Chronic obstructive pulmonary disease, unspecified: Secondary | ICD-10-CM | POA: Diagnosis not present

## 2018-02-17 DIAGNOSIS — E1122 Type 2 diabetes mellitus with diabetic chronic kidney disease: Secondary | ICD-10-CM | POA: Diagnosis not present

## 2018-02-17 DIAGNOSIS — I5042 Chronic combined systolic (congestive) and diastolic (congestive) heart failure: Secondary | ICD-10-CM | POA: Diagnosis not present

## 2018-03-03 IMAGING — CR DG HIP (WITH OR WITHOUT PELVIS) 2-3V*R*
3 series · 3 of 3 positions shown · non-contrast
Comparison: Plain films right hip 10/28/2015.

CLINICAL DATA: Right hip replacement with onset of pain last night.
History of prior dislocation. Initial encounter.

EXAM:
DG HIP (WITH OR WITHOUT PELVIS) 2-3V RIGHT

[x pelvis]
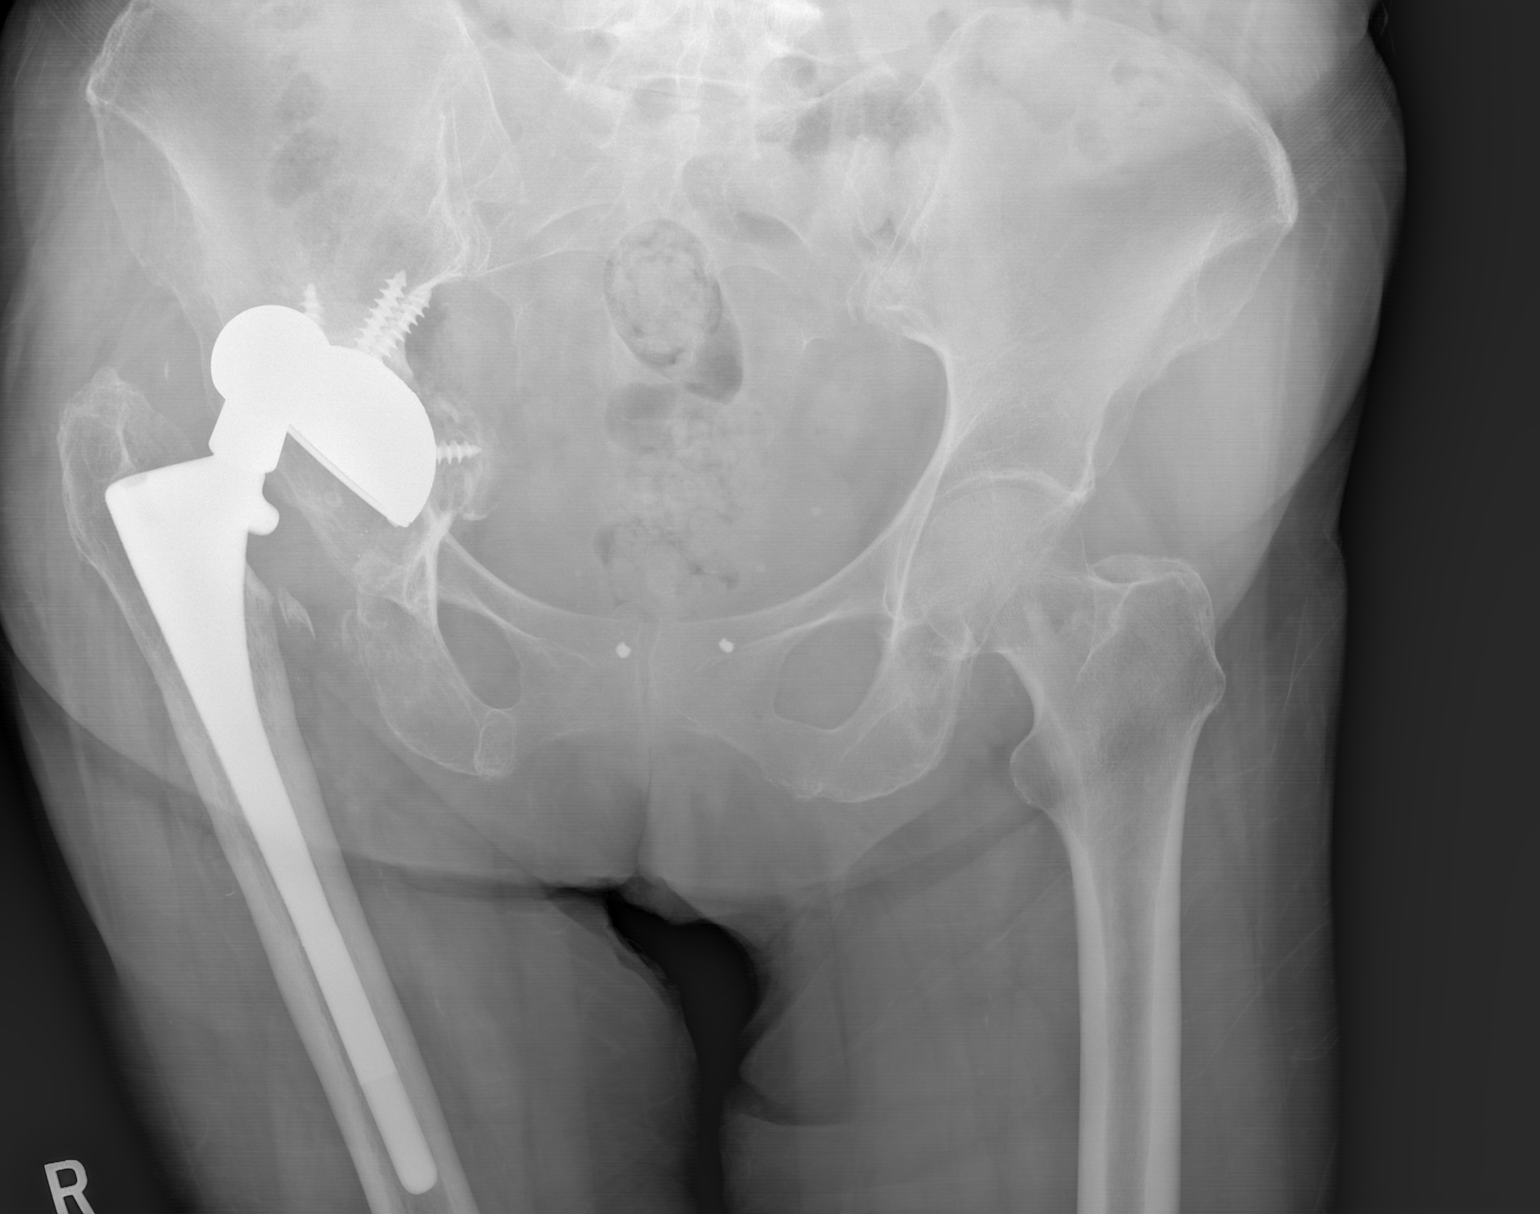

[x hip ap right]
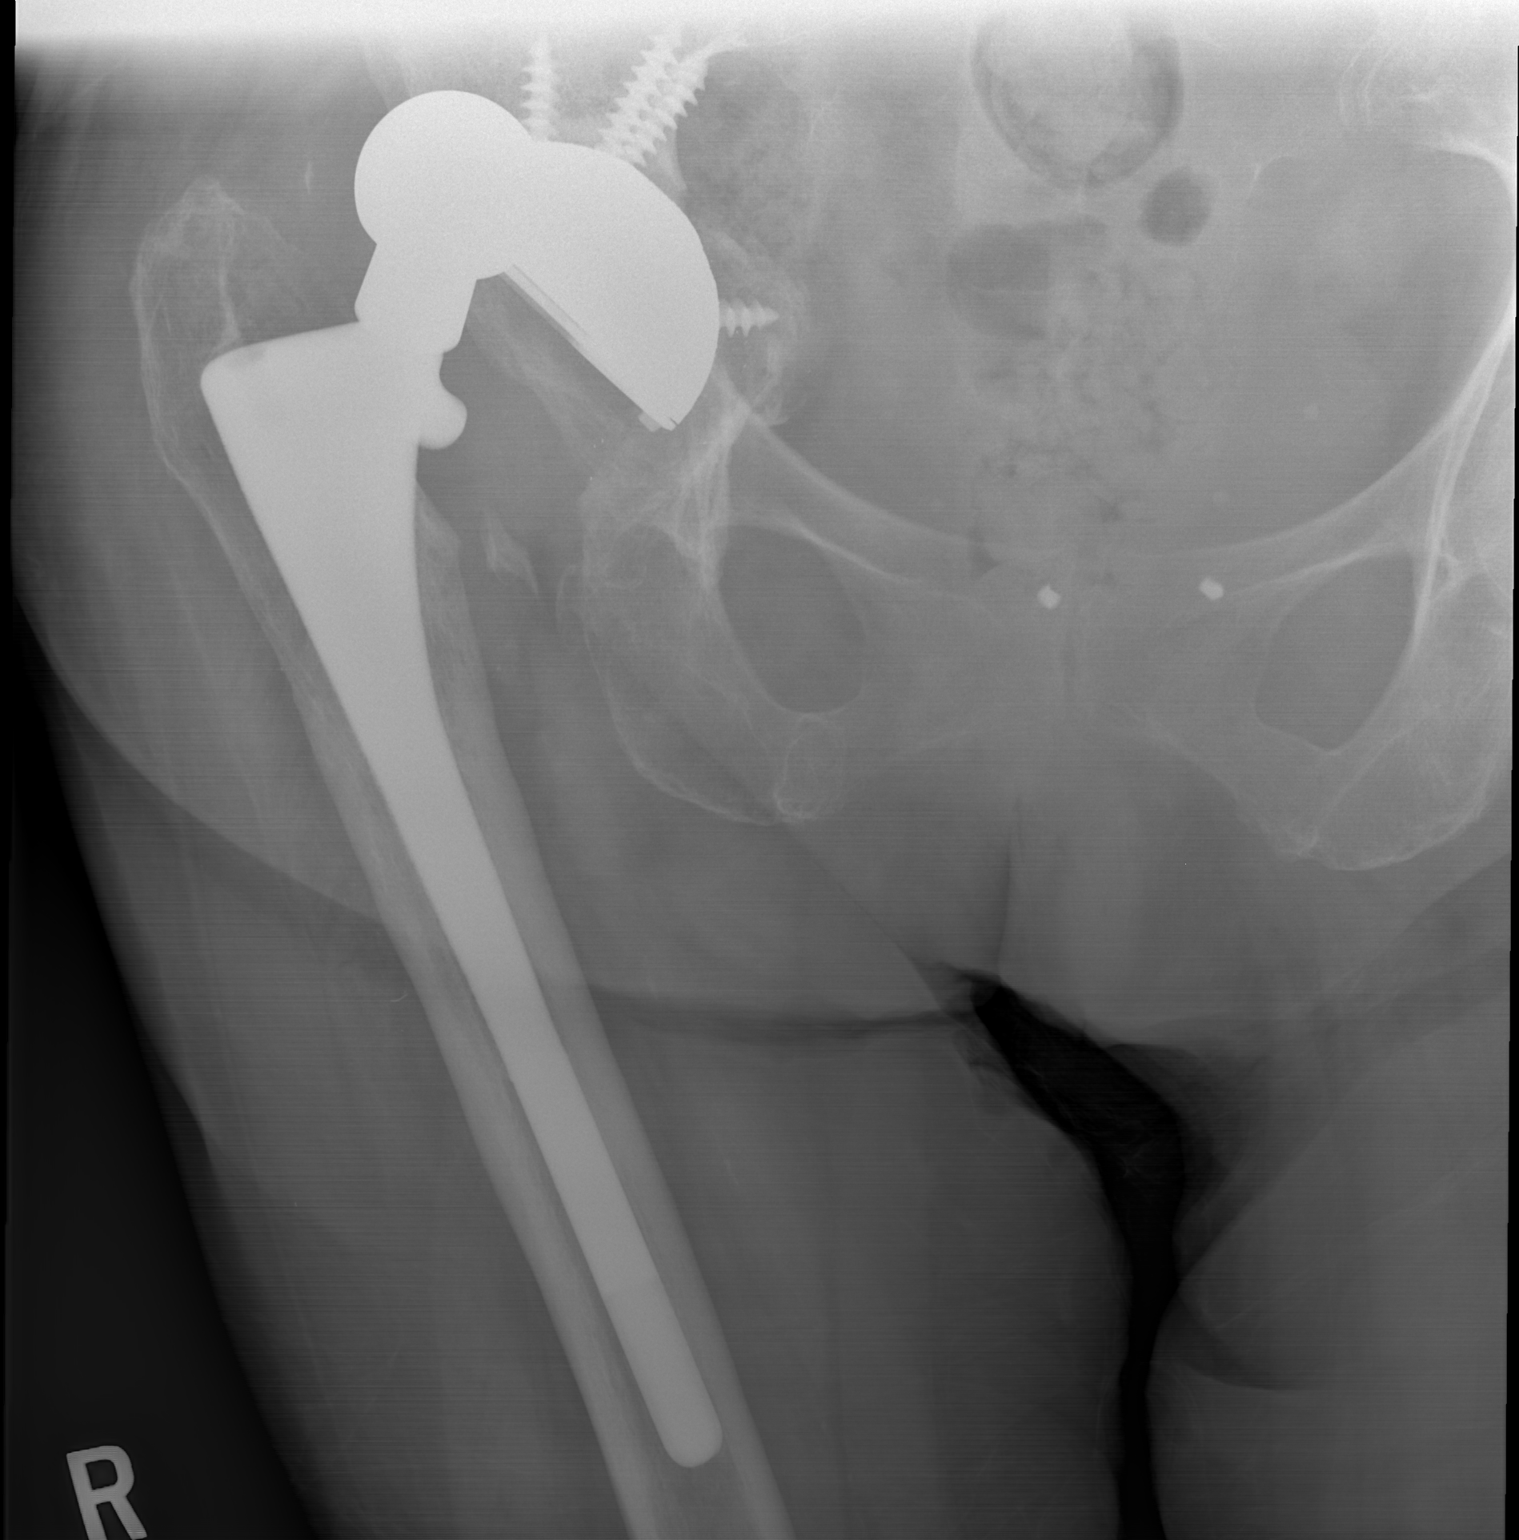

[w hip lat right]
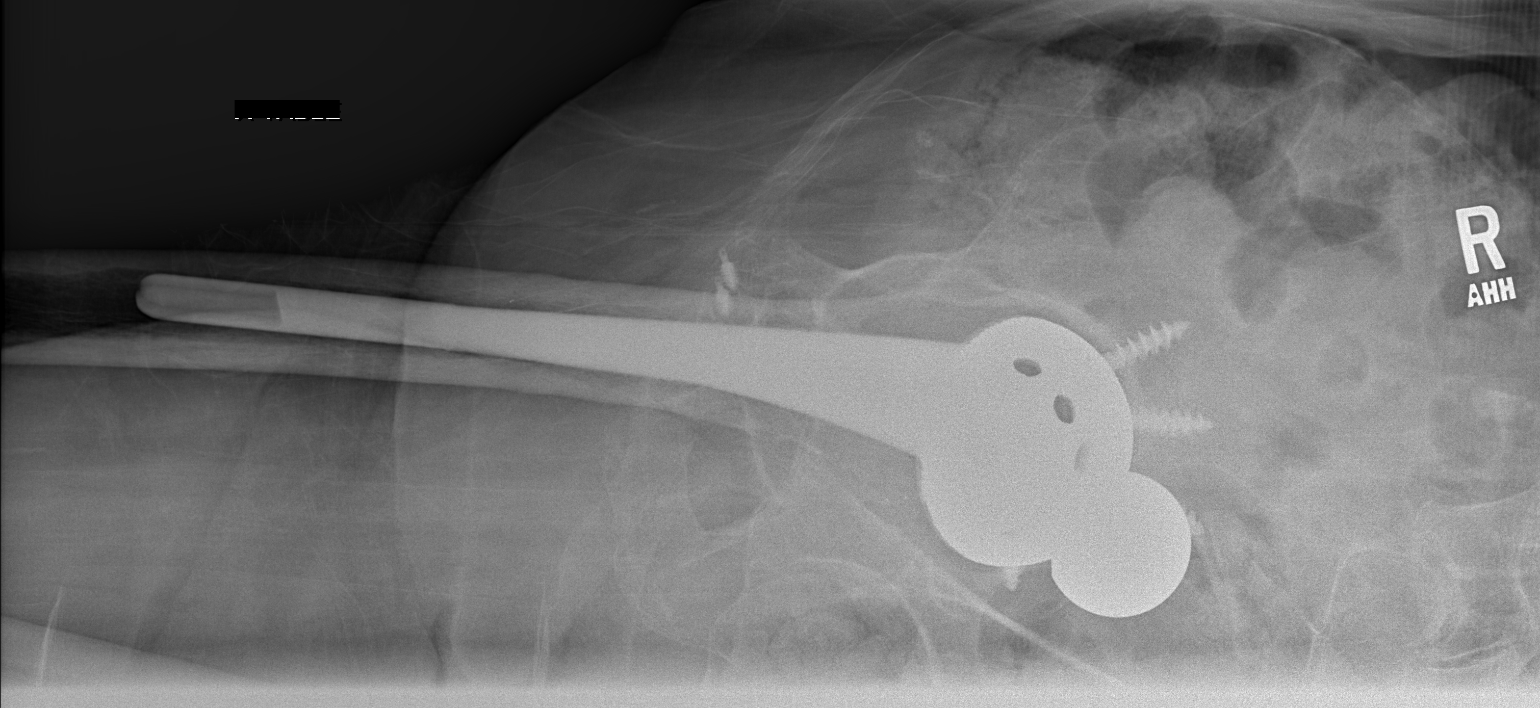

[3 of 3 positions shown; findings below may reference images not displayed]

FINDINGS: Right total hip arthroplasty is in place. The device is posteriorly,
superiorly dislocated. Chronic appearing defect in the medial wall
of the right acetabulum is unchanged. No acute fracture.
IMPRESSION: Dislocated right total hip arthroplasty.

## 2018-03-07 ENCOUNTER — Encounter (HOSPITAL_COMMUNITY): Payer: Medicare Other | Admitting: Internal Medicine

## 2018-03-07 ENCOUNTER — Ambulatory Visit (HOSPITAL_COMMUNITY): Payer: Medicare Other

## 2018-03-10 ENCOUNTER — Encounter: Payer: Self-pay | Admitting: Internal Medicine

## 2018-03-10 ENCOUNTER — Encounter: Payer: Medicare Other | Admitting: Internal Medicine

## 2018-03-10 ENCOUNTER — Ambulatory Visit (INDEPENDENT_AMBULATORY_CARE_PROVIDER_SITE_OTHER): Payer: Medicare Other | Admitting: Internal Medicine

## 2018-03-10 VITALS — BP 150/72 | HR 74 | Ht 65.0 in | Wt 150.5 lb

## 2018-03-10 DIAGNOSIS — Z79899 Other long term (current) drug therapy: Secondary | ICD-10-CM | POA: Diagnosis not present

## 2018-03-10 DIAGNOSIS — I5042 Chronic combined systolic (congestive) and diastolic (congestive) heart failure: Secondary | ICD-10-CM

## 2018-03-10 DIAGNOSIS — R001 Bradycardia, unspecified: Secondary | ICD-10-CM

## 2018-03-10 DIAGNOSIS — Z950 Presence of cardiac pacemaker: Secondary | ICD-10-CM

## 2018-03-10 DIAGNOSIS — I480 Paroxysmal atrial fibrillation: Secondary | ICD-10-CM | POA: Diagnosis not present

## 2018-03-10 MED ORDER — HYDRALAZINE HCL 25 MG PO TABS: ORAL_TABLET | ORAL | Status: DC
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
## 2018-03-10 NOTE — Patient Instructions (Addendum)
Medication Instructions:  - Your physician has recommended you make the following change in your medication:   1) Take hydralazine 25 mg- 1 & 1/2 tablets (37.5 mg) by mouth twice daily  If you need a refill on your cardiac medications before your next appointment, please call your pharmacy.   Lab work: - Your physician recommends that you have lab work today: CMET/ CBC/ TSH  If you have labs (blood work) drawn today and your tests are completely normal, you will receive your results only by: Marland Kitchen MyChart Message (if you have MyChart) OR . A paper copy in the mail If you have any lab test that is abnormal or we need to change your treatment, we will call you to review the results.  Testing/Procedures: - none ordered  Follow-Up: At [facility], you and your health needs are our priority.  As part of our continuing mission to provide you with exceptional heart care, we have created designated Provider Care Teams.  These Care Teams include your primary Cardiologist (physician) and Advanced Practice Providers (APPs -  Physician Assistants and Nurse Practitioners) who all work together to provide you with the care you need, when you need it. . You will need a follow up appointment in 6 months with Dr. Caryl Comes.  Please call our office 2 months in advance to schedule this appointment.   Remote monitoring is used to monitor your Pacemaker of ICD from home. This monitoring reduces the number of office visits required to check your device to one time per year. It allows [place] to keep an eye on the functioning of your device to ensure it is working properly. You are scheduled for a device check from home on 06/09/2018. You may send your transmission at any time that day. If you have a wireless device, the transmission will be sent automatically. After your physician reviews your transmission, you will receive a postcard with your next transmission date.   Any Other Special Instructions Will Be Listed Below (If  Applicable). - N/A

## 2018-03-11 LAB — TSH: TSH: 3.3 u[IU]/mL (ref 0.450–4.500)

## 2018-03-11 LAB — CBC WITH DIFFERENTIAL/PLATELET
Basophils Absolute: 0.1 10*3/uL (ref 0.0–0.2)
Basos: 1 %
EOS (ABSOLUTE): 0.2 10*3/uL (ref 0.0–0.4)
Eos: 2 %
Hematocrit: 40.5 % (ref 34.0–46.6)
Hemoglobin: 13.9 g/dL (ref 11.1–15.9)
Immature Grans (Abs): 0.2 10*3/uL — ABNORMAL HIGH (ref 0.0–0.1)
Immature Granulocytes: 2 %
Lymphocytes Absolute: 2.6 10*3/uL (ref 0.7–3.1)
Lymphs: 27 %
MCH: 30.8 pg (ref 26.6–33.0)
MCHC: 34.3 g/dL (ref 31.5–35.7)
MCV: 90 fL (ref 79–97)
MONOCYTES: 10 %
Monocytes Absolute: 1 10*3/uL — ABNORMAL HIGH (ref 0.1–0.9)
Neutrophils Absolute: 5.6 10*3/uL (ref 1.4–7.0)
Neutrophils: 58 %
Platelets: 284 10*3/uL (ref 150–450)
RBC: 4.52 x10E6/uL (ref 3.77–5.28)
RDW: 14.9 % (ref 11.7–15.4)
WBC: 9.7 10*3/uL (ref 3.4–10.8)

## 2018-03-11 LAB — COMPREHENSIVE METABOLIC PANEL
ALK PHOS: 47 IU/L (ref 39–117)
ALT: 39 IU/L — ABNORMAL HIGH (ref 0–32)
AST: 34 IU/L (ref 0–40)
Albumin/Globulin Ratio: 1.7 (ref 1.2–2.2)
Albumin: 4.5 g/dL (ref 3.5–4.7)
BUN/Creatinine Ratio: 19 (ref 12–28)
BUN: 31 mg/dL — ABNORMAL HIGH (ref 8–27)
Bilirubin Total: 0.3 mg/dL (ref 0.0–1.2)
CO2: 27 mmol/L (ref 20–29)
Calcium: 9.6 mg/dL (ref 8.7–10.3)
Chloride: 99 mmol/L (ref 96–106)
Creatinine, Ser: 1.66 mg/dL — ABNORMAL HIGH (ref 0.57–1.00)
GFR calc Af Amer: 33 mL/min/{1.73_m2} — ABNORMAL LOW (ref >59)
GFR calc non Af Amer: 29 mL/min/{1.73_m2} — ABNORMAL LOW (ref >59)
Globulin, Total: 2.6 g/dL (ref 1.5–4.5)
Glucose: 106 mg/dL — ABNORMAL HIGH (ref 65–99)
Potassium: 4.3 mmol/L (ref 3.5–5.2)
Sodium: 143 mmol/L (ref 134–144)
Total Protein: 7.1 g/dL (ref 6.0–8.5)

## 2018-03-14 ENCOUNTER — Telehealth: Payer: Self-pay | Admitting: Internal Medicine

## 2018-03-14 DIAGNOSIS — N289 Disorder of kidney and ureter, unspecified: Secondary | ICD-10-CM

## 2018-03-14 MED ORDER — FUROSEMIDE 40 MG PO TABS: ORAL_TABLET | ORAL | Status: DC

## 2018-03-14 NOTE — Telephone Encounter (Signed)
Notes recorded by Deboraha Sprang, MD on 03/12/2018 at 10:24 AM EST Please Inform Patient  Labs are normal x modest impairment in renal function Lets have her change her furosemid to 2/w and recheck in about 2 weeks plz  Thanks

## 2018-03-14 NOTE — Telephone Encounter (Signed)
I spoke with the patient's daughter, [name] (ok per DPR).   She is aware of Dr. Olin Pia recommendations to decrease lasix 40 mg to twice weekly and repeat a BMP in 2 weeks.  [name] verbalized understanding of the above. The patient will have a repeat BMP on 03/29/2018 at 8:30 am in the office.

## 2018-03-22 ENCOUNTER — Encounter: Payer: Medicare Other | Admitting: Internal Medicine

## 2018-03-26 ENCOUNTER — Other Ambulatory Visit: Payer: Self-pay | Admitting: Internal Medicine

## 2018-03-29 ENCOUNTER — Other Ambulatory Visit: Payer: Medicare Other

## 2018-03-30 NOTE — Telephone Encounter (Signed)
Ativan last filled 01/24/2018... please advise, pt does have upcoming OV scheduled for AWV

## 2018-04-06 ENCOUNTER — Ambulatory Visit (INDEPENDENT_AMBULATORY_CARE_PROVIDER_SITE_OTHER): Payer: Medicare Other | Admitting: Internal Medicine

## 2018-04-06 ENCOUNTER — Encounter: Payer: Self-pay | Admitting: Internal Medicine

## 2018-04-06 VITALS — BP 138/80 | HR 88 | Temp 97.8°F | Ht 65.0 in | Wt 153.0 lb

## 2018-04-06 DIAGNOSIS — N183 Chronic kidney disease, stage 3 unspecified: Secondary | ICD-10-CM

## 2018-04-06 DIAGNOSIS — F329 Major depressive disorder, single episode, unspecified: Secondary | ICD-10-CM

## 2018-04-06 DIAGNOSIS — E7800 Pure hypercholesterolemia, unspecified: Secondary | ICD-10-CM

## 2018-04-06 DIAGNOSIS — I5042 Chronic combined systolic (congestive) and diastolic (congestive) heart failure: Secondary | ICD-10-CM | POA: Diagnosis not present

## 2018-04-06 DIAGNOSIS — Z0000 Encounter for general adult medical examination without abnormal findings: Secondary | ICD-10-CM | POA: Diagnosis not present

## 2018-04-06 DIAGNOSIS — E559 Vitamin D deficiency, unspecified: Secondary | ICD-10-CM

## 2018-04-06 DIAGNOSIS — I25810 Atherosclerosis of coronary artery bypass graft(s) without angina pectoris: Secondary | ICD-10-CM

## 2018-04-06 DIAGNOSIS — J449 Chronic obstructive pulmonary disease, unspecified: Secondary | ICD-10-CM | POA: Diagnosis not present

## 2018-04-06 DIAGNOSIS — K219 Gastro-esophageal reflux disease without esophagitis: Secondary | ICD-10-CM | POA: Diagnosis not present

## 2018-04-06 DIAGNOSIS — F419 Anxiety disorder, unspecified: Secondary | ICD-10-CM

## 2018-04-06 DIAGNOSIS — E119 Type 2 diabetes mellitus without complications: Secondary | ICD-10-CM | POA: Diagnosis not present

## 2018-04-06 DIAGNOSIS — I442 Atrioventricular block, complete: Secondary | ICD-10-CM

## 2018-04-06 DIAGNOSIS — I482 Chronic atrial fibrillation, unspecified: Secondary | ICD-10-CM | POA: Diagnosis not present

## 2018-04-06 DIAGNOSIS — M169 Osteoarthritis of hip, unspecified: Secondary | ICD-10-CM | POA: Insufficient documentation

## 2018-04-06 DIAGNOSIS — M160 Bilateral primary osteoarthritis of hip: Secondary | ICD-10-CM

## 2018-04-06 LAB — LDL CHOLESTEROL, DIRECT: LDL DIRECT: 68 mg/dL

## 2018-04-06 LAB — BASIC METABOLIC PANEL
BUN: 36 mg/dL — ABNORMAL HIGH (ref 6–23)
CO2: 27 mEq/L (ref 19–32)
Calcium: 9.4 mg/dL (ref 8.4–10.5)
Chloride: 105 mEq/L (ref 96–112)
Creatinine, Ser: 1.68 mg/dL — ABNORMAL HIGH (ref 0.40–1.20)
GFR: 29.28 mL/min — ABNORMAL LOW (ref >60.00)
Glucose, Bld: 107 mg/dL — ABNORMAL HIGH (ref 70–99)
Potassium: 4.1 mEq/L (ref 3.5–5.1)
Sodium: 141 mEq/L (ref 135–145)

## 2018-04-06 LAB — LIPID PANEL
Cholesterol: 142 mg/dL (ref 0–200)
HDL: 38.4 mg/dL — ABNORMAL LOW (ref >39.00)
NonHDL: 103.28
Total CHOL/HDL Ratio: 4
Triglycerides: 231 mg/dL — ABNORMAL HIGH (ref 0.0–149.0)
VLDL: 46.2 mg/dL — ABNORMAL HIGH (ref 0.0–40.0)

## 2018-04-06 LAB — HEMOGLOBIN A1C: Hgb A1c MFr Bld: 6.5 % (ref 4.6–6.5)

## 2018-04-06 LAB — VITAMIN D 25 HYDROXY (VIT D DEFICIENCY, FRACTURES): VITD: 41.42 ng/mL (ref 30.00–100.00)

## 2018-04-06 NOTE — Assessment & Plan Note (Signed)
CMET today Continue Monpril She will continue to follow with nephrology

## 2018-04-06 NOTE — Assessment & Plan Note (Signed)
Encouraged smoking cessation She declines No indication for inhalers at this time

## 2018-04-06 NOTE — Assessment & Plan Note (Signed)
CMET and lipid profile today Continue Atorvastatin Encouraged her to consume a low fat diet

## 2018-04-06 NOTE — Assessment & Plan Note (Signed)
Has pacemaker  

## 2018-04-06 NOTE — Assessment & Plan Note (Signed)
Continue Ranitidine and TUMS CBC and CMET today

## 2018-04-06 NOTE — Patient Instructions (Signed)
Health Maintenance After Age 81 After age 81, you are at a higher risk for certain long-term diseases and infections as well as injuries from falls. Falls are a major cause of broken bones and head injuries in people who are older than age 81. Getting regular preventive care can help to keep you healthy and well. Preventive care includes getting regular testing and making lifestyle changes as recommended by your health care provider. Talk with your health care provider about:  Which screenings and tests you should have. A screening is a test that checks for a disease when you have no symptoms.  A diet and exercise plan that is right for you. What should I know about screenings and tests to prevent falls? Screening and testing are the best ways to find a health problem early. Early diagnosis and treatment give you the best chance of managing medical conditions that are common after age 81. Certain conditions and lifestyle choices may make you more likely to have a fall. Your health care provider may recommend:  Regular vision checks. Poor vision and conditions such as cataracts can make you more likely to have a fall. If you wear glasses, make sure to get your prescription updated if your vision changes.  Medicine review. Work with your health care provider to regularly review all of the medicines you are taking, including over-the-counter medicines. Ask your health care provider about any side effects that may make you more likely to have a fall. Tell your health care provider if any medicines that you take make you feel dizzy or sleepy.  Osteoporosis screening. Osteoporosis is a condition that causes the bones to get weaker. This can make the bones weak and cause them to break more easily.  Blood pressure screening. Blood pressure changes and medicines to control blood pressure can make you feel dizzy.  Strength and balance checks. Your health care provider may recommend certain tests to check your  strength and balance while standing, walking, or changing positions.  Foot health exam. Foot pain and numbness, as well as not wearing proper footwear, can make you more likely to have a fall.  Depression screening. You may be more likely to have a fall if you have a fear of falling, feel emotionally low, or feel unable to do activities that you used to do.  Alcohol use screening. Using too much alcohol can affect your balance and may make you more likely to have a fall. What actions can I take to lower my risk of falls? General instructions  Talk with your health care provider about your risks for falling. Tell your health care provider if: ? You fall. Be sure to tell your health care provider about all falls, even ones that seem minor. ? You feel dizzy, sleepy, or off-balance.  Take over-the-counter and prescription medicines only as told by your health care provider. These include any supplements.  Eat a healthy diet and maintain a healthy weight. A healthy diet includes low-fat dairy products, low-fat (lean) meats, and fiber from whole grains, beans, and lots of fruits and vegetables. Home safety  Remove any tripping hazards, such as rugs, cords, and clutter.  Install safety equipment such as grab bars in bathrooms and safety rails on stairs.  Keep rooms and walkways well-lit. Activity   Follow a regular exercise program to stay fit. This will help you maintain your balance. Ask your health care provider what types of exercise are appropriate for you.  If you need a cane or   walker, use it as recommended by your health care provider.  Wear supportive shoes that have nonskid soles. Lifestyle  Do not drink alcohol if your health care provider tells you not to drink.  If you drink alcohol, limit how much you have: ? 0-1 drink a day for women. ? 0-2 drinks a day for men.  Be aware of how much alcohol is in your drink. In the [place] one drink equals one typical bottle of beer (12  oz), one-half glass of wine (5 oz), or one shot of hard liquor (1 oz).  Do not use any products that contain nicotine or tobacco, such as cigarettes and e-cigarettes. If you need help quitting, ask your health care provider. Summary  Having a healthy lifestyle and getting preventive care can help to protect your health and wellness after age 81.  Screening and testing are the best way to find a health problem early and help you avoid having a fall. Early diagnosis and treatment give you the best chance for managing medical conditions that are more common for people who are older than age 81.  Falls are a major cause of broken bones and head injuries in people who are older than age 81. Take precautions to prevent a fall at home.  Work with your health care provider to learn what changes you can make to improve your health and wellness and to prevent falls. This information is not intended to replace advice given to you by your health care provider. Make sure you discuss any questions you have with your health care provider. Document Released: 12/30/2016 Document Revised: 12/30/2016 Document Reviewed: 12/30/2016 Elsevier Interactive Patient Education  2019 [employer] Inc[employer]  
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
## 2018-04-06 NOTE — Assessment & Plan Note (Signed)
Stable on Wellbutrin and Ativan Support offered today Will monitor

## 2018-04-06 NOTE — Assessment & Plan Note (Signed)
Compensated Continue Amlodipine, Monopril, Hydralazine, HCTZ, Furosemide and Potassium CBC and CMET today She will continue to follow with cardiology

## 2018-04-06 NOTE — Assessment & Plan Note (Signed)
Continue Amlodipine, Imdur, Diltiazem, HCTZ and Furosemide Continue to follow with cardiology

## 2018-04-06 NOTE — Assessment & Plan Note (Signed)
Continue Amiodarone, Metoprolol and Eliquis CBC and CMET today She will continue to follow with cardiology

## 2018-04-06 NOTE — Assessment & Plan Note (Signed)
A1C today No microalbumin secondary to ACEI therapy Encouraged her to consume a low carb diet Foot exam today Encouraged yearly eye exam Flu and pneumovax and prevnar UTD

## 2018-04-14 ENCOUNTER — Other Ambulatory Visit: Payer: Self-pay | Admitting: Internal Medicine

## 2018-04-18 IMAGING — CR DG HIP (WITH OR WITHOUT PELVIS) 2-3V*R*
3 series · 3 of 3 positions shown · non-contrast
Comparison: Radiograph dated 11/28/2015

CLINICAL DATA: 80-year-old female with right hip dislocation.

EXAM:
DG HIP (WITH OR WITHOUT PELVIS) 2-3V RIGHT

[pelvis ap]
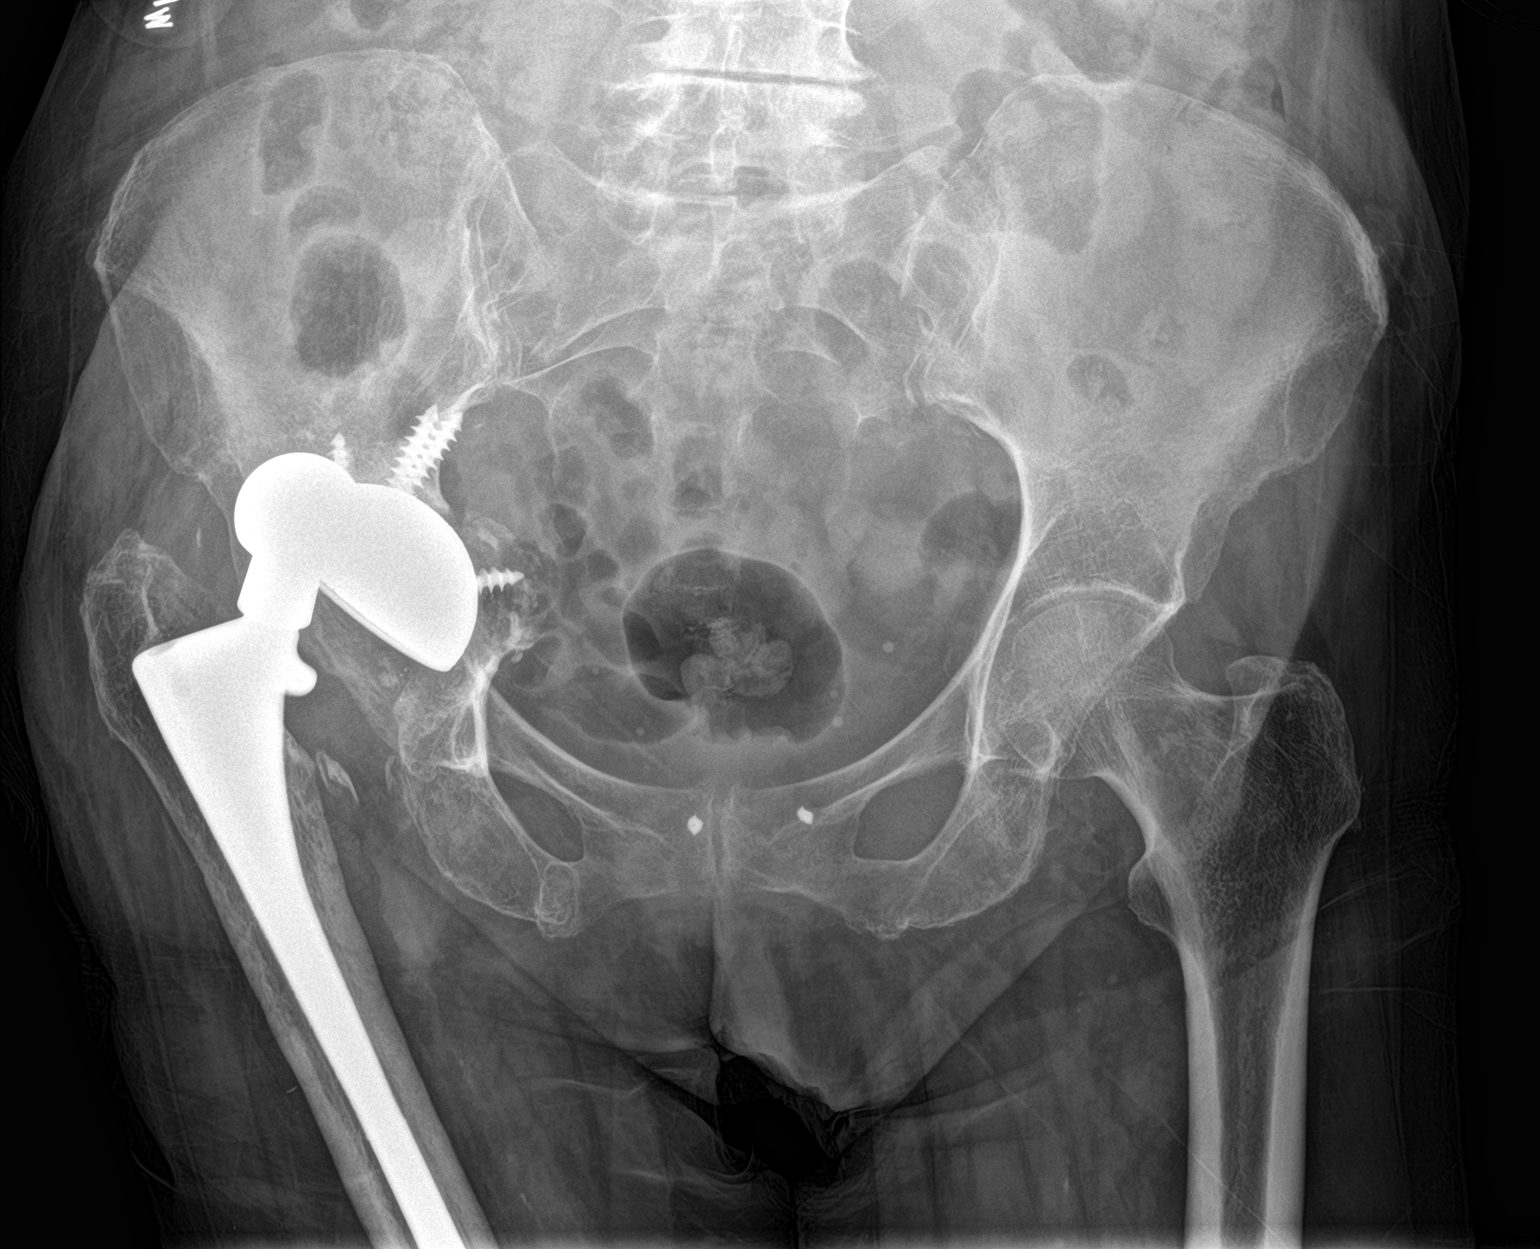

[hip ap]
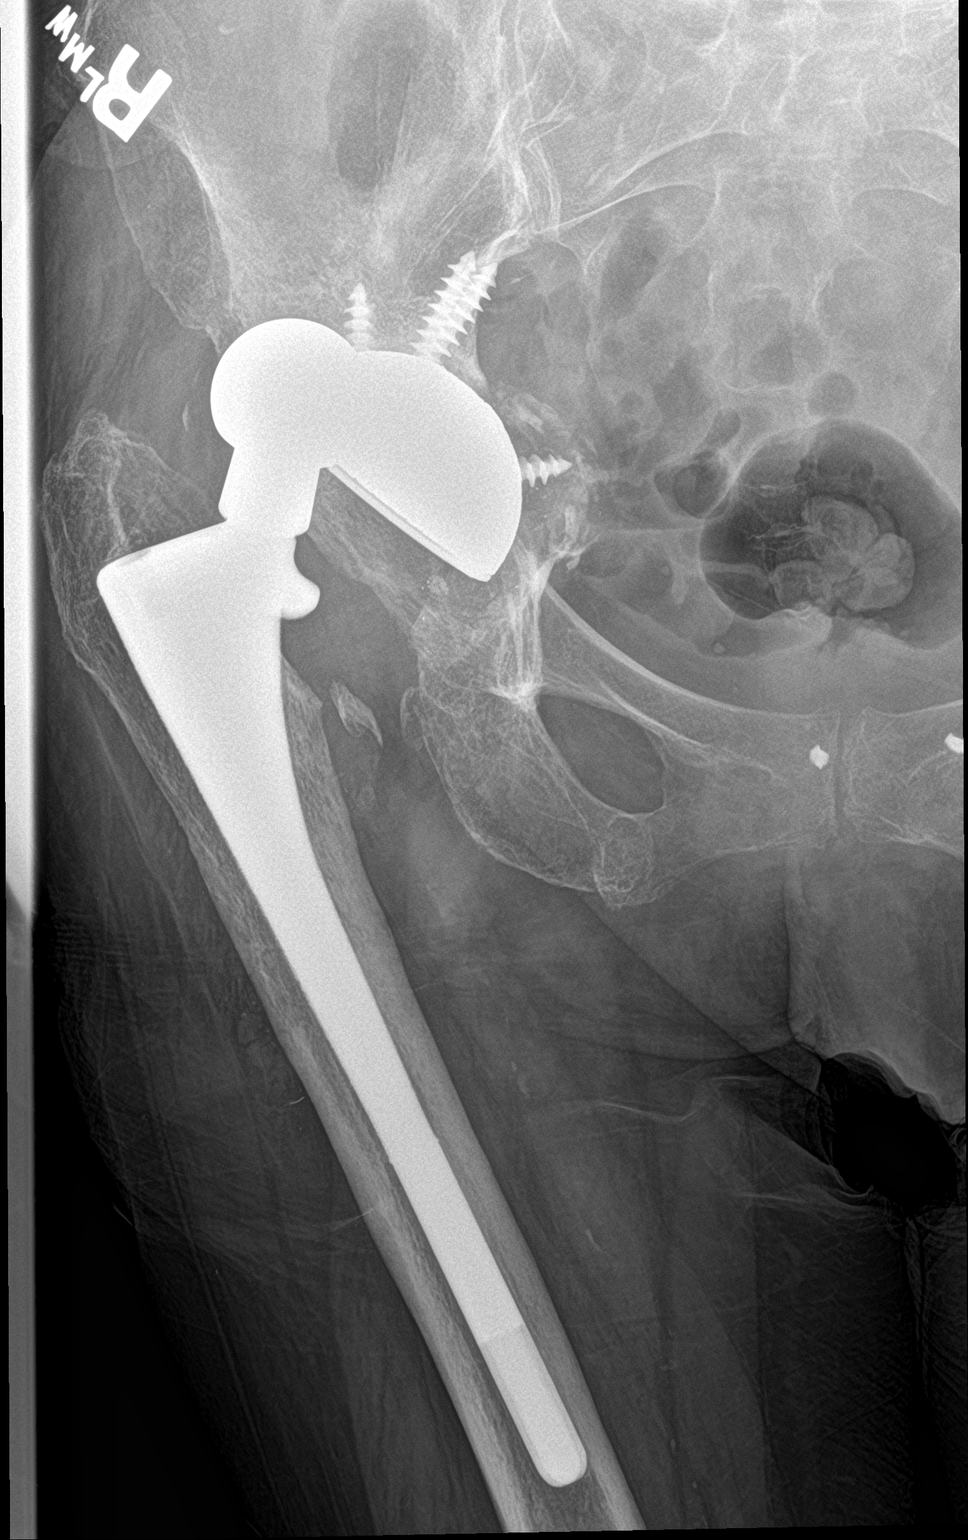

[hip lat]
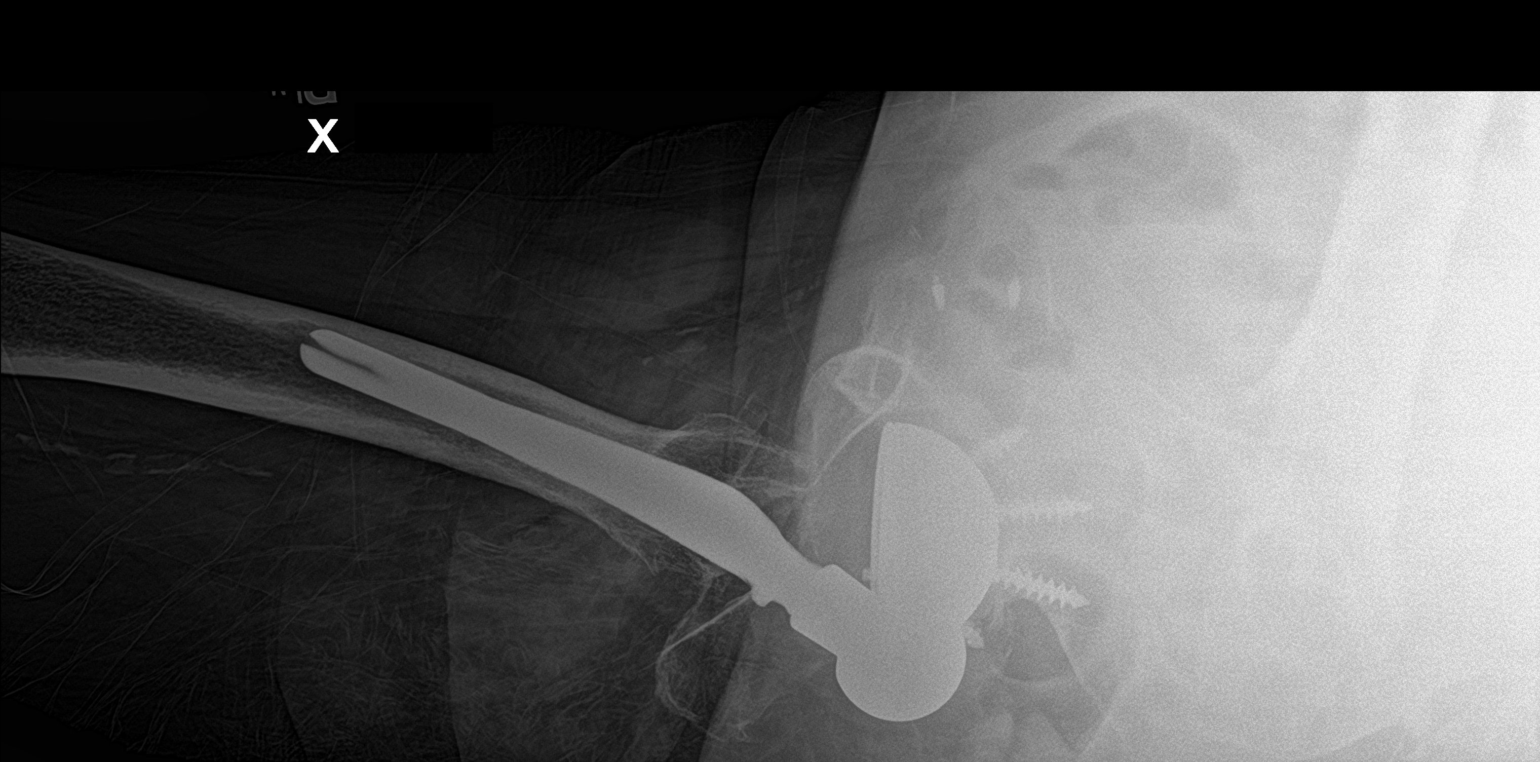

[3 of 3 positions shown; findings below may reference images not displayed]

FINDINGS: There is a total right hip arthroplasty. There is superior
dislocation of the femoral component with the femoral head
positioned superior to the acetabular cup. No acute fracture
identified. There are chronic changes of the medial wall of the
right acetabulum with protrusio acetabula and fragmentation similar
to prior study. The bones are osteopenic. The soft tissues appear
unremarkable.
IMPRESSION: Superiorly dislocated right hip arthroplasty.  No acute fracture.

## 2018-04-25 ENCOUNTER — Other Ambulatory Visit: Payer: Self-pay | Admitting: Internal Medicine

## 2018-04-30 ENCOUNTER — Other Ambulatory Visit (HOSPITAL_COMMUNITY): Payer: Self-pay | Admitting: Internal Medicine

## 2018-05-04 ENCOUNTER — Ambulatory Visit (HOSPITAL_COMMUNITY)
Admission: RE | Admit: 2018-05-04 | Discharge: 2018-05-04 | Disposition: A | Discharge status: Home / Self Care | Payer: Medicare Other | Source: Ambulatory Visit | Attending: Internal Medicine | Admitting: Internal Medicine

## 2018-05-04 ENCOUNTER — Ambulatory Visit (HOSPITAL_BASED_OUTPATIENT_CLINIC_OR_DEPARTMENT_OTHER)
Admission: RE | Admit: 2018-05-04 | Discharge: 2018-05-04 | Disposition: A | Discharge status: Home / Self Care | Payer: Medicare Other | Source: Ambulatory Visit | Attending: Internal Medicine | Admitting: Internal Medicine

## 2018-05-04 VITALS — BP 137/54 | HR 73 | Wt 152.8 lb

## 2018-05-04 DIAGNOSIS — Z950 Presence of cardiac pacemaker: Secondary | ICD-10-CM | POA: Insufficient documentation

## 2018-05-04 DIAGNOSIS — I110 Hypertensive heart disease with heart failure: Secondary | ICD-10-CM | POA: Insufficient documentation

## 2018-05-04 DIAGNOSIS — I2510 Atherosclerotic heart disease of native coronary artery without angina pectoris: Secondary | ICD-10-CM | POA: Diagnosis not present

## 2018-05-04 DIAGNOSIS — I5042 Chronic combined systolic (congestive) and diastolic (congestive) heart failure: Secondary | ICD-10-CM | POA: Insufficient documentation

## 2018-05-04 DIAGNOSIS — E119 Type 2 diabetes mellitus without complications: Secondary | ICD-10-CM | POA: Diagnosis not present

## 2018-05-04 DIAGNOSIS — Z720 Tobacco use: Secondary | ICD-10-CM

## 2018-05-04 DIAGNOSIS — J449 Chronic obstructive pulmonary disease, unspecified: Secondary | ICD-10-CM | POA: Insufficient documentation

## 2018-05-04 DIAGNOSIS — I480 Paroxysmal atrial fibrillation: Secondary | ICD-10-CM

## 2018-05-04 DIAGNOSIS — Z951 Presence of aortocoronary bypass graft: Secondary | ICD-10-CM | POA: Insufficient documentation

## 2018-05-04 DIAGNOSIS — I4891 Unspecified atrial fibrillation: Secondary | ICD-10-CM | POA: Diagnosis not present

## 2018-05-04 DIAGNOSIS — I081 Rheumatic disorders of both mitral and tricuspid valves: Secondary | ICD-10-CM | POA: Insufficient documentation

## 2018-05-04 NOTE — Progress Notes (Signed)
Echocardiogram 2D Echocardiogram has been performed.  Wanda Brooks 05/04/2018, 9:23 AM

## 2018-05-04 NOTE — Patient Instructions (Signed)
No medication changes today!!!  Your physician recommends that you schedule a follow-up appointment in: 4 months. Office will call you to schedule this appointment.
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
## 2018-05-08 ENCOUNTER — Other Ambulatory Visit: Payer: Self-pay | Admitting: Internal Medicine

## 2018-05-11 ENCOUNTER — Other Ambulatory Visit: Payer: Self-pay | Admitting: Internal Medicine

## 2018-05-12 NOTE — Telephone Encounter (Signed)
Last filled 03/30/2018... please advise

## 2018-05-18 ENCOUNTER — Other Ambulatory Visit: Payer: Self-pay | Admitting: Internal Medicine

## 2018-05-18 MED ORDER — HYDROCODONE-ACETAMINOPHEN 7.5-325 MG PO TABS: 1.0000 | ORAL_TABLET | Freq: Two times a day (BID) | ORAL | 0 refills | Status: DC | PRN

## 2018-05-18 NOTE — Telephone Encounter (Signed)
Patient's daughter,Jackie,called.  Patient needs a refill on her Hydrocodone.  Patient uses [facility].  Patient has no pills left.

## 2018-05-18 NOTE — Telephone Encounter (Signed)
Last filled 01/25/2019... please advise

## 2018-05-20 ENCOUNTER — Telehealth: Payer: Self-pay | Admitting: Internal Medicine

## 2018-05-20 ENCOUNTER — Encounter: Payer: Self-pay | Admitting: Internal Medicine

## 2018-05-20 NOTE — Telephone Encounter (Signed)
Noted- routing to COVID-19 r/s pool.

## 2018-05-20 NOTE — Progress Notes (Signed)
Noted- will route to COVID-19 pool

## 2018-05-20 NOTE — Progress Notes (Signed)
Called and spoke to pt regarding appointment 05/24/18 for followup of her CHF and device upgrade   Currently symptoms are quiescient   Discussed rescheduling of the appt 8-12 weeks from now  Pt is agreeable and advised to call if interval problems

## 2018-05-20 NOTE — Telephone Encounter (Signed)
Wanda Brooks, MDPhysician 2:07 PM    Edit         Called and spoke to pt regarding appointment 05/24/18 for followup of her CHF and device upgrade   Currently symptoms are quiescient   Discussed rescheduling of the appt 8-12 weeks from now  Pt is agreeable and advised to call if interval problems

## 2018-05-20 NOTE — Progress Notes (Signed)
Spoke with patient's daughter, [name] ([place]). Attempted to assist her with a transmission, but received error code [id number] (reader needs to charge). Advised they can try again over the weekend and we will call back if transmission is not received by Monday.

## 2018-05-23 ENCOUNTER — Telehealth: Payer: Self-pay

## 2018-05-23 NOTE — Telephone Encounter (Signed)
Patient daughter did try to send the manual transmission again but received an error code again. I gave her the number to [employer] tech support for further assistance.

## 2018-05-23 NOTE — Progress Notes (Signed)
Spoke w/ pt daughter and requested that pt send a manual transmission. Pt daughter said she would call back around 4 to get assistance w/ sending the transmission.

## 2018-05-24 ENCOUNTER — Encounter: Payer: Medicare Other | Admitting: Internal Medicine

## 2018-05-24 NOTE — Telephone Encounter (Signed)
Awaiting manual transmission as per Dr. Olin Pia request (see documentation encounter from 05/20/18). Online request submitted for [employer] tech services to contact patient/daughter for further troubleshooting.

## 2018-05-24 NOTE — Progress Notes (Signed)
See phone note opened 05/23/18.

## 2018-05-25 ENCOUNTER — Telehealth: Payer: Self-pay

## 2018-05-25 NOTE — Telephone Encounter (Signed)
PA has been submitted via fax to [facility].... awaiting response

## 2018-05-26 NOTE — Telephone Encounter (Signed)
Additional questions for PA was faxed last night

## 2018-05-26 NOTE — Telephone Encounter (Signed)
Per [employer], new monitor ordered on 05/26/18.

## 2018-05-26 NOTE — Telephone Encounter (Signed)
Annn from [facility] left v/m requesting cb for additional info needed for prior auth # [id number].

## 2018-06-01 NOTE — Telephone Encounter (Signed)
Transmission received. Normal device function. 1.6% AF burden. Story - Eliquis. LV threshold (HIS bundle lead) chronically high.   Chanetta Marshall, NP 06/01/2018 8:51 AM

## 2018-06-23 ENCOUNTER — Other Ambulatory Visit: Payer: Self-pay | Admitting: Internal Medicine

## 2018-06-28 NOTE — Telephone Encounter (Signed)
Last filled 10/2017... last OV 04/2018 AWV... please advise

## 2018-07-07 ENCOUNTER — Other Ambulatory Visit: Payer: Self-pay | Admitting: Internal Medicine

## 2018-07-07 NOTE — Telephone Encounter (Signed)
83 yo, 69.3kg, Scr 1.68 on 04/06/18 Last OV 05/04/18 Indication afib

## 2018-07-08 ENCOUNTER — Other Ambulatory Visit: Payer: Self-pay | Admitting: Internal Medicine

## 2018-07-30 ENCOUNTER — Other Ambulatory Visit (HOSPITAL_COMMUNITY): Payer: Self-pay | Admitting: Internal Medicine

## 2018-07-31 ENCOUNTER — Other Ambulatory Visit: Payer: Self-pay | Admitting: Internal Medicine

## 2018-08-01 ENCOUNTER — Ambulatory Visit (INDEPENDENT_AMBULATORY_CARE_PROVIDER_SITE_OTHER): Payer: Medicare Other | Admitting: *Deleted

## 2018-08-01 DIAGNOSIS — I442 Atrioventricular block, complete: Secondary | ICD-10-CM

## 2018-08-02 LAB — CUP PACEART REMOTE DEVICE CHECK
Battery Remaining Longevity: 51 mo
Battery Voltage: 2.95 V
Brady Statistic AP VP Percent: 99.89 %
Brady Statistic AP VS Percent: 0 %
Brady Statistic AS VP Percent: 0.08 %
Brady Statistic AS VS Percent: 0.03 %
Brady Statistic RA Percent Paced: 99.91 %
Brady Statistic RV Percent Paced: 99.97 %
Date Time Interrogation Session: 20200601142412
Implantable Lead Implant Date: 20130529
Implantable Lead Implant Date: 20130529
Implantable Lead Implant Date: 20191009
Implantable Lead Location: 753859
Implantable Lead Location: 753860
Implantable Lead Location: 753860
Implantable Lead Model: 3830
Implantable Lead Model: 4074
Implantable Lead Model: 4574
Implantable Pulse Generator Implant Date: 20191009
Lead Channel Impedance Value: 228 Ohm
Lead Channel Impedance Value: 285 Ohm
Lead Channel Impedance Value: 323 Ohm
Lead Channel Impedance Value: 342 Ohm
Lead Channel Impedance Value: 342 Ohm
Lead Channel Impedance Value: 380 Ohm
Lead Channel Impedance Value: 380 Ohm
Lead Channel Impedance Value: 399 Ohm
Lead Channel Impedance Value: 418 Ohm
Lead Channel Pacing Threshold Amplitude: 1.875 V
Lead Channel Pacing Threshold Pulse Width: 1.5 ms
Lead Channel Sensing Intrinsic Amplitude: 0.625 mV
Lead Channel Sensing Intrinsic Amplitude: 0.625 mV
Lead Channel Setting Pacing Amplitude: 2.5 V
Lead Channel Setting Pacing Amplitude: 2.5 V
Lead Channel Setting Pacing Amplitude: 2.5 V
Lead Channel Setting Pacing Pulse Width: 1 ms
Lead Channel Setting Pacing Pulse Width: 1.5 ms
Lead Channel Setting Sensing Sensitivity: 4 mV

## 2018-08-02 MED ORDER — HYDROCODONE-ACETAMINOPHEN 7.5-325 MG PO TABS: 1.0000 | ORAL_TABLET | Freq: Two times a day (BID) | ORAL | 0 refills | Status: DC | PRN

## 2018-08-02 MED ORDER — LORAZEPAM 0.5 MG PO TABS: 0.5000 mg | ORAL_TABLET | Freq: Every day | ORAL | 0 refills | Status: DC | PRN

## 2018-08-02 NOTE — Telephone Encounter (Signed)
Ativan last filled 05/12/2018... Norco last filled 05/18/2018... please advise

## 2018-08-09 ENCOUNTER — Encounter: Payer: Self-pay | Admitting: Cardiology

## 2018-08-09 NOTE — Progress Notes (Signed)
Remote pacemaker transmission.   

## 2018-09-05 ENCOUNTER — Other Ambulatory Visit: Payer: Self-pay | Admitting: Internal Medicine

## 2018-09-20 DIAGNOSIS — H903 Sensorineural hearing loss, bilateral: Secondary | ICD-10-CM | POA: Diagnosis not present

## 2018-09-22 ENCOUNTER — Other Ambulatory Visit: Payer: Self-pay | Admitting: Internal Medicine

## 2018-10-05 ENCOUNTER — Other Ambulatory Visit: Payer: Self-pay | Admitting: Internal Medicine

## 2018-10-06 ENCOUNTER — Other Ambulatory Visit: Payer: Self-pay

## 2018-10-06 ENCOUNTER — Encounter: Payer: Self-pay | Admitting: Internal Medicine

## 2018-10-06 ENCOUNTER — Ambulatory Visit (INDEPENDENT_AMBULATORY_CARE_PROVIDER_SITE_OTHER): Payer: Medicare Other | Admitting: Internal Medicine

## 2018-10-06 DIAGNOSIS — J439 Emphysema, unspecified: Secondary | ICD-10-CM | POA: Diagnosis not present

## 2018-10-06 DIAGNOSIS — K219 Gastro-esophageal reflux disease without esophagitis: Secondary | ICD-10-CM

## 2018-10-06 DIAGNOSIS — E7800 Pure hypercholesterolemia, unspecified: Secondary | ICD-10-CM

## 2018-10-06 DIAGNOSIS — N3281 Overactive bladder: Secondary | ICD-10-CM

## 2018-10-06 DIAGNOSIS — F32A Depression, unspecified: Secondary | ICD-10-CM

## 2018-10-06 DIAGNOSIS — I25810 Atherosclerosis of coronary artery bypass graft(s) without angina pectoris: Secondary | ICD-10-CM

## 2018-10-06 DIAGNOSIS — I482 Chronic atrial fibrillation, unspecified: Secondary | ICD-10-CM | POA: Diagnosis not present

## 2018-10-06 DIAGNOSIS — F419 Anxiety disorder, unspecified: Secondary | ICD-10-CM | POA: Diagnosis not present

## 2018-10-06 DIAGNOSIS — Z79899 Other long term (current) drug therapy: Secondary | ICD-10-CM

## 2018-10-06 DIAGNOSIS — M160 Bilateral primary osteoarthritis of hip: Secondary | ICD-10-CM | POA: Diagnosis not present

## 2018-10-06 DIAGNOSIS — I2510 Atherosclerotic heart disease of native coronary artery without angina pectoris: Secondary | ICD-10-CM

## 2018-10-06 DIAGNOSIS — I10 Essential (primary) hypertension: Secondary | ICD-10-CM | POA: Diagnosis not present

## 2018-10-06 DIAGNOSIS — N183 Chronic kidney disease, stage 3 unspecified: Secondary | ICD-10-CM

## 2018-10-06 DIAGNOSIS — E119 Type 2 diabetes mellitus without complications: Secondary | ICD-10-CM

## 2018-10-06 DIAGNOSIS — I5042 Chronic combined systolic (congestive) and diastolic (congestive) heart failure: Secondary | ICD-10-CM

## 2018-10-06 DIAGNOSIS — F329 Major depressive disorder, single episode, unspecified: Secondary | ICD-10-CM

## 2018-10-06 MED ORDER — OXYBUTYNIN CHLORIDE ER 10 MG PO TB24: 10.0000 mg | ORAL_TABLET | Freq: Every day | ORAL | 3 refills | Status: DC

## 2018-10-06 MED ORDER — HYDROCODONE-ACETAMINOPHEN 7.5-325 MG PO TABS: 1.0000 | ORAL_TABLET | Freq: Two times a day (BID) | ORAL | 0 refills | Status: DC | PRN

## 2018-10-06 MED ORDER — LORAZEPAM 0.5 MG PO TABS: 0.5000 mg | ORAL_TABLET | Freq: Every day | ORAL | 0 refills | Status: DC | PRN

## 2018-10-06 NOTE — Assessment & Plan Note (Signed)
A1C reviewed No microalbumin secondary to ACEI therapy Encouraged her to consume a low carb diet Foot exam today Encouraged yearly eye exams Flu UTD Pneumonia UTD

## 2018-10-06 NOTE — Assessment & Plan Note (Signed)
More urinary frequency Increase Ditropan to 10 mg XL daily Will monitor

## 2018-10-06 NOTE — Assessment & Plan Note (Signed)
Controlled on Hydralazine, Amlodipine, Amiodarone, Metoprolol, HCTZ and Lasix Reinforced DASH diet CMET reviewed

## 2018-10-06 NOTE — Assessment & Plan Note (Signed)
Paced Continue Amiodarone, Metoprolol and Eliquis She will continue to follow with cardiology

## 2018-10-06 NOTE — Assessment & Plan Note (Addendum)
Consider Glucosamine Chondroitin Continue Tylenol Norco refilled today CSA today, no UDS
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
## 2018-10-06 NOTE — Patient Instructions (Signed)

## 2018-10-06 NOTE — Assessment & Plan Note (Signed)
CMET and Lipid profile reviewed Continue Atorvastatin, Imdur, Metoprolol, and Eliquis. Encouraged low fat diet She will continue to follow with cardiology

## 2018-10-06 NOTE — Assessment & Plan Note (Signed)
Continue Wellbutrin and Ativan, Ativan refilled today CSA updated today, no UDS per my decision Support offered Will monitor

## 2018-10-06 NOTE — Assessment & Plan Note (Signed)
Compensated Encouraged daily weights, call me if > 3 lbs weight gain in a day or > 5 lbs in a week Encouraged low salt diet Continue Metoprolol, Amlodipine, Monopril, Hydralazine, HCTZ, Lasix and Potassium She will continue to follow with cardiology

## 2018-10-06 NOTE — Assessment & Plan Note (Signed)
Doing well on Esomprazole CBC and CMET reviewed Will monitor

## 2018-10-06 NOTE — Assessment & Plan Note (Signed)
BMET reviewed Encouraged adequate water intake Continue Monpril She will continue to follow with nephrology

## 2018-10-06 NOTE — Assessment & Plan Note (Signed)
Encouraged smoking cessation She is not currently using any inhalers Will monitor She does not follow with pulmonology

## 2018-10-07 NOTE — Progress Notes (Signed)
   Subjective:    Patient ID: Wanda Brooks, female    DOB: 14-Jan-1936, 84 y.o.   MRN: [id number]  HPI Reviewed and agree with current plan   Review of Systems     Objective:   Physical Exam         Assessment & Plan:

## 2018-10-31 ENCOUNTER — Ambulatory Visit (INDEPENDENT_AMBULATORY_CARE_PROVIDER_SITE_OTHER): Payer: Medicare Other | Admitting: *Deleted

## 2018-10-31 ENCOUNTER — Other Ambulatory Visit: Payer: Self-pay

## 2018-10-31 ENCOUNTER — Other Ambulatory Visit: Payer: Self-pay | Admitting: Internal Medicine

## 2018-10-31 DIAGNOSIS — I442 Atrioventricular block, complete: Secondary | ICD-10-CM | POA: Diagnosis not present

## 2018-10-31 LAB — CUP PACEART REMOTE DEVICE CHECK
Battery Remaining Longevity: 47 mo
Battery Voltage: 2.95 V
Brady Statistic AP VP Percent: 88.33 %
Brady Statistic AP VS Percent: 0 %
Brady Statistic AS VP Percent: 11.65 %
Brady Statistic AS VS Percent: 0.01 %
Brady Statistic RA Percent Paced: 88.09 %
Brady Statistic RV Percent Paced: 99.98 %
Date Time Interrogation Session: 20200831075351
Implantable Lead Implant Date: 20130529
Implantable Lead Implant Date: 20130529
Implantable Lead Implant Date: 20191009
Implantable Lead Location: 753859
Implantable Lead Location: 753860
Implantable Lead Location: 753860
Implantable Lead Model: 3830
Implantable Lead Model: 4074
Implantable Lead Model: 4574
Implantable Pulse Generator Implant Date: 20191009
Lead Channel Impedance Value: 266 Ohm
Lead Channel Impedance Value: 304 Ohm
Lead Channel Impedance Value: 342 Ohm
Lead Channel Impedance Value: 361 Ohm
Lead Channel Impedance Value: 380 Ohm
Lead Channel Impedance Value: 380 Ohm
Lead Channel Impedance Value: 380 Ohm
Lead Channel Impedance Value: 399 Ohm
Lead Channel Impedance Value: 437 Ohm
Lead Channel Pacing Threshold Amplitude: 2.5 V
Lead Channel Pacing Threshold Pulse Width: 1.5 ms
Lead Channel Sensing Intrinsic Amplitude: 1 mV
Lead Channel Sensing Intrinsic Amplitude: 1 mV
Lead Channel Setting Pacing Amplitude: 2.5 V
Lead Channel Setting Pacing Amplitude: 2.5 V
Lead Channel Setting Pacing Amplitude: 2.5 V
Lead Channel Setting Pacing Pulse Width: 1 ms
Lead Channel Setting Pacing Pulse Width: 1.5 ms
Lead Channel Setting Sensing Sensitivity: 4 mV

## 2018-10-31 MED ORDER — FUROSEMIDE 40 MG PO TABS: ORAL_TABLET | ORAL | 0 refills | Status: DC

## 2018-11-02 DIAGNOSIS — M25562 Pain in left knee: Secondary | ICD-10-CM | POA: Diagnosis not present

## 2018-11-02 DIAGNOSIS — M25571 Pain in right ankle and joints of right foot: Secondary | ICD-10-CM | POA: Diagnosis not present

## 2018-11-05 ENCOUNTER — Other Ambulatory Visit: Payer: Self-pay | Admitting: Internal Medicine

## 2018-11-09 ENCOUNTER — Encounter: Payer: Self-pay | Admitting: Cardiology

## 2018-11-09 NOTE — Progress Notes (Signed)
Remote pacemaker transmission.   

## 2018-11-25 ENCOUNTER — Other Ambulatory Visit (HOSPITAL_COMMUNITY): Payer: Self-pay | Admitting: Internal Medicine

## 2018-11-25 NOTE — Telephone Encounter (Signed)
This is CHF pt

## 2018-11-29 ENCOUNTER — Telehealth: Payer: Self-pay | Admitting: Internal Medicine

## 2018-11-29 ENCOUNTER — Other Ambulatory Visit: Payer: Self-pay | Admitting: Internal Medicine

## 2018-11-29 NOTE — Telephone Encounter (Signed)
Norco and Ativan last filled 10/06/2018... please advise

## 2018-11-30 MED ORDER — HYDROCODONE-ACETAMINOPHEN 7.5-325 MG PO TABS: 1.0000 | ORAL_TABLET | Freq: Two times a day (BID) | ORAL | 0 refills | Status: DC | PRN

## 2018-11-30 MED ORDER — LORAZEPAM 0.5 MG PO TABS: 0.5000 mg | ORAL_TABLET | Freq: Every day | ORAL | 0 refills | Status: DC | PRN

## 2018-12-02 ENCOUNTER — Encounter: Payer: Self-pay | Admitting: Family Medicine

## 2018-12-02 ENCOUNTER — Ambulatory Visit: Payer: Medicare Other | Admitting: Internal Medicine

## 2018-12-02 ENCOUNTER — Ambulatory Visit (INDEPENDENT_AMBULATORY_CARE_PROVIDER_SITE_OTHER): Payer: Medicare Other | Admitting: Family Medicine

## 2018-12-02 ENCOUNTER — Other Ambulatory Visit: Payer: Self-pay

## 2018-12-02 ENCOUNTER — Ambulatory Visit (INDEPENDENT_AMBULATORY_CARE_PROVIDER_SITE_OTHER)
Admission: RE | Admit: 2018-12-02 | Discharge: 2018-12-02 | Disposition: A | Discharge status: Home / Self Care | Payer: Medicare Other | Source: Ambulatory Visit | Attending: Family Medicine | Admitting: Family Medicine

## 2018-12-02 VITALS — BP 138/62 | HR 81 | Temp 98.6°F | Ht 65.0 in | Wt 157.4 lb

## 2018-12-02 DIAGNOSIS — L989 Disorder of the skin and subcutaneous tissue, unspecified: Secondary | ICD-10-CM

## 2018-12-02 DIAGNOSIS — M25571 Pain in right ankle and joints of right foot: Secondary | ICD-10-CM

## 2018-12-02 DIAGNOSIS — I2510 Atherosclerotic heart disease of native coronary artery without angina pectoris: Secondary | ICD-10-CM

## 2018-12-02 NOTE — Patient Instructions (Signed)
Good to see you today, I will send you a report when I get an xray reading of your ankle.   In the meantime, can wrap if it feels better, elevate when sitting, can take Tylenol 2 tablets every 8-12 hours and can use heat/ice if it feels good
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
## 2018-12-02 NOTE — Telephone Encounter (Signed)
This Rx was filled 11/30/2018

## 2018-12-02 NOTE — Telephone Encounter (Signed)
Wanda Brooks call pt is out of her meds can you refill

## 2018-12-03 ENCOUNTER — Encounter: Payer: Self-pay | Admitting: Internal Medicine

## 2018-12-03 ENCOUNTER — Encounter: Payer: Self-pay | Admitting: Family Medicine

## 2018-12-04 ENCOUNTER — Other Ambulatory Visit: Payer: Self-pay | Admitting: Internal Medicine

## 2018-12-05 NOTE — Telephone Encounter (Signed)
Please schedule overdue 6 mo F/U with Dr. Caryl Comes. Thank you!

## 2018-12-06 NOTE — Telephone Encounter (Signed)
msg sent via my chart

## 2018-12-09 ENCOUNTER — Other Ambulatory Visit: Payer: Self-pay | Admitting: Internal Medicine

## 2018-12-12 ENCOUNTER — Other Ambulatory Visit: Payer: Self-pay | Admitting: Internal Medicine

## 2018-12-12 MED ORDER — BUPROPION HCL 75 MG PO TABS: ORAL_TABLET | ORAL | 1 refills | Status: DC

## 2018-12-12 MED ORDER — ESOMEPRAZOLE MAGNESIUM 20 MG PO CPDR: 20.0000 mg | DELAYED_RELEASE_CAPSULE | Freq: Every day | ORAL | 1 refills | Status: DC

## 2018-12-12 NOTE — Telephone Encounter (Signed)
Please advise if ok to refill Hctz 12.5 mg qd. Medication last filled by PCP.

## 2018-12-20 ENCOUNTER — Ambulatory Visit (INDEPENDENT_AMBULATORY_CARE_PROVIDER_SITE_OTHER): Payer: Medicare Other

## 2018-12-20 DIAGNOSIS — Z23 Encounter for immunization: Secondary | ICD-10-CM | POA: Diagnosis not present

## 2018-12-27 ENCOUNTER — Encounter: Payer: Medicare Other | Admitting: Internal Medicine

## 2019-01-02 ENCOUNTER — Other Ambulatory Visit: Payer: Self-pay | Admitting: Internal Medicine

## 2019-01-11 DIAGNOSIS — L02419 Cutaneous abscess of limb, unspecified: Secondary | ICD-10-CM | POA: Diagnosis not present

## 2019-01-13 ENCOUNTER — Other Ambulatory Visit: Payer: Self-pay | Admitting: Internal Medicine

## 2019-01-13 NOTE — Telephone Encounter (Signed)
Last filled 11/30/2018.Marland KitchenMarland KitchenMarland Kitchen please advise

## 2019-01-15 MED ORDER — HYDROCODONE-ACETAMINOPHEN 7.5-325 MG PO TABS: 1.0000 | ORAL_TABLET | Freq: Two times a day (BID) | ORAL | 0 refills | Status: DC | PRN

## 2019-01-15 MED ORDER — LORAZEPAM 0.5 MG PO TABS: 0.5000 mg | ORAL_TABLET | Freq: Every day | ORAL | 0 refills | Status: DC | PRN

## 2019-01-17 ENCOUNTER — Other Ambulatory Visit: Payer: Self-pay | Admitting: Internal Medicine

## 2019-01-24 ENCOUNTER — Encounter: Payer: Self-pay | Admitting: Internal Medicine

## 2019-01-24 ENCOUNTER — Ambulatory Visit (INDEPENDENT_AMBULATORY_CARE_PROVIDER_SITE_OTHER): Payer: Medicare Other | Admitting: Internal Medicine

## 2019-01-24 ENCOUNTER — Other Ambulatory Visit: Payer: Self-pay | Admitting: Internal Medicine

## 2019-01-24 ENCOUNTER — Other Ambulatory Visit: Payer: Self-pay

## 2019-01-24 VITALS — BP 148/60 | HR 61 | Ht 65.0 in | Wt 155.0 lb

## 2019-01-24 DIAGNOSIS — I442 Atrioventricular block, complete: Secondary | ICD-10-CM | POA: Diagnosis not present

## 2019-01-24 DIAGNOSIS — Z950 Presence of cardiac pacemaker: Secondary | ICD-10-CM | POA: Diagnosis not present

## 2019-01-24 DIAGNOSIS — I5042 Chronic combined systolic (congestive) and diastolic (congestive) heart failure: Secondary | ICD-10-CM | POA: Diagnosis not present

## 2019-01-24 DIAGNOSIS — Z79899 Other long term (current) drug therapy: Secondary | ICD-10-CM | POA: Diagnosis not present

## 2019-01-24 DIAGNOSIS — I480 Paroxysmal atrial fibrillation: Secondary | ICD-10-CM

## 2019-01-24 DIAGNOSIS — I2510 Atherosclerotic heart disease of native coronary artery without angina pectoris: Secondary | ICD-10-CM

## 2019-01-24 MED ORDER — HYDRALAZINE HCL 25 MG PO TABS: ORAL_TABLET | ORAL | 3 refills | Status: DC
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
## 2019-01-24 NOTE — Patient Instructions (Signed)
Medication Instructions:  - Your physician recommends that you continue on your current medications as directed. Please refer to the Current Medication list given to you today.  *If you need a refill on your cardiac medications before your next appointment, please call your pharmacy*  Lab Work: - Your physician recommends that you have lab work today: CMET/ TSH/ CBC  If you have labs (blood work) drawn today and your tests are completely normal, you will receive your results only by: Marland Kitchen MyChart Message (if you have MyChart) OR . A paper copy in the mail If you have any lab test that is abnormal or we need to change your treatment, we will call you to review the results.  Testing/Procedures: - none ordered  Follow-Up: At [facility], you and your health needs are our priority.  As part of our continuing mission to provide you with exceptional heart care, we have created designated Provider Care Teams.  These Care Teams include your primary Cardiologist (physician) and Advanced Practice Providers (APPs -  Physician Assistants and Nurse Practitioners) who all work together to provide you with the care you need, when you need it.  Your next appointment:   6 month(s)  The format for your next appointment:   In Person  Provider:   Virl Axe, MD  Other Instructions n/a

## 2019-01-25 LAB — CBC WITH DIFFERENTIAL/PLATELET
Basophils Absolute: 0.2 10*3/uL (ref 0.0–0.2)
Basos: 2 %
EOS (ABSOLUTE): 0.3 10*3/uL (ref 0.0–0.4)
Eos: 3 %
Hematocrit: 38.5 % (ref 34.0–46.6)
Hemoglobin: 13.1 g/dL (ref 11.1–15.9)
Immature Grans (Abs): 0.1 10*3/uL (ref 0.0–0.1)
Immature Granulocytes: 1 %
Lymphocytes Absolute: 2.6 10*3/uL (ref 0.7–3.1)
Lymphs: 25 %
MCH: 32 pg (ref 26.6–33.0)
MCHC: 34 g/dL (ref 31.5–35.7)
MCV: 94 fL (ref 79–97)
Monocytes Absolute: 0.9 10*3/uL (ref 0.1–0.9)
Monocytes: 9 %
Neutrophils Absolute: 6.1 10*3/uL (ref 1.4–7.0)
Neutrophils: 60 %
Platelets: 255 10*3/uL (ref 150–450)
RBC: 4.09 x10E6/uL (ref 3.77–5.28)
RDW: 13.6 % (ref 11.7–15.4)
WBC: 10.2 10*3/uL (ref 3.4–10.8)

## 2019-01-25 LAB — COMPREHENSIVE METABOLIC PANEL
ALT: 24 IU/L (ref 0–32)
AST: 24 IU/L (ref 0–40)
Albumin/Globulin Ratio: 1.9 (ref 1.2–2.2)
Albumin: 4.3 g/dL (ref 3.6–4.6)
Alkaline Phosphatase: 47 IU/L (ref 39–117)
BUN/Creatinine Ratio: 18 (ref 12–28)
BUN: 40 mg/dL — ABNORMAL HIGH (ref 8–27)
Bilirubin Total: 0.3 mg/dL (ref 0.0–1.2)
CO2: 23 mmol/L (ref 20–29)
Calcium: 9 mg/dL (ref 8.7–10.3)
Chloride: 105 mmol/L (ref 96–106)
Creatinine, Ser: 2.2 mg/dL — ABNORMAL HIGH (ref 0.57–1.00)
GFR calc Af Amer: 24 mL/min/{1.73_m2} — ABNORMAL LOW (ref >59)
GFR calc non Af Amer: 20 mL/min/{1.73_m2} — ABNORMAL LOW (ref >59)
Globulin, Total: 2.3 g/dL (ref 1.5–4.5)
Glucose: 124 mg/dL — ABNORMAL HIGH (ref 65–99)
Potassium: 4.2 mmol/L (ref 3.5–5.2)
Sodium: 144 mmol/L (ref 134–144)
Total Protein: 6.6 g/dL (ref 6.0–8.5)

## 2019-01-25 LAB — TSH: TSH: 5.52 u[IU]/mL — ABNORMAL HIGH (ref 0.450–4.500)

## 2019-01-30 ENCOUNTER — Ambulatory Visit (INDEPENDENT_AMBULATORY_CARE_PROVIDER_SITE_OTHER): Payer: Medicare Other | Admitting: *Deleted

## 2019-01-30 DIAGNOSIS — I442 Atrioventricular block, complete: Secondary | ICD-10-CM

## 2019-01-30 LAB — CUP PACEART REMOTE DEVICE CHECK
Battery Remaining Longevity: 42 mo
Battery Voltage: 2.94 V
Brady Statistic AP VP Percent: 99.96 %
Brady Statistic AP VS Percent: 0.01 %
Brady Statistic AS VP Percent: 0.02 %
Brady Statistic AS VS Percent: 0.01 %
Brady Statistic RA Percent Paced: 99.98 %
Brady Statistic RV Percent Paced: 99.98 %
Date Time Interrogation Session: 20201130054121
Implantable Lead Implant Date: 20130529
Implantable Lead Implant Date: 20130529
Implantable Lead Implant Date: 20191009
Implantable Lead Location: 753859
Implantable Lead Location: 753860
Implantable Lead Location: 753860
Implantable Lead Model: 3830
Implantable Lead Model: 4074
Implantable Lead Model: 4574
Implantable Pulse Generator Implant Date: 20191009
Lead Channel Impedance Value: 304 Ohm
Lead Channel Impedance Value: 342 Ohm
Lead Channel Impedance Value: 342 Ohm
Lead Channel Impedance Value: 380 Ohm
Lead Channel Impedance Value: 380 Ohm
Lead Channel Impedance Value: 399 Ohm
Lead Channel Impedance Value: 456 Ohm
Lead Channel Impedance Value: 475 Ohm
Lead Channel Impedance Value: 475 Ohm
Lead Channel Pacing Threshold Amplitude: 2.5 V
Lead Channel Pacing Threshold Pulse Width: 1.5 ms
Lead Channel Sensing Intrinsic Amplitude: 0.625 mV
Lead Channel Sensing Intrinsic Amplitude: 1.5 mV
Lead Channel Setting Pacing Amplitude: 1.5 V
Lead Channel Setting Pacing Amplitude: 2.5 V
Lead Channel Setting Pacing Amplitude: 2.5 V
Lead Channel Setting Pacing Pulse Width: 1.3 ms
Lead Channel Setting Pacing Pulse Width: 1.5 ms
Lead Channel Setting Sensing Sensitivity: 4 mV

## 2019-02-07 ENCOUNTER — Telehealth: Payer: Self-pay | Admitting: Internal Medicine

## 2019-02-07 DIAGNOSIS — I480 Paroxysmal atrial fibrillation: Secondary | ICD-10-CM

## 2019-02-07 DIAGNOSIS — Z79899 Other long term (current) drug therapy: Secondary | ICD-10-CM

## 2019-02-07 DIAGNOSIS — N289 Disorder of kidney and ureter, unspecified: Secondary | ICD-10-CM

## 2019-02-07 MED ORDER — FUROSEMIDE 40 MG PO TABS: ORAL_TABLET | ORAL | 2 refills | Status: AC

## 2019-02-07 NOTE — Telephone Encounter (Signed)
Had attempted to call the patient's daughter, [name] (ok per DPR) on 01/31/19- no answer & her voice mail box was full.  Attempted to call [name] again today and I was able to speak with her. I have advised her of the patient's lab results and Dr. Caryl Comes recommendations to:  1) Stop HCTZ 2) recheck a TSH (and I will also add on a BMP) in 8 weeks  [name] voices understanding of the above and is agreeable. She is aware I will put the lab in for the [facility] [facility] so they may go at their convenience in 8 weeks to have these done.

## 2019-02-07 NOTE — Telephone Encounter (Signed)
Notes recorded by Deboraha Sprang, MD on 01/25/2019 at 5:19 PM EST  Please Inform Patient   Labs are normal x Modest increase in renal function and TSH   And recheck TSH in 8 weeks    Lets have her stop hctz    Thanks

## 2019-02-22 LAB — CUP PACEART INCLINIC DEVICE CHECK
Battery Remaining Longevity: 41 mo
Battery Voltage: 2.94 V
Brady Statistic AP VP Percent: 96.38 %
Brady Statistic AP VS Percent: 0 %
Brady Statistic AS VP Percent: 3.61 %
Brady Statistic AS VS Percent: 0.01 %
Brady Statistic RA Percent Paced: 95.6 %
Brady Statistic RV Percent Paced: 99.98 %
Date Time Interrogation Session: 20201124114700
Implantable Lead Implant Date: 20130529
Implantable Lead Implant Date: 20130529
Implantable Lead Implant Date: 20191009
Implantable Lead Location: 753859
Implantable Lead Location: 753860
Implantable Lead Location: 753860
Implantable Lead Model: 3830
Implantable Lead Model: 4074
Implantable Lead Model: 4574
Implantable Pulse Generator Implant Date: 20191009
Lead Channel Impedance Value: 285 Ohm
Lead Channel Impedance Value: 323 Ohm
Lead Channel Impedance Value: 323 Ohm
Lead Channel Impedance Value: 342 Ohm
Lead Channel Impedance Value: 380 Ohm
Lead Channel Impedance Value: 380 Ohm
Lead Channel Impedance Value: 399 Ohm
Lead Channel Impedance Value: 399 Ohm
Lead Channel Impedance Value: 456 Ohm
Lead Channel Pacing Threshold Amplitude: 0.75 V
Lead Channel Pacing Threshold Amplitude: 1.5 V
Lead Channel Pacing Threshold Amplitude: 2 V
Lead Channel Pacing Threshold Pulse Width: 0.4 ms
Lead Channel Pacing Threshold Pulse Width: 1 ms
Lead Channel Pacing Threshold Pulse Width: 1.5 ms
Lead Channel Sensing Intrinsic Amplitude: 1.5 mV
Lead Channel Setting Pacing Amplitude: 1.5 V
Lead Channel Setting Pacing Amplitude: 2.5 V
Lead Channel Setting Pacing Amplitude: 2.5 V
Lead Channel Setting Pacing Pulse Width: 1.3 ms
Lead Channel Setting Pacing Pulse Width: 1.5 ms
Lead Channel Setting Sensing Sensitivity: 4 mV

## 2019-02-22 NOTE — Progress Notes (Signed)
PPM remote 

## 2019-02-26 ENCOUNTER — Other Ambulatory Visit: Payer: Self-pay | Admitting: Internal Medicine

## 2019-03-04 ENCOUNTER — Other Ambulatory Visit: Payer: Self-pay | Admitting: Internal Medicine

## 2019-03-06 ENCOUNTER — Other Ambulatory Visit: Payer: Self-pay | Admitting: Internal Medicine

## 2019-03-06 NOTE — Telephone Encounter (Signed)
Eliquis 2.5mg  refill request received, pt is 84yrs old, weight-70.3kg, Crea-2.20 on 01/24/2019, Diagnosis-Afib, and last seen by Dr. Caryl Comes on 01/24/2019. Dose is appropriate based on dosing criteria. Will send in refill to requested pharmacy.

## 2019-03-06 NOTE — Telephone Encounter (Signed)
Pt last saw Dr Caryl Comes 01/24/19, last labs 01/24/19 Creat 2.20, age 84, weight 70.3kg, based on specified criteria pt is on appropriate dosage of Eliquis 2.5mg  BID.  Will refill rx.

## 2019-03-07 NOTE — Telephone Encounter (Signed)
This medication was d/c and it looks like Cardiology denied saying it was filled by PCP back in Nov... but pt has pacemaker and is seeing Cardiology... please advise

## 2019-03-08 ENCOUNTER — Encounter: Payer: Self-pay | Admitting: Internal Medicine

## 2019-03-08 NOTE — Telephone Encounter (Signed)
It looks like per cardiology, she is only taking Furosemide

## 2019-03-10 NOTE — Telephone Encounter (Signed)
This is a [place] pt 

## 2019-03-17 ENCOUNTER — Telehealth: Payer: Self-pay | Admitting: *Deleted

## 2019-03-17 NOTE — Telephone Encounter (Signed)
If symptoms assoc with this, would think aabout cardioversion  Await conversation with her daughter   Thanks SK

## 2019-03-17 NOTE — Telephone Encounter (Signed)
Carelink alert received for AT/AF burden >24hrs x7 days, appears patient has been in persistent AF/A-flutter since 03/10/19. On amiodarone and Eliquis per med list. OptiVol trending up since 01/28/19. HBP (LV port) threshold elevated at 4.250V @ 1.12ms per autocapture result. Suspect elevated threshold is inaccurate, autocapture is on monitor only.  Attempted to reach patient's daughter, [name], to discuss symptoms with AF and possible fluid retention. No answer, unable to LM as VM is full.  Routed to Dr. Caryl Comes and Nira Conn, [place]. Will continue to try to reach [place].

## 2019-03-18 ENCOUNTER — Other Ambulatory Visit: Payer: Self-pay | Admitting: Internal Medicine

## 2019-03-20 MED ORDER — LORAZEPAM 0.5 MG PO TABS: 0.5000 mg | ORAL_TABLET | Freq: Every day | ORAL | 0 refills | Status: DC | PRN

## 2019-03-20 MED ORDER — HYDROCODONE-ACETAMINOPHEN 7.5-325 MG PO TABS: 1.0000 | ORAL_TABLET | Freq: Two times a day (BID) | ORAL | 0 refills | Status: DC | PRN

## 2019-03-20 NOTE — Telephone Encounter (Signed)
Last filled 01/15/2019.Marland KitchenMarland KitchenMarland Kitchen please advise

## 2019-03-20 NOTE — Telephone Encounter (Signed)
Second attempt:  Wanda Brooks, no answer, no option to LM.

## 2019-03-21 NOTE — Telephone Encounter (Signed)
Third attempt, no answer, VM is full/ not accepting messages

## 2019-03-22 NOTE — Telephone Encounter (Signed)
Fourth attempt, no answer, VM is full/not accepting messages.

## 2019-03-23 NOTE — Telephone Encounter (Signed)
Attempted to reach daughter, no answer/ VM full not accepting messages. Attempted to contact other emergency contact on record, Ysidro Evert to request he have pt daughter call us- no answer/ no VM.

## 2019-03-28 ENCOUNTER — Other Ambulatory Visit: Payer: Self-pay | Admitting: Internal Medicine

## 2019-03-29 NOTE — Telephone Encounter (Signed)
After multiple unsuccessful attempts to reach pt by phone, Mychart message sent to pt requesting she or her daughter call the office.

## 2019-04-10 ENCOUNTER — Telehealth: Payer: Self-pay

## 2019-04-10 ENCOUNTER — Telehealth: Payer: Medicare Other | Admitting: Family

## 2019-04-10 DIAGNOSIS — W19XXXA Unspecified fall, initial encounter: Secondary | ICD-10-CM

## 2019-04-10 DIAGNOSIS — M545 Low back pain, unspecified: Secondary | ICD-10-CM

## 2019-04-10 NOTE — Telephone Encounter (Signed)
Noted  

## 2019-04-10 NOTE — Telephone Encounter (Signed)
The pt daughter returned Amy phone call. I got a recent transmission from the pt and let her talk with device nurse Jenny Reichmann, [place].

## 2019-04-10 NOTE — Telephone Encounter (Signed)
Daughter called to respond to call made by staff concerning alerts for ongoing AF that started 04/01/19. Remote transmission sent and verified that patient currently not in AF. Patient was in AF that started on  04/01/19 for 90 hours. Daughter reports that patient had intermittent chest pressure and SOB during the time that she was in AF but she is having no symptoms at this time. Education done with family concerning contacting office when patient has a change in condition and ED precautions given for worsening cardiac symptoms and conditions.

## 2019-04-10 NOTE — Progress Notes (Signed)
Based on what you shared with me, I feel your condition warrants further evaluation and I recommend that you be seen for a face to face office visit.  Given you have had a fall and still have ongoing pain, you need to be seen face to face for an x-ray.    NOTE: If you entered your credit card information for this eVisit, you will not be charged. You may see a "hold" on your card for the $35 but that hold will drop off and you will not have a charge processed.   If you are having a true medical emergency please call 911.      For an urgent face to face visit, [place] has five urgent care centers for your convenience:      NEW:  [facility] [place] [facility] [place] Get Driving Directions [id number] [place], [place] [address] . 10 am - 6pm Monday - Friday    [place] [facility] [place] [facility]) Get Driving Directions [id number] [address] [place], [place] [address] . 10 am to 8 pm Monday-Friday . 12 pm to 8 pm [facility]  Get Driving Directions [id number] [address], [place], [place] [address] . 8 am to 8 pm Monday-Friday . 9 am to 6 pm Saturday . 11 am to 6 pm Sunday     [facility] [place] Get Driving Directions  [id number] [address] [place], [place] [address] . 8 am to 8 pm Monday-Friday . 8 am to 4 pm [facility] [place] Get Driving Directions [id number] [place], [place] [address] . 12 pm to 6 pm Monday-Friday      Your e-visit answers were reviewed by a board certified advanced clinical practitioner to complete your personal care plan.  Thank you for using e-Visits.

## 2019-04-11 ENCOUNTER — Other Ambulatory Visit: Payer: Self-pay

## 2019-04-11 ENCOUNTER — Ambulatory Visit (INDEPENDENT_AMBULATORY_CARE_PROVIDER_SITE_OTHER): Payer: Medicare Other | Admitting: Family Medicine

## 2019-04-11 ENCOUNTER — Ambulatory Visit (INDEPENDENT_AMBULATORY_CARE_PROVIDER_SITE_OTHER)
Admission: RE | Admit: 2019-04-11 | Discharge: 2019-04-11 | Disposition: A | Discharge status: Home / Self Care | Payer: Medicare Other | Source: Ambulatory Visit | Attending: Family Medicine | Admitting: Family Medicine

## 2019-04-11 ENCOUNTER — Encounter: Payer: Self-pay | Admitting: Family Medicine

## 2019-04-11 VITALS — BP 150/70 | HR 84 | Temp 96.8°F | Ht 65.0 in | Wt 148.0 lb

## 2019-04-11 DIAGNOSIS — W19XXXA Unspecified fall, initial encounter: Secondary | ICD-10-CM

## 2019-04-11 DIAGNOSIS — R0602 Shortness of breath: Secondary | ICD-10-CM

## 2019-04-11 DIAGNOSIS — S3992XA Unspecified injury of lower back, initial encounter: Secondary | ICD-10-CM | POA: Diagnosis not present

## 2019-04-11 DIAGNOSIS — R102 Pelvic and perineal pain: Secondary | ICD-10-CM | POA: Diagnosis not present

## 2019-04-11 DIAGNOSIS — S51819A Laceration without foreign body of unspecified forearm, initial encounter: Secondary | ICD-10-CM | POA: Diagnosis not present

## 2019-04-11 DIAGNOSIS — Y92009 Unspecified place in unspecified non-institutional (private) residence as the place of occurrence of the external cause: Secondary | ICD-10-CM | POA: Diagnosis not present

## 2019-04-11 DIAGNOSIS — M545 Low back pain, unspecified: Secondary | ICD-10-CM

## 2019-04-11 DIAGNOSIS — R0600 Dyspnea, unspecified: Secondary | ICD-10-CM | POA: Insufficient documentation

## 2019-04-11 DIAGNOSIS — S3993XA Unspecified injury of pelvis, initial encounter: Secondary | ICD-10-CM | POA: Diagnosis not present

## 2019-04-11 NOTE — Assessment & Plan Note (Signed)
After fall on R side  Some tenderness of upper LS  Suspect compression fx (pt is a smoker with OP) Xray of LS now  Also cxr (some chest wall discomfort and sob)

## 2019-04-11 NOTE — Patient Instructions (Addendum)
Continue pain medication as needed   xrays today for pelvis and low spine and chest  We will contact you with results   Keep the arm wound dressed as you have been  Keep clean with soap and water   Use heat as needed for sore muscles (don't sleep on a heating pad)   Alert [place] if more short of breath or any new symptoms

## 2019-04-11 NOTE — Assessment & Plan Note (Signed)
Xray for R pelvic discomfort after a fall  Has had R hip surgery in the past Able to ambulate with walker and no deformity noted
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
## 2019-04-11 NOTE — Assessment & Plan Note (Signed)
Pt had a fall getting on scale away from walker  Skin tears sustained from a towel bar  Pt fell on R side  Soreness remains -worst in upper LS and R pelvic area  Disc fall prevention in the setting of poor balance and chronic illness

## 2019-04-11 NOTE — Assessment & Plan Note (Signed)
Several mild skin tears from fall 2 wk ago  One on anterior forearm still healing- no s/s of infection  Dressed with abx oint and non stick bandage (avoiding adhesive on skin)  Recommend soap and water cleanse and continued dressing until healed and skin flap comes off  inst to call if worse or any concerns

## 2019-04-11 NOTE — Assessment & Plan Note (Signed)
slt more than baseline for copd  May have splinted after fall on side due to cwp  Pain is improved  cxr today

## 2019-04-12 ENCOUNTER — Encounter: Payer: Self-pay | Admitting: Family Medicine

## 2019-04-12 ENCOUNTER — Telehealth: Payer: Self-pay | Admitting: *Deleted

## 2019-04-12 MED ORDER — CALCITONIN (SALMON) 200 UNIT/ACT NA SOLN: 1.0000 | Freq: Every day | NASAL | 0 refills | Status: DC

## 2019-04-12 NOTE — Telephone Encounter (Signed)
Aware- pt was informed via mychart and medication sent in

## 2019-04-12 NOTE — Telephone Encounter (Signed)
Diane with [facility] called to let Dr. Glori Bickers know that the lumbar spine results are in Epic for her review.

## 2019-04-17 ENCOUNTER — Other Ambulatory Visit: Payer: Self-pay | Admitting: Internal Medicine

## 2019-04-17 ENCOUNTER — Encounter: Payer: Self-pay | Admitting: Internal Medicine

## 2019-04-17 ENCOUNTER — Ambulatory Visit: Payer: Medicare Other | Attending: Internal Medicine

## 2019-04-17 ENCOUNTER — Other Ambulatory Visit: Payer: Self-pay

## 2019-04-17 DIAGNOSIS — Z23 Encounter for immunization: Secondary | ICD-10-CM | POA: Insufficient documentation

## 2019-04-17 MED ORDER — HYDROCODONE-ACETAMINOPHEN 7.5-325 MG PO TABS: 1.0000 | ORAL_TABLET | Freq: Two times a day (BID) | ORAL | 0 refills | Status: DC | PRN

## 2019-04-17 NOTE — Progress Notes (Signed)
   Covid-19 Vaccination Clinic  Name:  Wanda Brooks    MRN: [id number] DOB: 06/28/1935  04/17/2019  Wanda Brooks was observed post Covid-19 immunization for 15 minutes without incidence. She was provided with Vaccine Information Sheet and instruction to access the V-Safe system.   Wanda Brooks was instructed to call 911 with any severe reactions post vaccine: Marland Kitchen Difficulty breathing  . Swelling of your face and throat  . A fast heartbeat  . A bad rash all over your body  . Dizziness and weakness    Immunizations Administered    Name Date Dose VIS Date Route   Pfizer COVID-19 Vaccine 04/17/2019 10:09 AM 0.3 mL 02/10/2019 Intramuscular   Manufacturer: [place]   Lot: [id number]   [place]: [id number]

## 2019-04-17 NOTE — Telephone Encounter (Signed)
Norco and Ativan last filled 03/20/2019... due 04/19/2019... please advise

## 2019-04-27 ENCOUNTER — Other Ambulatory Visit: Payer: Self-pay | Admitting: Internal Medicine

## 2019-05-01 ENCOUNTER — Ambulatory Visit (INDEPENDENT_AMBULATORY_CARE_PROVIDER_SITE_OTHER): Payer: Medicare Other | Admitting: *Deleted

## 2019-05-01 DIAGNOSIS — I442 Atrioventricular block, complete: Secondary | ICD-10-CM | POA: Diagnosis not present

## 2019-05-01 LAB — CUP PACEART REMOTE DEVICE CHECK
Battery Remaining Longevity: 34 mo
Battery Voltage: 2.93 V
Brady Statistic AP VP Percent: 99.33 %
Brady Statistic AP VS Percent: 0.01 %
Brady Statistic AS VP Percent: 0.64 %
Brady Statistic AS VS Percent: 0.02 %
Brady Statistic RA Percent Paced: 99.34 %
Brady Statistic RV Percent Paced: 99.98 %
Date Time Interrogation Session: 20210301053407
Implantable Lead Implant Date: 20130529
Implantable Lead Implant Date: 20130529
Implantable Lead Implant Date: 20191009
Implantable Lead Location: 753859
Implantable Lead Location: 753860
Implantable Lead Location: 753860
Implantable Lead Model: 3830
Implantable Lead Model: 4074
Implantable Lead Model: 4574
Implantable Pulse Generator Implant Date: 20191009
Lead Channel Impedance Value: 266 Ohm
Lead Channel Impedance Value: 304 Ohm
Lead Channel Impedance Value: 342 Ohm
Lead Channel Impedance Value: 361 Ohm
Lead Channel Impedance Value: 361 Ohm
Lead Channel Impedance Value: 380 Ohm
Lead Channel Impedance Value: 399 Ohm
Lead Channel Impedance Value: 399 Ohm
Lead Channel Impedance Value: 456 Ohm
Lead Channel Pacing Threshold Amplitude: 3.75 V
Lead Channel Pacing Threshold Pulse Width: 1.5 ms
Lead Channel Sensing Intrinsic Amplitude: 1 mV
Lead Channel Sensing Intrinsic Amplitude: 1 mV
Lead Channel Setting Pacing Amplitude: 1.5 V
Lead Channel Setting Pacing Amplitude: 2.5 V
Lead Channel Setting Pacing Amplitude: 2.5 V
Lead Channel Setting Pacing Pulse Width: 1.3 ms
Lead Channel Setting Pacing Pulse Width: 1.5 ms
Lead Channel Setting Sensing Sensitivity: 4 mV

## 2019-05-01 NOTE — Progress Notes (Signed)
PPM Remote  

## 2019-05-10 ENCOUNTER — Other Ambulatory Visit: Payer: Self-pay

## 2019-05-10 ENCOUNTER — Ambulatory Visit (INDEPENDENT_AMBULATORY_CARE_PROVIDER_SITE_OTHER): Payer: Medicare Other | Admitting: Internal Medicine

## 2019-05-10 ENCOUNTER — Encounter: Payer: Self-pay | Admitting: Internal Medicine

## 2019-05-10 VITALS — BP 134/72 | HR 85 | Temp 98.3°F | Wt 149.0 lb

## 2019-05-10 DIAGNOSIS — M8000XD Age-related osteoporosis with current pathological fracture, unspecified site, subsequent encounter for fracture with routine healing: Secondary | ICD-10-CM

## 2019-05-10 DIAGNOSIS — E559 Vitamin D deficiency, unspecified: Secondary | ICD-10-CM | POA: Diagnosis not present

## 2019-05-10 DIAGNOSIS — S22080D Wedge compression fracture of T11-T12 vertebra, subsequent encounter for fracture with routine healing: Secondary | ICD-10-CM | POA: Diagnosis not present

## 2019-05-10 DIAGNOSIS — W19XXXD Unspecified fall, subsequent encounter: Secondary | ICD-10-CM

## 2019-05-10 DIAGNOSIS — S32010D Wedge compression fracture of first lumbar vertebra, subsequent encounter for fracture with routine healing: Secondary | ICD-10-CM

## 2019-05-10 LAB — VITAMIN D 25 HYDROXY (VIT D DEFICIENCY, FRACTURES): VITD: 40.58 ng/mL (ref 30.00–100.00)

## 2019-05-10 MED ORDER — LIDOCAINE 5 % EX PTCH: 1.0000 | MEDICATED_PATCH | CUTANEOUS | 2 refills | Status: DC

## 2019-05-10 NOTE — Patient Instructions (Signed)
Spinal Compression Fracture  A spinal compression fracture is a collapse of the bones that form the spine (vertebrae). With this type of fracture, the vertebrae become pushed (compressed) into a wedge shape. Most compression fractures happen in the middle or lower part of the spine. What are the causes? This condition may be caused by:  Thinning and loss of density in the bones (osteoporosis). This is the most common cause.  A fall.  A car or motorcycle accident.  Cancer.  Trauma, such as a heavy, direct hit to the head or back. What increases the risk? You are more likely to develop this condition if:  You are 62 years or older.  You have osteoporosis.  You have certain types of cancer, including: ? Multiple myeloma. ? Lymphoma. ? Prostate cancer. ? Lung cancer. ? Breast cancer. What are the signs or symptoms? Symptoms of this condition include:  Severe pain.  Pain that gets worse over time.  Pain that is worse when you stand, walk, sit, or bend.  Sudden pain that is so bad that it is hard for you to move.  Bending or humping of the spine.  Gradual loss of height.  Numbness, tingling, or weakness in the back and legs.  Trouble walking. Your symptoms will depend on the cause of the fracture and how quickly it develops. How is this diagnosed? This condition may be diagnosed based on symptoms, medical history, and a physical exam. During the physical exam, your health care provider may tap along the length of your spine to check for tenderness. Tests may be done to confirm the diagnosis. They may include:  A bone mineral density test to check for osteoporosis.  Imaging tests, such as a spine X-ray, CT scan, or MRI. How is this treated? Treatment for this condition depends on the cause and severity of the condition. Some fractures may heal on their own with supportive care. Treatment may include:  Pain medicine.  Rest.  A back brace.  Physical therapy  exercises.  Medicine to strengthen bone.  Calcium and vitamin D supplements. Fractures that cause the back to become misshapen, cause nerve pain or weakness, or do not respond to other treatment may be treated with surgery. This may include:  Vertebroplasty. Bone cement is injected into the collapsed vertebrae to stabilize them.  Balloon kyphoplasty. The collapsed vertebrae are expanded with a balloon and then bone cement is injected into them.  Spinal fusion. The collapsed vertebrae are connected (fused) to normal vertebrae. Follow these instructions at home: Medicines  Take over-the-counter and prescription medicines only as told by your health care provider.  Do not drive or operate heavy machinery while taking prescription pain medicine.  If you are taking prescription pain medicine, take actions to prevent or treat constipation. Your health care provider may recommend that you: ? Drink enough fluid to keep your urine pale yellow. ? Eat foods that are high in fiber, such as fresh fruits and vegetables, whole grains, and beans. ? Limit foods that are high in fat and processed sugars, such as fried or sweet foods. ? Take an over-the-counter or prescription medicine for constipation. If you have a brace:  Wear the brace as told by your health care provider. Remove it only as told by your health care provider.  Loosen the brace if your fingers or toes tingle, become numb, or turn cold and blue.  Keep the brace clean.  If the brace is not waterproof: ? Do not let it get wet. ?  Cover it with a watertight covering when you take a bath or a shower. Managing pain, stiffness, and swelling   If directed, apply ice to the injured area: ? If you have a removable brace, remove it as told by your health care provider. ? Put ice in a plastic bag. ? Place a towel between your skin and the bag. ? Leave the ice on for 30 minutes every two hours at first. Then apply the ice as needed.  Activity  Rest as told by your health care provider. ? Avoid sitting for a long time without moving. Get up to take short walks every 1-2 hours. This is important to improve blood flow and breathing. Ask for help if you feel weak or unsteady.  Return to your normal activities as directed by your health care provider. Ask what activities are safe for you.  Do exercises to improve motion and strength in your back (physical therapy), as recommended by your health care provider.  Exercise regularly as directed by your health care provider. General instructions   Do not drink alcohol. Alcohol can interfere with your treatment.  Do not use any products that contain nicotine or tobacco, such as cigarettes and e-cigarettes. These can delay bone healing. If you need help quitting, ask your health care provider.  Keep all follow-up visits as told by your health care provider. This is important. It can help to prevent permanent injury, disability, and long-lasting (chronic) pain. Contact a health care provider if:  You have a fever.  You develop a cough that makes your pain worse.  Your pain medicine is not helping.  Your pain does not get better over time.  You cannot return to your normal activities as planned or expected. Get help right away if:  Your pain is very bad and it suddenly gets worse.  You are unable to move any body part (paralysis) that is below the level of your injury.  You have numbness, tingling, or weakness in any body part that is below the level of your injury.  You cannot control your bladder or bowels. Summary  A spinal compression fracture is a collapse of the bones that form the spine (vertebrae).  With this type of fracture, the vertebrae become pushed (compressed) into a wedge shape.  Your symptoms and treatment will depend on the cause and severity of the fracture and how quickly it develops.  Some fractures may heal on their own with supportive care.  Fractures that cause the back to become misshapen, cause nerve pain or weakness, or do not respond to other treatment may be treated with surgery. This information is not intended to replace advice given to you by your health care provider. Make sure you discuss any questions you have with your health care provider. Document Revised: 04/14/2018 Document Reviewed: 03/30/2017 Elsevier Patient Education  2020 [employer]  
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
## 2019-05-12 ENCOUNTER — Telehealth: Payer: Self-pay

## 2019-05-12 NOTE — Telephone Encounter (Signed)
PA has been submitted via [email or web address] and awaiting response

## 2019-05-16 ENCOUNTER — Ambulatory Visit: Payer: Medicare Other | Attending: Internal Medicine

## 2019-05-16 DIAGNOSIS — Z23 Encounter for immunization: Secondary | ICD-10-CM

## 2019-05-16 NOTE — Progress Notes (Signed)
   Covid-19 Vaccination Clinic  Name:  Wanda Brooks    MRN: [id number] DOB: 08-17-1935  05/16/2019  Wanda Brooks was observed post Covid-19 immunization for 15 minutes without incident. She was provided with Vaccine Information Sheet and instruction to access the V-Safe system.   Wanda Brooks was instructed to call 911 with any severe reactions post vaccine: Marland Kitchen Difficulty breathing  . Swelling of face and throat  . A fast heartbeat  . A bad rash all over body  . Dizziness and weakness   Immunizations Administered    Name Date Dose VIS Date Route   Pfizer COVID-19 Vaccine 05/16/2019  1:45 PM 0.3 mL 02/10/2019 Intramuscular   Manufacturer: [place]   Lot: [id number]   [place]: [id number]

## 2019-06-26 ENCOUNTER — Other Ambulatory Visit: Payer: Self-pay | Admitting: Internal Medicine

## 2019-06-28 ENCOUNTER — Other Ambulatory Visit: Payer: Self-pay | Admitting: Internal Medicine

## 2019-06-28 MED ORDER — LORAZEPAM 0.5 MG PO TABS: 0.5000 mg | ORAL_TABLET | Freq: Every day | ORAL | 0 refills | Status: DC | PRN

## 2019-06-28 MED ORDER — HYDROCODONE-ACETAMINOPHEN 7.5-325 MG PO TABS: 1.0000 | ORAL_TABLET | Freq: Two times a day (BID) | ORAL | 0 refills | Status: DC | PRN

## 2019-06-28 NOTE — Telephone Encounter (Signed)
Last filled 04/17/2019... please advise

## 2019-07-03 ENCOUNTER — Encounter: Payer: Self-pay | Admitting: Internal Medicine

## 2019-07-03 NOTE — Telephone Encounter (Signed)
Wanda Brooks with access nurse said that pt is having generalized and lt sided weakness and fatigue and pts daughter Wanda Brooks is on the phone and does not want pt to go to ED.  I spoke with Wanda Brooks and pt fell on 07/02/19 because lt knee gave way and pt has bruising (pt is on Eliquis) and skin tear do not see Td on immunization list. Wanda Brooks said Pt says lt knee and ankle hurts. Pt has been sleeping a lot since 07/01/19 which is not pts normal. Pt is drinking and urinating OK. Wanda Echevaria NP advised pt should go to ED for eval and testing with possible CT scan. Wanda Brooks voiced understanding and declined 911 and Wanda Brooks will take pt to [facility] ED. FYI to Wanda Echevaria NP.

## 2019-07-03 NOTE — Telephone Encounter (Signed)
Agree with advice given

## 2019-07-03 NOTE — Telephone Encounter (Signed)
[facility] - Client TELEPHONE ADVICE RECORD [facility] Patient Name: Wanda Brooks Gender: Female DOB: Sep 04, 1935 Age: 84 Y 33 M 13 D Return Phone Number: [phone] (Primary) Address: City/State/Zip: [place] [address] Client [facility] - Client Client Site [place] [facility] - Day Physician [name] - NP Contact Type Call Who Is Calling Patient / Member / Family / Caregiver Call Type Triage / Clinical Caller Name [name] Relationship To Patient Daughter Return Phone Number [phone] (Primary) Chief Complaint WEAKNESS - sudden on one side of face or body Reason for Call Symptomatic / Request for Health Information Initial Comment PT is having weakness on left side and starting to sleep more and more. Translation No Nurse Assessment Nurse: Rock Nephew, RN, Juliann Pulse Date/Time (Eastern Time): 07/03/2019 11:11:56 AM Confirm and document reason for call. If symptomatic, describe symptoms. ---Caller stated her Mother is having fatigue and generalized weakness. She has more weakness at times on her left side due to her overcompensating for pain on her right side ( knee). Her knee seems to be getting weaker, it gives out on her and she has been falling more than usual . Has the patient had close contact with a person known or suspected to have the novel coronavirus illness OR traveled / lives in area with major community spread (including international travel) in the last 14 days from the onset of symptoms? * If Asymptomatic, screen for exposure and travel within the last 14 days. ---No Does the patient have any new or worsening symptoms? ---Yes Will a triage be completed? ---Yes Related visit to physician within the last 2 weeks? ---No Does the PT have any chronic conditions? (i.e. diabetes, asthma, this includes High risk factors for pregnancy, etc.) ---Yes List chronic conditions. ---chronic knee pain, A-Fib,  cardiac pacemaker, COPD, CAD, CHF, HTN Is this a behavioral health or substance abuse call? ---No Guidelines Guideline Title Affirmed Question Affirmed Notes Nurse Date/Time (Eastern Time) Weakness (Generalized) and Fatigue [1] MODERATE weakness (i.e., interferes with work, school, [place], Therapist, sports, Juliann Pulse 07/03/2019 11:13:07 AM PLEASE NOTE: All timestamps contained within this report are represented as [place] Standard Time. CONFIDENTIALTY NOTICE: This fax transmission is intended only for the addressee. It contains information that is legally privileged, confidential or otherwise protected from use or disclosure. If you are not the intended recipient, you are strictly prohibited from reviewing, disclosing, copying using or disseminating any of this information or taking any action in reliance on or regarding this information. If you have received this fax in error, please notify us immediately by telephone so that we can arrange for its return to [place]. Phone: [phone], Toll-Free: [phone], Fax: [phone] Page: 2 of 2 Call Id: [id number] Guidelines Guideline Title Affirmed Question Affirmed Notes Nurse Date/Time Eilene Ghazi Time) normal activities) AND [2] cause unknown (Exceptions: weakness with acute minor illness, or weakness from poor fluid intake) Disp. Time Eilene Ghazi Time) Disposition Final User 07/03/2019 11:04:25 AM Send to Urgent Queue Silvano Rusk 07/03/2019 11:19:11 AM See HCP within 4 Hours (or PCP triage) Yes Rock Nephew, RN, Gara Kroner Disagree/Comply Comply Caller Understands Yes PreDisposition Call Doctor Care Advice Given Per Guideline CALL BACK IF: * You become worse. SEE HCP WITHIN 4 HOURS (OR PCP TRIAGE): * IF OFFICE WILL BE OPEN: You need to be seen within the next 3 or 4 hours. Call your doctor (or NP/PA) now or as soon as the office opens. CARE ADVICE given per Weakness and Fatigue (Adult) guideline. Comments User: Juliann Pulse,  Rock Nephew, RN Date/Time Eilene Ghazi Time):  07/03/2019 11:30:25 AM Correction to nursing assessment, left sided knee and ankle pain ( not right knee) causing increased weakness on her right side due to overuse on affected leg. Referrals Warm transfer to backline

## 2019-07-19 ENCOUNTER — Other Ambulatory Visit (HOSPITAL_COMMUNITY): Payer: Self-pay

## 2019-07-19 MED ORDER — POTASSIUM CHLORIDE CRYS ER 20 MEQ PO TBCR: 20.0000 meq | EXTENDED_RELEASE_TABLET | ORAL | 0 refills | Status: DC

## 2019-07-25 ENCOUNTER — Ambulatory Visit (INDEPENDENT_AMBULATORY_CARE_PROVIDER_SITE_OTHER): Payer: Medicare Other | Admitting: Internal Medicine

## 2019-07-25 ENCOUNTER — Other Ambulatory Visit: Payer: Self-pay

## 2019-07-25 ENCOUNTER — Encounter: Payer: Self-pay | Admitting: Internal Medicine

## 2019-07-25 ENCOUNTER — Ambulatory Visit (INDEPENDENT_AMBULATORY_CARE_PROVIDER_SITE_OTHER)
Admission: RE | Admit: 2019-07-25 | Discharge: 2019-07-25 | Disposition: A | Discharge status: Home / Self Care | Payer: Medicare Other | Source: Ambulatory Visit | Attending: Internal Medicine | Admitting: Internal Medicine

## 2019-07-25 VITALS — BP 128/68 | HR 60 | Temp 97.9°F | Wt 139.0 lb

## 2019-07-25 VITALS — BP 124/62 | HR 62 | Ht 65.0 in

## 2019-07-25 DIAGNOSIS — I5042 Chronic combined systolic (congestive) and diastolic (congestive) heart failure: Secondary | ICD-10-CM

## 2019-07-25 DIAGNOSIS — I25810 Atherosclerosis of coronary artery bypass graft(s) without angina pectoris: Secondary | ICD-10-CM

## 2019-07-25 DIAGNOSIS — K219 Gastro-esophageal reflux disease without esophagitis: Secondary | ICD-10-CM | POA: Diagnosis not present

## 2019-07-25 DIAGNOSIS — I4819 Other persistent atrial fibrillation: Secondary | ICD-10-CM | POA: Diagnosis not present

## 2019-07-25 DIAGNOSIS — E119 Type 2 diabetes mellitus without complications: Secondary | ICD-10-CM | POA: Diagnosis not present

## 2019-07-25 DIAGNOSIS — M25572 Pain in left ankle and joints of left foot: Secondary | ICD-10-CM

## 2019-07-25 DIAGNOSIS — F419 Anxiety disorder, unspecified: Secondary | ICD-10-CM

## 2019-07-25 DIAGNOSIS — M160 Bilateral primary osteoarthritis of hip: Secondary | ICD-10-CM

## 2019-07-25 DIAGNOSIS — I442 Atrioventricular block, complete: Secondary | ICD-10-CM | POA: Diagnosis not present

## 2019-07-25 DIAGNOSIS — I10 Essential (primary) hypertension: Secondary | ICD-10-CM | POA: Diagnosis not present

## 2019-07-25 DIAGNOSIS — E7800 Pure hypercholesterolemia, unspecified: Secondary | ICD-10-CM | POA: Diagnosis not present

## 2019-07-25 DIAGNOSIS — F329 Major depressive disorder, single episode, unspecified: Secondary | ICD-10-CM

## 2019-07-25 DIAGNOSIS — R296 Repeated falls: Secondary | ICD-10-CM

## 2019-07-25 DIAGNOSIS — S99912A Unspecified injury of left ankle, initial encounter: Secondary | ICD-10-CM | POA: Diagnosis not present

## 2019-07-25 DIAGNOSIS — N1831 Chronic kidney disease, stage 3a: Secondary | ICD-10-CM | POA: Diagnosis not present

## 2019-07-25 DIAGNOSIS — N3281 Overactive bladder: Secondary | ICD-10-CM

## 2019-07-25 DIAGNOSIS — I482 Chronic atrial fibrillation, unspecified: Secondary | ICD-10-CM

## 2019-07-25 DIAGNOSIS — Z0000 Encounter for general adult medical examination without abnormal findings: Secondary | ICD-10-CM

## 2019-07-25 DIAGNOSIS — J439 Emphysema, unspecified: Secondary | ICD-10-CM

## 2019-07-25 DIAGNOSIS — Z950 Presence of cardiac pacemaker: Secondary | ICD-10-CM

## 2019-07-25 DIAGNOSIS — M7989 Other specified soft tissue disorders: Secondary | ICD-10-CM | POA: Diagnosis not present

## 2019-07-25 DIAGNOSIS — E559 Vitamin D deficiency, unspecified: Secondary | ICD-10-CM | POA: Diagnosis not present

## 2019-07-25 LAB — COMPREHENSIVE METABOLIC PANEL
ALT: 25 U/L (ref 0–35)
AST: 23 U/L (ref 0–37)
Albumin: 4.4 g/dL (ref 3.5–5.2)
Alkaline Phosphatase: 49 U/L (ref 39–117)
BUN: 35 mg/dL — ABNORMAL HIGH (ref 6–23)
CO2: 28 mEq/L (ref 19–32)
Calcium: 9.7 mg/dL (ref 8.4–10.5)
Chloride: 103 mEq/L (ref 96–112)
Creatinine, Ser: 1.71 mg/dL — ABNORMAL HIGH (ref 0.40–1.20)
GFR: 28.6 mL/min — ABNORMAL LOW (ref >60.00)
Glucose, Bld: 114 mg/dL — ABNORMAL HIGH (ref 70–99)
Potassium: 4 mEq/L (ref 3.5–5.1)
Sodium: 138 mEq/L (ref 135–145)
Total Bilirubin: 0.5 mg/dL (ref 0.2–1.2)
Total Protein: 7.3 g/dL (ref 6.0–8.3)

## 2019-07-25 LAB — CBC
HCT: 39.5 % (ref 36.0–46.0)
Hemoglobin: 13.2 g/dL (ref 12.0–15.0)
MCHC: 33.4 g/dL (ref 30.0–36.0)
MCV: 94.6 fl (ref 78.0–100.0)
Platelets: 265 10*3/uL (ref 150.0–400.0)
RBC: 4.17 Mil/uL (ref 3.87–5.11)
RDW: 14.5 % (ref 11.5–15.5)
WBC: 10.8 10*3/uL — ABNORMAL HIGH (ref 4.0–10.5)

## 2019-07-25 LAB — VITAMIN D 25 HYDROXY (VIT D DEFICIENCY, FRACTURES): VITD: 91.63 ng/mL (ref 30.00–100.00)

## 2019-07-25 LAB — HEMOGLOBIN A1C: Hgb A1c MFr Bld: 6.2 % (ref 4.6–6.5)

## 2019-07-25 LAB — LIPID PANEL
Cholesterol: 123 mg/dL (ref 0–200)
HDL: 37.8 mg/dL — ABNORMAL LOW (ref >39.00)
LDL Cholesterol: 58 mg/dL (ref 0–99)
NonHDL: 84.77
Total CHOL/HDL Ratio: 3
Triglycerides: 134 mg/dL (ref 0.0–149.0)
VLDL: 26.8 mg/dL (ref 0.0–40.0)

## 2019-07-25 NOTE — Patient Instructions (Signed)
Health Maintenance After Age 82 After age 82, you are at a higher risk for certain long-term diseases and infections as well as injuries from falls. Falls are a major cause of broken bones and head injuries in people who are older than age 82. Getting regular preventive care can help to keep you healthy and well. Preventive care includes getting regular testing and making lifestyle changes as recommended by your health care provider. Talk with your health care provider about:  Which screenings and tests you should have. A screening is a test that checks for a disease when you have no symptoms.  A diet and exercise plan that is right for you. What should I know about screenings and tests to prevent falls? Screening and testing are the best ways to find a health problem early. Early diagnosis and treatment give you the best chance of managing medical conditions that are common after age 82. Certain conditions and lifestyle choices may make you more likely to have a fall. Your health care provider may recommend:  Regular vision checks. Poor vision and conditions such as cataracts can make you more likely to have a fall. If you wear glasses, make sure to get your prescription updated if your vision changes.  Medicine review. Work with your health care provider to regularly review all of the medicines you are taking, including over-the-counter medicines. Ask your health care provider about any side effects that may make you more likely to have a fall. Tell your health care provider if any medicines that you take make you feel dizzy or sleepy.  Osteoporosis screening. Osteoporosis is a condition that causes the bones to get weaker. This can make the bones weak and cause them to break more easily.  Blood pressure screening. Blood pressure changes and medicines to control blood pressure can make you feel dizzy.  Strength and balance checks. Your health care provider may recommend certain tests to check your  strength and balance while standing, walking, or changing positions.  Foot health exam. Foot pain and numbness, as well as not wearing proper footwear, can make you more likely to have a fall.  Depression screening. You may be more likely to have a fall if you have a fear of falling, feel emotionally low, or feel unable to do activities that you used to do.  Alcohol use screening. Using too much alcohol can affect your balance and may make you more likely to have a fall. What actions can I take to lower my risk of falls? General instructions  Talk with your health care provider about your risks for falling. Tell your health care provider if: ? You fall. Be sure to tell your health care provider about all falls, even ones that seem minor. ? You feel dizzy, sleepy, or off-balance.  Take over-the-counter and prescription medicines only as told by your health care provider. These include any supplements.  Eat a healthy diet and maintain a healthy weight. A healthy diet includes low-fat dairy products, low-fat (lean) meats, and fiber from whole grains, beans, and lots of fruits and vegetables. Home safety  Remove any tripping hazards, such as rugs, cords, and clutter.  Install safety equipment such as grab bars in bathrooms and safety rails on stairs.  Keep rooms and walkways well-lit. Activity   Follow a regular exercise program to stay fit. This will help you maintain your balance. Ask your health care provider what types of exercise are appropriate for you.  If you need a cane or   walker, use it as recommended by your health care provider.  Wear supportive shoes that have nonskid soles. Lifestyle  Do not drink alcohol if your health care provider tells you not to drink.  If you drink alcohol, limit how much you have: ? 0-1 drink a day for women. ? 0-2 drinks a day for men.  Be aware of how much alcohol is in your drink. In the [place] one drink equals one typical bottle of beer (12  oz), one-half glass of wine (5 oz), or one shot of hard liquor (1 oz).  Do not use any products that contain nicotine or tobacco, such as cigarettes and e-cigarettes. If you need help quitting, ask your health care provider. Summary  Having a healthy lifestyle and getting preventive care can help to protect your health and wellness after age 82.  Screening and testing are the best way to find a health problem early and help you avoid having a fall. Early diagnosis and treatment give you the best chance for managing medical conditions that are more common for people who are older than age 82.  Falls are a major cause of broken bones and head injuries in people who are older than age 82. Take precautions to prevent a fall at home.  Work with your health care provider to learn what changes you can make to improve your health and wellness and to prevent falls. This information is not intended to replace advice given to you by your health care provider. Make sure you discuss any questions you have with your health care provider. Document Revised: 06/09/2018 Document Reviewed: 12/30/2016 Elsevier Patient Education  2020 [employer] Inc.  

## 2019-07-25 NOTE — Assessment & Plan Note (Signed)
Continue Oxybutynin
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
## 2019-07-25 NOTE — Assessment & Plan Note (Signed)
No angina Continue Atorvastatin, Imdur, Metoprolol and Eliquis She will continue to follow with cardiology

## 2019-07-25 NOTE — Assessment & Plan Note (Signed)
A1c today Will need urine microalbumin at next visit Encouraged her to consume a low carb diet Encouraged routine eye exams Encouraged routine foot exam Immunizations UTD

## 2019-07-25 NOTE — Assessment & Plan Note (Signed)
C met today We will monitor She will continue to follow with nephrology

## 2019-07-25 NOTE — Assessment & Plan Note (Signed)
Controlled on Amlodipine, Amiodarone, Metoprolol and Lasix C met today We will monitor 

## 2019-07-25 NOTE — Patient Instructions (Signed)
Medication Instructions:  - Your physician recommends that you continue on your current medications as directed. Please refer to the Current Medication list given to you today.  *If you need a refill on your cardiac medications before your next appointment, please call your pharmacy*   Lab Work: -none ordered  If you have labs (blood work) drawn today and your tests are completely normal, you will receive your results only by: Marland Kitchen MyChart Message (if you have MyChart) OR . A paper copy in the mail If you have any lab test that is abnormal or we need to change your treatment, we will call you to review the results.   Testing/Procedures: - none ordered   Follow-Up: At [facility], you and your health needs are our priority.  As part of our continuing mission to provide you with exceptional heart care, we have created designated Provider Care Teams.  These Care Teams include your primary Cardiologist (physician) and Advanced Practice Providers (APPs -  Physician Assistants and Nurse Practitioners) who all work together to provide you with the care you need, when you need it.  We recommend signing up for the patient portal called "MyChart".  Sign up information is provided on this After Visit Summary.  MyChart is used to connect with patients for Virtual Visits (Telemedicine).  Patients are able to view lab/test results, encounter notes, upcoming appointments, etc.  Non-urgent messages can be sent to your provider as well.   To learn more about what you can do with MyChart, go to [email or web address].    Your next appointment:   6 month(s)  The format for your next appointment:   In Person  Provider:   Virl Axe, MD   Other Instructions n/a
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
## 2019-07-25 NOTE — Assessment & Plan Note (Signed)
Compensated Continue Metoprolol, Amlodipine, Lasix and Potassium C met today She will continue to follow with cardiology

## 2019-07-25 NOTE — Assessment & Plan Note (Signed)
Continue Hydrocodone as needed

## 2019-07-25 NOTE — Assessment & Plan Note (Signed)
C met lipid profile today Continue Atorvastatin Encouraged her to consume a low-fat diet

## 2019-07-25 NOTE — Assessment & Plan Note (Signed)
Encouraged her to take the Esomeprazole daily as prescribed CBC and C met today

## 2019-07-25 NOTE — Assessment & Plan Note (Signed)
Continue Amiodarone, Eliquis and Metoprolol today CBC and C met today She will continue to follow with cardiology

## 2019-07-25 NOTE — Assessment & Plan Note (Signed)
Stable off inhalers Encourage smoking cessation

## 2019-07-25 NOTE — Assessment & Plan Note (Signed)
Continue Wellbutrin and Lorazepam CSA UTD We will deferred drug screen due to age

## 2019-07-26 ENCOUNTER — Other Ambulatory Visit: Payer: Self-pay | Admitting: Internal Medicine

## 2019-07-27 ENCOUNTER — Encounter: Payer: Self-pay | Admitting: Internal Medicine

## 2019-07-27 NOTE — Progress Notes (Signed)
   Subjective:    Patient ID: Wanda Brooks, female    DOB: [birth year], 84 y.o.   MRN: [id number]  HPI I reviewed NP's note, was available for consultation, and agree with documentation and plan.    Review of Systems     Objective:   Physical Exam         Assessment & Plan:

## 2019-08-01 DIAGNOSIS — I5042 Chronic combined systolic (congestive) and diastolic (congestive) heart failure: Secondary | ICD-10-CM | POA: Diagnosis not present

## 2019-08-01 DIAGNOSIS — F419 Anxiety disorder, unspecified: Secondary | ICD-10-CM | POA: Diagnosis not present

## 2019-08-01 DIAGNOSIS — I2510 Atherosclerotic heart disease of native coronary artery without angina pectoris: Secondary | ICD-10-CM | POA: Diagnosis not present

## 2019-08-01 DIAGNOSIS — Z951 Presence of aortocoronary bypass graft: Secondary | ICD-10-CM | POA: Diagnosis not present

## 2019-08-01 DIAGNOSIS — I25810 Atherosclerosis of coronary artery bypass graft(s) without angina pectoris: Secondary | ICD-10-CM | POA: Diagnosis not present

## 2019-08-01 DIAGNOSIS — I130 Hypertensive heart and chronic kidney disease with heart failure and stage 1 through stage 4 chronic kidney disease, or unspecified chronic kidney disease: Secondary | ICD-10-CM | POA: Diagnosis not present

## 2019-08-01 DIAGNOSIS — Z955 Presence of coronary angioplasty implant and graft: Secondary | ICD-10-CM | POA: Diagnosis not present

## 2019-08-01 DIAGNOSIS — M1610 Unilateral primary osteoarthritis, unspecified hip: Secondary | ICD-10-CM | POA: Diagnosis not present

## 2019-08-01 DIAGNOSIS — E1122 Type 2 diabetes mellitus with diabetic chronic kidney disease: Secondary | ICD-10-CM | POA: Diagnosis not present

## 2019-08-01 DIAGNOSIS — J449 Chronic obstructive pulmonary disease, unspecified: Secondary | ICD-10-CM | POA: Diagnosis not present

## 2019-08-01 DIAGNOSIS — Z9181 History of falling: Secondary | ICD-10-CM | POA: Diagnosis not present

## 2019-08-01 DIAGNOSIS — F17210 Nicotine dependence, cigarettes, uncomplicated: Secondary | ICD-10-CM | POA: Diagnosis not present

## 2019-08-01 DIAGNOSIS — Z950 Presence of cardiac pacemaker: Secondary | ICD-10-CM | POA: Diagnosis not present

## 2019-08-01 DIAGNOSIS — K219 Gastro-esophageal reflux disease without esophagitis: Secondary | ICD-10-CM | POA: Diagnosis not present

## 2019-08-01 DIAGNOSIS — H9190 Unspecified hearing loss, unspecified ear: Secondary | ICD-10-CM | POA: Diagnosis not present

## 2019-08-01 DIAGNOSIS — F329 Major depressive disorder, single episode, unspecified: Secondary | ICD-10-CM | POA: Diagnosis not present

## 2019-08-01 DIAGNOSIS — I4819 Other persistent atrial fibrillation: Secondary | ICD-10-CM | POA: Diagnosis not present

## 2019-08-01 DIAGNOSIS — Z8701 Personal history of pneumonia (recurrent): Secondary | ICD-10-CM | POA: Diagnosis not present

## 2019-08-01 DIAGNOSIS — N1831 Chronic kidney disease, stage 3a: Secondary | ICD-10-CM | POA: Diagnosis not present

## 2019-08-01 DIAGNOSIS — Z7901 Long term (current) use of anticoagulants: Secondary | ICD-10-CM | POA: Diagnosis not present

## 2019-08-02 ENCOUNTER — Ambulatory Visit (INDEPENDENT_AMBULATORY_CARE_PROVIDER_SITE_OTHER): Payer: Medicare Other | Admitting: *Deleted

## 2019-08-02 DIAGNOSIS — I482 Chronic atrial fibrillation, unspecified: Secondary | ICD-10-CM | POA: Diagnosis not present

## 2019-08-02 LAB — CUP PACEART REMOTE DEVICE CHECK
Battery Remaining Longevity: 26 mo
Battery Voltage: 2.92 V
Brady Statistic AP VP Percent: 99.92 %
Brady Statistic AP VS Percent: 0.01 %
Brady Statistic AS VP Percent: 0.03 %
Brady Statistic AS VS Percent: 0.04 %
Brady Statistic RA Percent Paced: 99.96 %
Brady Statistic RV Percent Paced: 99.95 %
Date Time Interrogation Session: 20210602062627
Implantable Lead Implant Date: 20130529
Implantable Lead Implant Date: 20130529
Implantable Lead Implant Date: 20191009
Implantable Lead Location: 753859
Implantable Lead Location: 753860
Implantable Lead Location: 753860
Implantable Lead Model: 3830
Implantable Lead Model: 4074
Implantable Lead Model: 4574
Implantable Pulse Generator Implant Date: 20191009
Lead Channel Impedance Value: 266 Ohm
Lead Channel Impedance Value: 304 Ohm
Lead Channel Impedance Value: 323 Ohm
Lead Channel Impedance Value: 342 Ohm
Lead Channel Impedance Value: 361 Ohm
Lead Channel Impedance Value: 361 Ohm
Lead Channel Impedance Value: 380 Ohm
Lead Channel Impedance Value: 380 Ohm
Lead Channel Impedance Value: 437 Ohm
Lead Channel Pacing Threshold Amplitude: 4.25 V
Lead Channel Pacing Threshold Pulse Width: 1.5 ms
Lead Channel Sensing Intrinsic Amplitude: 0.75 mV
Lead Channel Sensing Intrinsic Amplitude: 0.75 mV
Lead Channel Setting Pacing Amplitude: 1.5 V
Lead Channel Setting Pacing Amplitude: 2.5 V
Lead Channel Setting Pacing Amplitude: 2.5 V
Lead Channel Setting Pacing Pulse Width: 1.3 ms
Lead Channel Setting Pacing Pulse Width: 1.5 ms
Lead Channel Setting Sensing Sensitivity: 4 mV

## 2019-08-03 DIAGNOSIS — I5042 Chronic combined systolic (congestive) and diastolic (congestive) heart failure: Secondary | ICD-10-CM | POA: Diagnosis not present

## 2019-08-03 DIAGNOSIS — N1831 Chronic kidney disease, stage 3a: Secondary | ICD-10-CM | POA: Diagnosis not present

## 2019-08-03 DIAGNOSIS — I130 Hypertensive heart and chronic kidney disease with heart failure and stage 1 through stage 4 chronic kidney disease, or unspecified chronic kidney disease: Secondary | ICD-10-CM | POA: Diagnosis not present

## 2019-08-03 DIAGNOSIS — M1610 Unilateral primary osteoarthritis, unspecified hip: Secondary | ICD-10-CM | POA: Diagnosis not present

## 2019-08-03 DIAGNOSIS — J449 Chronic obstructive pulmonary disease, unspecified: Secondary | ICD-10-CM | POA: Diagnosis not present

## 2019-08-03 DIAGNOSIS — E1122 Type 2 diabetes mellitus with diabetic chronic kidney disease: Secondary | ICD-10-CM | POA: Diagnosis not present

## 2019-08-03 NOTE — Progress Notes (Signed)
Remote pacemaker transmission.   

## 2019-08-08 DIAGNOSIS — E1122 Type 2 diabetes mellitus with diabetic chronic kidney disease: Secondary | ICD-10-CM | POA: Diagnosis not present

## 2019-08-08 DIAGNOSIS — I130 Hypertensive heart and chronic kidney disease with heart failure and stage 1 through stage 4 chronic kidney disease, or unspecified chronic kidney disease: Secondary | ICD-10-CM | POA: Diagnosis not present

## 2019-08-08 DIAGNOSIS — N1831 Chronic kidney disease, stage 3a: Secondary | ICD-10-CM | POA: Diagnosis not present

## 2019-08-08 DIAGNOSIS — J449 Chronic obstructive pulmonary disease, unspecified: Secondary | ICD-10-CM | POA: Diagnosis not present

## 2019-08-08 DIAGNOSIS — I5042 Chronic combined systolic (congestive) and diastolic (congestive) heart failure: Secondary | ICD-10-CM | POA: Diagnosis not present

## 2019-08-08 DIAGNOSIS — M1610 Unilateral primary osteoarthritis, unspecified hip: Secondary | ICD-10-CM | POA: Diagnosis not present

## 2019-08-09 DIAGNOSIS — I5042 Chronic combined systolic (congestive) and diastolic (congestive) heart failure: Secondary | ICD-10-CM | POA: Diagnosis not present

## 2019-08-09 DIAGNOSIS — E1122 Type 2 diabetes mellitus with diabetic chronic kidney disease: Secondary | ICD-10-CM | POA: Diagnosis not present

## 2019-08-09 DIAGNOSIS — N1831 Chronic kidney disease, stage 3a: Secondary | ICD-10-CM | POA: Diagnosis not present

## 2019-08-09 DIAGNOSIS — I130 Hypertensive heart and chronic kidney disease with heart failure and stage 1 through stage 4 chronic kidney disease, or unspecified chronic kidney disease: Secondary | ICD-10-CM | POA: Diagnosis not present

## 2019-08-09 DIAGNOSIS — M1610 Unilateral primary osteoarthritis, unspecified hip: Secondary | ICD-10-CM | POA: Diagnosis not present

## 2019-08-09 DIAGNOSIS — J449 Chronic obstructive pulmonary disease, unspecified: Secondary | ICD-10-CM | POA: Diagnosis not present

## 2019-08-12 DIAGNOSIS — I130 Hypertensive heart and chronic kidney disease with heart failure and stage 1 through stage 4 chronic kidney disease, or unspecified chronic kidney disease: Secondary | ICD-10-CM | POA: Diagnosis not present

## 2019-08-12 DIAGNOSIS — M1610 Unilateral primary osteoarthritis, unspecified hip: Secondary | ICD-10-CM | POA: Diagnosis not present

## 2019-08-12 DIAGNOSIS — N1831 Chronic kidney disease, stage 3a: Secondary | ICD-10-CM | POA: Diagnosis not present

## 2019-08-12 DIAGNOSIS — E1122 Type 2 diabetes mellitus with diabetic chronic kidney disease: Secondary | ICD-10-CM | POA: Diagnosis not present

## 2019-08-12 DIAGNOSIS — J449 Chronic obstructive pulmonary disease, unspecified: Secondary | ICD-10-CM | POA: Diagnosis not present

## 2019-08-12 DIAGNOSIS — I5042 Chronic combined systolic (congestive) and diastolic (congestive) heart failure: Secondary | ICD-10-CM | POA: Diagnosis not present

## 2019-08-14 ENCOUNTER — Other Ambulatory Visit: Payer: Self-pay | Admitting: *Deleted

## 2019-08-14 DIAGNOSIS — I5042 Chronic combined systolic (congestive) and diastolic (congestive) heart failure: Secondary | ICD-10-CM | POA: Diagnosis not present

## 2019-08-14 DIAGNOSIS — M1610 Unilateral primary osteoarthritis, unspecified hip: Secondary | ICD-10-CM | POA: Diagnosis not present

## 2019-08-14 DIAGNOSIS — J449 Chronic obstructive pulmonary disease, unspecified: Secondary | ICD-10-CM | POA: Diagnosis not present

## 2019-08-14 DIAGNOSIS — N1831 Chronic kidney disease, stage 3a: Secondary | ICD-10-CM | POA: Diagnosis not present

## 2019-08-14 DIAGNOSIS — E1122 Type 2 diabetes mellitus with diabetic chronic kidney disease: Secondary | ICD-10-CM | POA: Diagnosis not present

## 2019-08-14 DIAGNOSIS — I130 Hypertensive heart and chronic kidney disease with heart failure and stage 1 through stage 4 chronic kidney disease, or unspecified chronic kidney disease: Secondary | ICD-10-CM | POA: Diagnosis not present

## 2019-08-16 ENCOUNTER — Encounter (HOSPITAL_COMMUNITY): Payer: Self-pay | Admitting: Cardiology

## 2019-08-16 ENCOUNTER — Telehealth: Payer: Self-pay

## 2019-08-16 DIAGNOSIS — J449 Chronic obstructive pulmonary disease, unspecified: Secondary | ICD-10-CM | POA: Diagnosis not present

## 2019-08-16 DIAGNOSIS — I130 Hypertensive heart and chronic kidney disease with heart failure and stage 1 through stage 4 chronic kidney disease, or unspecified chronic kidney disease: Secondary | ICD-10-CM | POA: Diagnosis not present

## 2019-08-16 DIAGNOSIS — E1122 Type 2 diabetes mellitus with diabetic chronic kidney disease: Secondary | ICD-10-CM | POA: Diagnosis not present

## 2019-08-16 DIAGNOSIS — N1831 Chronic kidney disease, stage 3a: Secondary | ICD-10-CM | POA: Diagnosis not present

## 2019-08-16 DIAGNOSIS — M1610 Unilateral primary osteoarthritis, unspecified hip: Secondary | ICD-10-CM | POA: Diagnosis not present

## 2019-08-16 DIAGNOSIS — I5042 Chronic combined systolic (congestive) and diastolic (congestive) heart failure: Secondary | ICD-10-CM | POA: Diagnosis not present

## 2019-08-16 MED ORDER — POTASSIUM CHLORIDE CRYS ER 20 MEQ PO TBCR: 20.0000 meq | EXTENDED_RELEASE_TABLET | ORAL | 0 refills | Status: DC

## 2019-08-16 NOTE — Telephone Encounter (Signed)
I scheduled the pt with device clinic for 08-31-2019

## 2019-08-17 DIAGNOSIS — I5042 Chronic combined systolic (congestive) and diastolic (congestive) heart failure: Secondary | ICD-10-CM | POA: Diagnosis not present

## 2019-08-17 DIAGNOSIS — E1122 Type 2 diabetes mellitus with diabetic chronic kidney disease: Secondary | ICD-10-CM | POA: Diagnosis not present

## 2019-08-17 DIAGNOSIS — I130 Hypertensive heart and chronic kidney disease with heart failure and stage 1 through stage 4 chronic kidney disease, or unspecified chronic kidney disease: Secondary | ICD-10-CM | POA: Diagnosis not present

## 2019-08-17 DIAGNOSIS — J449 Chronic obstructive pulmonary disease, unspecified: Secondary | ICD-10-CM | POA: Diagnosis not present

## 2019-08-17 DIAGNOSIS — N1831 Chronic kidney disease, stage 3a: Secondary | ICD-10-CM | POA: Diagnosis not present

## 2019-08-17 DIAGNOSIS — M1610 Unilateral primary osteoarthritis, unspecified hip: Secondary | ICD-10-CM | POA: Diagnosis not present

## 2019-08-18 DIAGNOSIS — I5042 Chronic combined systolic (congestive) and diastolic (congestive) heart failure: Secondary | ICD-10-CM | POA: Diagnosis not present

## 2019-08-18 DIAGNOSIS — E1122 Type 2 diabetes mellitus with diabetic chronic kidney disease: Secondary | ICD-10-CM | POA: Diagnosis not present

## 2019-08-18 DIAGNOSIS — N1831 Chronic kidney disease, stage 3a: Secondary | ICD-10-CM | POA: Diagnosis not present

## 2019-08-18 DIAGNOSIS — M1610 Unilateral primary osteoarthritis, unspecified hip: Secondary | ICD-10-CM | POA: Diagnosis not present

## 2019-08-18 DIAGNOSIS — I130 Hypertensive heart and chronic kidney disease with heart failure and stage 1 through stage 4 chronic kidney disease, or unspecified chronic kidney disease: Secondary | ICD-10-CM | POA: Diagnosis not present

## 2019-08-18 DIAGNOSIS — J449 Chronic obstructive pulmonary disease, unspecified: Secondary | ICD-10-CM | POA: Diagnosis not present

## 2019-08-22 DIAGNOSIS — I130 Hypertensive heart and chronic kidney disease with heart failure and stage 1 through stage 4 chronic kidney disease, or unspecified chronic kidney disease: Secondary | ICD-10-CM | POA: Diagnosis not present

## 2019-08-22 DIAGNOSIS — M1610 Unilateral primary osteoarthritis, unspecified hip: Secondary | ICD-10-CM | POA: Diagnosis not present

## 2019-08-22 DIAGNOSIS — E1122 Type 2 diabetes mellitus with diabetic chronic kidney disease: Secondary | ICD-10-CM | POA: Diagnosis not present

## 2019-08-22 DIAGNOSIS — I5042 Chronic combined systolic (congestive) and diastolic (congestive) heart failure: Secondary | ICD-10-CM | POA: Diagnosis not present

## 2019-08-22 DIAGNOSIS — N1831 Chronic kidney disease, stage 3a: Secondary | ICD-10-CM | POA: Diagnosis not present

## 2019-08-22 DIAGNOSIS — J449 Chronic obstructive pulmonary disease, unspecified: Secondary | ICD-10-CM | POA: Diagnosis not present

## 2019-08-23 ENCOUNTER — Other Ambulatory Visit: Payer: Self-pay | Admitting: Internal Medicine

## 2019-08-23 DIAGNOSIS — I5042 Chronic combined systolic (congestive) and diastolic (congestive) heart failure: Secondary | ICD-10-CM | POA: Diagnosis not present

## 2019-08-23 DIAGNOSIS — N1831 Chronic kidney disease, stage 3a: Secondary | ICD-10-CM | POA: Diagnosis not present

## 2019-08-23 DIAGNOSIS — E1122 Type 2 diabetes mellitus with diabetic chronic kidney disease: Secondary | ICD-10-CM | POA: Diagnosis not present

## 2019-08-23 DIAGNOSIS — J449 Chronic obstructive pulmonary disease, unspecified: Secondary | ICD-10-CM | POA: Diagnosis not present

## 2019-08-23 DIAGNOSIS — I130 Hypertensive heart and chronic kidney disease with heart failure and stage 1 through stage 4 chronic kidney disease, or unspecified chronic kidney disease: Secondary | ICD-10-CM | POA: Diagnosis not present

## 2019-08-23 DIAGNOSIS — M1610 Unilateral primary osteoarthritis, unspecified hip: Secondary | ICD-10-CM | POA: Diagnosis not present

## 2019-08-25 ENCOUNTER — Other Ambulatory Visit: Payer: Self-pay | Admitting: Internal Medicine

## 2019-08-25 NOTE — Telephone Encounter (Signed)
Eliquis 2.5mg  refill request received. Patient is 84 years old, weight-63kg, Crea-1.71 on 07/25/2019, Diagnosis-Afib, and last seen by Dr. Caryl Comes on 07/25/2019. Dose is appropriate based on dosing criteria. Will send in refill to requested pharmacy.

## 2019-08-26 NOTE — Telephone Encounter (Signed)
Last filled 06/28/2019...Marland Kitchen please advise

## 2019-08-26 NOTE — Telephone Encounter (Signed)
Ok to refill, can you phone in Ativan, I am currently unable to e-prescribe

## 2019-08-29 DIAGNOSIS — M1610 Unilateral primary osteoarthritis, unspecified hip: Secondary | ICD-10-CM | POA: Diagnosis not present

## 2019-08-29 DIAGNOSIS — I130 Hypertensive heart and chronic kidney disease with heart failure and stage 1 through stage 4 chronic kidney disease, or unspecified chronic kidney disease: Secondary | ICD-10-CM | POA: Diagnosis not present

## 2019-08-29 DIAGNOSIS — E1122 Type 2 diabetes mellitus with diabetic chronic kidney disease: Secondary | ICD-10-CM | POA: Diagnosis not present

## 2019-08-29 DIAGNOSIS — J449 Chronic obstructive pulmonary disease, unspecified: Secondary | ICD-10-CM | POA: Diagnosis not present

## 2019-08-29 DIAGNOSIS — N1831 Chronic kidney disease, stage 3a: Secondary | ICD-10-CM | POA: Diagnosis not present

## 2019-08-29 DIAGNOSIS — I5042 Chronic combined systolic (congestive) and diastolic (congestive) heart failure: Secondary | ICD-10-CM | POA: Diagnosis not present

## 2019-08-30 ENCOUNTER — Telehealth: Payer: Self-pay | Admitting: *Deleted

## 2019-08-30 DIAGNOSIS — M160 Bilateral primary osteoarthritis of hip: Secondary | ICD-10-CM

## 2019-08-30 NOTE — Telephone Encounter (Signed)
RX printed and signed and placed in MYD box 

## 2019-08-30 NOTE — Addendum Note (Signed)
Addended by: Jearld Fenton on: 08/30/2019 01:41 PM   Modules accepted: Orders

## 2019-08-30 NOTE — Telephone Encounter (Signed)
Wanda Brooks with [facility] left a voicemail stating that she called several weeks ago and requested an order for a Rolator for patient.  No record of a call. Wanda Brooks is requesting that an order be faxed to them at [phone] attention Carla. Wanda Brooks stated to call her if you have any quesitons.

## 2019-08-31 ENCOUNTER — Ambulatory Visit (INDEPENDENT_AMBULATORY_CARE_PROVIDER_SITE_OTHER): Payer: Medicare Other | Admitting: Student

## 2019-08-31 ENCOUNTER — Encounter: Payer: Self-pay | Admitting: Student

## 2019-08-31 ENCOUNTER — Other Ambulatory Visit: Payer: Self-pay

## 2019-08-31 VITALS — BP 120/66 | HR 70 | Ht 65.0 in | Wt 136.8 lb

## 2019-08-31 DIAGNOSIS — F17210 Nicotine dependence, cigarettes, uncomplicated: Secondary | ICD-10-CM | POA: Diagnosis not present

## 2019-08-31 DIAGNOSIS — Z950 Presence of cardiac pacemaker: Secondary | ICD-10-CM

## 2019-08-31 DIAGNOSIS — N1831 Chronic kidney disease, stage 3a: Secondary | ICD-10-CM | POA: Diagnosis not present

## 2019-08-31 DIAGNOSIS — I482 Chronic atrial fibrillation, unspecified: Secondary | ICD-10-CM | POA: Diagnosis not present

## 2019-08-31 DIAGNOSIS — I5042 Chronic combined systolic (congestive) and diastolic (congestive) heart failure: Secondary | ICD-10-CM | POA: Diagnosis not present

## 2019-08-31 DIAGNOSIS — J449 Chronic obstructive pulmonary disease, unspecified: Secondary | ICD-10-CM | POA: Diagnosis not present

## 2019-08-31 DIAGNOSIS — Z951 Presence of aortocoronary bypass graft: Secondary | ICD-10-CM | POA: Diagnosis not present

## 2019-08-31 DIAGNOSIS — Z8701 Personal history of pneumonia (recurrent): Secondary | ICD-10-CM | POA: Diagnosis not present

## 2019-08-31 DIAGNOSIS — I2510 Atherosclerotic heart disease of native coronary artery without angina pectoris: Secondary | ICD-10-CM | POA: Diagnosis not present

## 2019-08-31 DIAGNOSIS — I130 Hypertensive heart and chronic kidney disease with heart failure and stage 1 through stage 4 chronic kidney disease, or unspecified chronic kidney disease: Secondary | ICD-10-CM | POA: Diagnosis not present

## 2019-08-31 DIAGNOSIS — I25810 Atherosclerosis of coronary artery bypass graft(s) without angina pectoris: Secondary | ICD-10-CM | POA: Diagnosis not present

## 2019-08-31 DIAGNOSIS — Z9181 History of falling: Secondary | ICD-10-CM | POA: Diagnosis not present

## 2019-08-31 DIAGNOSIS — F419 Anxiety disorder, unspecified: Secondary | ICD-10-CM | POA: Diagnosis not present

## 2019-08-31 DIAGNOSIS — E1122 Type 2 diabetes mellitus with diabetic chronic kidney disease: Secondary | ICD-10-CM | POA: Diagnosis not present

## 2019-08-31 DIAGNOSIS — Z955 Presence of coronary angioplasty implant and graft: Secondary | ICD-10-CM | POA: Diagnosis not present

## 2019-08-31 DIAGNOSIS — F329 Major depressive disorder, single episode, unspecified: Secondary | ICD-10-CM | POA: Diagnosis not present

## 2019-08-31 DIAGNOSIS — H9190 Unspecified hearing loss, unspecified ear: Secondary | ICD-10-CM | POA: Diagnosis not present

## 2019-08-31 DIAGNOSIS — I442 Atrioventricular block, complete: Secondary | ICD-10-CM | POA: Diagnosis not present

## 2019-08-31 DIAGNOSIS — I4819 Other persistent atrial fibrillation: Secondary | ICD-10-CM | POA: Diagnosis not present

## 2019-08-31 DIAGNOSIS — Z7901 Long term (current) use of anticoagulants: Secondary | ICD-10-CM | POA: Diagnosis not present

## 2019-08-31 DIAGNOSIS — K219 Gastro-esophageal reflux disease without esophagitis: Secondary | ICD-10-CM | POA: Diagnosis not present

## 2019-08-31 DIAGNOSIS — M1610 Unilateral primary osteoarthritis, unspecified hip: Secondary | ICD-10-CM | POA: Diagnosis not present
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
## 2019-08-31 NOTE — Patient Instructions (Signed)
Medication Instructions:  *If you need a refill on your cardiac medications before your next appointment, please call your pharmacy  Lab Work: If you have labs (blood work) drawn today and your tests are completely normal, you will receive your results only by: Marland Kitchen MyChart Message (if you have MyChart) OR . A paper copy in the mail If you have any lab test that is abnormal or we need to change your treatment, we will call you to review the results.  Follow-Up: At [facility], you and your health needs are our priority.  As part of our continuing mission to provide you with exceptional heart care, we have created designated Provider Care Teams.  These Care Teams include your primary Cardiologist (physician) and Advanced Practice Providers (APPs -  Physician Assistants and Nurse Practitioners) who all work together to provide you with the care you need, when you need it.  We recommend signing up for the patient portal called "MyChart".  Sign up information is provided on this After Visit Summary.  MyChart is used to connect with patients for Virtual Visits (Telemedicine).  Patients are able to view lab/test results, encounter notes, upcoming appointments, etc.  Non-urgent messages can be sent to your provider as well.   To learn more about what you can do with MyChart, go to [email or web address].    Your next appointment:   Your physician wants you to follow-up in: 6 MONTHS with Dr. Caryl Comes. You will receive a reminder letter in the mail two months in advance. If you don't receive a letter, please call our office to schedule the follow-up appointment.  Remote monitoring is used to monitor your Pacemaker from home. This monitoring reduces the number of office visits required to check your device to one time per year. It allows [place] to keep an eye on the functioning of your device to ensure it is working properly. You are scheduled for a device check from home on 11/01/19. You may send your  transmission at any time that day. If you have a wireless device, the transmission will be sent automatically. After your physician reviews your transmission, you will receive a postcard with your next transmission date.  The format for your next appointment:   In Person with Virl Axe, MD

## 2019-09-07 DIAGNOSIS — M1610 Unilateral primary osteoarthritis, unspecified hip: Secondary | ICD-10-CM | POA: Diagnosis not present

## 2019-09-07 DIAGNOSIS — N1831 Chronic kidney disease, stage 3a: Secondary | ICD-10-CM | POA: Diagnosis not present

## 2019-09-07 DIAGNOSIS — E1122 Type 2 diabetes mellitus with diabetic chronic kidney disease: Secondary | ICD-10-CM | POA: Diagnosis not present

## 2019-09-07 DIAGNOSIS — J449 Chronic obstructive pulmonary disease, unspecified: Secondary | ICD-10-CM | POA: Diagnosis not present

## 2019-09-07 DIAGNOSIS — I130 Hypertensive heart and chronic kidney disease with heart failure and stage 1 through stage 4 chronic kidney disease, or unspecified chronic kidney disease: Secondary | ICD-10-CM | POA: Diagnosis not present

## 2019-09-07 DIAGNOSIS — I5042 Chronic combined systolic (congestive) and diastolic (congestive) heart failure: Secondary | ICD-10-CM | POA: Diagnosis not present

## 2019-09-08 DIAGNOSIS — N1831 Chronic kidney disease, stage 3a: Secondary | ICD-10-CM | POA: Diagnosis not present

## 2019-09-08 DIAGNOSIS — I5042 Chronic combined systolic (congestive) and diastolic (congestive) heart failure: Secondary | ICD-10-CM | POA: Diagnosis not present

## 2019-09-08 DIAGNOSIS — I130 Hypertensive heart and chronic kidney disease with heart failure and stage 1 through stage 4 chronic kidney disease, or unspecified chronic kidney disease: Secondary | ICD-10-CM | POA: Diagnosis not present

## 2019-09-08 DIAGNOSIS — M1610 Unilateral primary osteoarthritis, unspecified hip: Secondary | ICD-10-CM | POA: Diagnosis not present

## 2019-09-08 DIAGNOSIS — E1122 Type 2 diabetes mellitus with diabetic chronic kidney disease: Secondary | ICD-10-CM | POA: Diagnosis not present

## 2019-09-08 DIAGNOSIS — J449 Chronic obstructive pulmonary disease, unspecified: Secondary | ICD-10-CM | POA: Diagnosis not present

## 2019-09-16 ENCOUNTER — Other Ambulatory Visit: Payer: Self-pay | Admitting: Internal Medicine

## 2019-09-24 ENCOUNTER — Other Ambulatory Visit: Payer: Self-pay | Admitting: Internal Medicine

## 2019-09-26 ENCOUNTER — Other Ambulatory Visit: Payer: Self-pay | Admitting: Internal Medicine

## 2019-09-28 ENCOUNTER — Other Ambulatory Visit: Payer: Self-pay | Admitting: Internal Medicine

## 2019-09-28 MED ORDER — HYDROCODONE-ACETAMINOPHEN 7.5-325 MG PO TABS: 1.0000 | ORAL_TABLET | Freq: Two times a day (BID) | ORAL | 0 refills | Status: AC | PRN

## 2019-09-28 NOTE — Telephone Encounter (Signed)
Last filled 06/28/2019... please advise

## 2019-10-02 ENCOUNTER — Encounter: Payer: Self-pay | Admitting: Internal Medicine

## 2019-10-03 ENCOUNTER — Other Ambulatory Visit: Payer: Self-pay

## 2019-10-03 ENCOUNTER — Ambulatory Visit (INDEPENDENT_AMBULATORY_CARE_PROVIDER_SITE_OTHER): Payer: Medicare Other | Admitting: Family Medicine

## 2019-10-03 ENCOUNTER — Ambulatory Visit (INDEPENDENT_AMBULATORY_CARE_PROVIDER_SITE_OTHER)
Admission: RE | Admit: 2019-10-03 | Discharge: 2019-10-03 | Disposition: A | Discharge status: Home / Self Care | Payer: Medicare Other | Source: Ambulatory Visit | Attending: Family Medicine | Admitting: Family Medicine

## 2019-10-03 ENCOUNTER — Encounter: Payer: Self-pay | Admitting: Family Medicine

## 2019-10-03 VITALS — BP 130/60 | HR 65 | Temp 96.8°F | Ht 65.0 in | Wt 132.2 lb

## 2019-10-03 DIAGNOSIS — S59901A Unspecified injury of right elbow, initial encounter: Secondary | ICD-10-CM | POA: Diagnosis not present

## 2019-10-03 DIAGNOSIS — S59909A Unspecified injury of unspecified elbow, initial encounter: Secondary | ICD-10-CM | POA: Insufficient documentation

## 2019-10-03 DIAGNOSIS — S41111A Laceration without foreign body of right upper arm, initial encounter: Secondary | ICD-10-CM

## 2019-10-03 DIAGNOSIS — Z23 Encounter for immunization: Secondary | ICD-10-CM

## 2019-10-03 DIAGNOSIS — S41119A Laceration without foreign body of unspecified upper arm, initial encounter: Secondary | ICD-10-CM | POA: Insufficient documentation

## 2019-10-03 DIAGNOSIS — T148XXA Other injury of unspecified body region, initial encounter: Secondary | ICD-10-CM | POA: Insufficient documentation

## 2019-10-03 DIAGNOSIS — M7989 Other specified soft tissue disorders: Secondary | ICD-10-CM | POA: Diagnosis not present

## 2019-10-03 DIAGNOSIS — I25810 Atherosclerosis of coronary artery bypass graft(s) without angina pectoris: Secondary | ICD-10-CM | POA: Diagnosis not present

## 2019-10-03 MED ORDER — CEPHALEXIN 500 MG PO CAPS: 500.0000 mg | ORAL_CAPSULE | Freq: Three times a day (TID) | ORAL | 0 refills | Status: AC
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
## 2019-10-03 NOTE — Assessment & Plan Note (Signed)
R elbow is tender and swollen (also with abrasion)  Xray ordered  rom if fair  Discussed use if ice and tylenol as needed  Caused by fall- pt needs to be more closely supervised

## 2019-10-03 NOTE — Assessment & Plan Note (Signed)
From recurrent falls  Multiple skin tears -one on R forearm is healing by 2ndary intention  No evidence of infection in those areas  Tetanus shot updated  Abrasion on R elbow is pink and mildly tender  Per family-this has worsened in the past several days Will tx with keflex inst to clean all wounds with soap and water and cover loosely if needed

## 2019-10-03 NOTE — Patient Instructions (Addendum)
Xray of elbow now and call later with a result   Use ice if helpful  Keep wounds clean with soap and water  Antibiotic ointment helps also  Watch for increased redness/swelling or pain   Tetanus shot today

## 2019-10-03 NOTE — Assessment & Plan Note (Signed)
Abrasion on R elbow has erythema and tenderness with mild swelling Will tx for possible early cellulitis with keflex

## 2019-10-05 ENCOUNTER — Other Ambulatory Visit: Payer: Self-pay | Admitting: Internal Medicine

## 2019-10-09 ENCOUNTER — Inpatient Hospital Stay: Payer: Medicare Other

## 2019-10-09 ENCOUNTER — Encounter: Payer: Self-pay | Admitting: Emergency Medicine

## 2019-10-09 ENCOUNTER — Emergency Department: Payer: Medicare Other

## 2019-10-09 ENCOUNTER — Inpatient Hospital Stay
Admit: 2019-10-09 | Discharge: 2019-10-09 | Disposition: A | Discharge status: Home / Self Care | Payer: Medicare Other | Attending: Internal Medicine | Admitting: Internal Medicine

## 2019-10-09 ENCOUNTER — Other Ambulatory Visit: Payer: Self-pay

## 2019-10-09 ENCOUNTER — Ambulatory Visit: Payer: Medicare Other | Admitting: Internal Medicine

## 2019-10-09 ENCOUNTER — Inpatient Hospital Stay
Admission: EM | Admit: 2019-10-09 | Discharge: 2019-11-01 | DRG: 291 | Disposition: E | Payer: Medicare Other | Attending: Internal Medicine | Admitting: Internal Medicine

## 2019-10-09 DIAGNOSIS — Z96641 Presence of right artificial hip joint: Secondary | ICD-10-CM | POA: Diagnosis present

## 2019-10-09 DIAGNOSIS — I340 Nonrheumatic mitral (valve) insufficiency: Secondary | ICD-10-CM | POA: Diagnosis present

## 2019-10-09 DIAGNOSIS — H9190 Unspecified hearing loss, unspecified ear: Secondary | ICD-10-CM | POA: Diagnosis present

## 2019-10-09 DIAGNOSIS — J9600 Acute respiratory failure, unspecified whether with hypoxia or hypercapnia: Secondary | ICD-10-CM | POA: Diagnosis not present

## 2019-10-09 DIAGNOSIS — Z83438 Family history of other disorder of lipoprotein metabolism and other lipidemia: Secondary | ICD-10-CM

## 2019-10-09 DIAGNOSIS — Z515 Encounter for palliative care: Secondary | ICD-10-CM

## 2019-10-09 DIAGNOSIS — Z951 Presence of aortocoronary bypass graft: Secondary | ICD-10-CM | POA: Diagnosis not present

## 2019-10-09 DIAGNOSIS — I495 Sick sinus syndrome: Secondary | ICD-10-CM | POA: Diagnosis present

## 2019-10-09 DIAGNOSIS — Z8249 Family history of ischemic heart disease and other diseases of the circulatory system: Secondary | ICD-10-CM

## 2019-10-09 DIAGNOSIS — I5043 Acute on chronic combined systolic (congestive) and diastolic (congestive) heart failure: Secondary | ICD-10-CM | POA: Diagnosis not present

## 2019-10-09 DIAGNOSIS — I5021 Acute systolic (congestive) heart failure: Secondary | ICD-10-CM | POA: Diagnosis not present

## 2019-10-09 DIAGNOSIS — J9601 Acute respiratory failure with hypoxia: Secondary | ICD-10-CM

## 2019-10-09 DIAGNOSIS — Z720 Tobacco use: Secondary | ICD-10-CM

## 2019-10-09 DIAGNOSIS — J440 Chronic obstructive pulmonary disease with acute lower respiratory infection: Secondary | ICD-10-CM | POA: Diagnosis present

## 2019-10-09 DIAGNOSIS — F419 Anxiety disorder, unspecified: Secondary | ICD-10-CM | POA: Diagnosis present

## 2019-10-09 DIAGNOSIS — Z20822 Contact with and (suspected) exposure to covid-19: Secondary | ICD-10-CM | POA: Diagnosis present

## 2019-10-09 DIAGNOSIS — Z8673 Personal history of transient ischemic attack (TIA), and cerebral infarction without residual deficits: Secondary | ICD-10-CM

## 2019-10-09 DIAGNOSIS — R0602 Shortness of breath: Secondary | ICD-10-CM | POA: Diagnosis not present

## 2019-10-09 DIAGNOSIS — R634 Abnormal weight loss: Secondary | ICD-10-CM | POA: Diagnosis present

## 2019-10-09 DIAGNOSIS — R7989 Other specified abnormal findings of blood chemistry: Secondary | ICD-10-CM | POA: Diagnosis not present

## 2019-10-09 DIAGNOSIS — R296 Repeated falls: Secondary | ICD-10-CM | POA: Diagnosis present

## 2019-10-09 DIAGNOSIS — J189 Pneumonia, unspecified organism: Secondary | ICD-10-CM | POA: Diagnosis not present

## 2019-10-09 DIAGNOSIS — Z841 Family history of disorders of kidney and ureter: Secondary | ICD-10-CM

## 2019-10-09 DIAGNOSIS — G319 Degenerative disease of nervous system, unspecified: Secondary | ICD-10-CM | POA: Diagnosis not present

## 2019-10-09 DIAGNOSIS — I25810 Atherosclerosis of coronary artery bypass graft(s) without angina pectoris: Secondary | ICD-10-CM | POA: Diagnosis present

## 2019-10-09 DIAGNOSIS — I2510 Atherosclerotic heart disease of native coronary artery without angina pectoris: Secondary | ICD-10-CM | POA: Diagnosis present

## 2019-10-09 DIAGNOSIS — J439 Emphysema, unspecified: Secondary | ICD-10-CM

## 2019-10-09 DIAGNOSIS — D631 Anemia in chronic kidney disease: Secondary | ICD-10-CM | POA: Diagnosis present

## 2019-10-09 DIAGNOSIS — I482 Chronic atrial fibrillation, unspecified: Secondary | ICD-10-CM

## 2019-10-09 DIAGNOSIS — J449 Chronic obstructive pulmonary disease, unspecified: Secondary | ICD-10-CM | POA: Diagnosis present

## 2019-10-09 DIAGNOSIS — R0689 Other abnormalities of breathing: Secondary | ICD-10-CM | POA: Diagnosis not present

## 2019-10-09 DIAGNOSIS — Z79899 Other long term (current) drug therapy: Secondary | ICD-10-CM

## 2019-10-09 DIAGNOSIS — Z7189 Other specified counseling: Secondary | ICD-10-CM | POA: Diagnosis not present

## 2019-10-09 DIAGNOSIS — Z888 Allergy status to other drugs, medicaments and biological substances status: Secondary | ICD-10-CM

## 2019-10-09 DIAGNOSIS — I509 Heart failure, unspecified: Secondary | ICD-10-CM | POA: Diagnosis not present

## 2019-10-09 DIAGNOSIS — Z90711 Acquired absence of uterus with remaining cervical stump: Secondary | ICD-10-CM

## 2019-10-09 DIAGNOSIS — D649 Anemia, unspecified: Secondary | ICD-10-CM | POA: Diagnosis not present

## 2019-10-09 DIAGNOSIS — M1990 Unspecified osteoarthritis, unspecified site: Secondary | ICD-10-CM | POA: Diagnosis present

## 2019-10-09 DIAGNOSIS — R54 Age-related physical debility: Secondary | ICD-10-CM | POA: Diagnosis present

## 2019-10-09 DIAGNOSIS — Z886 Allergy status to analgesic agent status: Secondary | ICD-10-CM

## 2019-10-09 DIAGNOSIS — I272 Pulmonary hypertension, unspecified: Secondary | ICD-10-CM | POA: Diagnosis present

## 2019-10-09 DIAGNOSIS — K219 Gastro-esophageal reflux disease without esophagitis: Secondary | ICD-10-CM | POA: Diagnosis present

## 2019-10-09 DIAGNOSIS — E1122 Type 2 diabetes mellitus with diabetic chronic kidney disease: Secondary | ICD-10-CM | POA: Diagnosis present

## 2019-10-09 DIAGNOSIS — Z9049 Acquired absence of other specified parts of digestive tract: Secondary | ICD-10-CM

## 2019-10-09 DIAGNOSIS — I517 Cardiomegaly: Secondary | ICD-10-CM | POA: Diagnosis not present

## 2019-10-09 DIAGNOSIS — G9389 Other specified disorders of brain: Secondary | ICD-10-CM | POA: Diagnosis not present

## 2019-10-09 DIAGNOSIS — R682 Dry mouth, unspecified: Secondary | ICD-10-CM | POA: Diagnosis present

## 2019-10-09 DIAGNOSIS — J9621 Acute and chronic respiratory failure with hypoxia: Secondary | ICD-10-CM | POA: Diagnosis not present

## 2019-10-09 DIAGNOSIS — R0600 Dyspnea, unspecified: Secondary | ICD-10-CM

## 2019-10-09 DIAGNOSIS — I130 Hypertensive heart and chronic kidney disease with heart failure and stage 1 through stage 4 chronic kidney disease, or unspecified chronic kidney disease: Secondary | ICD-10-CM | POA: Diagnosis present

## 2019-10-09 DIAGNOSIS — Z833 Family history of diabetes mellitus: Secondary | ICD-10-CM

## 2019-10-09 DIAGNOSIS — I10 Essential (primary) hypertension: Secondary | ICD-10-CM | POA: Diagnosis not present

## 2019-10-09 DIAGNOSIS — E1136 Type 2 diabetes mellitus with diabetic cataract: Secondary | ICD-10-CM | POA: Diagnosis present

## 2019-10-09 DIAGNOSIS — E785 Hyperlipidemia, unspecified: Secondary | ICD-10-CM | POA: Diagnosis present

## 2019-10-09 DIAGNOSIS — N184 Chronic kidney disease, stage 4 (severe): Secondary | ICD-10-CM

## 2019-10-09 DIAGNOSIS — Z66 Do not resuscitate: Secondary | ICD-10-CM | POA: Diagnosis present

## 2019-10-09 DIAGNOSIS — Z6821 Body mass index (BMI) 21.0-21.9, adult: Secondary | ICD-10-CM

## 2019-10-09 DIAGNOSIS — I5023 Acute on chronic systolic (congestive) heart failure: Secondary | ICD-10-CM

## 2019-10-09 DIAGNOSIS — F329 Major depressive disorder, single episode, unspecified: Secondary | ICD-10-CM | POA: Diagnosis present

## 2019-10-09 DIAGNOSIS — I255 Ischemic cardiomyopathy: Secondary | ICD-10-CM | POA: Diagnosis present

## 2019-10-09 DIAGNOSIS — I4819 Other persistent atrial fibrillation: Secondary | ICD-10-CM | POA: Diagnosis present

## 2019-10-09 DIAGNOSIS — N189 Chronic kidney disease, unspecified: Secondary | ICD-10-CM

## 2019-10-09 DIAGNOSIS — Z9841 Cataract extraction status, right eye: Secondary | ICD-10-CM

## 2019-10-09 DIAGNOSIS — J441 Chronic obstructive pulmonary disease with (acute) exacerbation: Secondary | ICD-10-CM | POA: Diagnosis present

## 2019-10-09 DIAGNOSIS — N179 Acute kidney failure, unspecified: Secondary | ICD-10-CM | POA: Diagnosis not present

## 2019-10-09 DIAGNOSIS — I110 Hypertensive heart disease with heart failure: Secondary | ICD-10-CM | POA: Diagnosis not present

## 2019-10-09 DIAGNOSIS — J90 Pleural effusion, not elsewhere classified: Secondary | ICD-10-CM | POA: Diagnosis not present

## 2019-10-09 DIAGNOSIS — Z950 Presence of cardiac pacemaker: Secondary | ICD-10-CM | POA: Diagnosis not present

## 2019-10-09 DIAGNOSIS — R0902 Hypoxemia: Secondary | ICD-10-CM | POA: Diagnosis not present

## 2019-10-09 DIAGNOSIS — J80 Acute respiratory distress syndrome: Secondary | ICD-10-CM | POA: Diagnosis present

## 2019-10-09 DIAGNOSIS — J0130 Acute sphenoidal sinusitis, unspecified: Secondary | ICD-10-CM | POA: Diagnosis not present

## 2019-10-09 DIAGNOSIS — Z9842 Cataract extraction status, left eye: Secondary | ICD-10-CM

## 2019-10-09 DIAGNOSIS — J323 Chronic sphenoidal sinusitis: Secondary | ICD-10-CM | POA: Diagnosis not present

## 2019-10-09 DIAGNOSIS — Z800 Family history of malignant neoplasm of digestive organs: Secondary | ICD-10-CM

## 2019-10-09 DIAGNOSIS — F17210 Nicotine dependence, cigarettes, uncomplicated: Secondary | ICD-10-CM | POA: Diagnosis present

## 2019-10-09 DIAGNOSIS — I129 Hypertensive chronic kidney disease with stage 1 through stage 4 chronic kidney disease, or unspecified chronic kidney disease: Secondary | ICD-10-CM | POA: Diagnosis not present

## 2019-10-09 DIAGNOSIS — Z961 Presence of intraocular lens: Secondary | ICD-10-CM | POA: Diagnosis present

## 2019-10-09 DIAGNOSIS — R809 Proteinuria, unspecified: Secondary | ICD-10-CM | POA: Diagnosis not present

## 2019-10-09 DIAGNOSIS — Z7901 Long term (current) use of anticoagulants: Secondary | ICD-10-CM

## 2019-10-09 DIAGNOSIS — Z862 Personal history of diseases of the blood and blood-forming organs and certain disorders involving the immune mechanism: Secondary | ICD-10-CM

## 2019-10-09 DIAGNOSIS — F32A Depression, unspecified: Secondary | ICD-10-CM | POA: Diagnosis present

## 2019-10-09 DIAGNOSIS — N281 Cyst of kidney, acquired: Secondary | ICD-10-CM | POA: Diagnosis not present

## 2019-10-09 DIAGNOSIS — E875 Hyperkalemia: Secondary | ICD-10-CM | POA: Diagnosis not present

## 2019-10-09 DIAGNOSIS — E876 Hypokalemia: Secondary | ICD-10-CM | POA: Diagnosis present

## 2019-10-09 DIAGNOSIS — Z823 Family history of stroke: Secondary | ICD-10-CM

## 2019-10-09 DIAGNOSIS — R069 Unspecified abnormalities of breathing: Secondary | ICD-10-CM | POA: Diagnosis not present

## 2019-10-09 DIAGNOSIS — R778 Other specified abnormalities of plasma proteins: Secondary | ICD-10-CM | POA: Diagnosis present

## 2019-10-09 DIAGNOSIS — I5033 Acute on chronic diastolic (congestive) heart failure: Secondary | ICD-10-CM | POA: Diagnosis not present

## 2019-10-09 LAB — URINALYSIS, ROUTINE W REFLEX MICROSCOPIC
Bacteria, UA: NONE SEEN
Bilirubin Urine: NEGATIVE
Glucose, UA: NEGATIVE mg/dL
Hgb urine dipstick: NEGATIVE
Ketones, ur: NEGATIVE mg/dL
Leukocytes,Ua: NEGATIVE
Nitrite: NEGATIVE
Protein, ur: 30 mg/dL — AB
Specific Gravity, Urine: 1.014 (ref 1.005–1.030)
pH: 5 (ref 5.0–8.0)

## 2019-10-09 LAB — HEPATITIS B SURFACE ANTIGEN: Hepatitis B Surface Ag: NONREACTIVE

## 2019-10-09 LAB — CBC WITH DIFFERENTIAL/PLATELET
Abs Immature Granulocytes: 0.67 10*3/uL — ABNORMAL HIGH (ref 0.00–0.07)
Basophils Absolute: 0.1 10*3/uL (ref 0.0–0.1)
Basophils Relative: 1 %
Eosinophils Absolute: 0 10*3/uL (ref 0.0–0.5)
Eosinophils Relative: 0 %
HCT: 30.4 % — ABNORMAL LOW (ref 36.0–46.0)
Hemoglobin: 9.9 g/dL — ABNORMAL LOW (ref 12.0–15.0)
Immature Granulocytes: 3 %
Lymphocytes Relative: 8 %
Lymphs Abs: 1.6 10*3/uL (ref 0.7–4.0)
MCH: 30.1 pg (ref 26.0–34.0)
MCHC: 32.6 g/dL (ref 30.0–36.0)
MCV: 92.4 fL (ref 80.0–100.0)
Monocytes Absolute: 1.3 10*3/uL — ABNORMAL HIGH (ref 0.1–1.0)
Monocytes Relative: 6 %
Neutro Abs: 17.9 10*3/uL — ABNORMAL HIGH (ref 1.7–7.7)
Neutrophils Relative %: 82 %
Platelets: 550 10*3/uL — ABNORMAL HIGH (ref 150–400)
RBC: 3.29 MIL/uL — ABNORMAL LOW (ref 3.87–5.11)
RDW: 15.3 % (ref 11.5–15.5)
WBC: 21.6 10*3/uL — ABNORMAL HIGH (ref 4.0–10.5)
nRBC: 0.2 % (ref 0.0–0.2)

## 2019-10-09 LAB — BLOOD GAS, VENOUS
Acid-base deficit: 6.7 mmol/L — ABNORMAL HIGH (ref 0.0–2.0)
Bicarbonate: 18.5 mmol/L — ABNORMAL LOW (ref 20.0–28.0)
O2 Saturation: 67.4 %
Patient temperature: 37
pCO2, Ven: 35 mmHg — ABNORMAL LOW (ref 44.0–60.0)
pH, Ven: 7.33 (ref 7.250–7.430)
pO2, Ven: 38 mmHg (ref 32.0–45.0)

## 2019-10-09 LAB — ECHOCARDIOGRAM COMPLETE
AR max vel: 1.76 cm2
AV Area VTI: 2.39 cm2
AV Area mean vel: 1.58 cm2
AV Mean grad: 3 mmHg
AV Peak grad: 4.9 mmHg
Ao pk vel: 1.11 m/s
Area-P 1/2: 3.42 cm2
Height: 64 in
S' Lateral: 3.79 cm
Weight: 1961.21 oz

## 2019-10-09 LAB — SARS CORONAVIRUS 2 BY RT PCR (HOSPITAL ORDER, PERFORMED IN ~~LOC~~ HOSPITAL LAB): SARS Coronavirus 2: NEGATIVE

## 2019-10-09 LAB — IRON AND TIBC
Iron: 21 ug/dL — ABNORMAL LOW (ref 28–170)
Saturation Ratios: 10 % — ABNORMAL LOW (ref 10.4–31.8)
TIBC: 209 ug/dL — ABNORMAL LOW (ref 250–450)
UIBC: 188 ug/dL

## 2019-10-09 LAB — TROPONIN I (HIGH SENSITIVITY)
Troponin I (High Sensitivity): 40 ng/L — ABNORMAL HIGH (ref <18)
Troponin I (High Sensitivity): 56 ng/L — ABNORMAL HIGH (ref <18)
Troponin I (High Sensitivity): 43 ng/L — ABNORMAL HIGH (ref <18)

## 2019-10-09 LAB — GLUCOSE, CAPILLARY: Glucose-Capillary: 133 mg/dL — ABNORMAL HIGH (ref 70–99)

## 2019-10-09 LAB — COMPREHENSIVE METABOLIC PANEL
ALT: 13 U/L (ref 0–44)
AST: 24 U/L (ref 15–41)
Albumin: 3.1 g/dL — ABNORMAL LOW (ref 3.5–5.0)
Alkaline Phosphatase: 58 U/L (ref 38–126)
Anion gap: 12 (ref 5–15)
BUN: 48 mg/dL — ABNORMAL HIGH (ref 8–23)
CO2: 19 mmol/L — ABNORMAL LOW (ref 22–32)
Calcium: 8.8 mg/dL — ABNORMAL LOW (ref 8.9–10.3)
Chloride: 112 mmol/L — ABNORMAL HIGH (ref 98–111)
Creatinine, Ser: 1.91 mg/dL — ABNORMAL HIGH (ref 0.44–1.00)
GFR calc Af Amer: 28 mL/min — ABNORMAL LOW (ref >60)
GFR calc non Af Amer: 24 mL/min — ABNORMAL LOW (ref >60)
Glucose, Bld: 182 mg/dL — ABNORMAL HIGH (ref 70–99)
Potassium: 4.6 mmol/L (ref 3.5–5.1)
Sodium: 143 mmol/L (ref 135–145)
Total Bilirubin: 0.9 mg/dL (ref 0.3–1.2)
Total Protein: 7.5 g/dL (ref 6.5–8.1)

## 2019-10-09 LAB — PROTEIN / CREATININE RATIO, URINE
Creatinine, Urine: 73 mg/dL
Protein Creatinine Ratio: 0.68 mg/mg{Cre} — ABNORMAL HIGH (ref 0.00–0.15)
Total Protein, Urine: 50 mg/dL

## 2019-10-09 LAB — BRAIN NATRIURETIC PEPTIDE: B Natriuretic Peptide: 1852.6 pg/mL — ABNORMAL HIGH (ref 0.0–100.0)

## 2019-10-09 LAB — FERRITIN: Ferritin: 214 ng/mL (ref 11–307)

## 2019-10-09 LAB — FOLATE: Folate: 9.8 ng/mL (ref >5.9)

## 2019-10-09 LAB — PHOSPHORUS: Phosphorus: 4.8 mg/dL — ABNORMAL HIGH (ref 2.5–4.6)

## 2019-10-09 MED ORDER — INSULIN ASPART 100 UNIT/ML ~~LOC~~ SOLN
0.0000 [IU] | Freq: Three times a day (TID) | SUBCUTANEOUS | Status: DC
  Administered 2019-10-10: 1 [IU] via SUBCUTANEOUS
  Administered 2019-10-11: 2 [IU] via SUBCUTANEOUS
  Filled 2019-10-09 (×2): qty 1

## 2019-10-09 MED ORDER — SODIUM CHLORIDE 0.9 % IV SOLN
500.0000 mg | INTRAVENOUS | Status: DC
  Administered 2019-10-10 – 2019-10-11 (×2): 500 mg via INTRAVENOUS
  Filled 2019-10-09 (×2): qty 500

## 2019-10-09 MED ORDER — FUROSEMIDE 10 MG/ML IJ SOLN
40.0000 mg | Freq: Once | INTRAMUSCULAR | Status: AC
  Administered 2019-10-09: 40 mg via INTRAVENOUS
  Filled 2019-10-09: qty 4

## 2019-10-09 MED ORDER — AMLODIPINE BESYLATE 5 MG PO TABS
10.0000 mg | ORAL_TABLET | Freq: Every day | ORAL | Status: DC
  Administered 2019-10-09 – 2019-10-10 (×2): 10 mg via ORAL
  Filled 2019-10-09 (×3): qty 2

## 2019-10-09 MED ORDER — METOPROLOL SUCCINATE ER 50 MG PO TB24
100.0000 mg | ORAL_TABLET | Freq: Every day | ORAL | Status: DC
  Administered 2019-10-09 – 2019-10-10 (×2): 100 mg via ORAL
  Filled 2019-10-09 (×3): qty 2

## 2019-10-09 MED ORDER — ATORVASTATIN CALCIUM 20 MG PO TABS
20.0000 mg | ORAL_TABLET | Freq: Every day | ORAL | Status: DC
  Administered 2019-10-09 – 2019-10-10 (×2): 20 mg via ORAL
  Filled 2019-10-09 (×2): qty 1

## 2019-10-09 MED ORDER — POLYETHYLENE GLYCOL 3350 17 G PO PACK
17.0000 g | PACK | Freq: Every day | ORAL | Status: DC
  Administered 2019-10-09 – 2019-10-10 (×2): 17 g via ORAL
  Filled 2019-10-09 (×3): qty 1

## 2019-10-09 MED ORDER — VITAMIN D 25 MCG (1000 UNIT) PO TABS
1000.0000 [IU] | ORAL_TABLET | Freq: Every day | ORAL | Status: DC
  Administered 2019-10-09 – 2019-10-10 (×2): 1000 [IU] via ORAL
  Filled 2019-10-09 (×3): qty 1

## 2019-10-09 MED ORDER — SODIUM CHLORIDE 0.9 % IV SOLN
2.0000 g | Freq: Once | INTRAVENOUS | Status: AC
  Administered 2019-10-09: 2 g via INTRAVENOUS
  Filled 2019-10-09: qty 20

## 2019-10-09 MED ORDER — POTASSIUM CHLORIDE CRYS ER 20 MEQ PO TBCR
20.0000 meq | EXTENDED_RELEASE_TABLET | ORAL | Status: DC
  Administered 2019-10-09: 20 meq via ORAL
  Filled 2019-10-09 (×3): qty 1

## 2019-10-09 MED ORDER — OXYBUTYNIN CHLORIDE ER 10 MG PO TB24
10.0000 mg | ORAL_TABLET | Freq: Every day | ORAL | Status: DC
  Administered 2019-10-09 – 2019-10-10 (×2): 10 mg via ORAL
  Filled 2019-10-09 (×3): qty 1

## 2019-10-09 MED ORDER — FUROSEMIDE 10 MG/ML IJ SOLN
40.0000 mg | Freq: Two times a day (BID) | INTRAMUSCULAR | Status: DC
  Administered 2019-10-09 – 2019-10-11 (×4): 40 mg via INTRAVENOUS
  Filled 2019-10-09 (×4): qty 4

## 2019-10-09 MED ORDER — SODIUM CHLORIDE 0.9% FLUSH: 3.0000 mL | INTRAVENOUS | Status: DC | PRN

## 2019-10-09 MED ORDER — SODIUM CHLORIDE 0.9 % IV SOLN
500.0000 mg | Freq: Once | INTRAVENOUS | Status: AC
  Administered 2019-10-09: 500 mg via INTRAVENOUS
  Filled 2019-10-09: qty 500

## 2019-10-09 MED ORDER — ACETAMINOPHEN 325 MG PO TABS: 650.0000 mg | ORAL_TABLET | ORAL | Status: DC | PRN

## 2019-10-09 MED ORDER — SODIUM CHLORIDE 0.9% FLUSH
3.0000 mL | Freq: Two times a day (BID) | INTRAVENOUS | Status: DC
  Administered 2019-10-10 (×2): 3 mL via INTRAVENOUS

## 2019-10-09 MED ORDER — ONDANSETRON HCL 4 MG/2ML IJ SOLN: 4.0000 mg | Freq: Four times a day (QID) | INTRAMUSCULAR | Status: DC | PRN

## 2019-10-09 MED ORDER — LORAZEPAM 0.5 MG PO TABS
0.5000 mg | ORAL_TABLET | Freq: Every day | ORAL | Status: DC | PRN
  Administered 2019-10-10: 0.5 mg via ORAL
  Filled 2019-10-09: qty 1

## 2019-10-09 MED ORDER — SODIUM CHLORIDE 0.9 % IV SOLN: 250.0000 mL | INTRAVENOUS | Status: DC | PRN

## 2019-10-09 MED ORDER — DOCUSATE SODIUM 100 MG PO CAPS
100.0000 mg | ORAL_CAPSULE | Freq: Two times a day (BID) | ORAL | Status: DC
  Administered 2019-10-09 – 2019-10-10 (×4): 100 mg via ORAL
  Filled 2019-10-09 (×5): qty 1

## 2019-10-09 MED ORDER — IPRATROPIUM-ALBUTEROL 0.5-2.5 (3) MG/3ML IN SOLN
3.0000 mL | Freq: Four times a day (QID) | RESPIRATORY_TRACT | Status: DC | PRN
  Administered 2019-10-10: 3 mL via RESPIRATORY_TRACT
  Filled 2019-10-09: qty 3

## 2019-10-09 MED ORDER — ACETAMINOPHEN 325 MG PO TABS: 325.0000 mg | ORAL_TABLET | ORAL | Status: DC | PRN

## 2019-10-09 MED ORDER — SODIUM CHLORIDE 0.9 % IV SOLN
1.0000 g | Freq: Once | INTRAVENOUS | Status: AC
  Administered 2019-10-10: 1 g via INTRAVENOUS
  Filled 2019-10-09: qty 10

## 2019-10-09 MED ORDER — AMIODARONE HCL 200 MG PO TABS
200.0000 mg | ORAL_TABLET | Freq: Every day | ORAL | Status: DC
  Administered 2019-10-09 – 2019-10-10 (×2): 200 mg via ORAL
  Filled 2019-10-09 (×3): qty 1

## 2019-10-09 MED ORDER — HYDROCODONE-ACETAMINOPHEN 7.5-325 MG PO TABS: 1.0000 | ORAL_TABLET | Freq: Two times a day (BID) | ORAL | Status: DC | PRN

## 2019-10-09 MED ORDER — FLUTICASONE PROPIONATE 50 MCG/ACT NA SUSP
1.0000 | Freq: Every day | NASAL | Status: DC | PRN
  Filled 2019-10-09: qty 16

## 2019-10-09 MED ORDER — APIXABAN 2.5 MG PO TABS
2.5000 mg | ORAL_TABLET | Freq: Two times a day (BID) | ORAL | Status: DC
  Administered 2019-10-09 – 2019-10-10 (×3): 2.5 mg via ORAL
  Filled 2019-10-09 (×3): qty 1

## 2019-10-09 MED ORDER — PANTOPRAZOLE SODIUM 40 MG PO TBEC
40.0000 mg | DELAYED_RELEASE_TABLET | Freq: Every day | ORAL | Status: DC
  Administered 2019-10-09 – 2019-10-10 (×2): 40 mg via ORAL
  Filled 2019-10-09 (×3): qty 1

## 2019-10-09 MED ORDER — HYDRALAZINE HCL 25 MG PO TABS
25.0000 mg | ORAL_TABLET | Freq: Three times a day (TID) | ORAL | Status: DC
  Administered 2019-10-09 – 2019-10-10 (×5): 25 mg via ORAL
  Filled 2019-10-09 (×9): qty 1

## 2019-10-09 MED ORDER — IPRATROPIUM-ALBUTEROL 0.5-2.5 (3) MG/3ML IN SOLN
3.0000 mL | Freq: Once | RESPIRATORY_TRACT | Status: AC
  Administered 2019-10-09: 3 mL via RESPIRATORY_TRACT
  Filled 2019-10-09: qty 3

## 2019-10-09 MED ORDER — BUPROPION HCL 75 MG PO TABS
75.0000 mg | ORAL_TABLET | Freq: Two times a day (BID) | ORAL | Status: DC
  Administered 2019-10-09 – 2019-10-10 (×4): 75 mg via ORAL
  Filled 2019-10-09 (×6): qty 1

## 2019-10-09 MED ORDER — MAGNESIUM SULFATE 2 GM/50ML IV SOLN
2.0000 g | Freq: Once | INTRAVENOUS | Status: AC
  Administered 2019-10-09: 2 g via INTRAVENOUS
  Filled 2019-10-09: qty 50

## 2019-10-09 MED ORDER — ISOSORBIDE MONONITRATE ER 60 MG PO TB24
120.0000 mg | ORAL_TABLET | Freq: Every day | ORAL | Status: DC
  Administered 2019-10-09 – 2019-10-10 (×2): 120 mg via ORAL
  Filled 2019-10-09 (×3): qty 2

## 2019-10-09 NOTE — Progress Notes (Signed)
*  PRELIMINARY RESULTS* Echocardiogram 2D Echocardiogram has been performed.  Wanda Brooks 10/19/2019, 2:06 PM
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
## 2019-10-09 NOTE — ED Notes (Signed)
2 sets blood cultures drawn by Ena Dawley RN sent to lab with save labels.
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
## 2019-10-09 NOTE — ED Triage Notes (Signed)
Pt arrives via [facility] with c/o respiratory distress. Patient arrived from home. Patient awake. Breathing is labored.
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
## 2019-10-10 ENCOUNTER — Telehealth: Payer: Self-pay | Admitting: Internal Medicine

## 2019-10-10 ENCOUNTER — Ambulatory Visit: Payer: Medicare Other | Admitting: Internal Medicine

## 2019-10-10 DIAGNOSIS — Z7189 Other specified counseling: Secondary | ICD-10-CM

## 2019-10-10 DIAGNOSIS — J9601 Acute respiratory failure with hypoxia: Secondary | ICD-10-CM | POA: Diagnosis not present

## 2019-10-10 DIAGNOSIS — J189 Pneumonia, unspecified organism: Secondary | ICD-10-CM | POA: Diagnosis not present

## 2019-10-10 DIAGNOSIS — N184 Chronic kidney disease, stage 4 (severe): Secondary | ICD-10-CM | POA: Diagnosis not present

## 2019-10-10 DIAGNOSIS — I5043 Acute on chronic combined systolic (congestive) and diastolic (congestive) heart failure: Secondary | ICD-10-CM | POA: Diagnosis not present

## 2019-10-10 DIAGNOSIS — J449 Chronic obstructive pulmonary disease, unspecified: Secondary | ICD-10-CM

## 2019-10-10 DIAGNOSIS — J188 Other pneumonia, unspecified organism: Secondary | ICD-10-CM | POA: Insufficient documentation

## 2019-10-10 DIAGNOSIS — Z515 Encounter for palliative care: Secondary | ICD-10-CM

## 2019-10-10 LAB — CBC WITH DIFFERENTIAL/PLATELET
Abs Immature Granulocytes: 0.25 10*3/uL — ABNORMAL HIGH (ref 0.00–0.07)
Basophils Absolute: 0 10*3/uL (ref 0.0–0.1)
Basophils Relative: 0 %
Eosinophils Absolute: 0 10*3/uL (ref 0.0–0.5)
Eosinophils Relative: 0 %
HCT: 24.9 % — ABNORMAL LOW (ref 36.0–46.0)
Hemoglobin: 8.1 g/dL — ABNORMAL LOW (ref 12.0–15.0)
Immature Granulocytes: 1 %
Lymphocytes Relative: 6 %
Lymphs Abs: 1.1 10*3/uL (ref 0.7–4.0)
MCH: 30.8 pg (ref 26.0–34.0)
MCHC: 32.5 g/dL (ref 30.0–36.0)
MCV: 94.7 fL (ref 80.0–100.0)
Monocytes Absolute: 1.2 10*3/uL — ABNORMAL HIGH (ref 0.1–1.0)
Monocytes Relative: 6 %
Neutro Abs: 17.3 10*3/uL — ABNORMAL HIGH (ref 1.7–7.7)
Neutrophils Relative %: 87 %
Platelets: 452 10*3/uL — ABNORMAL HIGH (ref 150–400)
RBC: 2.63 MIL/uL — ABNORMAL LOW (ref 3.87–5.11)
RDW: 15.2 % (ref 11.5–15.5)
WBC: 19.9 10*3/uL — ABNORMAL HIGH (ref 4.0–10.5)
nRBC: 0.2 % (ref 0.0–0.2)

## 2019-10-10 LAB — BLOOD GAS, VENOUS
Acid-base deficit: 3.9 mmol/L — ABNORMAL HIGH (ref 0.0–2.0)
Bicarbonate: 20.4 mmol/L (ref 20.0–28.0)
O2 Saturation: 85.7 %
Patient temperature: 37
pCO2, Ven: 33 mmHg — ABNORMAL LOW (ref 44.0–60.0)
pH, Ven: 7.4 (ref 7.250–7.430)
pO2, Ven: 51 mmHg — ABNORMAL HIGH (ref 32.0–45.0)

## 2019-10-10 LAB — GLUCOSE, CAPILLARY
Glucose-Capillary: 118 mg/dL — ABNORMAL HIGH (ref 70–99)
Glucose-Capillary: 119 mg/dL — ABNORMAL HIGH (ref 70–99)
Glucose-Capillary: 124 mg/dL — ABNORMAL HIGH (ref 70–99)
Glucose-Capillary: 108 mg/dL — ABNORMAL HIGH (ref 70–99)
Glucose-Capillary: 146 mg/dL — ABNORMAL HIGH (ref 70–99)

## 2019-10-10 LAB — BASIC METABOLIC PANEL
Anion gap: 13 (ref 5–15)
BUN: 57 mg/dL — ABNORMAL HIGH (ref 8–23)
CO2: 19 mmol/L — ABNORMAL LOW (ref 22–32)
Calcium: 8.3 mg/dL — ABNORMAL LOW (ref 8.9–10.3)
Chloride: 111 mmol/L (ref 98–111)
Creatinine, Ser: 1.91 mg/dL — ABNORMAL HIGH (ref 0.44–1.00)
GFR calc Af Amer: 28 mL/min — ABNORMAL LOW (ref >60)
GFR calc non Af Amer: 24 mL/min — ABNORMAL LOW (ref >60)
Glucose, Bld: 120 mg/dL — ABNORMAL HIGH (ref 70–99)
Potassium: 4 mmol/L (ref 3.5–5.1)
Sodium: 143 mmol/L (ref 135–145)

## 2019-10-10 LAB — TROPONIN I (HIGH SENSITIVITY): Troponin I (High Sensitivity): 33 ng/L — ABNORMAL HIGH (ref <18)

## 2019-10-10 LAB — HEPATITIS B SURFACE ANTIBODY,QUALITATIVE: Hep B S Ab: NONREACTIVE

## 2019-10-10 LAB — HEMOGLOBIN A1C
Hgb A1c MFr Bld: 6.1 % — ABNORMAL HIGH (ref 4.8–5.6)
Mean Plasma Glucose: 128.37 mg/dL

## 2019-10-10 LAB — VITAMIN B12: Vitamin B-12: 176 pg/mL — ABNORMAL LOW (ref 180–914)

## 2019-10-10 LAB — HEPATITIS B CORE ANTIBODY, IGM: Hep B C IgM: NONREACTIVE

## 2019-10-10 LAB — HEPATITIS C ANTIBODY: HCV Ab: NONREACTIVE

## 2019-10-10 MED ORDER — BUDESONIDE 0.25 MG/2ML IN SUSP
0.2500 mg | Freq: Two times a day (BID) | RESPIRATORY_TRACT | Status: DC
  Administered 2019-10-10 – 2019-10-11 (×2): 0.25 mg via RESPIRATORY_TRACT
  Filled 2019-10-10 (×2): qty 2

## 2019-10-10 MED ORDER — LORAZEPAM 2 MG/ML IJ SOLN
INTRAMUSCULAR | Status: AC
  Administered 2019-10-10: 0.5 mg via INTRAVENOUS
  Filled 2019-10-10: qty 1

## 2019-10-10 MED ORDER — LORAZEPAM 2 MG/ML IJ SOLN: 0.5000 mg | Freq: Once | INTRAMUSCULAR | Status: AC

## 2019-10-10 MED ORDER — CYANOCOBALAMIN 1000 MCG/ML IJ SOLN
1000.0000 ug | Freq: Every day | INTRAMUSCULAR | Status: DC
  Administered 2019-10-11: 1000 ug via INTRAMUSCULAR
  Filled 2019-10-10: qty 1

## 2019-10-10 MED ORDER — LORAZEPAM 2 MG/ML IJ SOLN
0.5000 mg | Freq: Once | INTRAMUSCULAR | Status: AC
  Administered 2019-10-11: 0.5 mg via INTRAVENOUS
  Filled 2019-10-10: qty 1

## 2019-10-10 MED ORDER — HEPARIN SODIUM (PORCINE) 5000 UNIT/ML IJ SOLN
5000.0000 [IU] | Freq: Three times a day (TID) | INTRAMUSCULAR | Status: DC
  Administered 2019-10-10 – 2019-10-11 (×3): 5000 [IU] via SUBCUTANEOUS
  Filled 2019-10-10 (×3): qty 1

## 2019-10-10 MED ORDER — METHYLPREDNISOLONE SODIUM SUCC 40 MG IJ SOLR
40.0000 mg | Freq: Once | INTRAMUSCULAR | Status: AC
  Administered 2019-10-10: 40 mg via INTRAVENOUS

## 2019-10-10 MED ORDER — FERROUS SULFATE 325 (65 FE) MG PO TABS
325.0000 mg | ORAL_TABLET | Freq: Every day | ORAL | Status: DC
  Filled 2019-10-10: qty 1

## 2019-10-10 NOTE — ED Notes (Signed)
RT [place] and [place] at bedside to start bipap at this time.

## 2019-10-10 NOTE — ED Notes (Addendum)
Pt changed to inpatient bed from ER stretcher. Provided extra pillow. Pt verbalized comfort.   Pt remains satting around 88-90% on 12 L HFNC. Increased to 15 L HFNC.

## 2019-10-10 NOTE — Telephone Encounter (Signed)
Spoke with patient's daughter.

## 2019-10-10 NOTE — Telephone Encounter (Signed)
Ryan,  Do you have a minute to reach out to her daughter today?

## 2019-10-10 NOTE — Progress Notes (Signed)
[facility]  ROUNDING NOTE   Subjective:   Daughter at bedside.   Patient states she is breathing better.   Objective:  Vital signs in last 24 hours:  Temp:  [97.9 F (36.6 C)] 97.9 F (36.6 C) (08/10 0758) Pulse Rate:  [58-83] 83 (08/10 1130) Resp:  [15-28] 21 (08/10 1130) BP: (110-143)/(45-69) 132/54 (08/10 1130) SpO2:  [90 %-97 %] 90 % (08/10 1357)  Weight change:  Filed Weights   10/19/2019 0529  Weight: 55.6 kg    Intake/Output: I/O last 3 completed shifts: In: 40 [IV Piggyback:50] Out: -    Intake/Output this shift:  Total I/O In: 222.1 [IV Piggyback:222.1] Out: 900 [Urine:900]  Physical Exam: General: NAD,   Head: Normocephalic, atraumatic. Moist oral mucosal membranes  Eyes: Anicteric, PERRL  Neck: Supple, trachea midline  Lungs:  Bilateral wheezes, right sided rhonchi  Heart: Regular rate and rhythm  Abdomen:  Soft, nontender,   Extremities:  no peripheral edema.  Neurologic: Nonfocal, moving all four extremities  Skin: No lesions        Basic Metabolic Panel: Recent Labs  Lab 10/02/2019 0535 10/14/2019 1939 10/10/19 0610  NA 143  --  143  K 4.6  --  4.0  CL 112*  --  111  CO2 19*  --  19*  GLUCOSE 182*  --  120*  BUN 48*  --  57*  CREATININE 1.91*  --  1.91*  CALCIUM 8.8*  --  8.3*  PHOS  --  4.8*  --     Liver Function Tests: Recent Labs  Lab 10/06/2019 0535  AST 24  ALT 13  ALKPHOS 58  BILITOT 0.9  PROT 7.5  ALBUMIN 3.1*   No results for input(s): LIPASE, AMYLASE in the last 168 hours. No results for input(s): AMMONIA in the last 168 hours.  CBC: Recent Labs  Lab 10/17/2019 0535 10/10/19 0815  WBC 21.6* 19.9*  NEUTROABS 17.9* 17.3*  HGB 9.9* 8.1*  HCT 30.4* 24.9*  MCV 92.4 94.7  PLT 550* 452*    Cardiac Enzymes: No results for input(s): CKTOTAL, CKMB, CKMBINDEX, TROPONINI in the last 168 hours.  BNP: Invalid input(s): POCBNP  CBG: Recent Labs  Lab 10/30/2019 1740 10/10/19 0614 10/10/19 0756  10/10/19 1232  GLUCAP 133* 118* 119* 124*    Microbiology: Results for orders placed or performed during the hospital encounter of 10/01/2019  Blood culture (routine x 2)     Status: None (Preliminary result)   Collection Time: 10/22/2019  5:34 AM   Specimen: BLOOD  Result Value Ref Range Status   Specimen Description BLOOD BLOOD LEFT WRIST  Final   Special Requests   Final    BOTTLES DRAWN AEROBIC AND ANAEROBIC Blood Culture adequate volume   Culture   Final    NO GROWTH 1 DAY Performed at [facility], [address] [place], [place] [address]    Report Status PENDING  Incomplete  Blood culture (routine x 2)     Status: None (Preliminary result)   Collection Time: 10/02/2019  5:34 AM   Specimen: BLOOD  Result Value Ref Range Status   Specimen Description BLOOD LEFT ANTECUBITAL  Final   Special Requests   Final    BOTTLES DRAWN AEROBIC AND ANAEROBIC Blood Culture adequate volume   Culture   Final    NO GROWTH 1 DAY Performed at [facility], [address], [place], [place] [address]    Report Status PENDING  Incomplete  SARS Coronavirus 2  by RT PCR (hospital order, performed in [facility] hospital lab) Nasopharyngeal Nasopharyngeal Swab     Status: None   Collection Time: 10/17/2019  5:35 AM   Specimen: Nasopharyngeal Swab  Result Value Ref Range Status   SARS Coronavirus 2 NEGATIVE NEGATIVE Final    Comment: (NOTE) SARS-CoV-2 target nucleic acids are NOT DETECTED.  The SARS-CoV-2 RNA is generally detectable in upper and lower respiratory specimens during the acute phase of infection. The lowest concentration of SARS-CoV-2 viral copies this assay can detect is 250 copies / mL. A negative result does not preclude SARS-CoV-2 infection and should not be used as the sole basis for treatment or other patient management decisions.  A negative result may occur with improper specimen collection / handling, submission of specimen other than nasopharyngeal swab,  presence of viral mutation(s) within the areas targeted by this assay, and inadequate number of viral copies (<250 copies / mL). A negative result must be combined with clinical observations, patient history, and epidemiological information.  Fact Sheet for Patients:   StrictlyIdeas.no  Fact Sheet for Healthcare Providers: BankingDealers.co.za  This test is not yet approved or  cleared by the [place] FDA and has been authorized for detection and/or diagnosis of SARS-CoV-2 by FDA under an Emergency Use Authorization (EUA).  This EUA will remain in effect (meaning this test can be used) for the duration of the COVID-19 declaration under Section 564(b)(1) of the Act, 21 U.S.C. section 360bbb-3(b)(1), unless the authorization is terminated or revoked sooner.  Performed at [facility], [address] [place], [place] [address]     Coagulation Studies: No results for input(s): LABPROT, INR in the last 72 hours.  Urinalysis: Recent Labs    10/06/2019 1939  COLORURINE YELLOW*  LABSPEC 1.014  PHURINE 5.0  GLUCOSEU NEGATIVE  HGBUR NEGATIVE  BILIRUBINUR NEGATIVE  KETONESUR NEGATIVE  PROTEINUR 30*  NITRITE NEGATIVE  LEUKOCYTESUR NEGATIVE      Imaging: CT HEAD WO CONTRAST  Result Date: 10/12/2019 CLINICAL DATA:  Altered mental status. EXAM: CT HEAD WITHOUT CONTRAST TECHNIQUE: Contiguous axial images were obtained from the base of the skull through the vertex without intravenous contrast. COMPARISON:  None. FINDINGS: Brain: No hemorrhage. No extraaxial collection.No midline shift. There is atrophy.The basal cisterns are unremarkable. Patchy and confluent areas of decreased attenuation are noted throughout the deep and periventricular white matter of the cerebral hemispheres bilaterally, compatible with chronic microvascular ischemic disease. There are areas of encephalomalacia involving the bilateral occipital lobes. The  brainstem is unremarkable. The cerebellum is unremarkable. The sella is unremarkable. Vascular: No hyperdense vessel or unexpected calcification. Skull: The calvarium is unremarkable. The skull base is unremarkable. The visualized upper cervical spine is unremarkable. Sinuses/Orbits: There is opacification of the sphenoid sinuses. The remaining paranasal sinuses are unremarkable. The mastoid air cells are clear. Other: The visualized parotid gland is unremarkable. There is no scalp soft tissue swelling. IMPRESSION: 1. No acute intracranial abnormality. 2. Atrophy and advanced chronic microvascular ischemic disease. 3. Remote bilateral occipital lobe infarcts. 4. Sphenoid sinus disease. Electronically Signed   By: Constance Holster M.D.   On: 10/08/2019 16:21   CT CHEST WO CONTRAST  Result Date: 10/23/2019 CLINICAL DATA:  Possible pneumonia, effusion or pulmonary abscess suspected. Shortness of breath. Oxygen dependent with COPD. EXAM: CT CHEST WITHOUT CONTRAST TECHNIQUE: Multidetector CT imaging of the chest was performed following the standard protocol without IV contrast. COMPARISON:  Chest x-ray today and 04/11/2019. Lumbar spine series 04/11/2019. FINDINGS: Cardiovascular: Left-sided cardiac pacemaker  intact. Previous median sternotomy. Mild cardiomegaly. Calcified plaque over the coronary arteries. Moderate calcified plaque throughout the thoracic aorta. Calcified plaque over the common carotid arteries and subclavian arteries. Remaining vascular structures are unremarkable. Mediastinum/Nodes: Multiple shotty mediastinal lymph nodes likely reactive. No definite hilar adenopathy. Remaining mediastinal structures are unremarkable. Lungs/Pleura: Lungs are adequately inflated demonstrate a patchy bilateral airspace process with hazy attenuation mild associated interstitial prominence likely multifocal pneumonia. There is a moderate size right effusion and small left effusion. Central airways are within normal.  Upper Abdomen: Moderate calcified plaque over the abdominal aorta. 4 cm cyst over the right kidney with smaller cyst anteriorly. No acute findings in the upper abdomen. Musculoskeletal: Known moderate L1 compression fracture. Mild depression of the superior endplate of T4. IMPRESSION: 1. Bilateral multifocal airspace process likely multifocal pneumonia. Moderate associated right effusion and small left effusion. 2. Mild cardiomegaly. Atherosclerotic coronary artery disease. Left-sided pacemaker. 3.  Aortic Atherosclerosis (ICD10-I70.0). 4.  Left renal cysts. 4. Stable moderate L1 compression fracture. Minimal depression of midportion of T4 superior endplate. Electronically Signed   By: Marin Olp M.D.   On: 10/15/2019 09:32   US RENAL  Result Date: 10/22/2019 CLINICAL DATA:  Chronic renal disease. EXAM: RENAL / URINARY TRACT ULTRASOUND COMPLETE COMPARISON:  Renal ultrasound 01/02/2016. FINDINGS: Right Kidney: Renal measurements: 9.6 x 4.4 x 5.2 cm = volume: 115 mL . Echogenicity is increased. No mass or hydronephrosis visualized. Left Kidney: Renal measurements: 9.1 x 5.0 x 4.7 cm = volume: 110 mL. Echogenicity is increased. No solid mass or hydronephrosis visualized. Three simple cysts are identified. The largest is in upper pole and measures 3.7 cm in diameter. Bladder: Appears normal for degree of bladder distention. Other: None. IMPRESSION: Negative for hydronephrosis or other acute abnormality. Increased cortical echogenicity suggestive of medical renal disease. Three simple left renal cysts. Electronically Signed   By: Inge Rise M.D.   On: 10/12/2019 12:31   DG Chest Portable 1 View  Result Date: 10/30/2019 CLINICAL DATA:  Shortness of breath.  Respiratory distress. EXAM: PORTABLE CHEST 1 VIEW COMPARISON:  Two-view chest x-ray 04/11/2019 FINDINGS: The heart is enlarged. Atherosclerotic changes are noted at the aortic arch. Pacing wires are stable. Diffuse interstitial and airspace opacities are  present bilaterally. Bilateral effusions are present. No pneumothorax is present. IMPRESSION: Cardiomegaly with diffuse interstitial and airspace disease and bilateral effusions. Findings are consistent with congestive heart failure. Multi lobar pneumonia is also considered. Electronically Signed   By: San Morelle M.D.   On: 10/08/2019 06:42   ECHOCARDIOGRAM COMPLETE  Result Date: 10/12/2019    ECHOCARDIOGRAM REPORT   Patient Name:   FAUSTINE TATES Date of Exam: 10/07/2019 Medical Rec #:  [id number]     Height:       64.0 in Accession #:    [id number]    Weight:       122.6 lb Date of Birth:  1935/06/20     BSA:          1.589 m Patient Age:    71 years      BP:           163/58 mmHg Patient Gender: F             HR:           60 bpm. Exam Location:  [facility] Procedure: 2D Echo, Cardiac Doppler and Color Doppler Indications:     CHF- acute systolic 400.86  History:         Patient  has prior history of Echocardiogram examinations, most                  recent 05/04/2018. CHF, CAD, Pacemaker, COPD, Arrythmias:Atrial                  Fibrillation; Risk Factors:Hypertension and Diabetes.  Sonographer:     Sherrie Sport RDCS (AE) Referring Phys:  [id number] Collier Bullock Diagnosing Phys: Serafina Royals MD  Sonographer Comments: Suboptimal apical window. IMPRESSIONS  1. Left ventricular ejection fraction, by estimation, is 45 to 50%. The left ventricle has mildly decreased function. The left ventricle demonstrates regional wall motion abnormalities (see scoring diagram/findings for description). Left ventricular diastolic parameters were normal.  2. Right ventricular systolic function is normal. The right ventricular size is normal. There is severely elevated pulmonary artery systolic pressure.  3. Left atrial size was mild to moderately dilated.  4. Right atrial size was mildly dilated.  5. The mitral valve is normal in structure. Moderate mitral valve regurgitation.  6. The aortic valve is normal in structure. Aortic valve  regurgitation is trivial. FINDINGS  Left Ventricle: Left ventricular ejection fraction, by estimation, is 45 to 50%. The left ventricle has mildly decreased function. The left ventricle demonstrates regional wall motion abnormalities. Moderate hypokinesis of the left ventricular, entire septal wall. The left ventricular internal cavity size was normal in size. There is no left ventricular hypertrophy. Left ventricular diastolic parameters were normal. Right Ventricle: The right ventricular size is normal. No increase in right ventricular wall thickness. Right ventricular systolic function is normal. There is severely elevated pulmonary artery systolic pressure. The tricuspid regurgitant velocity is 3.66 m/s, and with an assumed right atrial pressure of 10 mmHg, the estimated right ventricular systolic pressure is 02.4 mmHg. Left Atrium: Left atrial size was mild to moderately dilated. Right Atrium: Right atrial size was mildly dilated. Pericardium: There is no evidence of pericardial effusion. Mitral Valve: The mitral valve is normal in structure. Moderate mitral valve regurgitation. Tricuspid Valve: The tricuspid valve is normal in structure. Tricuspid valve regurgitation is mild. Aortic Valve: The aortic valve is normal in structure. Aortic valve regurgitation is trivial. Aortic valve mean gradient measures 3.0 mmHg. Aortic valve peak gradient measures 4.9 mmHg. Aortic valve area, by VTI measures 2.39 cm. Pulmonic Valve: The pulmonic valve was normal in structure. Pulmonic valve regurgitation is not visualized. Aorta: The aortic root and ascending aorta are structurally normal, with no evidence of dilitation. IAS/Shunts: No atrial level shunt detected by color flow Doppler.  LEFT VENTRICLE PLAX 2D LVIDd:         4.93 cm  Diastology LVIDs:         3.79 cm  LV e' lateral:   5.98 cm/s LV PW:         1.60 cm  LV E/e' lateral: 17.2 LV IVS:        1.44 cm  LV e' medial:    5.00 cm/s LVOT diam:     2.00 cm  LV E/e'  medial:  20.6 LV SV:         52 LV SV Index:   32 LVOT Area:     3.14 cm  RIGHT VENTRICLE RV Basal diam:  3.80 cm RV S prime:     7.62 cm/s TAPSE (M-mode): 2.4 cm LEFT ATRIUM             Index       RIGHT ATRIUM  Index LA diam:        5.50 cm 3.46 cm/m  RA Area:     22.10 cm LA Vol (A2C):   59.2 ml 37.26 ml/m RA Volume:   66.60 ml  41.92 ml/m LA Vol (A4C):   66.8 ml 42.04 ml/m LA Biplane Vol: 64.8 ml 40.78 ml/m  AORTIC VALVE AV Area (Vmax):    1.76 cm AV Area (Vmean):   1.58 cm AV Area (VTI):     2.39 cm AV Vmax:           110.67 cm/s AV Vmean:          75.200 cm/s AV VTI:            0.216 m AV Peak Grad:      4.9 mmHg AV Mean Grad:      3.0 mmHg LVOT Vmax:         62.10 cm/s LVOT Vmean:        37.800 cm/s LVOT VTI:          0.164 m LVOT/AV VTI ratio: 0.76  AORTA Ao Root diam: 2.80 cm MITRAL VALVE                TRICUSPID VALVE MV Area (PHT): 3.42 cm     TR Peak grad:   53.6 mmHg MV Decel Time: 222 msec     TR Vmax:        366.00 cm/s MV E velocity: 103.00 cm/s MV A velocity: 50.00 cm/s   SHUNTS MV E/A ratio:  2.06         Systemic VTI:  0.16 m                             Systemic Diam: 2.00 cm Serafina Royals MD Electronically signed by Serafina Royals MD Signature Date/Time: 10/28/2019/3:35:33 PM    Final      Medications:   . sodium chloride    . azithromycin Stopped (10/10/19 1002)   . amiodarone  200 mg Oral Daily  . amLODipine  10 mg Oral Q breakfast  . atorvastatin  20 mg Oral q1800  . buPROPion  75 mg Oral BID  . cholecalciferol  1,000 Units Oral Daily  . docusate sodium  100 mg Oral BID  . furosemide  40 mg Intravenous Q12H  . heparin  5,000 Units Subcutaneous Q8H  . hydrALAZINE  25 mg Oral TID  . insulin aspart  0-9 Units Subcutaneous TID WC  . isosorbide mononitrate  120 mg Oral Daily  . metoprolol succinate  100 mg Oral Daily  . oxybutynin  10 mg Oral QHS  . pantoprazole  40 mg Oral Daily  . polyethylene glycol  17 g Oral Daily  . potassium chloride SA  20 mEq Oral  QODAY  . sodium chloride flush  3 mL Intravenous Q12H   sodium chloride, acetaminophen, acetaminophen, fluticasone, HYDROcodone-acetaminophen, ipratropium-albuterol, LORazepam, ondansetron (ZOFRAN) IV, sodium chloride flush  Assessment/ Plan:  Ms. NAIARA LOMBARDOZZI is a 84 y.o. white female with COPD, atrial fibrillation, diastolic congestive heart failure, carotid artery disease status post bilateral CEA, coronary artery disease status post CABG, diabetes mellitus type II, GERD, hypertension, depression, anxiety, who was admitted to [facility] on 10/26/2019 for Acute CHF (congestive heart failure) ([place]) [I50.9] Acute on chronic combined systolic (congestive) and diastolic (congestive) heart failure ([place]) [I50.43]  1. Acute renal failure on chronic kidney disease stage IV with proteinuria: baseline creatinine of 1.71,  GFR of 28 on 07/25/19 Chronic kidney disease of unclear etiology Acute renal failure secondary to concurrent disease of COPD and acute cardiorenal syndrome.  Not on an ACE-I/ARB at home.  Ultrasound without obstruction.  - CKD work up: Pending SPEP/UPEP, HIV, PTH   2. Acute exacerbation of diastolic congestive heart failure with hypertension and history of atrial fibrillation on apixaban for anticoagulation.  Home regimen of furosemide, isosorbide mononitrate, metoprolol, amlodipine and hydralazine.  - IV furosemide 40mg  IV q12  3. Hypokalemia: - PO potassium   4. Anemia with chronic kidney disease and iron deficiency: normocytic. Hemoglobin 8.1.  - Recommend supplemental iron.   5. Acute exacerbation of COPD and bilateral multilobar pneumonia: requiring noninvasive ventilation on admission. Currently on 4 liters of oxygen.  - Empiric azithromycin and ceftriaxone - currently not on systemic steroids - nebulizers   LOS: 1 Johnnetta Holstine 8/10/20213:10 PM

## 2019-10-10 NOTE — ED Notes (Signed)
NP Ouma at bedside to assess pt.

## 2019-10-10 NOTE — ED Notes (Signed)
Pt sitting up in bed. Breakfast tray provided. Daughter called and pt talked to her.

## 2019-10-10 NOTE — ED Notes (Signed)
MD Ouma paged and verbal order placed for repeat VBG before restarting bipap. MD Stark Klein will assess pt in person before starting pt on bipap.

## 2019-10-10 NOTE — ED Notes (Signed)
Message sent to MD [facility] regarding pt O2 levels and need to restart bipap.

## 2019-10-10 NOTE — ED Notes (Signed)
Spoke w MD Stark Klein, obtain repeat VBG before proceeding with bipap.

## 2019-10-10 NOTE — ED Notes (Signed)
nephrology at bedside

## 2019-10-10 NOTE — ED Notes (Signed)
Pt asleep in bed at this time, vss.

## 2019-10-10 NOTE — Progress Notes (Signed)
Progress Note  Patient Name: Wanda Brooks Date of Encounter: 10/10/2019  Primary Cardiologist: Caryl Comes  Subjective   More alert this morning. Up sitting in bed without chest pain and improved dyspnea. Transitioned from NRB to HFNC. Documented UOP ~ 800-900 mL. No weights for review. Renal function stable.  Complains of dry mouth.  Inpatient Medications    Scheduled Meds: . amiodarone  200 mg Oral Daily  . amLODipine  10 mg Oral Q breakfast  . apixaban  2.5 mg Oral BID  . atorvastatin  20 mg Oral q1800  . buPROPion  75 mg Oral BID  . cholecalciferol  1,000 Units Oral Daily  . docusate sodium  100 mg Oral BID  . furosemide  40 mg Intravenous Q12H  . hydrALAZINE  25 mg Oral TID  . insulin aspart  0-9 Units Subcutaneous TID WC  . isosorbide mononitrate  120 mg Oral Daily  . metoprolol succinate  100 mg Oral Daily  . oxybutynin  10 mg Oral QHS  . pantoprazole  40 mg Oral Daily  . polyethylene glycol  17 g Oral Daily  . potassium chloride SA  20 mEq Oral QODAY  . sodium chloride flush  3 mL Intravenous Q12H   Continuous Infusions: . sodium chloride    . azithromycin 500 mg (10/10/19 0902)   PRN Meds: sodium chloride, acetaminophen, acetaminophen, fluticasone, HYDROcodone-acetaminophen, ipratropium-albuterol, LORazepam, ondansetron (ZOFRAN) IV, sodium chloride flush   Vital Signs    Vitals:   10/10/19 0700 10/10/19 0758 10/10/19 0805 10/10/19 0904  BP: (!) 132/50  (!) 142/51 (!) 143/49  Pulse: (!) 59  67   Resp: (!) 22  (!) 22   Temp:  97.9 F (36.6 C)    TempSrc:  Oral    SpO2: 92%  94%   Weight:      Height:        Intake/Output Summary (Last 24 hours) at 10/10/2019 0934 Last data filed at 10/10/2019 0758 Gross per 24 hour  Intake --  Output 900 ml  Net -900 ml   Filed Weights   10/06/2019 0529  Weight: 55.6 kg    Telemetry    paced - Personally Reviewed  ECG    No new tracings - Personally Reviewed  Physical Exam   GEN:  Elderly and  frail-appearing, no acute distress.   Neck: JVD elevated to the mid neck. Cardiac: RRR, II/VI systolic murmur LSB, no rubs, or gallops.  Respiratory:  Expiratory wheezing noted on anterior auscultation without wheezing noted on posterior auscultation improved breath sounds to posterior auscultation without crackles noted.  On supplemental oxygen via nasal cannula. GI: Soft, nontender, non-distended.   MS: No edema; No deformity. Neuro:  Alert and oriented x 3; Nonfocal.  Psych: Normal affect.  Labs    Chemistry Recent Labs  Lab 10/08/2019 0535 10/10/19 0610  NA 143 143  K 4.6 4.0  CL 112* 111  CO2 19* 19*  GLUCOSE 182* 120*  BUN 48* 57*  CREATININE 1.91* 1.91*  CALCIUM 8.8* 8.3*  PROT 7.5  --   ALBUMIN 3.1*  --   AST 24  --   ALT 13  --   ALKPHOS 58  --   BILITOT 0.9  --   GFRNONAA 24* 24*  GFRAA 28* 28*  ANIONGAP 12 13     Hematology Recent Labs  Lab 10/06/2019 0535 10/10/19 0815  WBC 21.6* 19.9*  RBC 3.29* 2.63*  HGB 9.9* 8.1*  HCT 30.4* 24.9*  MCV 92.4  94.7  MCH 30.1 30.8  MCHC 32.6 32.5  RDW 15.3 15.2  PLT 550* 452*    Cardiac EnzymesNo results for input(s): TROPONINI in the last 168 hours. No results for input(s): TROPIPOC in the last 168 hours.   BNP Recent Labs  Lab 10/04/2019 0900  BNP 1,852.6*     DDimer No results for input(s): DDIMER in the last 168 hours.   Radiology    CT HEAD WO CONTRAST  Result Date: 10/08/2019 IMPRESSION: 1. No acute intracranial abnormality. 2. Atrophy and advanced chronic microvascular ischemic disease. 3. Remote bilateral occipital lobe infarcts. 4. Sphenoid sinus disease. Electronically Signed   By: Constance Holster M.D.   On: 10/18/2019 16:21   CT CHEST WO CONTRAST  Result Date: 10/13/2019 IMPRESSION: 1. Bilateral multifocal airspace process likely multifocal pneumonia. Moderate associated right effusion and small left effusion. 2. Mild cardiomegaly. Atherosclerotic coronary artery disease. Left-sided pacemaker. 3.   Aortic Atherosclerosis (ICD10-I70.0). 4.  Left renal cysts. 4. Stable moderate L1 compression fracture. Minimal depression of midportion of T4 superior endplate. Electronically Signed   By: Marin Olp M.D.   On: 10/03/2019 09:32   US RENAL  Result Date: 10/08/2019 IMPRESSION: Negative for hydronephrosis or other acute abnormality. Increased cortical echogenicity suggestive of medical renal disease. Three simple left renal cysts. Electronically Signed   By: Inge Rise M.D.   On: 10/04/2019 12:31   DG Chest Portable 1 View  Result Date: 10/18/2019 IMPRESSION: Cardiomegaly with diffuse interstitial and airspace disease and bilateral effusions. Findings are consistent with congestive heart failure. Multi lobar pneumonia is also considered. Electronically Signed   By: San Morelle M.D.   On: 10/06/2019 06:42    Cardiac Studies   LHC 2015: 50% left main, 60-70 % right coronary artery, severe proximal LAD disease and 80% second obtuse marginal.Patent CABG grafts. __________  2D echo 12/2015: LVEF 60-70%, Mod/Sev MR __________  2D echo 09/2016: LVEF 55-60%, Mod LVD, Mod MR, LAE __________  2D echo 06/2017: LVEF 35-40%, severe MR, LVH __________  2D echo 09/2017: LVEF 30-35%, Grade 2 DD, Mild AI, Mod/Sev MR, Mod LAE, PA peak pressure 34 mm Hg. __________  2D echo 10/31/2019 as read by outside cardiology group: 1. Left ventricular ejection fraction, by estimation, is 45 to 50%. The  left ventricle has mildly decreased function. The left ventricle  demonstrates regional wall motion abnormalities (see scoring  diagram/findings for description). Left ventricular  diastolic parameters were normal.  2. Right ventricular systolic function is normal. The right ventricular  size is normal. There is severely elevated pulmonary artery systolic  pressure.  3. Left atrial size was mild to moderately dilated.  4. Right atrial size was mildly dilated.  5. The mitral valve is  normal in structure. Moderate mitral valve  regurgitation.  6. The aortic valve is normal in structure. Aortic valve regurgitation is  trivial.   Patient Profile     84 y.o. 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 fib on Eliquis status post DCCV in 07/2017 with repeat DCCV in 09/2017, symptomatic bradycardia status post PPM in 2013 with BiV upgrade with His lead placement in 10/2017 CKD stage IV, frequent falls with recurrent orthopedic injury, mitral regurgitation, remote CVA, DM2, HTN, HLD, and chronic hypoxic respiratory failure on supplemental oxygen secondary to COPD with ongoing tobacco use who is being seen today for the evaluation of acute on chronic combined systolic and diastolic CHF  at the request of Dr. Francine Graven.  Assessment & Plan    1. Acute on chronic hypoxic respiratory failure: -Improving -Multifactorial including multilobar pneumonia, COPD exacerbation, acute on chronic combined systolic and diastolic CHF, and progressive anemia -PE is less likely a contributing factor to her presentation given she is adequately anticoagulated with Eliquis -Has been weaned from BiPAP to HFNC, continue to wean to baseline supplemental oxygen as able -Supportive care and management as outlined below -Palliative care consult for [place]  2.  Acute on chronic combined systolic and diastolic CHF secondary to ischemic cardiomyopathy/pulmonary hypertension: -Improving -Bilateral pleural effusions noted on CT of the chest with right being greater than left with BNP greater than 18,000 this admission -Echo as read by outside cardiology group reported as improved LV SF to 45 to 50% with prior echo indicating EF of 35 to 40% from 05/2018 -Echo this admission with elevated RVSP of 63.6 mmHg -Continue IV Lasix 40 mg twice daily with close monitoring of urine output and renal function -Toprol-XL -Imdur/hydralazine -Not  currently on ACE inhibitor/ARB/Entresto/MRA in the setting of CKD -Daily weights -Strict I's and O's  3.  CAD status post CABG with elevated troponin: -Initial high-sensitivity troponin mildly elevated at 40 with a delta of 56, continue to cycle until peak -No indication for heparin drip at this time without dynamic troponin elevation -Echo as outlined above -On Eliquis in place of aspirin to minimize bleeding risk -Toprol-XL -Atorvastatin  4.  Persistent A. fib: -Maintaining sinus rhythm -PTA amiodarone and Toprol-XL -CHA2DS2-VASc 9 (CHF, HTN, age x2, diabetes, remote CVA noted on CT imaging this admission, vascular disease, gender) -Her hemoglobin has trended down to 8.1 this morning, we will hold Eliquis given frequent falls and until her anemia is further evaluated  5.  Anemia: -Anemia has trended down to 8.1 this morning with a baseline around 13  -Uncertain etiology -Work-up per internal medicine -Patient's daughter notes the patient has not noted any hematochezia or melena with stools being light in color more recently -Hold Eliquis given downtrending hemoglobin as outlined above -With discontinuation of Eliquis she has been placed on subcutaneous heparin for VTE PPx  6.  Acute on CKD stage IV: -Stable -Baseline serum creatinine appears to be around 1.6-1.7 -Monitor with diuresis -Work-up per nephrology pending -Nephrology is following  7.  Frequent falls: -Patient's daughter indicates the patient has fallen approximately twice per week dating back to 04/2019 in the setting of generalized weakness and progressive frail state -Most recent fall on 8/8 in which the patient struck her head on an aquarium table -CT head this admission nonacute with incidentally noted remote CVA -Eliquis held as of this morning given downtrending hemoglobin, may need to consider discontinuation of [place] altogether with her comorbid conditions -Recommend PT/OT evaluation  8.  Symptomatic  bradycardia: -Status post PPM with His bundle upgrade in 10/2017 -Followed by EP  9.  HTN:  -Blood pressure stable -Continue current medications as outlined above along with PTA amlodipine, which can be held if needed for BP room to diurese  10.  Multifocal pneumonia/COPD exacerbation/tobacco use: -Management per primary service -Complete cessation of tobacco is recommended  For questions or updates, please contact [place] Please consult [email or web address] for contact info under Cardiology/STEMI.    Signed, Christell Faith, PA-C [place] Pager: [phone] 10/10/2019, 9:34 AM

## 2019-10-10 NOTE — Evaluation (Signed)
Occupational Therapy Evaluation Patient Details Name: Wanda Brooks MRN: [id number] DOB: 1935-09-26 Today's Date: 10/10/2019    History of Present Illness Wanda Brooks is a 84 y.o. female with medical history significant for COPD, Stage 4 CKD, atrial fibrillation, CAD, CABG, MI, DM, pace maker, multiple right hip surgeries. She presents to the ER for evaluation of shortness of breath which she has had progressively over the last couple of weeks. Patient was hypoxic in the field with pulse oximetry of 69% on room air. Patient was placed on a Bipap in the ER. CXR showed CHF and multi focal pneumonia. Immaging negative for acute infarct; shows Remote bilateral occipital lobe infarcts & Stable moderate L1 compression fracture. Minimal depression of midportion of T4 superior endplate.   Clinical Impression   Wanda Brooks was seen for OT evaluation this date. Pt received semi-supine in bed with dtr present at bedside. Per pt/dtr pt has required significant increase in assistance needed for ADL and mobility. She is using a Rolator for functional transfers by sitting on it and having family assist with pushing. Her daughter reports she has required increased assistance to dress and complete her sponge baths as well. Pt denies use of supplemental O2 at baseline, however, she reports becoming easily fatigued or out of breath with minimal exertion. Pt currently requires MAX A for LB dressing, bathing, and toileting at bed level due to current functional impairments (See OT Problem List below). Pt educated in energy conservation strategies including pursed lip breathing, activity pacing, and falls prevention. Pt verbalized understanding and would benefit from additional skilled OT services to maximize recall and carryover of learned techniques and facilitate implementation of learned techniques into daily routines. Pt states her goal is to be more independent and to be able to do things like "fix myself a bowl of cereal  in the morning". Upon discharge, recommend STR to maximize pt safety and return to PLOF.    Follow Up Recommendations  SNF;Supervision/Assistance - 24 hour    Equipment Recommendations  None recommended by OT (Pt has necessary equipment.)    Recommendations for Other Services       Precautions / Restrictions Precautions Precautions: Fall;Posterior Hip Precaution Comments: Pt dtr at bedside reports, pt has permanent posterior THP's secondary to multiple R hip sx. Restrictions Weight Bearing Restrictions: No      Mobility Bed Mobility Overal bed mobility: Needs Assistance Bed Mobility: Rolling Rolling: Mod assist         General bed mobility comments: Mod A to roll side to side for linnen change/clean up during session.  Transfers                 General transfer comment: Deferred. Pt declines mobility.    Balance Overall balance assessment: Needs assistance Sitting-balance support: Feet supported;Bilateral upper extremity supported Sitting balance-Leahy Scale: Fair Sitting balance - Comments: Pt requires assist to come to long-sitting in bed w/o back support.       Standing balance comment: Not tested                           ADL either performed or assessed with clinical judgement   ADL Overall ADL's : Needs assistance/impaired                                       General ADL Comments: Pt is functionally  limited by decreased activity tolerance, generalized weakness, and cardiopulmonary status. She becomes SOB easily (O2 sats with during ADL mgt 88-91% t/o session with pt on 13L HFNC) and fatigues quickly with activity. MOD A for bed-level peri-care after episode of bladder incontinence, MAX A for seated LB/UB ADL management including bathing and dressing. Pt is able to perform bed-level self-feeding/drinking with set-up assist.     Vision Baseline Vision/History: Wears glasses Wears Glasses: Reading only Patient Visual  Report: No change from baseline Vision Assessment?: No apparent visual deficits Additional Comments: Pt denies visual changes, dtr states pt vision seems to be at baseline, pt is able to read, complete word search books, etc. Will continue to assess within functional context in setting of remote bilat occipital infarcts.     Perception     Praxis      Pertinent Vitals/Pain Pain Assessment: No/denies pain     Hand Dominance Right   Extremity/Trunk Assessment Upper Extremity Assessment Upper Extremity Assessment: Generalized weakness   Lower Extremity Assessment Lower Extremity Assessment: Generalized weakness       Communication Communication Communication: HOH   Cognition Arousal/Alertness: Awake/alert Behavior During Therapy: WFL for tasks assessed/performed Overall Cognitive Status: Difficult to assess                                 General Comments: Pt oriented to self, place, and basic situation. Dtr at bedside states she is at baseline. Requires increased VCs/multimodal prompting likely 2/2 impaired hearing and only having one hearing aid during evaluation.   General Comments  Pt on 13 L HFNC t/o session. Upon arrival to pt room pt noted to be satting 88% at rest. With repositioning and instruction on PLB technique pt O2 sats reach 92%, but do not maintain well. Pt observed with open mouth posture breathing t/o session and requires cueing for PLB. RN notified/aware.    Exercises Other Exercises Other Exercises: Pt/Caregiver (Dtr at bedside) educated on falls prevention strategies, safe use of AE/DME for ADL management, and energy conservation strategies with focus on PLB during bed-level toileting/hygiene during session. Other Exercises: OT facilitates bed-level peri-care after pt noted to be soiled with urine. See ADL section for additional detail.   Shoulder Instructions      Home Living Family/patient expects to be discharged to:: Private  residence Living Arrangements: Children (Dtr works from home) Available Help at Discharge: Family Type of Home: House Home Access: [place]: One level     Bathroom Shower/Tub: Tub/shower unit (Modified to be step-in)   Biochemist, clinical: Handicapped height     Home Equipment: Environmental consultant - 4 wheels;Wheelchair - manual;Hand held shower head;Shower seat;Bedside commode;Adaptive equipment Adaptive Equipment: Reacher;Long-handled shoe horn;Long-handled sponge (Does not use.) Additional Comments: Lift chiar      Prior Functioning/Environment Level of Independence: Needs assistance  Gait / Transfers Assistance Needed: Per dtr, pt with significant decline over past several months. Performs SPT to rollator and has been using rollator as WC. Family recently got a WC for pt. Pt was able to walk short distances (down the hall to her BR a few months ago) ADL's / Homemaking Assistance Needed: Pt requires assist with most ADL management at baseline, and has demonstrated increased needs over past several months. Dtr assists with bathing (sponge baths), dressing, toileting, and all IADL management.            OT Problem List: Decreased strength;Decreased  coordination;Cardiopulmonary status limiting activity;Decreased activity tolerance;Decreased safety awareness;Impaired balance (sitting and/or standing);Decreased knowledge of use of DME or AE      OT Treatment/Interventions: Self-care/ADL training;Therapeutic exercise;Therapeutic activities;Patient/family education;DME and/or AE instruction;Balance training;Energy conservation    OT Goals(Current goals can be found in the care plan section) Acute Rehab OT Goals Patient Stated Goal: To be able to fix myself a bowl of cereal again. OT Goal Formulation: With patient/family Time For Goal Achievement: 10/24/19 Potential to Achieve Goals: Good ADL Goals Pt Will Perform Upper Body Dressing: sitting;with min assist;with caregiver  independent in assisting (c LRAD PRN for improved safety and functional indep.) Pt Will Perform Lower Body Dressing: sit to/from stand;with caregiver independent in assisting;with min assist (c LRAD PRN for improved safety and functional indep.) Pt Will Transfer to Toilet: bedside commode;with min guard assist;stand pivot transfer (c LRAD PRN for improved safety and functional indep.) Pt Will Perform Toileting - Clothing Manipulation and hygiene: with min assist;sit to/from stand;with caregiver independent in assisting (c LRAD PRN for improved safety and functional indep. c LRAD PRN for improved safety and functional indep.)  OT Frequency: Min 2X/week   Barriers to D/C:            Co-evaluation              AM-PAC OT "6 Clicks" Daily Activity     Outcome Measure Help from another person eating meals?: A Little Help from another person taking care of personal grooming?: A Little Help from another person toileting, which includes using toliet, bedpan, or urinal?: A Lot Help from another person bathing (including washing, rinsing, drying)?: A Lot Help from another person to put on and taking off regular upper body clothing?: A Lot Help from another person to put on and taking off regular lower body clothing?: A Lot 6 Click Score: 14   End of Session Equipment Utilized During Treatment: Oxygen Nurse Communication: Mobility status;Other (comment) (Vitals during.)  Activity Tolerance: Patient tolerated treatment well Patient left: in bed;with call bell/phone within reach;with family/visitor present (In gurney bed)  OT Visit Diagnosis: Other abnormalities of gait and mobility (R26.89);Muscle weakness (generalized) (M62.81)                Time: 8786-7672 OT Time Calculation (min): 39 min Charges:  OT General Charges $OT Visit: 1 Visit OT Evaluation $OT Eval Moderate Complexity: 1 Mod OT Treatments $Self Care/Home Management : 23-37 mins  Shara Blazing, M.S., OTR/L Ascom:  [phone] 10/10/19, 4:09 PM

## 2019-10-10 NOTE — Progress Notes (Signed)
PROGRESS NOTE    Wanda Brooks  WYO:[id number] DOB: Sep 20, 1935 DOA: 10/02/2019 PCP: Jearld Fenton, NP    Brief Narrative:  84 year old female with extensive medical issues and cardiovascular problems including COPD, stage IV chronic kidney disease baseline creatinine about 1.7, A. fib, smoker, chronic congestive heart failure, coronary artery disease status post CABG, permanent pacemaker who presents to the emergency room with progressive shortness of breath for about 2 weeks. At the emergency room hypoxic with 69% on room air, placed on BiPAP.  Chest x-ray showed CHF with multifocal pneumonia.  CT scan of the chest consistent with fluid overload as well as multifocal airspace opacities.  Admitted with acute respiratory failure due to multiple issues.   Assessment & Plan:   Principal Problem:   Acute on chronic combined systolic (congestive) and diastolic (congestive) heart failure (HCC) Active Problems:   Chronic atrial fibrillation (HCC)   Type 2 diabetes mellitus with stage 4 chronic kidney disease (HCC)   Essential hypertension   Coronary artery disease involving coronary bypass graft of native heart without angina pectoris   COPD (chronic obstructive pulmonary disease) (HCC)   Anxiety and depression   History of anemia due to chronic kidney disease   Respiratory failure, acute (HCC)   Acute respiratory failure (HCC)  Acute hypoxic respiratory failure, multifactorial. Acute on chronic combined heart failure: Known ejection fraction 35 to 40%.  Progressive shortness of breath. Treated with BiPAP overnight.  Currently on high flow nasal cannula oxygen.  Continue to wean off oxygen to keep saturation more than 89%. Treated with Lasix 40 mg twice a day, urine output monitoring.  Renal function monitoring. Patient is already on heart failure therapy including Toprol-XL, nitrates and hydralazine.  Not on ACE inhibitors due to advanced kidney disease. Continue monitoring.  Intake and  output. Followed by palliative care, poor prognosis.  DNR/DNI.  Palliative care at home on discharge.  Probable multilobar pneumonia: Unsure whether all related to heart failure.  Elevated WBC count and CT scan evidence of pneumonia.  Cultures are negative so far.  Remains on Rocephin and azithromycin.  Continue chest physiotherapy and bronchodilator therapy.  Chronic atrial fibrillation: Rate controlled.  Status post pacemaker.  Eliquis on hold.  Continue amiodarone.  Diabetes mellitus: Not on treatment at home.  On insulin sliding scale.  Stable.  Acute kidney injury on chronic kidney disease stage IV: At about baseline.  Close monitoring on diuresis.  Followed by nephrology.  COPD: Stable.  As needed nebulizers.  Coronary artery disease: Stable.  No evidence of acute coronary syndrome.  Depression/anxiety: On multiple regimen that she continued from home.  Hypertension: Stable on current regimen of nitrates and hydralazine.  Anemia of chronic disease: Hemoglobin 8.1.  Recent drop.  No evidence of active bleeding.  Will monitor.  Eliquis on hold.  Probably not a candidate for endoscopic evaluation.  Will check FOBT. Her B12 level is 176 which is very low, will replace with injectable cyanocobalamin while she is in the hospital and replace by mouth.   DVT prophylaxis: heparin injection 5,000 Units Start: 10/10/19 1400   Code Status: DNR/DNI Family Communication: Called patient's daughter, unable to get through. Disposition Plan: Status is: Inpatient  Remains inpatient appropriate because:IV treatments appropriate due to intensity of illness or inability to take PO and Inpatient level of care appropriate due to severity of illness   Dispo: The patient is from: Home              Anticipated d/c is  to: Home              Anticipated d/c date is: 3 days              Patient currently is not medically stable to d/c.         Consultants:    Cardiology  Nephrology  Palliative  Procedures:   None  Antimicrobials:  Antibiotics Given (last 72 hours)    Date/Time Action Medication Dose Rate   10/13/2019 0810 New Bag/Given   cefTRIAXone (ROCEPHIN) 2 g in sodium chloride 0.9 % 100 mL IVPB 2 g 200 mL/hr   10/18/2019 0919 New Bag/Given   azithromycin (ZITHROMAX) 500 mg in sodium chloride 0.9 % 250 mL IVPB 500 mg 250 mL/hr   10/10/19 0900 New Bag/Given   cefTRIAXone (ROCEPHIN) 1 g in sodium chloride 0.9 % 100 mL IVPB 1 g 200 mL/hr   10/10/19 0902 New Bag/Given   azithromycin (ZITHROMAX) 500 mg in sodium chloride 0.9 % 250 mL IVPB 500 mg 250 mL/hr         Subjective: Patient was seen and examined.  Overnight she was weaned to high flow nasal cannula oxygen.  She was talkative.  Still feels short of breath, however she was able to talk in full sentences.  She is full of sense of humor.  She wants to quit the smoking as well as wants to encourage her grandson to quit his smoking.  Objective: Vitals:   10/10/19 1130 10/10/19 1357 10/10/19 1430 10/10/19 1543  BP: (!) 132/54  (!) 127/51 (!) 136/55  Pulse: 83  83   Resp: (!) 21  (!) 26   Temp:      TempSrc:      SpO2: 90% 90% 92%   Weight:      Height:        Intake/Output Summary (Last 24 hours) at 10/10/2019 1553 Last data filed at 10/10/2019 1059 Gross per 24 hour  Intake 222.12 ml  Output 900 ml  Net -677.88 ml   Filed Weights   10/16/2019 0529  Weight: 55.6 kg    Examination:  General exam: Appears chronically sick looking, cachectic Respiratory system: extensive expiratory crackles, on 10 liters oxygen Cardiovascular system: S1 & S2 heard, RRR. No pedal edema.  Pacemaker present. Gastrointestinal system: Abdomen is nondistended, soft and nontender. No organomegaly or masses felt. Normal bowel sounds heard. Central nervous system: Alert and oriented. No focal neurological deficits. Extremities: Symmetric 5 x 5 power. Skin: No rashes, lesions or  ulcers Psychiatry: Judgement and insight appear normal. Mood & affect appropriate.     Data Reviewed: I have personally reviewed following labs and imaging studies  CBC: Recent Labs  Lab 10/26/2019 0535 10/10/19 0815  WBC 21.6* 19.9*  NEUTROABS 17.9* 17.3*  HGB 9.9* 8.1*  HCT 30.4* 24.9*  MCV 92.4 94.7  PLT 550* 384*   Basic Metabolic Panel: Recent Labs  Lab 10/14/2019 0535 10/19/2019 1939 10/10/19 0610  NA 143  --  143  K 4.6  --  4.0  CL 112*  --  111  CO2 19*  --  19*  GLUCOSE 182*  --  120*  BUN 48*  --  57*  CREATININE 1.91*  --  1.91*  CALCIUM 8.8*  --  8.3*  PHOS  --  4.8*  --    GFR: Estimated Creatinine Clearance: 19.9 mL/min (A) (by C-G formula based on SCr of 1.91 mg/dL (H)). Liver Function Tests: Recent Labs  Lab 10/30/2019 0535  AST 24  ALT 13  ALKPHOS 58  BILITOT 0.9  PROT 7.5  ALBUMIN 3.1*   No results for input(s): LIPASE, AMYLASE in the last 168 hours. No results for input(s): AMMONIA in the last 168 hours. Coagulation Profile: No results for input(s): INR, PROTIME in the last 168 hours. Cardiac Enzymes: No results for input(s): CKTOTAL, CKMB, CKMBINDEX, TROPONINI in the last 168 hours. BNP (last 3 results) No results for input(s): PROBNP in the last 8760 hours. HbA1C: Recent Labs    10/10/19 0610  HGBA1C 6.1*   CBG: Recent Labs  Lab 10/02/2019 1740 10/10/19 0614 10/10/19 0756 10/10/19 1232  GLUCAP 133* 118* 119* 124*   Lipid Profile: No results for input(s): CHOL, HDL, LDLCALC, TRIG, CHOLHDL, LDLDIRECT in the last 72 hours. Thyroid Function Tests: No results for input(s): TSH, T4TOTAL, FREET4, T3FREE, THYROIDAB in the last 72 hours. Anemia Panel: Recent Labs    10/22/2019 1939  VITAMINB12 176*  FOLATE 9.8  FERRITIN 214  TIBC 209*  IRON 21*   Sepsis Labs: No results for input(s): PROCALCITON, LATICACIDVEN in the last 168 hours.  Recent Results (from the past 240 hour(s))  Blood culture (routine x 2)     Status: None  (Preliminary result)   Collection Time: 10/20/2019  5:34 AM   Specimen: BLOOD  Result Value Ref Range Status   Specimen Description BLOOD BLOOD LEFT WRIST  Final   Special Requests   Final    BOTTLES DRAWN AEROBIC AND ANAEROBIC Blood Culture adequate volume   Culture   Final    NO GROWTH 1 DAY Performed at [facility], [address], [place], [place] [address]    Report Status PENDING  Incomplete  Blood culture (routine x 2)     Status: None (Preliminary result)   Collection Time: 10/08/2019  5:34 AM   Specimen: BLOOD  Result Value Ref Range Status   Specimen Description BLOOD LEFT ANTECUBITAL  Final   Special Requests   Final    BOTTLES DRAWN AEROBIC AND ANAEROBIC Blood Culture adequate volume   Culture   Final    NO GROWTH 1 DAY Performed at [facility], [address], [place],  [address]    Report Status PENDING  Incomplete  SARS Coronavirus 2 by RT PCR (hospital order, performed in [facility] hospital lab) Nasopharyngeal Nasopharyngeal Swab     Status: None   Collection Time: 10/05/2019  5:35 AM   Specimen: Nasopharyngeal Swab  Result Value Ref Range Status   SARS Coronavirus 2 NEGATIVE NEGATIVE Final    Comment: (NOTE) SARS-CoV-2 target nucleic acids are NOT DETECTED.  The SARS-CoV-2 RNA is generally detectable in upper and lower respiratory specimens during the acute phase of infection. The lowest concentration of SARS-CoV-2 viral copies this assay can detect is 250 copies / mL. A negative result does not preclude SARS-CoV-2 infection and should not be used as the sole basis for treatment or other patient management decisions.  A negative result may occur with improper specimen collection / handling, submission of specimen other than nasopharyngeal swab, presence of viral mutation(s) within the areas targeted by this assay, and inadequate number of viral copies (<250 copies / mL). A negative result must be combined with clinical observations,  patient history, and epidemiological information.  Fact Sheet for Patients:   StrictlyIdeas.no  Fact Sheet for Healthcare Providers: BankingDealers.co.za  This test is not yet approved or  cleared by the [place] FDA and has been authorized for detection and/or diagnosis  of SARS-CoV-2 by FDA under an Emergency Use Authorization (EUA).  This EUA will remain in effect (meaning this test can be used) for the duration of the COVID-19 declaration under Section 564(b)(1) of the Act, 21 U.S.C. section 360bbb-3(b)(1), unless the authorization is terminated or revoked sooner.  Performed at [facility], [address] [place], [place] [address]          Radiology Studies: CT HEAD WO CONTRAST  Result Date: 10/12/2019 CLINICAL DATA:  Altered mental status. EXAM: CT HEAD WITHOUT CONTRAST TECHNIQUE: Contiguous axial images were obtained from the base of the skull through the vertex without intravenous contrast. COMPARISON:  None. FINDINGS: Brain: No hemorrhage. No extraaxial collection.No midline shift. There is atrophy.The basal cisterns are unremarkable. Patchy and confluent areas of decreased attenuation are noted throughout the deep and periventricular white matter of the cerebral hemispheres bilaterally, compatible with chronic microvascular ischemic disease. There are areas of encephalomalacia involving the bilateral occipital lobes. The brainstem is unremarkable. The cerebellum is unremarkable. The sella is unremarkable. Vascular: No hyperdense vessel or unexpected calcification. Skull: The calvarium is unremarkable. The skull base is unremarkable. The visualized upper cervical spine is unremarkable. Sinuses/Orbits: There is opacification of the sphenoid sinuses. The remaining paranasal sinuses are unremarkable. The mastoid air cells are clear. Other: The visualized parotid gland is unremarkable. There is no scalp soft tissue  swelling. IMPRESSION: 1. No acute intracranial abnormality. 2. Atrophy and advanced chronic microvascular ischemic disease. 3. Remote bilateral occipital lobe infarcts. 4. Sphenoid sinus disease. Electronically Signed   By: Constance Holster M.D.   On: 10/05/2019 16:21   CT CHEST WO CONTRAST  Result Date: 10/14/2019 CLINICAL DATA:  Possible pneumonia, effusion or pulmonary abscess suspected. Shortness of breath. Oxygen dependent with COPD. EXAM: CT CHEST WITHOUT CONTRAST TECHNIQUE: Multidetector CT imaging of the chest was performed following the standard protocol without IV contrast. COMPARISON:  Chest x-ray today and 04/11/2019. Lumbar spine series 04/11/2019. FINDINGS: Cardiovascular: Left-sided cardiac pacemaker intact. Previous median sternotomy. Mild cardiomegaly. Calcified plaque over the coronary arteries. Moderate calcified plaque throughout the thoracic aorta. Calcified plaque over the common carotid arteries and subclavian arteries. Remaining vascular structures are unremarkable. Mediastinum/Nodes: Multiple shotty mediastinal lymph nodes likely reactive. No definite hilar adenopathy. Remaining mediastinal structures are unremarkable. Lungs/Pleura: Lungs are adequately inflated demonstrate a patchy bilateral airspace process with hazy attenuation mild associated interstitial prominence likely multifocal pneumonia. There is a moderate size right effusion and small left effusion. Central airways are within normal. Upper Abdomen: Moderate calcified plaque over the abdominal aorta. 4 cm cyst over the right kidney with smaller cyst anteriorly. No acute findings in the upper abdomen. Musculoskeletal: Known moderate L1 compression fracture. Mild depression of the superior endplate of T4. IMPRESSION: 1. Bilateral multifocal airspace process likely multifocal pneumonia. Moderate associated right effusion and small left effusion. 2. Mild cardiomegaly. Atherosclerotic coronary artery disease. Left-sided  pacemaker. 3.  Aortic Atherosclerosis (ICD10-I70.0). 4.  Left renal cysts. 4. Stable moderate L1 compression fracture. Minimal depression of midportion of T4 superior endplate. Electronically Signed   By: Marin Olp M.D.   On: 10/18/2019 09:32   US RENAL  Result Date: 10/24/2019 CLINICAL DATA:  Chronic renal disease. EXAM: RENAL / URINARY TRACT ULTRASOUND COMPLETE COMPARISON:  Renal ultrasound 01/02/2016. FINDINGS: Right Kidney: Renal measurements: 9.6 x 4.4 x 5.2 cm = volume: 115 mL . Echogenicity is increased. No mass or hydronephrosis visualized. Left Kidney: Renal measurements: 9.1 x 5.0 x 4.7 cm = volume: 110 mL. Echogenicity is increased. No  solid mass or hydronephrosis visualized. Three simple cysts are identified. The largest is in upper pole and measures 3.7 cm in diameter. Bladder: Appears normal for degree of bladder distention. Other: None. IMPRESSION: Negative for hydronephrosis or other acute abnormality. Increased cortical echogenicity suggestive of medical renal disease. Three simple left renal cysts. Electronically Signed   By: Inge Rise M.D.   On: 10/10/2019 12:31   DG Chest Portable 1 View  Result Date: 10/26/2019 CLINICAL DATA:  Shortness of breath.  Respiratory distress. EXAM: PORTABLE CHEST 1 VIEW COMPARISON:  Two-view chest x-ray 04/11/2019 FINDINGS: The heart is enlarged. Atherosclerotic changes are noted at the aortic arch. Pacing wires are stable. Diffuse interstitial and airspace opacities are present bilaterally. Bilateral effusions are present. No pneumothorax is present. IMPRESSION: Cardiomegaly with diffuse interstitial and airspace disease and bilateral effusions. Findings are consistent with congestive heart failure. Multi lobar pneumonia is also considered. Electronically Signed   By: San Morelle M.D.   On: 10/27/2019 06:42   ECHOCARDIOGRAM COMPLETE  Result Date: 10/15/2019    ECHOCARDIOGRAM REPORT   Patient Name:   KENZA MUNAR Date of Exam: 10/01/2019  Medical Rec #:  [id number]     Height:       64.0 in Accession #:    [id number]    Weight:       122.6 lb Date of Birth:  December 22, 1935     BSA:          1.589 m Patient Age:    7 years      BP:           163/58 mmHg Patient Gender: F             HR:           60 bpm. Exam Location:  [facility] Procedure: 2D Echo, Cardiac Doppler and Color Doppler Indications:     CHF- acute systolic 381.01  History:         Patient has prior history of Echocardiogram examinations, most                  recent 05/04/2018. CHF, CAD, Pacemaker, COPD, Arrythmias:Atrial                  Fibrillation; Risk Factors:Hypertension and Diabetes.  Sonographer:     Sherrie Sport RDCS (AE) Referring Phys:  [id number] Collier Bullock Diagnosing Phys: Serafina Royals MD  Sonographer Comments: Suboptimal apical window. IMPRESSIONS  1. Left ventricular ejection fraction, by estimation, is 45 to 50%. The left ventricle has mildly decreased function. The left ventricle demonstrates regional wall motion abnormalities (see scoring diagram/findings for description). Left ventricular diastolic parameters were normal.  2. Right ventricular systolic function is normal. The right ventricular size is normal. There is severely elevated pulmonary artery systolic pressure.  3. Left atrial size was mild to moderately dilated.  4. Right atrial size was mildly dilated.  5. The mitral valve is normal in structure. Moderate mitral valve regurgitation.  6. The aortic valve is normal in structure. Aortic valve regurgitation is trivial. FINDINGS  Left Ventricle: Left ventricular ejection fraction, by estimation, is 45 to 50%. The left ventricle has mildly decreased function. The left ventricle demonstrates regional wall motion abnormalities. Moderate hypokinesis of the left ventricular, entire septal wall. The left ventricular internal cavity size was normal in size. There is no left ventricular hypertrophy. Left ventricular diastolic parameters were normal. Right Ventricle: The right  ventricular size is normal. No increase in right ventricular wall thickness.  Right ventricular systolic function is normal. There is severely elevated pulmonary artery systolic pressure. The tricuspid regurgitant velocity is 3.66 m/s, and with an assumed right atrial pressure of 10 mmHg, the estimated right ventricular systolic pressure is 22.2 mmHg. Left Atrium: Left atrial size was mild to moderately dilated. Right Atrium: Right atrial size was mildly dilated. Pericardium: There is no evidence of pericardial effusion. Mitral Valve: The mitral valve is normal in structure. Moderate mitral valve regurgitation. Tricuspid Valve: The tricuspid valve is normal in structure. Tricuspid valve regurgitation is mild. Aortic Valve: The aortic valve is normal in structure. Aortic valve regurgitation is trivial. Aortic valve mean gradient measures 3.0 mmHg. Aortic valve peak gradient measures 4.9 mmHg. Aortic valve area, by VTI measures 2.39 cm. Pulmonic Valve: The pulmonic valve was normal in structure. Pulmonic valve regurgitation is not visualized. Aorta: The aortic root and ascending aorta are structurally normal, with no evidence of dilitation. IAS/Shunts: No atrial level shunt detected by color flow Doppler.  LEFT VENTRICLE PLAX 2D LVIDd:         4.93 cm  Diastology LVIDs:         3.79 cm  LV e' lateral:   5.98 cm/s LV PW:         1.60 cm  LV E/e' lateral: 17.2 LV IVS:        1.44 cm  LV e' medial:    5.00 cm/s LVOT diam:     2.00 cm  LV E/e' medial:  20.6 LV SV:         52 LV SV Index:   32 LVOT Area:     3.14 cm  RIGHT VENTRICLE RV Basal diam:  3.80 cm RV S prime:     7.62 cm/s TAPSE (M-mode): 2.4 cm LEFT ATRIUM             Index       RIGHT ATRIUM           Index LA diam:        5.50 cm 3.46 cm/m  RA Area:     22.10 cm LA Vol (A2C):   59.2 ml 37.26 ml/m RA Volume:   66.60 ml  41.92 ml/m LA Vol (A4C):   66.8 ml 42.04 ml/m LA Biplane Vol: 64.8 ml 40.78 ml/m  AORTIC VALVE AV Area (Vmax):    1.76 cm AV Area  (Vmean):   1.58 cm AV Area (VTI):     2.39 cm AV Vmax:           110.67 cm/s AV Vmean:          75.200 cm/s AV VTI:            0.216 m AV Peak Grad:      4.9 mmHg AV Mean Grad:      3.0 mmHg LVOT Vmax:         62.10 cm/s LVOT Vmean:        37.800 cm/s LVOT VTI:          0.164 m LVOT/AV VTI ratio: 0.76  AORTA Ao Root diam: 2.80 cm MITRAL VALVE                TRICUSPID VALVE MV Area (PHT): 3.42 cm     TR Peak grad:   53.6 mmHg MV Decel Time: 222 msec     TR Vmax:        366.00 cm/s MV E velocity: 103.00 cm/s MV A velocity: 50.00 cm/s   SHUNTS MV  E/A ratio:  2.06         Systemic VTI:  0.16 m                             Systemic Diam: 2.00 cm Serafina Royals MD Electronically signed by Serafina Royals MD Signature Date/Time: 10/21/2019/3:35:33 PM    Final         Scheduled Meds: . amiodarone  200 mg Oral Daily  . amLODipine  10 mg Oral Q breakfast  . atorvastatin  20 mg Oral q1800  . buPROPion  75 mg Oral BID  . cholecalciferol  1,000 Units Oral Daily  . docusate sodium  100 mg Oral BID  . [START ON 11-02-19] ferrous sulfate  325 mg Oral Q breakfast  . furosemide  40 mg Intravenous Q12H  . heparin  5,000 Units Subcutaneous Q8H  . hydrALAZINE  25 mg Oral TID  . insulin aspart  0-9 Units Subcutaneous TID WC  . isosorbide mononitrate  120 mg Oral Daily  . metoprolol succinate  100 mg Oral Daily  . oxybutynin  10 mg Oral QHS  . pantoprazole  40 mg Oral Daily  . polyethylene glycol  17 g Oral Daily  . potassium chloride SA  20 mEq Oral QODAY  . sodium chloride flush  3 mL Intravenous Q12H   Continuous Infusions: . sodium chloride    . azithromycin Stopped (10/10/19 1002)     LOS: 1 day    Time spent: 30 minutes    Barb Merino, MD [facility] Pager [phone]

## 2019-10-10 NOTE — Telephone Encounter (Signed)
Patient daughter calling for update as she missed md rounds.   Please call.
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
## 2019-10-11 ENCOUNTER — Inpatient Hospital Stay: Payer: Medicare Other

## 2019-10-11 DIAGNOSIS — J9601 Acute respiratory failure with hypoxia: Secondary | ICD-10-CM | POA: Diagnosis not present

## 2019-10-11 DIAGNOSIS — I5043 Acute on chronic combined systolic (congestive) and diastolic (congestive) heart failure: Secondary | ICD-10-CM | POA: Diagnosis not present

## 2019-10-11 DIAGNOSIS — J189 Pneumonia, unspecified organism: Secondary | ICD-10-CM | POA: Diagnosis not present

## 2019-10-11 DIAGNOSIS — N184 Chronic kidney disease, stage 4 (severe): Secondary | ICD-10-CM | POA: Diagnosis not present

## 2019-10-11 DIAGNOSIS — R0600 Dyspnea, unspecified: Secondary | ICD-10-CM

## 2019-10-11 LAB — PROTEIN ELECTROPHORESIS, SERUM
A/G Ratio: 0.7 (ref 0.7–1.7)
Albumin ELP: 2.2 g/dL — ABNORMAL LOW (ref 2.9–4.4)
Alpha-1-Globulin: 0.5 g/dL — ABNORMAL HIGH (ref 0.0–0.4)
Alpha-2-Globulin: 1.2 g/dL — ABNORMAL HIGH (ref 0.4–1.0)
Beta Globulin: 1 g/dL (ref 0.7–1.3)
Gamma Globulin: 0.6 g/dL (ref 0.4–1.8)
Globulin, Total: 3.3 g/dL (ref 2.2–3.9)
Total Protein ELP: 5.5 g/dL — ABNORMAL LOW (ref 6.0–8.5)

## 2019-10-11 LAB — PROTEIN ELECTRO, RANDOM URINE
Albumin ELP, Urine: 58.9 %
Alpha-1-Globulin, U: 1.9 %
Alpha-2-Globulin, U: 8.8 %
Beta Globulin, U: 9.8 %
Gamma Globulin, U: 20.6 %
Total Protein, Urine: 35.9 mg/dL

## 2019-10-11 LAB — CBC WITH DIFFERENTIAL/PLATELET
Abs Immature Granulocytes: 0.23 10*3/uL — ABNORMAL HIGH (ref 0.00–0.07)
Basophils Absolute: 0 10*3/uL (ref 0.0–0.1)
Basophils Relative: 0 %
Eosinophils Absolute: 0 10*3/uL (ref 0.0–0.5)
Eosinophils Relative: 0 %
HCT: 25.4 % — ABNORMAL LOW (ref 36.0–46.0)
Hemoglobin: 8.1 g/dL — ABNORMAL LOW (ref 12.0–15.0)
Immature Granulocytes: 1 %
Lymphocytes Relative: 2 %
Lymphs Abs: 0.5 10*3/uL — ABNORMAL LOW (ref 0.7–4.0)
MCH: 30.5 pg (ref 26.0–34.0)
MCHC: 31.9 g/dL (ref 30.0–36.0)
MCV: 95.5 fL (ref 80.0–100.0)
Monocytes Absolute: 0.6 10*3/uL (ref 0.1–1.0)
Monocytes Relative: 3 %
Neutro Abs: 20.7 10*3/uL — ABNORMAL HIGH (ref 1.7–7.7)
Neutrophils Relative %: 94 %
Platelets: 447 10*3/uL — ABNORMAL HIGH (ref 150–400)
RBC: 2.66 MIL/uL — ABNORMAL LOW (ref 3.87–5.11)
RDW: 15.7 % — ABNORMAL HIGH (ref 11.5–15.5)
WBC: 22 10*3/uL — ABNORMAL HIGH (ref 4.0–10.5)
nRBC: 0.2 % (ref 0.0–0.2)

## 2019-10-11 LAB — BASIC METABOLIC PANEL
Anion gap: 13 (ref 5–15)
BUN: 60 mg/dL — ABNORMAL HIGH (ref 8–23)
CO2: 21 mmol/L — ABNORMAL LOW (ref 22–32)
Calcium: 7.8 mg/dL — ABNORMAL LOW (ref 8.9–10.3)
Chloride: 108 mmol/L (ref 98–111)
Creatinine, Ser: 2.03 mg/dL — ABNORMAL HIGH (ref 0.44–1.00)
GFR calc Af Amer: 26 mL/min — ABNORMAL LOW (ref >60)
GFR calc non Af Amer: 22 mL/min — ABNORMAL LOW (ref >60)
Glucose, Bld: 148 mg/dL — ABNORMAL HIGH (ref 70–99)
Potassium: 3.8 mmol/L (ref 3.5–5.1)
Sodium: 142 mmol/L (ref 135–145)

## 2019-10-11 LAB — GLUCOSE, CAPILLARY: Glucose-Capillary: 175 mg/dL — ABNORMAL HIGH (ref 70–99)

## 2019-10-11 LAB — KAPPA/LAMBDA LIGHT CHAINS
Kappa free light chain: 85.7 mg/L — ABNORMAL HIGH (ref 3.3–19.4)
Kappa, lambda light chain ratio: 2.05 — ABNORMAL HIGH (ref 0.26–1.65)
Lambda free light chains: 41.9 mg/L — ABNORMAL HIGH (ref 5.7–26.3)

## 2019-10-11 LAB — PHOSPHORUS: Phosphorus: 4.7 mg/dL — ABNORMAL HIGH (ref 2.5–4.6)

## 2019-10-11 LAB — PARATHYROID HORMONE, INTACT (NO CA): PTH: 31 pg/mL (ref 15–65)

## 2019-10-11 MED ORDER — MORPHINE SULFATE (PF) 2 MG/ML IV SOLN
1.0000 mg | INTRAVENOUS | Status: DC | PRN
  Filled 2019-10-11: qty 1

## 2019-10-11 MED ORDER — MORPHINE SULFATE (PF) 2 MG/ML IV SOLN
2.0000 mg | INTRAVENOUS | Status: DC | PRN
  Administered 2019-10-11 (×3): 2 mg via INTRAVENOUS
  Filled 2019-10-11 (×2): qty 1

## 2019-10-11 MED ORDER — HYDROMORPHONE BOLUS VIA INFUSION
0.5000 mg | INTRAVENOUS | Status: DC | PRN
  Administered 2019-10-11: 1 mg via INTRAVENOUS
  Filled 2019-10-11: qty 1

## 2019-10-11 MED ORDER — ACETAMINOPHEN 650 MG RE SUPP: 650.0000 mg | Freq: Four times a day (QID) | RECTAL | Status: DC | PRN

## 2019-10-11 MED ORDER — LORAZEPAM 2 MG/ML IJ SOLN
0.5000 mg | INTRAMUSCULAR | Status: DC | PRN
  Administered 2019-10-11: 0.5 mg via INTRAVENOUS
  Filled 2019-10-11: qty 1

## 2019-10-11 MED ORDER — SODIUM CHLORIDE 0.9 % IV SOLN
0.5000 mg/h | INTRAVENOUS | Status: DC
  Administered 2019-10-11: 0.5 mg/h via INTRAVENOUS
  Filled 2019-10-11: qty 2.5

## 2019-10-11 MED ORDER — GLYCOPYRROLATE 0.2 MG/ML IJ SOLN
0.2000 mg | INTRAMUSCULAR | Status: DC | PRN
  Filled 2019-10-11 (×2): qty 1

## 2019-10-11 MED ORDER — MORPHINE SULFATE (PF) 2 MG/ML IV SOLN
2.0000 mg | Freq: Once | INTRAVENOUS | Status: AC
  Administered 2019-10-11: 2 mg via INTRAVENOUS
  Filled 2019-10-11: qty 1

## 2019-10-11 MED ORDER — FUROSEMIDE 40 MG PO TABS: 20.0000 mg | ORAL_TABLET | Freq: Every day | ORAL | Status: DC

## 2019-10-11 MED ORDER — BISACODYL 10 MG RE SUPP
10.0000 mg | Freq: Every day | RECTAL | Status: DC | PRN
  Filled 2019-10-11: qty 1

## 2019-10-11 MED ORDER — MORPHINE SULFATE (PF) 4 MG/ML IV SOLN
4.0000 mg | INTRAVENOUS | Status: AC
  Administered 2019-10-11: 4 mg via INTRAVENOUS
  Filled 2019-10-11: qty 1

## 2019-10-14 LAB — CULTURE, BLOOD (ROUTINE X 2)
Culture: NO GROWTH
Culture: NO GROWTH
Special Requests: ADEQUATE
Special Requests: ADEQUATE

## 2019-10-30 ENCOUNTER — Encounter (HOSPITAL_COMMUNITY): Payer: Medicare Other | Admitting: Internal Medicine

## 2019-11-01 NOTE — Progress Notes (Signed)
Patient unable to take oral medications at this time due to BiPAP. Will cancel PO Lasix scheduled to start tomorrow and reassess at that time.

## 2019-11-01 NOTE — Death Summary Note (Signed)
DEATH SUMMARY   Patient Details  Name: Wanda Brooks MRN: [id number] DOB: 16-Feb-1936  Admission/Discharge Information   Admit Date:  11-05-19  Date of Death:    Time of Death:    Length of Stay: 2  Referring Physician: Jearld Fenton, NP   Reason(s) for Hospitalization  Hypoxic respiratory failure  Diagnoses  Preliminary cause of death:  Secondary Diagnoses (including complications and co-morbidities):  Principal Problem:   Acute on chronic combined systolic (congestive) and diastolic (congestive) heart failure ([place]) Active Problems:   Chronic atrial fibrillation (HCC)   Type 2 diabetes mellitus with stage 4 chronic kidney disease ([place])   Essential hypertension   Coronary artery disease involving coronary bypass graft of native heart without angina pectoris   COPD (chronic obstructive pulmonary disease) (HCC)   Anxiety and depression   History of anemia due to chronic kidney disease   Dyspnea   Respiratory failure, acute (HCC)   Acute respiratory failure ([place])   Terminal care   Brief Hospital Course (including significant findings, care, treatment, and services provided and events leading to death)  Wanda Brooks is a 84 y.o. year old female who was with extensive medical issues and cardiovascular problems including COPD, stage IV chronic kidney disease with baseline creatinine about 1.7, paroxysmal A. fib, smoker, chronic congestive heart failure, coronary artery disease status post CABG, sick sinus syndrome status post pacemaker who presented to the emergency room with progressive shortness of breath for about 2 weeks with symptom onset more than a month.  At the emergency room, she was hypoxic 69% on room air, treated with BiPAP and high flow nasal cannula oxygen.  Chest x-ray and CT scan was consistent with fluid overload as well as multifocal airspace opacities.  She was admitted with broad-spectrum antibiotics, bronchodilator therapy, oxygen support and also seen and  followed by cardiology nephrology to treat for congestive heart failure exacerbation.  While on treatment, patient did not respond well, she continued to have worsening hypoxia and requiring very high flow oxygen as well as started developing agitation and confusion.  As per patient's wishes, she was converted into comfort care measures and was provided with hospice level of care while in the hospital and ultimately died with family at the bedside on 07-Nov-2019 at 1910.    Pertinent Labs and Studies  Significant Diagnostic Studies DG Elbow Complete Right  Result Date: 10/03/2019 CLINICAL DATA:  Fall. EXAM: RIGHT ELBOW - COMPLETE 3+ VIEW COMPARISON:  No recent. FINDINGS: Diffuse soft tissue swelling. Probable right elbow joint effusion. Diffuse osteopenia and degenerative change. No definite fracture. No dislocation. IMPRESSION: Diffuse soft tissue swelling. Probable right elbow joint effusion. No definite fracture. No dislocation. Follow-up imaging in 7-10 days may prove useful for continued evaluation Electronically Signed   By: [place]   On: 10/03/2019 12:40   CT HEAD WO CONTRAST  Result Date: 11/05/2019 CLINICAL DATA:  Altered mental status. EXAM: CT HEAD WITHOUT CONTRAST TECHNIQUE: Contiguous axial images were obtained from the base of the skull through the vertex without intravenous contrast. COMPARISON:  None. FINDINGS: Brain: No hemorrhage. No extraaxial collection.No midline shift. There is atrophy.The basal cisterns are unremarkable. Patchy and confluent areas of decreased attenuation are noted throughout the deep and periventricular white matter of the cerebral hemispheres bilaterally, compatible with chronic microvascular ischemic disease. There are areas of encephalomalacia involving the bilateral occipital lobes. The brainstem is unremarkable. The cerebellum is unremarkable. The sella is unremarkable. Vascular: No hyperdense vessel or unexpected calcification. Skull:  The calvarium is  unremarkable. The skull base is unremarkable. The visualized upper cervical spine is unremarkable. Sinuses/Orbits: There is opacification of the sphenoid sinuses. The remaining paranasal sinuses are unremarkable. The mastoid air cells are clear. Other: The visualized parotid gland is unremarkable. There is no scalp soft tissue swelling. IMPRESSION: 1. No acute intracranial abnormality. 2. Atrophy and advanced chronic microvascular ischemic disease. 3. Remote bilateral occipital lobe infarcts. 4. Sphenoid sinus disease. Electronically Signed   By: Constance Holster M.D.   On: 10/18/2019 16:21   CT CHEST WO CONTRAST  Result Date: 10/07/2019 CLINICAL DATA:  Possible pneumonia, effusion or pulmonary abscess suspected. Shortness of breath. Oxygen dependent with COPD. EXAM: CT CHEST WITHOUT CONTRAST TECHNIQUE: Multidetector CT imaging of the chest was performed following the standard protocol without IV contrast. COMPARISON:  Chest x-ray today and 04/11/2019. Lumbar spine series 04/11/2019. FINDINGS: Cardiovascular: Left-sided cardiac pacemaker intact. Previous median sternotomy. Mild cardiomegaly. Calcified plaque over the coronary arteries. Moderate calcified plaque throughout the thoracic aorta. Calcified plaque over the common carotid arteries and subclavian arteries. Remaining vascular structures are unremarkable. Mediastinum/Nodes: Multiple shotty mediastinal lymph nodes likely reactive. No definite hilar adenopathy. Remaining mediastinal structures are unremarkable. Lungs/Pleura: Lungs are adequately inflated demonstrate a patchy bilateral airspace process with hazy attenuation mild associated interstitial prominence likely multifocal pneumonia. There is a moderate size right effusion and small left effusion. Central airways are within normal. Upper Abdomen: Moderate calcified plaque over the abdominal aorta. 4 cm cyst over the right kidney with smaller cyst anteriorly. No acute findings in the upper abdomen.  Musculoskeletal: Known moderate L1 compression fracture. Mild depression of the superior endplate of T4. IMPRESSION: 1. Bilateral multifocal airspace process likely multifocal pneumonia. Moderate associated right effusion and small left effusion. 2. Mild cardiomegaly. Atherosclerotic coronary artery disease. Left-sided pacemaker. 3.  Aortic Atherosclerosis (ICD10-I70.0). 4.  Left renal cysts. 4. Stable moderate L1 compression fracture. Minimal depression of midportion of T4 superior endplate. Electronically Signed   By: Marin Olp M.D.   On: 10/08/2019 09:32   US RENAL  Result Date: 10/20/2019 CLINICAL DATA:  Chronic renal disease. EXAM: RENAL / URINARY TRACT ULTRASOUND COMPLETE COMPARISON:  Renal ultrasound 01/02/2016. FINDINGS: Right Kidney: Renal measurements: 9.6 x 4.4 x 5.2 cm = volume: 115 mL . Echogenicity is increased. No mass or hydronephrosis visualized. Left Kidney: Renal measurements: 9.1 x 5.0 x 4.7 cm = volume: 110 mL. Echogenicity is increased. No solid mass or hydronephrosis visualized. Three simple cysts are identified. The largest is in upper pole and measures 3.7 cm in diameter. Bladder: Appears normal for degree of bladder distention. Other: None. IMPRESSION: Negative for hydronephrosis or other acute abnormality. Increased cortical echogenicity suggestive of medical renal disease. Three simple left renal cysts. Electronically Signed   By: Inge Rise M.D.   On: 10/05/2019 12:31   DG Chest Port 1 View  Result Date: 11-09-2019 CLINICAL DATA:  Hypoxia, respiratory distress EXAM: PORTABLE CHEST 1 VIEW COMPARISON:  10/22/2019 FINDINGS: Multifocal patchy opacities/pneumonia, right upper lobe dominant. Suspected small bilateral pleural effusions. No pneumothorax. The heart is normal in size. Thoracic aortic atherosclerosis. Left subclavian pacemaker. Median sternotomy. IMPRESSION: Multifocal pneumonia, right upper lobe predominant, mildly progressive. Suspected small bilateral pleural  effusions. Electronically Signed   By: Julian Hy M.D.   On: 09-Nov-2019 10:59   DG Chest Portable 1 View  Result Date: 10/21/2019 CLINICAL DATA:  Shortness of breath.  Respiratory distress. EXAM: PORTABLE CHEST 1 VIEW COMPARISON:  Two-view chest x-ray 04/11/2019 FINDINGS: The heart is  enlarged. Atherosclerotic changes are noted at the aortic arch. Pacing wires are stable. Diffuse interstitial and airspace opacities are present bilaterally. Bilateral effusions are present. No pneumothorax is present. IMPRESSION: Cardiomegaly with diffuse interstitial and airspace disease and bilateral effusions. Findings are consistent with congestive heart failure. Multi lobar pneumonia is also considered. Electronically Signed   By: San Morelle M.D.   On: 10/30/2019 06:42   ECHOCARDIOGRAM COMPLETE  Result Date: 10/30/2019    ECHOCARDIOGRAM REPORT   Patient Name:   Wanda Brooks Date of Exam: 10/30/2019 Medical Rec #:  [id number]     Height:       64.0 in Accession #:    [id number]    Weight:       122.6 lb Date of Birth:  11/13/1935     BSA:          1.589 m Patient Age:    28 years      BP:           163/58 mmHg Patient Gender: F             HR:           60 bpm. Exam Location:  [facility] Procedure: 2D Echo, Cardiac Doppler and Color Doppler Indications:     CHF- acute systolic 616.07  History:         Patient has prior history of Echocardiogram examinations, most                  recent 05/04/2018. CHF, CAD, Pacemaker, COPD, Arrythmias:Atrial                  Fibrillation; Risk Factors:Hypertension and Diabetes.  Sonographer:     Sherrie Sport RDCS (AE) Referring Phys:  [id number] Collier Bullock Diagnosing Phys: Serafina Royals MD  Sonographer Comments: Suboptimal apical window. IMPRESSIONS  1. Left ventricular ejection fraction, by estimation, is 45 to 50%. The left ventricle has mildly decreased function. The left ventricle demonstrates regional wall motion abnormalities (see scoring diagram/findings for description). Left  ventricular diastolic parameters were normal.  2. Right ventricular systolic function is normal. The right ventricular size is normal. There is severely elevated pulmonary artery systolic pressure.  3. Left atrial size was mild to moderately dilated.  4. Right atrial size was mildly dilated.  5. The mitral valve is normal in structure. Moderate mitral valve regurgitation.  6. The aortic valve is normal in structure. Aortic valve regurgitation is trivial. FINDINGS  Left Ventricle: Left ventricular ejection fraction, by estimation, is 45 to 50%. The left ventricle has mildly decreased function. The left ventricle demonstrates regional wall motion abnormalities. Moderate hypokinesis of the left ventricular, entire septal wall. The left ventricular internal cavity size was normal in size. There is no left ventricular hypertrophy. Left ventricular diastolic parameters were normal. Right Ventricle: The right ventricular size is normal. No increase in right ventricular wall thickness. Right ventricular systolic function is normal. There is severely elevated pulmonary artery systolic pressure. The tricuspid regurgitant velocity is 3.66 m/s, and with an assumed right atrial pressure of 10 mmHg, the estimated right ventricular systolic pressure is 69.4 mmHg. Left Atrium: Left atrial size was mild to moderately dilated. Right Atrium: Right atrial size was mildly dilated. Pericardium: There is no evidence of pericardial effusion. Mitral Valve: The mitral valve is normal in structure. Moderate mitral valve regurgitation. Tricuspid Valve: The tricuspid valve is normal in structure. Tricuspid valve regurgitation is mild. Aortic Valve: The aortic valve is normal in structure. Aortic  valve regurgitation is trivial. Aortic valve mean gradient measures 3.0 mmHg. Aortic valve peak gradient measures 4.9 mmHg. Aortic valve area, by VTI measures 2.39 cm. Pulmonic Valve: The pulmonic valve was normal in structure. Pulmonic valve  regurgitation is not visualized. Aorta: The aortic root and ascending aorta are structurally normal, with no evidence of dilitation. IAS/Shunts: No atrial level shunt detected by color flow Doppler.  LEFT VENTRICLE PLAX 2D LVIDd:         4.93 cm  Diastology LVIDs:         3.79 cm  LV e' lateral:   5.98 cm/s LV PW:         1.60 cm  LV E/e' lateral: 17.2 LV IVS:        1.44 cm  LV e' medial:    5.00 cm/s LVOT diam:     2.00 cm  LV E/e' medial:  20.6 LV SV:         52 LV SV Index:   32 LVOT Area:     3.14 cm  RIGHT VENTRICLE RV Basal diam:  3.80 cm RV S prime:     7.62 cm/s TAPSE (M-mode): 2.4 cm LEFT ATRIUM             Index       RIGHT ATRIUM           Index LA diam:        5.50 cm 3.46 cm/m  RA Area:     22.10 cm LA Vol (A2C):   59.2 ml 37.26 ml/m RA Volume:   66.60 ml  41.92 ml/m LA Vol (A4C):   66.8 ml 42.04 ml/m LA Biplane Vol: 64.8 ml 40.78 ml/m  AORTIC VALVE AV Area (Vmax):    1.76 cm AV Area (Vmean):   1.58 cm AV Area (VTI):     2.39 cm AV Vmax:           110.67 cm/s AV Vmean:          75.200 cm/s AV VTI:            0.216 m AV Peak Grad:      4.9 mmHg AV Mean Grad:      3.0 mmHg LVOT Vmax:         62.10 cm/s LVOT Vmean:        37.800 cm/s LVOT VTI:          0.164 m LVOT/AV VTI ratio: 0.76  AORTA Ao Root diam: 2.80 cm MITRAL VALVE                TRICUSPID VALVE MV Area (PHT): 3.42 cm     TR Peak grad:   53.6 mmHg MV Decel Time: 222 msec     TR Vmax:        366.00 cm/s MV E velocity: 103.00 cm/s MV A velocity: 50.00 cm/s   SHUNTS MV E/A ratio:  2.06         Systemic VTI:  0.16 m                             Systemic Diam: 2.00 cm Serafina Royals MD Electronically signed by Serafina Royals MD Signature Date/Time: 10/21/2019/3:35:33 PM    Final     Microbiology Recent Results (from the past 240 hour(s))  Blood culture (routine x 2)     Status: None (Preliminary result)   Collection Time: 10/31/2019  5:34 AM   Specimen:  BLOOD  Result Value Ref Range Status   Specimen Description BLOOD BLOOD LEFT WRIST   Final   Special Requests   Final    BOTTLES DRAWN AEROBIC AND ANAEROBIC Blood Culture adequate volume   Culture   Final    NO GROWTH 3 DAYS Performed at [facility], [address] [place], [place] [address]    Report Status PENDING  Incomplete  Blood culture (routine x 2)     Status: None (Preliminary result)   Collection Time: 10/17/2019  5:34 AM   Specimen: BLOOD  Result Value Ref Range Status   Specimen Description BLOOD LEFT ANTECUBITAL  Final   Special Requests   Final    BOTTLES DRAWN AEROBIC AND ANAEROBIC Blood Culture adequate volume   Culture   Final    NO GROWTH 3 DAYS Performed at [facility], [address] [place], [place] [address]    Report Status PENDING  Incomplete  SARS Coronavirus 2 by RT PCR (hospital order, performed in [place] hospital lab) Nasopharyngeal Nasopharyngeal Swab     Status: None   Collection Time: 10/27/2019  5:35 AM   Specimen: Nasopharyngeal Swab  Result Value Ref Range Status   SARS Coronavirus 2 NEGATIVE NEGATIVE Final    Comment: (NOTE) SARS-CoV-2 target nucleic acids are NOT DETECTED.  The SARS-CoV-2 RNA is generally detectable in upper and lower respiratory specimens during the acute phase of infection. The lowest concentration of SARS-CoV-2 viral copies this assay can detect is 250 copies / mL. A negative result does not preclude SARS-CoV-2 infection and should not be used as the sole basis for treatment or other patient management decisions.  A negative result may occur with improper specimen collection / handling, submission of specimen other than nasopharyngeal swab, presence of viral mutation(s) within the areas targeted by this assay, and inadequate number of viral copies (<250 copies / mL). A negative result must be combined with clinical observations, patient history, and epidemiological information.  Fact Sheet for Patients:   StrictlyIdeas.no  Fact Sheet for Healthcare  Providers: BankingDealers.co.za  This test is not yet approved or  cleared by the [place] FDA and has been authorized for detection and/or diagnosis of SARS-CoV-2 by FDA under an Emergency Use Authorization (EUA).  This EUA will remain in effect (meaning this test can be used) for the duration of the COVID-19 declaration under Section 564(b)(1) of the Act, 21 U.S.C. section 360bbb-3(b)(1), unless the authorization is terminated or revoked sooner.  Performed at [facility], [address] [place], [place] [address]     Lab Basic Metabolic Panel: Recent Labs  Lab 10/17/2019 0535 10/08/2019 1939 10/10/19 0610 Nov 02, 2019 1016  NA 143  --  143 142  K 4.6  --  4.0 3.8  CL 112*  --  111 108  CO2 19*  --  19* 21*  GLUCOSE 182*  --  120* 148*  BUN 48*  --  57* 60*  CREATININE 1.91*  --  1.91* 2.03*  CALCIUM 8.8*  --  8.3* 7.8*  PHOS  --  4.8*  --  4.7*   Liver Function Tests: Recent Labs  Lab 10/01/2019 0535  AST 24  ALT 13  ALKPHOS 58  BILITOT 0.9  PROT 7.5  ALBUMIN 3.1*   No results for input(s): LIPASE, AMYLASE in the last 168 hours. No results for input(s): AMMONIA in the last 168 hours. CBC: Recent Labs  Lab 10/16/2019 0535 10/10/19 0815 11/02/2019 0516  WBC 21.6* 19.9*  22.0*  NEUTROABS 17.9* 17.3* 20.7*  HGB 9.9* 8.1* 8.1*  HCT 30.4* 24.9* 25.4*  MCV 92.4 94.7 95.5  PLT 550* 452* 447*   Cardiac Enzymes: No results for input(s): CKTOTAL, CKMB, CKMBINDEX, TROPONINI in the last 168 hours. Sepsis Labs: Recent Labs  Lab 10/03/2019 0535 10/10/19 0815 2019-10-14 0516  WBC 21.6* 19.9* 22.0*    Procedures/Operations  None   Ilaria Much 10/12/2019, 2:40 PM

## 2019-11-01 NOTE — ED Notes (Signed)
Pt oxygen saturation continuing to decline while on BiPap. Hospitalist and NP aware and considering comfort care for pt. Daughter on board with plan.

## 2019-11-01 NOTE — Progress Notes (Signed)
OT Cancellation Note  Patient Details Name: Wanda Brooks MRN: [id number] DOB: 1935/06/11   Cancelled Treatment:    Reason Eval/Treat Not Completed: Other (comment). Acknowledge cancellation of OT orders by palliative. It was a pleasure participating in this patient's care. Please re-consult if GOC change. Thank you.   Shara Blazing, M.S., OTR/L Ascom: [phone] 15-Oct-2019, 12:37 PM

## 2019-11-01 NOTE — Progress Notes (Signed)
PROGRESS NOTE    Wanda Brooks  JJH:[id number] DOB: [birth year] DOA: 10/07/2019 PCP: Jearld Fenton, NP    Brief Narrative:  84 year old female with extensive medical issues and cardiovascular problems including COPD, stage IV chronic kidney disease baseline creatinine about 1.7, A. fib, smoker, chronic congestive heart failure, coronary artery disease status post CABG, permanent pacemaker who presents to the emergency room with progressive shortness of breath for about 2 weeks. At the emergency room hypoxic with 69% on room air, placed on BiPAP.  Chest x-ray showed CHF with multifocal pneumonia.  CT scan of the chest consistent with fluid overload as well as multifocal airspace opacities.  Admitted with acute respiratory failure due to multiple issues.  8/11, patient continues to be doing poorly, not responding well, on 80% FiO2.  Repeat chest x-ray with extensive ARDS-like picture progressive opacities than yesterday. Nearing end-of-life, started on comfort care measures.   Assessment & Plan:   Principal Problem:   Acute on chronic combined systolic (congestive) and diastolic (congestive) heart failure (HCC) Active Problems:   Chronic atrial fibrillation (HCC)   Type 2 diabetes mellitus with stage 4 chronic kidney disease (HCC)   Essential hypertension   Coronary artery disease involving coronary bypass graft of native heart without angina pectoris   COPD (chronic obstructive pulmonary disease) (HCC)   Anxiety and depression   History of anemia due to chronic kidney disease   Dyspnea   Respiratory failure, acute (HCC)   Acute respiratory failure (HCC)   Terminal care   Acute hypoxic respiratory failure, severe hypoxia, agitation and restlessness Acute on chronic combined heart failure Chronic atrial fibrillation on anticoagulation Sick sinus syndrome status post permanent pacemaker Advanced debility  End-of-life  Plan:  Patient continues to do poorly and currently suffering  with respiratory distress and untreatable or difficult to treat conditions.  She has wishes to stay comfortable and die peacefully. Detailed discussion with patient's daughter at the bedside who is her healthcare power of attorney. Followed by palliative care. Due to patient's wishes to stay comfortable at her terminal stage, she will be provided with comfort care and hospice level of care in the hospital. Patient is anticipated to die soon in the hospital, not stable enough to transition to inpatient hospice. All symptom control medications available, not responding to intermittent morphine, will start on Dilaudid infusion. RN to pronounce death if happens in the hospital. Unrestricted visitor access.    DVT prophylaxis: Comfort care   Code Status: Comfort care Family Communication: Patient's daughter, [place] Poot at bedside Disposition Plan: Status is: Inpatient  Remains inpatient appropriate because:IV treatments appropriate due to intensity of illness or inability to take PO and Inpatient level of care appropriate due to severity of illness   Dispo: The patient is from: Home              Anticipated d/c is to: hospital death               Anticipated d/c date is: unknown               Patient currently is not medically stable to d/c.    Consultants:   Cardiology  Nephrology  Palliative  Procedures:   None  Antimicrobials:  Antibiotics Given (last 72 hours)    Date/Time Action Medication Dose Rate   10/01/2019 0810 New Bag/Given   cefTRIAXone (ROCEPHIN) 2 g in sodium chloride 0.9 % 100 mL IVPB 2 g 200 mL/hr   10/15/2019 0919 New Bag/Given  azithromycin (ZITHROMAX) 500 mg in sodium chloride 0.9 % 250 mL IVPB 500 mg 250 mL/hr   10/10/19 0900 New Bag/Given   cefTRIAXone (ROCEPHIN) 1 g in sodium chloride 0.9 % 100 mL IVPB 1 g 200 mL/hr   10/10/19 0902 New Bag/Given   azithromycin (ZITHROMAX) 500 mg in sodium chloride 0.9 % 250 mL IVPB 500 mg 250 mL/hr   10/29/19 0848  New Bag/Given   azithromycin (ZITHROMAX) 500 mg in sodium chloride 0.9 % 250 mL IVPB 500 mg 250 mL/hr         Subjective: Patient seen and examined multiple times today.  Overnight she developed respiratory distress and started on BiPAP.  She was on 80% FiO2 through BiPAP, was fidgety and uncomfortable.  Daughter at the bedside. Ordered a repeat chest x-ray and reviewed at the bedside that shows extensive bilateral multifocal infiltrate, poor aeration of the lungs and ARDS-like picture. Due to patient's poor recovery and her wishes to stay comfortable, comfort care measures were started.  Objective: Vitals:   10/29/19 0730 Oct 29, 2019 0906 2019-10-29 1230 2019/10/29 1445  BP: (!) 142/48  (!) 136/28   Pulse: (!) 59  (!) 59 (!) 58  Resp: (!) 22  (!) 42 (!) 38  Temp:      TempSrc:      SpO2: 92% 97%  (!) 63%  Weight:      Height:       No intake or output data in the 24 hours ending 10-29-19 1452 Filed Weights   10/03/2019 0529  Weight: 55.6 kg    Examination:Physical Exam Constitutional:      Comments: Sick appearing and frail looking elderly lady on very high oxygen, fidgety and uncomfortable.  Eyes:     Pupils: Pupils are equal, round, and reactive to light.  Cardiovascular:     Comments: Tachycardic.  Irregular.  Pacemaker present. Pulmonary:     Comments: In moderate distress, tachypneic, poor bilateral air entry and conducted airway sounds. Neurological:     Comments: Confused, disoriented, impulsive.       Data Reviewed: I have personally reviewed following labs and imaging studies  CBC: Recent Labs  Lab 10/20/2019 0535 10/10/19 0815 10/29/2019 0516  WBC 21.6* 19.9* 22.0*  NEUTROABS 17.9* 17.3* 20.7*  HGB 9.9* 8.1* 8.1*  HCT 30.4* 24.9* 25.4*  MCV 92.4 94.7 95.5  PLT 550* 452* 322*   Basic Metabolic Panel: Recent Labs  Lab 10/12/2019 0535 10/21/2019 1939 10/10/19 0610 2019/10/29 1016  NA 143  --  143 142  K 4.6  --  4.0 3.8  CL 112*  --  111 108  CO2 19*  --   19* 21*  GLUCOSE 182*  --  120* 148*  BUN 48*  --  57* 60*  CREATININE 1.91*  --  1.91* 2.03*  CALCIUM 8.8*  --  8.3* 7.8*  PHOS  --  4.8*  --  4.7*   GFR: Estimated Creatinine Clearance: 18.8 mL/min (A) (by C-G formula based on SCr of 2.03 mg/dL (H)). Liver Function Tests: Recent Labs  Lab 10/26/2019 0535  AST 24  ALT 13  ALKPHOS 58  BILITOT 0.9  PROT 7.5  ALBUMIN 3.1*   No results for input(s): LIPASE, AMYLASE in the last 168 hours. No results for input(s): AMMONIA in the last 168 hours. Coagulation Profile: No results for input(s): INR, PROTIME in the last 168 hours. Cardiac Enzymes: No results for input(s): CKTOTAL, CKMB, CKMBINDEX, TROPONINI in the last 168 hours. BNP (last 3 results) No  results for input(s): PROBNP in the last 8760 hours. HbA1C: Recent Labs    10/10/19 0610  HGBA1C 6.1*   CBG: Recent Labs  Lab 10/10/19 0756 10/10/19 1232 10/10/19 1858 10/10/19 2256 10-23-19 0801  GLUCAP 119* 124* 108* 146* 175*   Lipid Profile: No results for input(s): CHOL, HDL, LDLCALC, TRIG, CHOLHDL, LDLDIRECT in the last 72 hours. Thyroid Function Tests: No results for input(s): TSH, T4TOTAL, FREET4, T3FREE, THYROIDAB in the last 72 hours. Anemia Panel: Recent Labs    10/20/2019 1939  VITAMINB12 176*  FOLATE 9.8  FERRITIN 214  TIBC 209*  IRON 21*   Sepsis Labs: No results for input(s): PROCALCITON, LATICACIDVEN in the last 168 hours.  Recent Results (from the past 240 hour(s))  Blood culture (routine x 2)     Status: None (Preliminary result)   Collection Time: 10/10/2019  5:34 AM   Specimen: BLOOD  Result Value Ref Range Status   Specimen Description BLOOD BLOOD LEFT WRIST  Final   Special Requests   Final    BOTTLES DRAWN AEROBIC AND ANAEROBIC Blood Culture adequate volume   Culture   Final    NO GROWTH 2 DAYS Performed at [facility], [address] [place], [place] [address]    Report Status PENDING  Incomplete  Blood culture (routine x 2)      Status: None (Preliminary result)   Collection Time: 10/20/2019  5:34 AM   Specimen: BLOOD  Result Value Ref Range Status   Specimen Description BLOOD LEFT ANTECUBITAL  Final   Special Requests   Final    BOTTLES DRAWN AEROBIC AND ANAEROBIC Blood Culture adequate volume   Culture   Final    NO GROWTH 2 DAYS Performed at [facility], [address] [place], [place] [address]    Report Status PENDING  Incomplete  SARS Coronavirus 2 by RT PCR (hospital order, performed in [place] hospital lab) Nasopharyngeal Nasopharyngeal Swab     Status: None   Collection Time: 10/12/2019  5:35 AM   Specimen: Nasopharyngeal Swab  Result Value Ref Range Status   SARS Coronavirus 2 NEGATIVE NEGATIVE Final    Comment: (NOTE) SARS-CoV-2 target nucleic acids are NOT DETECTED.  The SARS-CoV-2 RNA is generally detectable in upper and lower respiratory specimens during the acute phase of infection. The lowest concentration of SARS-CoV-2 viral copies this assay can detect is 250 copies / mL. A negative result does not preclude SARS-CoV-2 infection and should not be used as the sole basis for treatment or other patient management decisions.  A negative result may occur with improper specimen collection / handling, submission of specimen other than nasopharyngeal swab, presence of viral mutation(s) within the areas targeted by this assay, and inadequate number of viral copies (<250 copies / mL). A negative result must be combined with clinical observations, patient history, and epidemiological information.  Fact Sheet for Patients:   StrictlyIdeas.no  Fact Sheet for Healthcare Providers: BankingDealers.co.za  This test is not yet approved or  cleared by the [place] FDA and has been authorized for detection and/or diagnosis of SARS-CoV-2 by FDA under an Emergency Use Authorization (EUA).  This EUA will remain in effect (meaning this test  can be used) for the duration of the COVID-19 declaration under Section 564(b)(1) of the Act, 21 U.S.C. section 360bbb-3(b)(1), unless the authorization is terminated or revoked sooner.  Performed at [facility], [address] [place], [place] [address]  Radiology Studies: CT HEAD WO CONTRAST  Result Date: 10/31/2019 CLINICAL DATA:  Altered mental status. EXAM: CT HEAD WITHOUT CONTRAST TECHNIQUE: Contiguous axial images were obtained from the base of the skull through the vertex without intravenous contrast. COMPARISON:  None. FINDINGS: Brain: No hemorrhage. No extraaxial collection.No midline shift. There is atrophy.The basal cisterns are unremarkable. Patchy and confluent areas of decreased attenuation are noted throughout the deep and periventricular white matter of the cerebral hemispheres bilaterally, compatible with chronic microvascular ischemic disease. There are areas of encephalomalacia involving the bilateral occipital lobes. The brainstem is unremarkable. The cerebellum is unremarkable. The sella is unremarkable. Vascular: No hyperdense vessel or unexpected calcification. Skull: The calvarium is unremarkable. The skull base is unremarkable. The visualized upper cervical spine is unremarkable. Sinuses/Orbits: There is opacification of the sphenoid sinuses. The remaining paranasal sinuses are unremarkable. The mastoid air cells are clear. Other: The visualized parotid gland is unremarkable. There is no scalp soft tissue swelling. IMPRESSION: 1. No acute intracranial abnormality. 2. Atrophy and advanced chronic microvascular ischemic disease. 3. Remote bilateral occipital lobe infarcts. 4. Sphenoid sinus disease. Electronically Signed   By: Constance Holster M.D.   On: 10/15/2019 16:21   DG Chest Port 1 View  Result Date: 29-Oct-2019 CLINICAL DATA:  Hypoxia, respiratory distress EXAM: PORTABLE CHEST 1 VIEW COMPARISON:  10/02/2019 FINDINGS: Multifocal patchy  opacities/pneumonia, right upper lobe dominant. Suspected small bilateral pleural effusions. No pneumothorax. The heart is normal in size. Thoracic aortic atherosclerosis. Left subclavian pacemaker. Median sternotomy. IMPRESSION: Multifocal pneumonia, right upper lobe predominant, mildly progressive. Suspected small bilateral pleural effusions. Electronically Signed   By: Julian Hy M.D.   On: 10-29-2019 10:59        Scheduled Meds: . sodium chloride flush  3 mL Intravenous Q12H   Continuous Infusions: . sodium chloride    . HYDROmorphone       LOS: 2 days    Time spent: 30 minutes    Barb Merino, MD [facility] Pager [phone]

## 2019-11-01 NOTE — ED Notes (Signed)
Pt attempting to take off bipap. RN explained importance of leaving mask on to help pt breathing. Pt nods.

## 2019-11-01 NOTE — Care Plan (Signed)
Was asked by Dr. Juleen China to consult on Tayla Luft due to Acute on Chronic Hypoxic Respiratory Failure in the setting of Multifocal PNA, Acute Decompensated combined systolic & diastolic CHF, and COPD Exacerbation.  Pt has had progressive decline today, with worsening hypoxia despite BiPAP.  Pt is a DNR/DNI.  Upon arrival to assess pt in ED, pt has already been transitioned to [place] only, and is being placed on Dilaudid gtt.  Pt's daughter [name] is at bedside, and she confirms request for comfort measures only.       PCCM is available as needed for any Critical Care needs.  Will not consult at this time.     Darel Hong, AGACNP-BC [facility] Pager: [phone]

## 2019-11-01 NOTE — Progress Notes (Signed)
PT Cancellation Note  Patient Details Name: PRABHJOT MADDUX MRN: [id number] DOB: March 23, 1935   Cancelled Treatment:    Reason Eval/Treat Not Completed: Other (comment);Patient not medically ready (PT spoke with RN, pt currently on bipap. PT to re-attempt when pt is off bipap and more appropriate for exertional activity.)   Lieutenant Diego PT, DPT 8:30 AM,October 30, 2019

## 2019-11-01 NOTE — Progress Notes (Signed)
Progress Note  Patient Name: Wanda Brooks Date of Encounter: 2019/10/26  Primary Cardiologist: Caryl Comes  Subjective   Documented UOP of ~ 600 mL over the past 24 hours with no weights. Renal function is pending. HGB low, though stable at 8.1 with a baseline ~ 13. Occult blood is pending. BP in the 248G to 500B systolic predominantly. She has been placed back on BiPAP with O2 saturations in the low to mid 90s%. With removal of BiPAP, her saturations drop into the 50s%.   Inpatient Medications    Scheduled Meds:  amiodarone  200 mg Oral Daily   amLODipine  10 mg Oral Q breakfast   atorvastatin  20 mg Oral q1800   budesonide (PULMICORT) nebulizer solution  0.25 mg Nebulization BID   buPROPion  75 mg Oral BID   cholecalciferol  1,000 Units Oral Daily   cyanocobalamin  1,000 mcg Intramuscular Daily   docusate sodium  100 mg Oral BID   ferrous sulfate  325 mg Oral Q breakfast   furosemide  40 mg Intravenous Q12H   heparin  5,000 Units Subcutaneous Q8H   hydrALAZINE  25 mg Oral TID   insulin aspart  0-9 Units Subcutaneous TID WC   isosorbide mononitrate  120 mg Oral Daily   metoprolol succinate  100 mg Oral Daily   oxybutynin  10 mg Oral QHS   pantoprazole  40 mg Oral Daily   polyethylene glycol  17 g Oral Daily   potassium chloride SA  20 mEq Oral QODAY   sodium chloride flush  3 mL Intravenous Q12H   Continuous Infusions:  sodium chloride     azithromycin Stopped (10/10/19 1002)   PRN Meds: sodium chloride, acetaminophen, acetaminophen, fluticasone, HYDROcodone-acetaminophen, ipratropium-albuterol, LORazepam, ondansetron (ZOFRAN) IV, sodium chloride flush   Vital Signs    Vitals:   2019-10-26 0500 10/26/2019 0530 10-26-19 0630 10-26-2019 0730  BP: (!) 125/50 (!) 132/40 (!) 138/45 (!) 142/48  Pulse: 62  61 (!) 59  Resp:  (!) 25 20 (!) 22  Temp:      TempSrc:      SpO2: 91% 92% 90% 92%  Weight:      Height:        Intake/Output Summary (Last 24  hours) at Oct 26, 2019 0843 Last data filed at 10/10/2019 1059 Gross per 24 hour  Intake 222.12 ml  Output --  Net 222.12 ml   Filed Weights   10/03/2019 0529  Weight: 55.6 kg    Telemetry    paced - Personally Reviewed  ECG    No new tracings - Personally Reviewed  Physical Exam   GEN:  Elderly and frail-appearing, no acute distress.   Neck: JVD is not elevated. Cardiac: RRR, II/VI systolic murmur LSB, no rubs, or gallops.  Respiratory:  Expiratory wheezing noted with diminished breath sounds bilaterally and faint crackles. On BiPAP. Labored.  GI: Soft, nontender, non-distended.   MS: No edema; No deformity. Neuro:  Alert and oriented x 3; Nonfocal.  Psych: Normal affect.  Labs    Chemistry Recent Labs  Lab 10/08/2019 0535 10/10/19 0610  NA 143 143  K 4.6 4.0  CL 112* 111  CO2 19* 19*  GLUCOSE 182* 120*  BUN 48* 57*  CREATININE 1.91* 1.91*  CALCIUM 8.8* 8.3*  PROT 7.5  --   ALBUMIN 3.1*  --   AST 24  --   ALT 13  --   ALKPHOS 58  --   BILITOT 0.9  --  GFRNONAA 24* 24*  GFRAA 28* 28*  ANIONGAP 12 13     Hematology Recent Labs  Lab 10/24/2019 0535 10/10/19 0815 10-14-2019 0516  WBC 21.6* 19.9* 22.0*  RBC 3.29* 2.63* 2.66*  HGB 9.9* 8.1* 8.1*  HCT 30.4* 24.9* 25.4*  MCV 92.4 94.7 95.5  MCH 30.1 30.8 30.5  MCHC 32.6 32.5 31.9  RDW 15.3 15.2 15.7*  PLT 550* 452* 447*    Cardiac EnzymesNo results for input(s): TROPONINI in the last 168 hours. No results for input(s): TROPIPOC in the last 168 hours.   BNP Recent Labs  Lab 10/03/2019 0900  BNP 1,852.6*     DDimer No results for input(s): DDIMER in the last 168 hours.   Radiology    CT HEAD WO CONTRAST  Result Date: 10/06/2019 IMPRESSION: 1. No acute intracranial abnormality. 2. Atrophy and advanced chronic microvascular ischemic disease. 3. Remote bilateral occipital lobe infarcts. 4. Sphenoid sinus disease. Electronically Signed   By: Constance Holster M.D.   On: 10/23/2019 16:21   CT CHEST WO  CONTRAST  Result Date: 10/22/2019 IMPRESSION: 1. Bilateral multifocal airspace process likely multifocal pneumonia. Moderate associated right effusion and small left effusion. 2. Mild cardiomegaly. Atherosclerotic coronary artery disease. Left-sided pacemaker. 3.  Aortic Atherosclerosis (ICD10-I70.0). 4.  Left renal cysts. 4. Stable moderate L1 compression fracture. Minimal depression of midportion of T4 superior endplate. Electronically Signed   By: Marin Olp M.D.   On: 10/28/2019 09:32   US RENAL  Result Date: 10/31/2019 IMPRESSION: Negative for hydronephrosis or other acute abnormality. Increased cortical echogenicity suggestive of medical renal disease. Three simple left renal cysts. Electronically Signed   By: Inge Rise M.D.   On: 10/22/2019 12:31   DG Chest Portable 1 View  Result Date: 10/18/2019 IMPRESSION: Cardiomegaly with diffuse interstitial and airspace disease and bilateral effusions. Findings are consistent with congestive heart failure. Multi lobar pneumonia is also considered. Electronically Signed   By: San Morelle M.D.   On: 10/03/2019 06:42    Cardiac Studies   LHC 2015: 50% left main, 60-70 % right coronary artery, severe proximal LAD disease and 80% second obtuse marginal.Patent CABG grafts. __________  2D echo 12/2015: LVEF 60-70%, Mod/Sev MR __________  2D echo 09/2016: LVEF 55-60%, Mod LVD, Mod MR, LAE __________  2D echo 06/2017: LVEF 35-40%, severe MR, LVH __________  2D echo 09/2017: LVEF 30-35%, Grade 2 DD, Mild AI, Mod/Sev MR, Mod LAE, PA peak pressure 34 mm Hg. __________  2D echo 10/26/2019 as read by outside cardiology group: 1. Left ventricular ejection fraction, by estimation, is 45 to 50%. The  left ventricle has mildly decreased function. The left ventricle  demonstrates regional wall motion abnormalities (see scoring  diagram/findings for description). Left ventricular  diastolic parameters were normal.  2. Right  ventricular systolic function is normal. The right ventricular  size is normal. There is severely elevated pulmonary artery systolic  pressure.  3. Left atrial size was mild to moderately dilated.  4. Right atrial size was mildly dilated.  5. The mitral valve is normal in structure. Moderate mitral valve  regurgitation.  6. The aortic valve is normal in structure. Aortic valve regurgitation is  trivial.   Patient Profile     84 y.o. 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 fib on Eliquis status post DCCV in 07/2017 with repeat DCCV in 09/2017, symptomatic bradycardia status post PPM in 2013 with BiV upgrade  with His lead placement in 10/2017 CKD stage IV, frequent falls with recurrent orthopedic injury, mitral regurgitation, remote CVA, DM2, HTN, HLD, and chronic hypoxic respiratory failure on supplemental oxygen secondary to COPD with ongoing tobacco use who is being seen today for the evaluation of acute on chronic combined systolic and diastolic CHF at the request of Dr. Francine Graven.  Assessment & Plan    1. Acute on chronic hypoxic respiratory failure: -She is now back on BiPAP to maintain O2 saturation in the low to mid 90s% -Multifactorial including multilobar pneumonia, COPD exacerbation, acute on chronic combined systolic and diastolic CHF, and progressive anemia -PE is less likely a contributing factor to her presentation given she is adequately anticoagulated with Eliquis, though with persistent hypoxia, could consider further evaluation if indicated by primary  -Pending respiratory status and clinical trend, if her dyspnea improves, though persists, could consider RHC, though at this time, she would not tolerate this from a respiratory status and it remains uncertain how/if this would change our management at this time -Supportive care and management as outlined below -Palliative care on board with  plans for home hospice   2.  Acute on chronic combined systolic and diastolic CHF secondary to ischemic cardiomyopathy/pulmonary hypertension: -Suspect she is reaching euvolemia and her persistent hypoxia is pulmonary in etiology  -Pending BMP, may need to transition from IV Lasix -Bilateral pleural effusions noted on CT of the chest with right being greater than left with BNP greater than 18,000 this admission -Echo as read by outside cardiology group reported as improved LV SF to 45 to 50% with prior echo indicating EF of 35 to 40% from 05/2018 -Echo this admission with elevated RVSP of 63.6 mmHg -Further recommendations on IV Lasix pending BMP today -Toprol-XL -Imdur/hydralazine -Not currently on ACE inhibitor/ARB/Entresto/MRA in the setting of CKD -Daily weights -Strict I's and O's  3.  CAD status post CABG with elevated troponin: -Initial high-sensitivity troponin mildly elevated at 40 with a delta of 56, continue to cycle until peak -No indication for heparin drip at this time without dynamic troponin elevation -Echo as outlined above -Previously on Eliquis in place of aspirin to minimize bleeding risk, though [place] has been held with down trending HGB -Toprol-XL -Atorvastatin  4.  Persistent A. fib: -Maintaining sinus rhythm -PTA amiodarone and Toprol-XL -CHA2DS2-VASc 9 (CHF, HTN, age x2, diabetes, remote CVA noted on CT imaging this admission, vascular disease, gender) -Her hemoglobin has trended down to 8.1 this morning, we will hold Eliquis given frequent falls and until her anemia is further evaluated  5.  Anemia: -HGB remains low, though stable at 8.1 with a baseline around 13  -Uncertain etiology -Work-up per internal medicine -Patient's daughter notes the patient has not noted any hematochezia or melena with stools being light in color more recently -Continue to hold Eliquis given downtrending hemoglobin as outlined above -With discontinuation of Eliquis she has been  placed on subcutaneous heparin for VTE PPx  6.  Acute on CKD stage IV: -BMP pending this morning -Baseline serum creatinine appears to be around 1.6-1.7 -Monitor with diuresis -Work-up per nephrology pending -Nephrology is following  7.  Frequent falls: -Patient's daughter indicates the patient has fallen approximately twice per week dating back to 04/2019 in the setting of generalized weakness and progressive frail state -Most recent fall on 8/8 in which the patient struck her head on an aquarium table -CT head this admission nonacute with incidentally noted remote CVA -Eliquis held given downtrending hemoglobin, may need to  consider discontinuation of [place] altogether with her comorbid conditions -Recommend PT/OT evaluation  8.  Symptomatic bradycardia: -Status post PPM with His bundle upgrade in 10/2017 -Followed by EP  9.  HTN:  -Blood pressure stable -Continue current medications as outlined above along with PTA amlodipine, which can be held if needed for BP room to diurese  10.  Multifocal pneumonia/COPD exacerbation/tobacco use: -Management per primary service -Complete cessation of tobacco is recommended   For questions or updates, please contact [place] Please consult [email or web address] for contact info under Cardiology/STEMI.    Signed, Christell Faith, PA-C [place] Pager: [phone] 28-Oct-2019, 8:43 AM

## 2019-11-01 NOTE — Progress Notes (Signed)
Daily Progress Note   Patient Name: Wanda Brooks       Date: 10-12-2019 DOB: [birth year]  Age: 84 y.o. MRN#: [id number] Attending Physician: Barb Merino, MD Primary Care Physician: Jearld Fenton, NP Admit Date: 10/06/2019  Reason for Consultation/Follow-up: Establishing goals of care  Subjective: Patient drowsy but will wake to voice. Appears uncomfortable on BiPAP with tachypnea and accessory muscle use. Oxygen saturations 80's.  GOC:  AM: Daughter, Jackie at bedside. She just spoke with Dr. Sloan Leiter and aware of CXR results and worsening clinical status with oxygen desaturation on BiPAP. Just given morphine x1 with minimal relief. [name] speaks of wishing for her mother to be comfortable. She does not want to wait for other family members and considering removal of BiPAP this afternoon since her mother is asking for water. [name] agrees with discontinuation of pulmonology referral, feeling there will not be additional recommendations at this point. Confirmed DNR/DNI. Reassured [name] of f/u this afternoon.   PM: F/u with daughter at bedside. Janine remains uncomfortable with tachypnea and accessory muscle. Morphine 2mg  IV and Ativan 0.5mg  IV given x1. [name] is ready to take Christeena off BiPAP. Placed 12L [place]. Patient accepting sips of water. Patient remains uncomfortable with tachypnea and accessory muscle use. Notified RN to give Morphine 4mg  IV x1.   Discussed transition to comfort measures and discontinuation of interventions/medications not aimed at comfort. [name] understands and agrees. Discussed unrestricted visitor access and comfort medications.   Despite a total of 8mg  IV Morphine given this after, patient remains uncomfortable. [name] is agreeable with starting continuous infusion for  comfort. Will switch to dilaudid as this may provide Kaedynce more comfort with elevated creatinine. Orders placed. [name] understands poor prognosis and anticipation that her mother will pass in the hospital.   Answered questions and emotional/spiritual support provided. [name] has PMT contact information.    Length of Stay: 2  Current Medications: Scheduled Meds:  . budesonide (PULMICORT) nebulizer solution  0.25 mg Nebulization BID  . buPROPion  75 mg Oral BID  . hydrALAZINE  25 mg Oral TID  . isosorbide mononitrate  120 mg Oral Daily  . metoprolol succinate  100 mg Oral Daily  . oxybutynin  10 mg Oral QHS  . sodium chloride flush  3 mL Intravenous Q12H    Continuous Infusions: . sodium  chloride    . azithromycin Stopped (10-31-2019 1048)    PRN Meds: sodium chloride, acetaminophen, acetaminophen, fluticasone, glycopyrrolate, HYDROcodone-acetaminophen, ipratropium-albuterol, LORazepam, morphine injection, ondansetron (ZOFRAN) IV, sodium chloride flush  Physical Exam Vitals and nursing note reviewed.  Constitutional:      Appearance: She is cachectic. She is ill-appearing.  Cardiovascular:     Rate and Rhythm: Rhythm irregularly irregular.  Pulmonary:     Effort: Tachypnea, accessory muscle usage and respiratory distress present.     Breath sounds: Wheezing present.     Comments: BiPAP Skin:    General: Skin is warm and dry.     Coloration: Skin is pale.     Findings: Ecchymosis present.  Neurological:     Mental Status: She is easily aroused.     Comments: drowsy            Vital Signs: BP (!) 136/28   Pulse (!) 59   Temp 97.9 F (36.6 C) (Oral)   Resp (!) 42   Ht 5\' 4"  (1.626 m)   Wt 55.6 kg   SpO2 97%   BMI 21.04 kg/m  SpO2: SpO2: 97 % O2 Device: O2 Device: High Flow Nasal Cannula O2 Flow Rate: O2 Flow Rate (L/min): 15 L/min  Intake/output summary:  No intake or output data in the 24 hours ending 10/31/2019 1344 LBM:   Baseline Weight: Weight: 55.6 kg Most  recent weight: Weight: 55.6 kg       Palliative Assessment/Data: PPS 20%    Flowsheet Rows     Most Recent Value  Intake Tab  Referral Department Hospitalist  Unit at Time of Referral ER  Palliative Care Primary Diagnosis Other (Comment)  [CHF, COPD, pneumonia, adult failure to thrive]  Palliative Care Type New Palliative care  Reason for referral Clarify Goals of Care  Date first seen by Palliative Care 10/10/19  Clinical Assessment  Palliative Performance Scale Score 20%  Psychosocial & Spiritual Assessment  Palliative Care Outcomes  Patient/Family meeting held? Yes  Who was at the meeting? daughter  Palliative Care Outcomes Clarified goals of care, Provided end of life care assistance, Provided psychosocial or spiritual support, Improved pain interventions, Improved non-pain symptom therapy      Patient Active Problem List   Diagnosis Date Noted  . Multifocal pneumonia   . Chronic kidney disease, stage IV (severe) ([place])   . Palliative care by specialist   . Terminal care   . Acute CHF (congestive heart failure) ([place]) 10/22/2019  . Acute on chronic combined systolic (congestive) and diastolic (congestive) heart failure ([place]) 10/08/2019  . Respiratory failure, acute ([place]) 10/16/2019  . Acute respiratory failure ([place]) 10/02/2019  . Abrasion 10/03/2019  . Laceration of multiple sites of arm 10/03/2019  . Elbow injury, initial encounter 10/03/2019  . Dyspnea 04/11/2019  . OAB (overactive bladder) 10/06/2018  . OA (osteoarthritis) of hip 04/06/2018  . History of anemia due to chronic kidney disease   . CKD (chronic kidney disease), stage III ([place])   . Chronic atrial fibrillation ([place]) 12/16/2015  . Type 2 diabetes mellitus with stage 4 chronic kidney disease ([place]) 12/16/2015  . Essential hypertension 12/16/2015  . Pure hypercholesterolemia 12/16/2015  . Gastroesophageal reflux disease 12/16/2015  . Coronary artery disease involving coronary bypass graft of native heart  without angina pectoris 12/16/2015  . COPD (chronic obstructive pulmonary disease) ([place]) 12/16/2015  . Chronic congestive heart failure ([place]) 12/16/2015  . Slow transit constipation 12/16/2015  . Anxiety and depression 12/16/2015    Palliative Care  Assessment & Plan   Patient Profile: 84 y.o. female  with past medical history of CAD s/p CABG in 2006, ischemic cardiomyopathy, a/c combined heart failure, persistent atrial fibrillation on Eliquis, symptomatic bradycardia s/p pacemaker in 2013, COPD, smoker, DM, HTN, CKD stage IV, anxiety, depression, frequent falls admitted on 10/03/2019 with shortness of breath. CXR revealed cardiomegaly with diffuse interstitial and airspace disease and bilateral effusions. Findings consistent with congestive heart failure and multi lobar pneumonia. Receiving medical management. Initially on BiPAP then NRB, now 8/10 on HFNC. Cardiology following. Diuresing. Patient is not a good candidate for ischemic cardiac evaluation with age and comorbidities. Nephrology also consulted for CKD stage IV. Palliative medicine consultation for goals of care.   Assessment: Acute hypoxic respiratory failure, multifactorial Acute on chronic combine heart failure EF 35-40% Multilobar pneumonia COPD Chronic atrial fibrillation DM AKI on CKD stage IV Anemia of chronic disease  Recommendations/Plan:  Clinical decline. DNR/DNI  Daughter ready for transition to comfort measures, understanding diagnoses and poor prognosis. Discontinued interventions not aimed at comfort.   Discontinued BiPAP. Placed back on 12L [place].  Symptom management:  Despite multiple prn doses of morphine, patient remains uncomfortable with tachypnea and accessory muscle use.  Initiate Dilaudid 0.5mg  IV continuous infusion for comfort  RN may bolus Dilaudid 0.5-1mg  q2min prn pain/dyspnea/air hunger/tachypnea  Ativan 0.5mg  IV q4h prn anxiety  Robinul 0.2mg  IV q4h prn secretions  Tylenol PR prn  fever  Dulcolax PR prn constipation  Comfort feeds/sips per patient/family request  Continue frequent oral care and repositioning  Unrestricted visitor access as patient is nearing EOL.  Anticipate hospital death within [place].   Code Status: DNR/DNI   Code Status Orders  (From admission, onward)         Start     Ordered   10/17/2019 0806  Do not attempt resuscitation (DNR)  Continuous       Question Answer Comment  In the event of cardiac or respiratory ARREST Do not call a "code blue"   In the event of cardiac or respiratory ARREST Do not perform Intubation, CPR, defibrillation or ACLS   In the event of cardiac or respiratory ARREST Use medication by any route, position, wound care, and other measures to relive pain and suffering. May use oxygen, suction and manual treatment of airway obstruction as needed for comfort.   Comments Discussed code status with patient's daughter who wants her to be a DNR      10/26/2019 0809        Code Status History    Date Active Date Inactive Code Status Order ID Comments User Context   12/08/2017 1642 12/09/2017 1621 Full Code [id number]  Deboraha Sprang, MD Inpatient   11/12/2017 2137 11/15/2017 1945 Full Code [id number]  Dustin Flock, MD Inpatient   07/06/2017 1444 07/12/2017 2044 Full Code [id number]  Norman Herrlich Inpatient   06/22/2017 0846 06/24/2017 1734 Full Code [id number]  Kayleen Memos, DO Inpatient   01/11/2017 1445 01/13/2017 1436 Full Code [id number]  Fransico Meadow, PA-C ED   10/12/2016 1812 10/14/2016 2245 Full Code [id number]  Domenic Polite, MD Inpatient   10/10/2016 0004 10/12/2016 1641 Full Code [id number]  Harrie Foreman, MD Inpatient   06/27/2016 1042 06/28/2016 1600 Full Code [id number]  Fritzi Mandes, MD Inpatient   01/07/2016 1555 01/08/2016 1857 Full Code [id number]  Norman Herrlich Inpatient   12/30/2015 2331 01/03/2016 1929 Full Code [id number]  Ivor Costa, MD Inpatient   10/31/2014 1855 11/01/2014 2209 Full Code  [id number]  Iran Planas, MD Inpatient   Advance Care Planning Activity       Prognosis:   Hours-days  Discharge Planning:  Anticipated Hospital Death  Care plan was discussed with RN, daughter, Dr. Sloan Leiter  Thank you for allowing the Palliative Medicine Team to assist in the care of this patient.   Total Time 60 Prolonged Time Billed  no      Greater than 50%  of this time was spent counseling and coordinating care related to the above assessment and plan.  Ihor Dow, DNP, FNP-C Palliative Medicine Team  Phone: [phone] Fax: [phone]  Please contact Palliative Medicine Team phone at [phone] for questions and concerns.

## 2019-11-01 NOTE — Progress Notes (Signed)
Pt expired at 19:10. Pronounced by Rowe Robert, RN and Mariane Baumgarten, RN. Family was at the bedside when pt expired. Comfort provided. Shearon Balo and NP Sharion Settler notified. [name] will call [facility].
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
## 2019-11-01 NOTE — Progress Notes (Signed)
[facility]  ROUNDING NOTE   Subjective:   Daughter at bedside.   Patient decompensated and now requiring noninvasive ventilation.  Labs pending this morning  Objective:  Vital signs in last 24 hours:  Pulse Rate:  [58-84] 59 (08/11 0730) Resp:  [16-34] 22 (08/11 0730) BP: (98-146)/(39-83) 142/48 (08/11 0730) SpO2:  [78 %-97 %] 97 % (08/11 0906)  Weight change:  Filed Weights   10/27/2019 0529  Weight: 55.6 kg    Intake/Output: I/O last 3 completed shifts: In: 222.1 [IV Piggyback:222.1] Out: 900 [Urine:900]   Intake/Output this shift:  No intake/output data recorded.  Physical Exam: General: Critically ill  Head: +BIPAP  Eyes: Anicteric, PERRL  Neck: Supple, trachea midline  Lungs:  Bilateral wheezes, right sided rhonchi  Heart: Regular rate and rhythm  Abdomen:  Soft, nontender,   Extremities:  no peripheral edema.  Neurologic: Following commands  Skin: No lesions        Basic Metabolic Panel: Recent Labs  Lab 10/03/2019 0535 10/18/2019 1939 10/10/19 0610  NA 143  --  143  K 4.6  --  4.0  CL 112*  --  111  CO2 19*  --  19*  GLUCOSE 182*  --  120*  BUN 48*  --  57*  CREATININE 1.91*  --  1.91*  CALCIUM 8.8*  --  8.3*  PHOS  --  4.8*  --     Liver Function Tests: Recent Labs  Lab 10/10/2019 0535  AST 24  ALT 13  ALKPHOS 58  BILITOT 0.9  PROT 7.5  ALBUMIN 3.1*   No results for input(s): LIPASE, AMYLASE in the last 168 hours. No results for input(s): AMMONIA in the last 168 hours.  CBC: Recent Labs  Lab 10/06/2019 0535 10/10/19 0815 2019/10/20 0516  WBC 21.6* 19.9* 22.0*  NEUTROABS 17.9* 17.3* 20.7*  HGB 9.9* 8.1* 8.1*  HCT 30.4* 24.9* 25.4*  MCV 92.4 94.7 95.5  PLT 550* 452* 447*    Cardiac Enzymes: No results for input(s): CKTOTAL, CKMB, CKMBINDEX, TROPONINI in the last 168 hours.  BNP: Invalid input(s): POCBNP  CBG: Recent Labs  Lab 10/10/19 0756 10/10/19 1232 10/10/19 1858 10/10/19 2256 10-20-19 0801  GLUCAP  119* 124* 108* 146* 175*    Microbiology: Results for orders placed or performed during the hospital encounter of 10/30/2019  Blood culture (routine x 2)     Status: None (Preliminary result)   Collection Time: 10/31/2019  5:34 AM   Specimen: BLOOD  Result Value Ref Range Status   Specimen Description BLOOD BLOOD LEFT WRIST  Final   Special Requests   Final    BOTTLES DRAWN AEROBIC AND ANAEROBIC Blood Culture adequate volume   Culture   Final    NO GROWTH 2 DAYS Performed at [facility], [address], [place], [place] [address]    Report Status PENDING  Incomplete  Blood culture (routine x 2)     Status: None (Preliminary result)   Collection Time: 10/31/2019  5:34 AM   Specimen: BLOOD  Result Value Ref Range Status   Specimen Description BLOOD LEFT ANTECUBITAL  Final   Special Requests   Final    BOTTLES DRAWN AEROBIC AND ANAEROBIC Blood Culture adequate volume   Culture   Final    NO GROWTH 2 DAYS Performed at [facility], [address] [place], [place] [address]    Report Status PENDING  Incomplete  SARS Coronavirus 2 by RT PCR (hospital order, performed in [place]  hospital lab) Nasopharyngeal Nasopharyngeal Swab     Status: None   Collection Time: 10/30/2019  5:35 AM   Specimen: Nasopharyngeal Swab  Result Value Ref Range Status   SARS Coronavirus 2 NEGATIVE NEGATIVE Final    Comment: (NOTE) SARS-CoV-2 target nucleic acids are NOT DETECTED.  The SARS-CoV-2 RNA is generally detectable in upper and lower respiratory specimens during the acute phase of infection. The lowest concentration of SARS-CoV-2 viral copies this assay can detect is 250 copies / mL. A negative result does not preclude SARS-CoV-2 infection and should not be used as the sole basis for treatment or other patient management decisions.  A negative result may occur with improper specimen collection / handling, submission of specimen other than nasopharyngeal swab, presence of viral  mutation(s) within the areas targeted by this assay, and inadequate number of viral copies (<250 copies / mL). A negative result must be combined with clinical observations, patient history, and epidemiological information.  Fact Sheet for Patients:   StrictlyIdeas.no  Fact Sheet for Healthcare Providers: BankingDealers.co.za  This test is not yet approved or  cleared by the [place] FDA and has been authorized for detection and/or diagnosis of SARS-CoV-2 by FDA under an Emergency Use Authorization (EUA).  This EUA will remain in effect (meaning this test can be used) for the duration of the COVID-19 declaration under Section 564(b)(1) of the Act, 21 U.S.C. section 360bbb-3(b)(1), unless the authorization is terminated or revoked sooner.  Performed at [facility], [address] [place], [place] [address]     Coagulation Studies: No results for input(s): LABPROT, INR in the last 72 hours.  Urinalysis: Recent Labs    10/17/2019 1939  COLORURINE YELLOW*  LABSPEC 1.014  PHURINE 5.0  GLUCOSEU NEGATIVE  HGBUR NEGATIVE  BILIRUBINUR NEGATIVE  KETONESUR NEGATIVE  PROTEINUR 30*  NITRITE NEGATIVE  LEUKOCYTESUR NEGATIVE      Imaging: CT HEAD WO CONTRAST  Result Date: 10/15/2019 CLINICAL DATA:  Altered mental status. EXAM: CT HEAD WITHOUT CONTRAST TECHNIQUE: Contiguous axial images were obtained from the base of the skull through the vertex without intravenous contrast. COMPARISON:  None. FINDINGS: Brain: No hemorrhage. No extraaxial collection.No midline shift. There is atrophy.The basal cisterns are unremarkable. Patchy and confluent areas of decreased attenuation are noted throughout the deep and periventricular white matter of the cerebral hemispheres bilaterally, compatible with chronic microvascular ischemic disease. There are areas of encephalomalacia involving the bilateral occipital lobes. The brainstem is  unremarkable. The cerebellum is unremarkable. The sella is unremarkable. Vascular: No hyperdense vessel or unexpected calcification. Skull: The calvarium is unremarkable. The skull base is unremarkable. The visualized upper cervical spine is unremarkable. Sinuses/Orbits: There is opacification of the sphenoid sinuses. The remaining paranasal sinuses are unremarkable. The mastoid air cells are clear. Other: The visualized parotid gland is unremarkable. There is no scalp soft tissue swelling. IMPRESSION: 1. No acute intracranial abnormality. 2. Atrophy and advanced chronic microvascular ischemic disease. 3. Remote bilateral occipital lobe infarcts. 4. Sphenoid sinus disease. Electronically Signed   By: Constance Holster M.D.   On: 10/13/2019 16:21   US RENAL  Result Date: 10/16/2019 CLINICAL DATA:  Chronic renal disease. EXAM: RENAL / URINARY TRACT ULTRASOUND COMPLETE COMPARISON:  Renal ultrasound 01/02/2016. FINDINGS: Right Kidney: Renal measurements: 9.6 x 4.4 x 5.2 cm = volume: 115 mL . Echogenicity is increased. No mass or hydronephrosis visualized. Left Kidney: Renal measurements: 9.1 x 5.0 x 4.7 cm = volume: 110 mL. Echogenicity is increased. No solid mass or hydronephrosis visualized.  Three simple cysts are identified. The largest is in upper pole and measures 3.7 cm in diameter. Bladder: Appears normal for degree of bladder distention. Other: None. IMPRESSION: Negative for hydronephrosis or other acute abnormality. Increased cortical echogenicity suggestive of medical renal disease. Three simple left renal cysts. Electronically Signed   By: Inge Rise M.D.   On: 10/21/2019 12:31   ECHOCARDIOGRAM COMPLETE  Result Date: 10/20/2019    ECHOCARDIOGRAM REPORT   Patient Name:   Wanda Brooks Date of Exam: 10/19/2019 Medical Rec #:  [id number]     Height:       64.0 in Accession #:    [id number]    Weight:       122.6 lb Date of Birth:  1936/02/02     BSA:          1.589 m Patient Age:    54 years      BP:            163/58 mmHg Patient Gender: F             HR:           60 bpm. Exam Location:  [facility] Procedure: 2D Echo, Cardiac Doppler and Color Doppler Indications:     CHF- acute systolic 017.79  History:         Patient has prior history of Echocardiogram examinations, most                  recent 05/04/2018. CHF, CAD, Pacemaker, COPD, Arrythmias:Atrial                  Fibrillation; Risk Factors:Hypertension and Diabetes.  Sonographer:     Sherrie Sport RDCS (AE) Referring Phys:  [id number] Collier Bullock Diagnosing Phys: Serafina Royals MD  Sonographer Comments: Suboptimal apical window. IMPRESSIONS  1. Left ventricular ejection fraction, by estimation, is 45 to 50%. The left ventricle has mildly decreased function. The left ventricle demonstrates regional wall motion abnormalities (see scoring diagram/findings for description). Left ventricular diastolic parameters were normal.  2. Right ventricular systolic function is normal. The right ventricular size is normal. There is severely elevated pulmonary artery systolic pressure.  3. Left atrial size was mild to moderately dilated.  4. Right atrial size was mildly dilated.  5. The mitral valve is normal in structure. Moderate mitral valve regurgitation.  6. The aortic valve is normal in structure. Aortic valve regurgitation is trivial. FINDINGS  Left Ventricle: Left ventricular ejection fraction, by estimation, is 45 to 50%. The left ventricle has mildly decreased function. The left ventricle demonstrates regional wall motion abnormalities. Moderate hypokinesis of the left ventricular, entire septal wall. The left ventricular internal cavity size was normal in size. There is no left ventricular hypertrophy. Left ventricular diastolic parameters were normal. Right Ventricle: The right ventricular size is normal. No increase in right ventricular wall thickness. Right ventricular systolic function is normal. There is severely elevated pulmonary artery systolic pressure. The  tricuspid regurgitant velocity is 3.66 m/s, and with an assumed right atrial pressure of 10 mmHg, the estimated right ventricular systolic pressure is 92.3 mmHg. Left Atrium: Left atrial size was mild to moderately dilated. Right Atrium: Right atrial size was mildly dilated. Pericardium: There is no evidence of pericardial effusion. Mitral Valve: The mitral valve is normal in structure. Moderate mitral valve regurgitation. Tricuspid Valve: The tricuspid valve is normal in structure. Tricuspid valve regurgitation is mild. Aortic Valve: The aortic valve is normal in structure. Aortic valve regurgitation is trivial.  Aortic valve mean gradient measures 3.0 mmHg. Aortic valve peak gradient measures 4.9 mmHg. Aortic valve area, by VTI measures 2.39 cm. Pulmonic Valve: The pulmonic valve was normal in structure. Pulmonic valve regurgitation is not visualized. Aorta: The aortic root and ascending aorta are structurally normal, with no evidence of dilitation. IAS/Shunts: No atrial level shunt detected by color flow Doppler.  LEFT VENTRICLE PLAX 2D LVIDd:         4.93 cm  Diastology LVIDs:         3.79 cm  LV e' lateral:   5.98 cm/s LV PW:         1.60 cm  LV E/e' lateral: 17.2 LV IVS:        1.44 cm  LV e' medial:    5.00 cm/s LVOT diam:     2.00 cm  LV E/e' medial:  20.6 LV SV:         52 LV SV Index:   32 LVOT Area:     3.14 cm  RIGHT VENTRICLE RV Basal diam:  3.80 cm RV S prime:     7.62 cm/s TAPSE (M-mode): 2.4 cm LEFT ATRIUM             Index       RIGHT ATRIUM           Index LA diam:        5.50 cm 3.46 cm/m  RA Area:     22.10 cm LA Vol (A2C):   59.2 ml 37.26 ml/m RA Volume:   66.60 ml  41.92 ml/m LA Vol (A4C):   66.8 ml 42.04 ml/m LA Biplane Vol: 64.8 ml 40.78 ml/m  AORTIC VALVE AV Area (Vmax):    1.76 cm AV Area (Vmean):   1.58 cm AV Area (VTI):     2.39 cm AV Vmax:           110.67 cm/s AV Vmean:          75.200 cm/s AV VTI:            0.216 m AV Peak Grad:      4.9 mmHg AV Mean Grad:      3.0 mmHg LVOT  Vmax:         62.10 cm/s LVOT Vmean:        37.800 cm/s LVOT VTI:          0.164 m LVOT/AV VTI ratio: 0.76  AORTA Ao Root diam: 2.80 cm MITRAL VALVE                TRICUSPID VALVE MV Area (PHT): 3.42 cm     TR Peak grad:   53.6 mmHg MV Decel Time: 222 msec     TR Vmax:        366.00 cm/s MV E velocity: 103.00 cm/s MV A velocity: 50.00 cm/s   SHUNTS MV E/A ratio:  2.06         Systemic VTI:  0.16 m                             Systemic Diam: 2.00 cm Serafina Royals MD Electronically signed by Serafina Royals MD Signature Date/Time: 10/16/2019/3:35:33 PM    Final      Medications:   . sodium chloride    . azithromycin 500 mg (October 22, 2019 0848)   . amiodarone  200 mg Oral Daily  . amLODipine  10 mg Oral Q breakfast  . atorvastatin  20 mg Oral q1800  . budesonide (PULMICORT) nebulizer solution  0.25 mg Nebulization BID  . buPROPion  75 mg Oral BID  . cholecalciferol  1,000 Units Oral Daily  . cyanocobalamin  1,000 mcg Intramuscular Daily  . docusate sodium  100 mg Oral BID  . ferrous sulfate  325 mg Oral Q breakfast  . heparin  5,000 Units Subcutaneous Q8H  . hydrALAZINE  25 mg Oral TID  . insulin aspart  0-9 Units Subcutaneous TID WC  . isosorbide mononitrate  120 mg Oral Daily  . metoprolol succinate  100 mg Oral Daily  . oxybutynin  10 mg Oral QHS  . pantoprazole  40 mg Oral Daily  . polyethylene glycol  17 g Oral Daily  . potassium chloride SA  20 mEq Oral QODAY  . sodium chloride flush  3 mL Intravenous Q12H   sodium chloride, acetaminophen, acetaminophen, fluticasone, HYDROcodone-acetaminophen, ipratropium-albuterol, LORazepam, ondansetron (ZOFRAN) IV, sodium chloride flush  Assessment/ Plan:  Ms. Wanda Brooks is a 84 y.o. white female with COPD, atrial fibrillation, diastolic congestive heart failure, carotid artery disease status post bilateral CEA, coronary artery disease status post CABG, diabetes mellitus type II, GERD, hypertension, depression, anxiety, who was admitted to [facility] on  10/08/2019 for Acute CHF (congestive heart failure) ([place]) [I50.9] Acute on chronic combined systolic (congestive) and diastolic (congestive) heart failure ([place]) [I50.43]  1. Acute renal failure on chronic kidney disease stage IV with proteinuria: baseline creatinine of 1.71, GFR of 28 on 07/25/19 Chronic kidney disease of unclear etiology Acute renal failure secondary to concurrent disease of COPD and acute cardiorenal syndrome.  Not on an ACE-I/ARB at home.  Ultrasound without obstruction.   2. Acute exacerbation of diastolic congestive heart failure with hypertension and history of atrial fibrillation on apixaban for anticoagulation.  Home regimen of furosemide, isosorbide mononitrate, metoprolol, amlodipine and hydralazine.  - discontinue furosemide.   3. Hypokalemia: - PO potassium   4. Anemia with chronic kidney disease and iron deficiency: normocytic. Hemoglobin 8.1.   5. Acute respiratory failure secondary to acute exacerbation of COPD and bilateral multilobar pneumonia: requiring noninvasive ventilation and high dose oxygen.   - Empiric azithromycin - methylprednisone - nebulizers - Consult pulmonology.    LOS: 2 Jay Kempe 11/03/2108:09 AM

## 2019-11-01 DEATH — deceased

## 2020-03-11 IMAGING — DX DG CHEST 1V PORT
1 series · 1 of 1 positions shown · non-contrast
Comparison: 09/15/2017

CLINICAL DATA: Worsening shortness of breath 3 days.  CHF.

EXAM:
PORTABLE CHEST 1 VIEW

[chest ap]
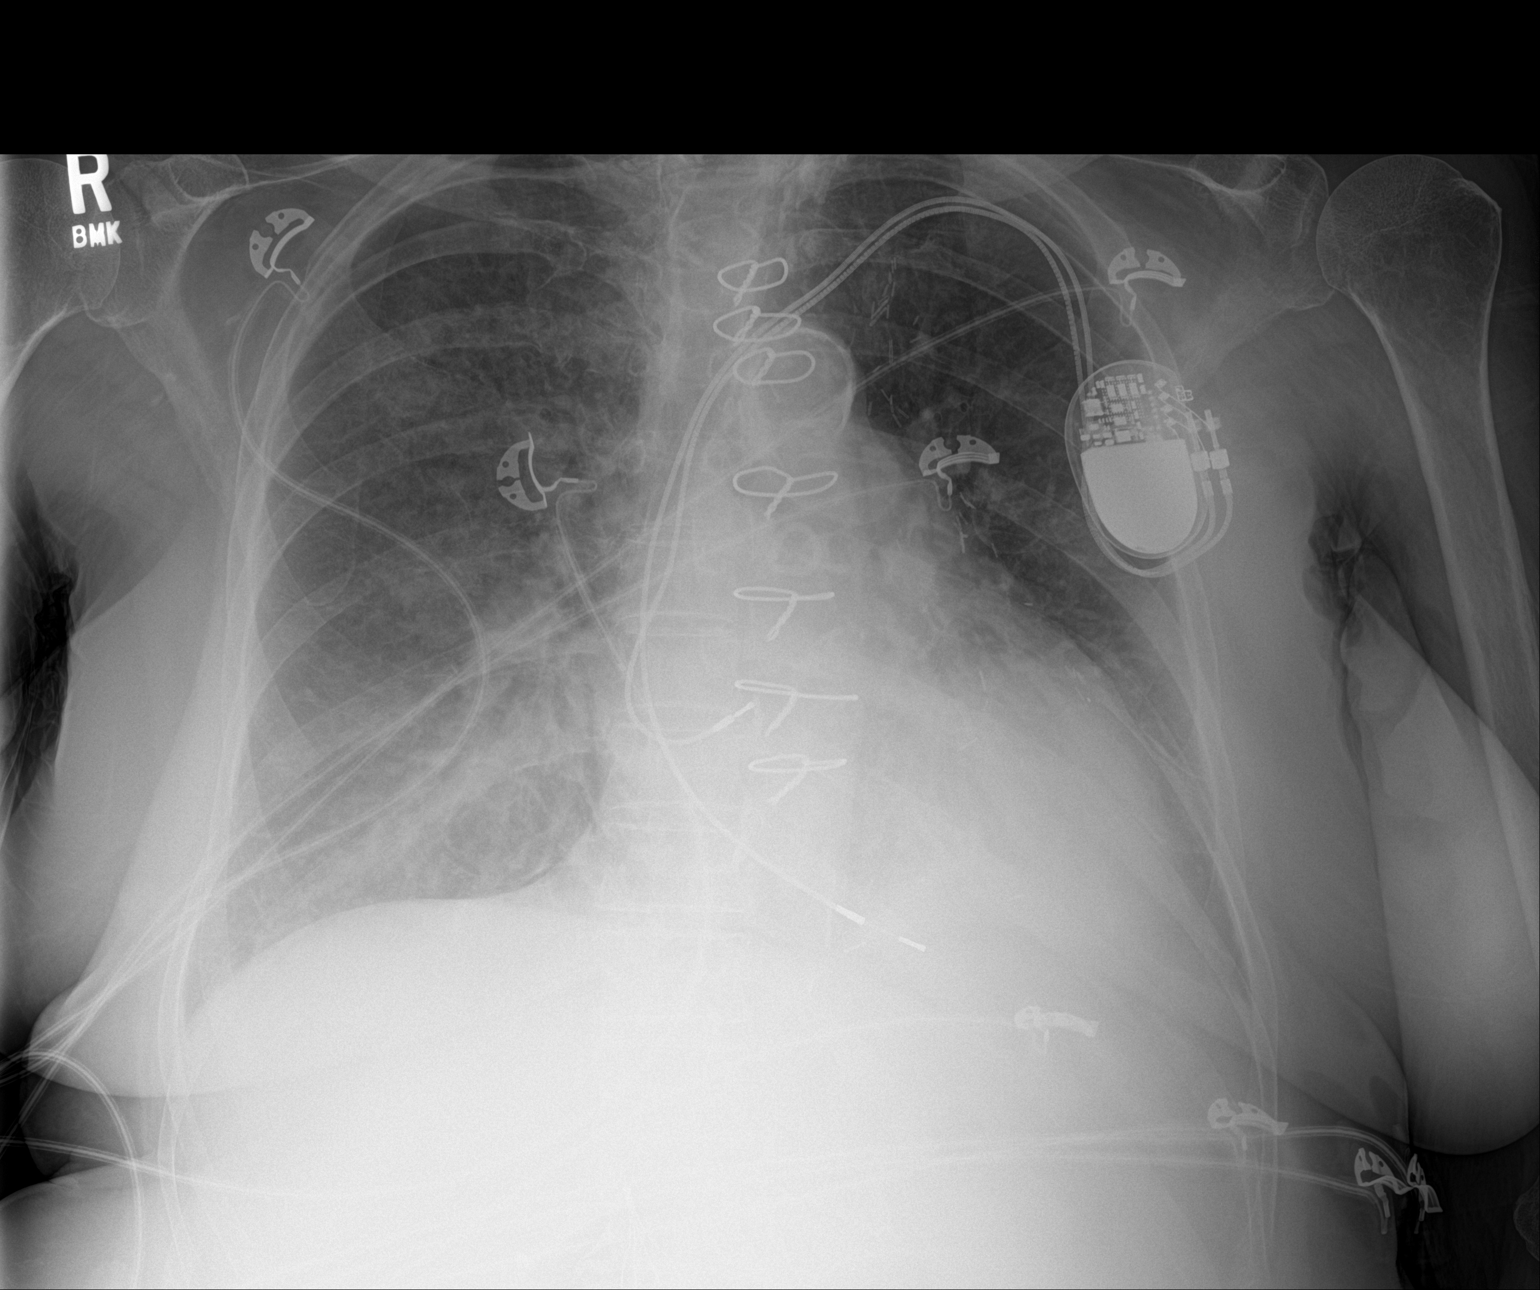

[1 of 1 positions shown; findings below may reference images not displayed]

FINDINGS: Left-sided pacemaker and sternotomy wires unchanged. Lungs are
adequately inflated with hazy prominence of the perihilar/bibasilar
markings suggesting mild interstitial edema. Mild increased left
retrocardiac opacification as cannot exclude infection. Stable
cardiomegaly. Remainder of the exam is unchanged.
IMPRESSION: Hazy prominence of the perihilar bibasilar markings likely due to
mild interstitial edema. Mild opacification the left retrocardiac
region which may be due to infection.
